# Patient Record
Sex: Male | Born: 1935
Health system: Southern US, Community
[De-identification: ages and names within clinical notes are randomized; demographics above are authoritative.]

## PROBLEM LIST (undated history)

## (undated) DIAGNOSIS — H9190 Unspecified hearing loss, unspecified ear: Secondary | ICD-10-CM

## (undated) DIAGNOSIS — K219 Gastro-esophageal reflux disease without esophagitis: Secondary | ICD-10-CM

## (undated) DIAGNOSIS — I10 Essential (primary) hypertension: Secondary | ICD-10-CM

## (undated) DIAGNOSIS — E7800 Pure hypercholesterolemia, unspecified: Secondary | ICD-10-CM

## (undated) DIAGNOSIS — R0602 Shortness of breath: Secondary | ICD-10-CM

## (undated) DIAGNOSIS — C61 Malignant neoplasm of prostate: Secondary | ICD-10-CM

## (undated) DIAGNOSIS — R42 Dizziness and giddiness: Secondary | ICD-10-CM

## (undated) DIAGNOSIS — I4891 Unspecified atrial fibrillation: Secondary | ICD-10-CM

## (undated) DIAGNOSIS — R0600 Dyspnea, unspecified: Secondary | ICD-10-CM

## (undated) DIAGNOSIS — K2270 Barrett's esophagus without dysplasia: Secondary | ICD-10-CM

## (undated) DIAGNOSIS — Z950 Presence of cardiac pacemaker: Secondary | ICD-10-CM

## (undated) HISTORY — DX: Dizziness and giddiness: R42

## (undated) HISTORY — DX: Unspecified atrial fibrillation: I48.91

## (undated) HISTORY — PX: INGUINAL HERNIA REPAIR: SUR1180

## (undated) HISTORY — DX: Unspecified hearing loss, unspecified ear: H91.90

## (undated) HISTORY — PX: CHOLECYSTECTOMY: SHX55

## (undated) HISTORY — DX: Dyspnea, unspecified: R06.00

## (undated) HISTORY — DX: Essential (primary) hypertension: I10

## (undated) HISTORY — DX: Gastro-esophageal reflux disease without esophagitis: K21.9

## (undated) HISTORY — DX: Barrett's esophagus without dysplasia: K22.70

## (undated) HISTORY — PX: PROSTATE BIOPSY: SHX241

## (undated) HISTORY — DX: Shortness of breath: R06.02

---

## 2000-09-11 DIAGNOSIS — C61 Malignant neoplasm of prostate: Secondary | ICD-10-CM

## 2000-09-11 HISTORY — DX: Malignant neoplasm of prostate: C61

## 2017-01-17 DIAGNOSIS — C61 Malignant neoplasm of prostate: Secondary | ICD-10-CM | POA: Diagnosis not present

## 2017-01-17 DIAGNOSIS — K4090 Unilateral inguinal hernia, without obstruction or gangrene, not specified as recurrent: Secondary | ICD-10-CM | POA: Diagnosis not present

## 2017-01-17 DIAGNOSIS — I10 Essential (primary) hypertension: Secondary | ICD-10-CM | POA: Diagnosis not present

## 2017-06-25 DIAGNOSIS — I10 Essential (primary) hypertension: Secondary | ICD-10-CM | POA: Diagnosis not present

## 2017-06-25 DIAGNOSIS — C61 Malignant neoplasm of prostate: Secondary | ICD-10-CM | POA: Diagnosis not present

## 2017-06-25 DIAGNOSIS — Z23 Encounter for immunization: Secondary | ICD-10-CM | POA: Diagnosis not present

## 2017-07-31 DIAGNOSIS — B029 Zoster without complications: Secondary | ICD-10-CM | POA: Diagnosis not present

## 2017-07-31 DIAGNOSIS — R21 Rash and other nonspecific skin eruption: Secondary | ICD-10-CM | POA: Diagnosis not present

## 2017-09-25 DIAGNOSIS — K219 Gastro-esophageal reflux disease without esophagitis: Secondary | ICD-10-CM | POA: Diagnosis not present

## 2017-09-25 DIAGNOSIS — Z7982 Long term (current) use of aspirin: Secondary | ICD-10-CM | POA: Diagnosis not present

## 2017-09-25 DIAGNOSIS — I517 Cardiomegaly: Secondary | ICD-10-CM | POA: Diagnosis not present

## 2017-09-25 DIAGNOSIS — I481 Persistent atrial fibrillation: Secondary | ICD-10-CM | POA: Diagnosis not present

## 2017-09-25 DIAGNOSIS — I10 Essential (primary) hypertension: Secondary | ICD-10-CM | POA: Diagnosis not present

## 2017-09-25 DIAGNOSIS — R0600 Dyspnea, unspecified: Secondary | ICD-10-CM | POA: Diagnosis not present

## 2017-09-25 DIAGNOSIS — Z79899 Other long term (current) drug therapy: Secondary | ICD-10-CM | POA: Diagnosis not present

## 2017-09-26 ENCOUNTER — Telehealth: Payer: Self-pay | Admitting: *Deleted

## 2017-09-26 NOTE — Telephone Encounter (Signed)
Pt's daughter called in today, [name], daughter sees Cecille Rubin, and explained that pt's father comes to live with daughter in the winter from [place] to [place]. Daughter travels in [place] with Husband and teaches ministry, read [employer] notes.  Father did not feel good and went to ER in [place] bp was 140/101 and HR 143.  Performed EKG and stated to have irregular HB and was started on Eliquis.Pt was to follow up with cardiology in one week.   Pt's daughter wanted pt to see Cecille Rubin next week, stated have no openings and did I suggest another provider and than daughter wanted pt to transition back to [place] after pt was seen next week.  S/w Julaine Hua and explained the situation stated could use two of Scott's slots.  Stated for pt to bring in d/c summary, copy of EKG, and all medications.   Appt made for 60 minutes and verified. Will route to Leisa Lenz, NP, Richardson Dopp, PA-C, Julaine Hua, CMA.

## 2017-09-26 NOTE — Telephone Encounter (Signed)
Ok

## 2017-09-26 NOTE — Telephone Encounter (Signed)
Ok.  Richardson Dopp, PA-C    09/26/2017 4:22 PM

## 2017-10-01 ENCOUNTER — Encounter: Payer: Self-pay | Admitting: *Deleted

## 2017-10-01 ENCOUNTER — Other Ambulatory Visit: Payer: Self-pay | Admitting: *Deleted

## 2017-10-01 NOTE — Progress Notes (Signed)
As I was reviewing my schedule for tomorrow 10/01/17, I saw there was no information entered into the chart for this New Pt.   I called and s/w pt and his daughter in regards to upcoming New Pt appt tomorrow 10/02/17 to help get as much information into the chart as possible before tomorrow's appt to help make the appt go smoother for both the pt and the provider. Pt asked for me to please s/w his daughter [name] who was there with the pt tonight.   I then s/w Baker Janus and gathered information as to the pt's health in the past as well as family hx, medications, etc. Baker Janus states the pt is currently taking TOPROL XL 50 MG BID, ELIQUIS 5 MG BID, PANTOPRAZOLE 40 MG DAILY, VITAMIN D3 2000 UNITS DAILY, RED YEAST RICE 600 MG DAILY. Pt's daughter does state she a copy of the recent EKG done and will bring that. She states pt was in the hospital , [facility] in [place]. I advised that we will get a ROI signed so that we may obtain records to help complete his chart the best we can.   I did confirm the daughters phone # from when she s/w Rainey Pines. CMA 09/26/17, which the chart had [phone], Baker Janus confirmed that her # is [phone]. I have corrected this today. Baker Janus was very thankful for the call today to help gather information to help make appt smoother tomorrow for both them and the provider.

## 2017-10-01 NOTE — Progress Notes (Signed)
Cardiology Office Note:    Date:  10/02/2017   ID:  Ernest Parker, DOB February 19, 1934, MRN [id number]  PCP:  Seward Carol, MD  Cardiologist:   [name], MD   Referring MD: Seward Carol, MD   Chief Complaint  Patient presents with  . Atrial Flutter    History of Present Illness:    Ernest Parker is a 84 y.o. male who is being seen today for the evaluation of Irregular cardiac rhythm and hypertension at the request of Seward Carol, MD.   The patient has a past medical history significant for hypertension, GERD and prostate cancer 2002.He denies any previous cardiac history including MI, heart failure, arrhythmias. He had a normal stress test over 20 years ago. Ernest Parker lives with his daughter in the winter and lives alone in [place] in the summer. His daughter, Ernest Parker, travels in an [place] with her husband and teaches ministry in [place]. She is moving to [place] next week. She is followed by Truitt Merle and requested to have her father seen in our clinic. Last week Ernest Parker was not feeling well and went to an ER in [place]. He was found to have an elevated BP of 140/101 and heart rate of 143. An EKG showed atrial flutter with RVR. He was given Lopressor IV and oral.  His rate was improved and he was discharged from the ED on Toprol 50 mg BID and Eliquis 5 mg BID with plans to follow up soon with cardiology. His symptoms resolved once his rate improved.   Ernest Parker is here today with his daughter. He reports that his symptoms with the episode of aflutter last week were dizziness, weakness, nausea and mild shortness of breath. He had no chest discomfort. He has never had this before. He felt better after treatment at the ED and has had no recurrence of those symptoms since then. His daughter has monitored his heart rate and BP several times a day. In review of her figures it looks like his rhythm likely converted on 10/01/17 as the rate was down from the 110's-130's to the 70's. His BP is  stable.   Ernest Parker is quite active. He plays golf 4-5 times per week in the warmer months. He denies any exertional chest discomfort or dyspnea. He drinks 1-2 cans of beer several times per week especially when he plays golf. He smoked in past, quit in 1980. He denies snoring, daytime sleepiness or previous symptoms of sleep apnea.   Records reviewed from [facility] in [place]. Initial EKG showed a narrow complex tachycardia at 137 bpm fairly regular. Once the rate slowed it revealed atrial flutter.    PAD Screen 10/02/2017  Previous PAD dx? No  Previous surgical procedure? No  Pain with walking? No  Feet/toe relief with dangling? No  Painful, non-healing ulcers? No  Extremities discolored? No      Past Medical History:  Diagnosis Date  . A-fib ([place])   . Cancer [facility]) 2002   prostate  . Dizzy   . Dyspnea   . GERD (gastroesophageal reflux disease)   . Hard of hearing    wears hearing aids bilat  . Hypertension   . SOB (shortness of breath)     Past Surgical History:  Procedure Laterality Date  . INGUINAL HERNIA REPAIR Bilateral     Current Medications: Current Meds  Medication Sig  . apixaban (ELIQUIS) 5 MG TABS tablet Take 1 tablet (5 mg total) by mouth 2 (two) times daily.  Marland Kitchen  aspirin EC 81 MG tablet Take 81 mg by mouth daily.  . Cholecalciferol (VITAMIN D3) 2000 units TABS Take 1 tablet by mouth daily.  . metoprolol succinate (TOPROL-XL) 50 MG 24 hr tablet Take 1 tablet (50 mg total) by mouth 2 (two) times daily. Take with or immediately following a meal.  . pantoprazole (PROTONIX) 40 MG tablet Take 40 mg by mouth daily.  . Red Yeast Rice 600 MG CAPS Take 1 capsule by mouth daily.     Allergies:   Penicillins   Social History   Socioeconomic History  . Marital status: Widowed    Spouse name: None  . Number of children: None  . Years of education: None  . Highest education level: None  Social Needs  . Financial resource strain: None  . Food  insecurity - worry: None  . Food insecurity - inability: None  . Transportation needs - medical: None  . Transportation needs - non-medical: None  Occupational History  . Occupation: RETIRED  Tobacco Use  . Smoking status: Former Smoker    Packs/day: 0.50    Years: 30.00    Pack years: 15.00    Types: Cigarettes    Last attempt to quit: 1980    Years since quitting: 39.0  . Smokeless tobacco: Never Used  Substance and Sexual Activity  . Alcohol use: Yes    Alcohol/week: 1.2 oz    Types: 2 Cans of beer per week    Comment: 2 BEERS on most days  . Drug use: None  . Sexual activity: None  Other Topics Concern  . None  Social History Narrative   Retired Freight forwarder for [employer], Engineer, agricultural. Wife passed away over 5 years ago. Lives alone during the summer in [place] and lives with daughter during the Winter in [place], moving to [place]. Has 1 daughter and 2 sons. One sone lives in [place] and one in [place].   Is an avid golfer, plays 4-5 days per week. Likes to do yardwork.      Family History: The patient's family history includes Hypertension in his mother; Kidney disease in his sister; Sleep apnea in his sister; Tuberculosis in his father. ROS:   Please see the history of present illness.     All other systems reviewed and are negative.  EKGs/Labs/Other Studies Reviewed:    The following studies were reviewed today:  EKG's from OSH  EKG:  EKG is ordered today.  The ekg ordered today demonstrates Sinus bradycardia with occasional PVC or possible PAC with aberancy.   Recent Labs: No results found for requested labs within last 8760 hours.   Recent Lipid Panel No results found for: CHOL, TRIG, HDL, CHOLHDL, VLDL, LDLCALC, LDLDIRECT  Physical Exam:    VS:  BP 112/62 (BP Location: Left Arm, Patient Position: Sitting, Cuff Size: Normal)   Pulse (!) 56   Ht 5\' 10"  (1.778 m)   Wt 229 lb 12.8 oz (104.2 kg)   SpO2 97%   BMI 32.97 kg/m     Wt Readings from Last 3 Encounters:    10/02/17 229 lb 12.8 oz (104.2 kg)     GEN:  Well nourished, well developed in no acute distress HEENT: Normal NECK: No JVD; No carotid bruits LYMPHATICS: No lymphadenopathy CARDIAC: Regularly irregular rhythm, no murmurs, rubs, gallops RESPIRATORY:  Clear to auscultation without rales, wheezing or rhonchi  ABDOMEN: Soft, non-tender, non-distended MUSCULOSKELETAL:  No edema; No deformity  SKIN: Warm and dry NEUROLOGIC:  Alert and oriented x 3 PSYCHIATRIC:  Normal affect   ASSESSMENT:    1. Atrial flutter, paroxysmal ([place])   2. Essential hypertension   3. Paroxysmal atrial fibrillation (HCC)    PLAN:    Pt was seen and examined by myself and discussed with Dr Lovena Le in clinic today. This patient's case was discussed in depth with Dr. Lovena Le. The plan below was formulated per our discussion.  In order of problems listed above:  Atrial flutter: No previous history of arrhythmia or cardiac issues. Developed symptomatic atrial flutter with RVR, seen at ED in [place] 09/25/2017. Started on toprol XL 50 mg bid and Eliquis. EKG now shows sinus bradycardia with PVCs vs PAC with aberrancy.  CHA2DS2/VAS Stroke Risk Score is at least 3 (HTN, Age (2)). On Eliquis 5 mg BID for stroke risk reduction. Will check echocardiogram for LV function, wall motion and valve function.  Plan to follow up with Dr. Lovena Le for EP consult in 3 weeks. Discussed symptoms to report. No evidence of sleep apnea.       HTN: Micardis was stopped to make room for BB to control atrial flutter. BP stable.   Medication Adjustments/Labs and Tests Ordered: Current medicines are reviewed at length with the patient today.  Concerns regarding medicines are outlined above. Labs and tests ordered and medication changes are outlined in the patient instructions below:  Patient Instructions  Medication Instructions:  Your physician recommends that you continue on your current medications as directed. Please refer to the Current  Medication list given to you today.  REFILLS HAVE BEEN SENT IN FOR METOPROLOL AND ELIQUIS IN TO YOUR PHARMACY.  Labwork: TODAY: BMET  Testing/Procedures: Your physician has requested that you have an echocardiogram. Echocardiography is a painless test that uses sound waves to create images of your heart. It provides your doctor with information about the size and Parker of your heart and how well your heart's chambers and valves are working. This procedure takes approximately one hour. There are no restrictions for this procedure.    Follow-Up: Your physician recommends that you schedule a CONSULTATION WITH DR. Lovena Le IN 3 WEEKS   Any Other Special Instructions Will Be Listed Below (If Applicable).     If you need a refill on your cardiac medications before your next appointment, please call your pharmacy.      Signed, Daune Perch, NP  10/02/2017 2:44 PM    [facility]

## 2017-10-01 NOTE — Telephone Encounter (Signed)
I prepped the chart tonight.

## 2017-10-02 ENCOUNTER — Encounter (INDEPENDENT_AMBULATORY_CARE_PROVIDER_SITE_OTHER): Payer: Self-pay

## 2017-10-02 ENCOUNTER — Ambulatory Visit (INDEPENDENT_AMBULATORY_CARE_PROVIDER_SITE_OTHER): Payer: Medicare Other | Admitting: Cardiology

## 2017-10-02 VITALS — BP 112/62 | HR 56 | Ht 70.0 in | Wt 229.8 lb

## 2017-10-02 DIAGNOSIS — I480 Paroxysmal atrial fibrillation: Secondary | ICD-10-CM | POA: Diagnosis not present

## 2017-10-02 DIAGNOSIS — I4892 Unspecified atrial flutter: Secondary | ICD-10-CM

## 2017-10-02 DIAGNOSIS — I10 Essential (primary) hypertension: Secondary | ICD-10-CM | POA: Diagnosis not present

## 2017-10-02 DIAGNOSIS — I4891 Unspecified atrial fibrillation: Secondary | ICD-10-CM | POA: Insufficient documentation

## 2017-10-02 MED ORDER — METOPROLOL SUCCINATE ER 50 MG PO TB24: 50.0000 mg | ORAL_TABLET | Freq: Two times a day (BID) | ORAL | 3 refills | Status: DC

## 2017-10-02 MED ORDER — APIXABAN 5 MG PO TABS: 5.0000 mg | ORAL_TABLET | Freq: Two times a day (BID) | ORAL | 3 refills | Status: DC

## 2017-10-02 NOTE — Patient Instructions (Signed)
Medication Instructions:  Your physician recommends that you continue on your current medications as directed. Please refer to the Current Medication list given to you today.  REFILLS HAVE BEEN SENT IN FOR METOPROLOL AND ELIQUIS IN TO YOUR PHARMACY.  Labwork: TODAY: BMET  Testing/Procedures: Your physician has requested that you have an echocardiogram. Echocardiography is a painless test that uses sound waves to create images of your heart. It provides your doctor with information about the size and shape of your heart and how well your heart's chambers and valves are working. This procedure takes approximately one hour. There are no restrictions for this procedure.    Follow-Up: Your physician recommends that you schedule a CONSULTATION WITH DR. Lovena Le IN 3 WEEKS   Any Other Special Instructions Will Be Listed Below (If Applicable).     If you need a refill on your cardiac medications before your next appointment, please call your pharmacy.

## 2017-10-03 ENCOUNTER — Telehealth: Payer: Self-pay | Admitting: *Deleted

## 2017-10-03 DIAGNOSIS — I10 Essential (primary) hypertension: Secondary | ICD-10-CM

## 2017-10-03 DIAGNOSIS — I480 Paroxysmal atrial fibrillation: Secondary | ICD-10-CM

## 2017-10-03 LAB — BASIC METABOLIC PANEL
BUN / CREAT RATIO: 14 (ref 10–24)
BUN: 29 mg/dL — ABNORMAL HIGH (ref 8–27)
CHLORIDE: 99 mmol/L (ref 96–106)
CO2: 22 mmol/L (ref 20–29)
Calcium: 9.6 mg/dL (ref 8.6–10.2)
Creatinine, Ser: 2.03 mg/dL — ABNORMAL HIGH (ref 0.76–1.27)
GFR, EST AFRICAN AMERICAN: 34 mL/min/{1.73_m2} — AB (ref >59)
GFR, EST NON AFRICAN AMERICAN: 30 mL/min/{1.73_m2} — AB (ref >59)
Glucose: 119 mg/dL — ABNORMAL HIGH (ref 65–99)
Potassium: 5 mmol/L (ref 3.5–5.2)
SODIUM: 138 mmol/L (ref 134–144)

## 2017-10-03 MED ORDER — APIXABAN 2.5 MG PO TABS: 2.5000 mg | ORAL_TABLET | Freq: Two times a day (BID) | ORAL | 0 refills | Status: DC

## 2017-10-03 NOTE — Telephone Encounter (Signed)
DPR ok to s/w pt's daughter [name]. Baker Janus has been notified of pt's lab results as well as dose change in Eliquis to 2.5 mg BID. Baker Janus states they are in [place] packing up the pt's belongings to move back to [facility]. She asked for Rx to be [facility] [place], [place] [phone]. I called the pharmacy and the s/w pharmacist who asked for me to put Rx info on his VM. I left Rx info and if any questions to please call [phone]. BMET to be done 10/10/17 when pt comes in for echo. Baker Janus thanked me for my call and our help. Records from the [facility] were received yesterday for pt appt and given to Pecolia Ades, NP for her review while seeing the pt.

## 2017-10-03 NOTE — Telephone Encounter (Signed)
-----   Message from Liliane Shi, PA-C sent at 10/03/2017  5:35 PM EST ----- Labs reviewed for Ernest Perch, NP  Creatinine elevated at 2.03.  Potassium normal. Previous renal function unknown. PLAN: 1.  Decrease Eliquis to 2.5 mg twice daily 2.  Request labs from hospital where he was seen in [place] earlier this month. 3.  BMET 1 week Richardson Dopp, PA-C 10/03/2017 5:35 PM

## 2017-10-10 ENCOUNTER — Ambulatory Visit (HOSPITAL_COMMUNITY): Payer: Medicare Other | Attending: Cardiology

## 2017-10-10 ENCOUNTER — Other Ambulatory Visit: Payer: Medicare Other | Admitting: *Deleted

## 2017-10-10 ENCOUNTER — Other Ambulatory Visit: Payer: Self-pay

## 2017-10-10 DIAGNOSIS — I4891 Unspecified atrial fibrillation: Secondary | ICD-10-CM | POA: Insufficient documentation

## 2017-10-10 DIAGNOSIS — I480 Paroxysmal atrial fibrillation: Secondary | ICD-10-CM

## 2017-10-10 DIAGNOSIS — I10 Essential (primary) hypertension: Secondary | ICD-10-CM

## 2017-10-10 DIAGNOSIS — I361 Nonrheumatic tricuspid (valve) insufficiency: Secondary | ICD-10-CM | POA: Diagnosis not present

## 2017-10-10 DIAGNOSIS — I4892 Unspecified atrial flutter: Secondary | ICD-10-CM | POA: Diagnosis not present

## 2017-10-10 LAB — BASIC METABOLIC PANEL
BUN/Creatinine Ratio: 12 (ref 10–24)
BUN: 20 mg/dL (ref 8–27)
CALCIUM: 9 mg/dL (ref 8.6–10.2)
CHLORIDE: 104 mmol/L (ref 96–106)
CO2: 22 mmol/L (ref 20–29)
Creatinine, Ser: 1.64 mg/dL — ABNORMAL HIGH (ref 0.76–1.27)
GFR calc non Af Amer: 38 mL/min/{1.73_m2} — ABNORMAL LOW (ref >59)
GFR, EST AFRICAN AMERICAN: 44 mL/min/{1.73_m2} — AB (ref >59)
GLUCOSE: 97 mg/dL (ref 65–99)
POTASSIUM: 4.8 mmol/L (ref 3.5–5.2)
Sodium: 141 mmol/L (ref 134–144)

## 2017-10-15 ENCOUNTER — Telehealth: Payer: Self-pay | Admitting: Physician Assistant

## 2017-10-15 ENCOUNTER — Other Ambulatory Visit: Payer: Self-pay

## 2017-10-15 ENCOUNTER — Emergency Department (HOSPITAL_COMMUNITY)
Admission: EM | Admit: 2017-10-15 | Discharge: 2017-10-15 | Disposition: A | Discharge status: Home / Self Care | Payer: Medicare Other | Attending: Emergency Medicine | Admitting: Emergency Medicine

## 2017-10-15 ENCOUNTER — Emergency Department (HOSPITAL_COMMUNITY): Payer: Medicare Other

## 2017-10-15 ENCOUNTER — Encounter (HOSPITAL_COMMUNITY): Payer: Self-pay

## 2017-10-15 DIAGNOSIS — Z5321 Procedure and treatment not carried out due to patient leaving prior to being seen by health care provider: Secondary | ICD-10-CM | POA: Diagnosis not present

## 2017-10-15 DIAGNOSIS — H532 Diplopia: Secondary | ICD-10-CM | POA: Diagnosis not present

## 2017-10-15 DIAGNOSIS — R51 Headache: Secondary | ICD-10-CM | POA: Diagnosis not present

## 2017-10-15 DIAGNOSIS — M6281 Muscle weakness (generalized): Secondary | ICD-10-CM | POA: Insufficient documentation

## 2017-10-15 LAB — I-STAT CHEM 8, ED
BUN: 21 mg/dL — AB (ref 6–20)
CHLORIDE: 104 mmol/L (ref 101–111)
CREATININE: 1.6 mg/dL — AB (ref 0.61–1.24)
Calcium, Ion: 1.2 mmol/L (ref 1.15–1.40)
Glucose, Bld: 100 mg/dL — ABNORMAL HIGH (ref 65–99)
HEMATOCRIT: 36 % — AB (ref 39.0–52.0)
HEMOGLOBIN: 12.2 g/dL — AB (ref 13.0–17.0)
POTASSIUM: 4.4 mmol/L (ref 3.5–5.1)
Sodium: 140 mmol/L (ref 135–145)
TCO2: 26 mmol/L (ref 22–32)

## 2017-10-15 LAB — CBC
HCT: 36.9 % — ABNORMAL LOW (ref 39.0–52.0)
HEMOGLOBIN: 11.8 g/dL — AB (ref 13.0–17.0)
MCH: 29.9 pg (ref 26.0–34.0)
MCHC: 32 g/dL (ref 30.0–36.0)
MCV: 93.4 fL (ref 78.0–100.0)
Platelets: 264 10*3/uL (ref 150–400)
RBC: 3.95 MIL/uL — ABNORMAL LOW (ref 4.22–5.81)
RDW: 14.4 % (ref 11.5–15.5)
WBC: 8.6 10*3/uL (ref 4.0–10.5)

## 2017-10-15 LAB — COMPREHENSIVE METABOLIC PANEL
ALBUMIN: 3.1 g/dL — AB (ref 3.5–5.0)
ALT: 25 U/L (ref 17–63)
AST: 20 U/L (ref 15–41)
Alkaline Phosphatase: 86 U/L (ref 38–126)
Anion gap: 10 (ref 5–15)
BUN: 20 mg/dL (ref 6–20)
CHLORIDE: 104 mmol/L (ref 101–111)
CO2: 24 mmol/L (ref 22–32)
Calcium: 9 mg/dL (ref 8.9–10.3)
Creatinine, Ser: 1.68 mg/dL — ABNORMAL HIGH (ref 0.61–1.24)
GFR calc Af Amer: 42 mL/min — ABNORMAL LOW (ref >60)
GFR calc non Af Amer: 36 mL/min — ABNORMAL LOW (ref >60)
GLUCOSE: 100 mg/dL — AB (ref 65–99)
POTASSIUM: 4.4 mmol/L (ref 3.5–5.1)
Sodium: 138 mmol/L (ref 135–145)
Total Bilirubin: 0.7 mg/dL (ref 0.3–1.2)
Total Protein: 6.8 g/dL (ref 6.5–8.1)

## 2017-10-15 LAB — DIFFERENTIAL
BASOS PCT: 1 %
Basophils Absolute: 0.1 10*3/uL (ref 0.0–0.1)
EOS ABS: 0.6 10*3/uL (ref 0.0–0.7)
Eosinophils Relative: 7 %
LYMPHS ABS: 2 10*3/uL (ref 0.7–4.0)
Lymphocytes Relative: 23 %
Monocytes Absolute: 0.6 10*3/uL (ref 0.1–1.0)
Monocytes Relative: 8 %
NEUTROS ABS: 5.3 10*3/uL (ref 1.7–7.7)
NEUTROS PCT: 61 %

## 2017-10-15 LAB — I-STAT TROPONIN, ED: Troponin i, poc: 0.02 ng/mL (ref 0.00–0.08)

## 2017-10-15 LAB — PROTIME-INR
INR: 1.17
Prothrombin Time: 14.8 seconds (ref 11.4–15.2)

## 2017-10-15 LAB — APTT: APTT: 35 s (ref 24–36)

## 2017-10-15 NOTE — Telephone Encounter (Signed)
Pt's daughter Ernest Parker called earlier today. See earlier phone note from today. Ernest Parker states her father replaced a door lock on his home on Saturday. Pt later that day laid down and then woke up to take his medications when he found he could not move his right leg or his right arm. Ernest Parker states pt said he took hi medications using his left hand. Pt sat back down with symptoms lasting about 3-4 minutes per pt. Pt denies any chest pain, sob, dizziness, blurriness, HA, N/V. I asked if pt was sleeping on his side which may have caused his symptoms. Pt answered to his daughter it did not feel like that. Pt wonders if he may have possibly had a stroke. I asked Ernest Parker if they had also contacted PCP, which she answered no not at this time. Pt has appt with Dr. Lovena Le 10/23/17. I d/w Sena Hitch will d/w Richardson Dopp, PAC as to his insight on will call back later today. Ernest Parker thanked me for my call. I did confirm pt is doing fine at this time and offers no complaints. Ernest Parker did give a BP reading on 121/89 HR 106 this was on Sunday about 10 am.   I did d/w Pecolia Ades, NP in regards pt c/o. Gae Bon then s/w daughter Ernest Parker and gave recommendation to go to ED for further evaluation and to r/o stroke or TIA. Ernest Parker is agreement to this for the pt.

## 2017-10-15 NOTE — Telephone Encounter (Signed)
New Message   Patients daughter [name] is calling in reference to some concerns with her father. She states that on Saturday morning he woke up complaining that he could not lift his right arm. Today he is okay but she would like to discuss. Please call.

## 2017-10-15 NOTE — ED Triage Notes (Signed)
Per Pt and family, Pt is coming from home with complaints of an episode of right arm weakness that took place on Saturday for aproximately four or five minutes. Pt reports that he got up to get his medicine when his right leg was weak. When he made it to his medicine he was unable to pick up his medicine with his right arm. He laid back down and when he woke up on Sunday, he did not have the symptoms. Denies any blurred vision, double vision, unilateral weakness, headache, aphasia. Since this episode on Saturday.

## 2017-10-15 NOTE — ED Notes (Signed)
Pt's family to the desk requesting an update and stating they want to leave and have pt's cardiologist review results tomorrow. Advised pt and family to stay.

## 2017-10-15 NOTE — Telephone Encounter (Signed)
I spoke with [name] daughter on speaker phone so that he could hear. She said that the patient had transient right arm and leg heaviness on Saturday evening after he had been resting on bed watching TV. He had no tingling and no other deficits. This resovled in a few minutes. No recurrence of any similar symptoms. His daughter took his Bp on Sunday morning, SBP around 100 and heart rate 105.   I discussed the increased risk of stroke with afib/flutter. He has been taking his anticoagulation. I advised the patient to go to the ED to be evaluated for possible stroke. Also his cardiac rhythm could be evaluated. The patient and his daughter agree and he will be taken to the [facility] ED.   Daune Perch, AGNP-C [facility] 10/15/2017  2:12 PM

## 2017-10-16 ENCOUNTER — Telehealth: Payer: Self-pay | Admitting: *Deleted

## 2017-10-16 ENCOUNTER — Other Ambulatory Visit: Payer: Self-pay | Admitting: *Deleted

## 2017-10-16 DIAGNOSIS — G459 Transient cerebral ischemic attack, unspecified: Secondary | ICD-10-CM

## 2017-10-16 NOTE — Telephone Encounter (Signed)
Unfortunately pt not seen in ED. Pt had symptoms of TIA on Saturday without recurrence. CT of the head in the ED showed lacunar infarct in the left basal ganglia of indeterminate age. No hemorrhage. Continue current meds.  Have placed a neurology consult for further evaluation. Hopefully can be done within the next week.   Daune Perch, AGNP-C [facility] 10/16/2017  1:54 PM

## 2017-10-16 NOTE — Telephone Encounter (Signed)
Pt's daughter called in to state was on phone system for 4 hours yesterday and was told to bring pt to ER.  Stated sent to ER.  Did blood work and EKG and sat pt out in lobby for 7 hours left AMA stated if something was wrong would have brought pt back to be admitted.  Stated was going to call cardiology office this am.  Stated called office and still could not get through, stated asked to s/w nurse and stated gets thrown back into a loop of waiting.  Pt's daughter did have direct line and called. Will send to appropriate place.  Daune Perch, [place], saw pt last time in office.

## 2017-10-16 NOTE — Telephone Encounter (Signed)
S/w pt daughter explained to keep appt with Dr. Lovena Le and a referral has been placed in system for Neurology.  Stated EKG was in normal sinus rhythm and pt showed tia symptoms and CT showed Infarct of unknown age.  Stated to stay on all medications including ASA and eliquis, stated Daune Perch, [place], took pt off of ASA.  Stated would call back if Janine wanted to put pt back on ASA.  Will send to [facility] advise.

## 2017-10-17 ENCOUNTER — Encounter: Payer: Self-pay | Admitting: Neurology

## 2017-10-17 ENCOUNTER — Encounter: Payer: Self-pay | Admitting: Internal Medicine

## 2017-10-23 ENCOUNTER — Ambulatory Visit (INDEPENDENT_AMBULATORY_CARE_PROVIDER_SITE_OTHER): Payer: Medicare Other | Admitting: Internal Medicine

## 2017-10-23 ENCOUNTER — Encounter: Payer: Self-pay | Admitting: Internal Medicine

## 2017-10-23 VITALS — BP 126/72 | HR 46 | Ht 70.0 in | Wt 226.0 lb

## 2017-10-23 DIAGNOSIS — I480 Paroxysmal atrial fibrillation: Secondary | ICD-10-CM

## 2017-10-23 DIAGNOSIS — I10 Essential (primary) hypertension: Secondary | ICD-10-CM

## 2017-10-23 DIAGNOSIS — I4892 Unspecified atrial flutter: Secondary | ICD-10-CM | POA: Diagnosis not present

## 2017-10-23 MED ORDER — METOPROLOL SUCCINATE ER 50 MG PO TB24: 50.0000 mg | ORAL_TABLET | Freq: Two times a day (BID) | ORAL | 3 refills | Status: DC

## 2017-10-23 MED ORDER — APIXABAN 2.5 MG PO TABS: 2.5000 mg | ORAL_TABLET | Freq: Two times a day (BID) | ORAL | 11 refills | Status: DC

## 2017-10-23 NOTE — Progress Notes (Signed)
HPI Ernest Parker is referred today by Pecolia Ades for evaluation of atrial flutter. The patient has had a h/o palpitations in the past and sought medical attention while he was down in [place]. He was found to have atrial flutter and was placed on a beta blocker and Eliquis. His dose of Eliquis was down titrated to 2.5 twice a day based on his renal function. He is referred for evaluation. He denies chest pain or sob. He notes an episode of right arm and leg weakness. Subsequent eval with a head CT was unremarkable. No symptoms since he is not sure if he has had recurrent palpitations.  Allergies  Allergen Reactions  . Penicillins Other (See Comments)    Patient can't remember reaction was told by mother was allergic as a child       Current Outpatient Medications  Medication Sig Dispense Refill  . apixaban (ELIQUIS) 2.5 MG TABS tablet Take 1 tablet (2.5 mg total) by mouth 2 (two) times daily. 60 tablet 11  . Cholecalciferol (VITAMIN D3) 2000 units TABS Take 2,000 Units by mouth daily.     . metoprolol succinate (TOPROL-XL) 50 MG 24 hr tablet Take 1 tablet (50 mg total) by mouth 2 (two) times daily. Take with or immediately following a meal. 90 tablet 3  . pantoprazole (PROTONIX) 40 MG tablet Take 40 mg by mouth daily.  0  . Red Yeast Rice 600 MG CAPS Take 1 capsule by mouth daily.     No current facility-administered medications for this visit.      Past Medical History:  Diagnosis Date  . A-fib ([place])   . Cancer [facility]) 2002   prostate  . Dizzy   . Dyspnea   . GERD (gastroesophageal reflux disease)   . Hard of hearing    wears hearing aids bilat  . Hypertension   . SOB (shortness of breath)     ROS:   All systems reviewed and negative except as noted in the HPI.   Past Surgical History:  Procedure Laterality Date  . INGUINAL HERNIA REPAIR Bilateral      Family History  Problem Relation Age of Onset  . Hypertension Mother   . Tuberculosis Father   . Kidney  disease Sister   . Sleep apnea Sister      Social History   Socioeconomic History  . Marital status: Widowed    Spouse name: Not on file  . Number of children: Not on file  . Years of education: Not on file  . Highest education level: Not on file  Social Needs  . Financial resource strain: Not on file  . Food insecurity - worry: Not on file  . Food insecurity - inability: Not on file  . Transportation needs - medical: Not on file  . Transportation needs - non-medical: Not on file  Occupational History  . Occupation: RETIRED  Tobacco Use  . Smoking status: Former Smoker    Packs/day: 0.50    Years: 30.00    Pack years: 15.00    Types: Cigarettes    Last attempt to quit: 1980    Years since quitting: 39.1  . Smokeless tobacco: Never Used  Substance and Sexual Activity  . Alcohol use: Yes    Alcohol/week: 1.2 oz    Types: 2 Cans of beer per week    Comment: 2 BEERS on most days  . Drug use: No  . Sexual activity: Not on file  Other Topics Concern  .  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 Lives alone during the summer in [place] and lives with daughter during the Winter in [place], moving to [place]. Has 1 daughter and 2 sons. One sone lives in [place] and one in [place].   Is an avid golfer, plays 4-5 days per week. Likes to do yardwork.      BP 126/72   Pulse (!) 46   Ht 5\' 10"  (1.778 m)   Wt 226 lb (102.5 kg)   SpO2 96%   BMI 32.43 kg/m   Physical Exam:  Well appearing 84 yo man, NAD HEENT: Unremarkable Neck:  6 cm JVD, no thyromegally Lymphatics:  No adenopathy Back:  No CVA tenderness Lungs:  Clear with no wheezes HEART:  Regular rate rhythm, no murmurs, no rubs, no clicks Abd:  soft, positive bowel sounds, no organomegally, no rebound, no guarding Ext:  2 plus pulses, no edema, no cyanosis, no clubbing Skin:  No rashes no nodules Neuro:  CN II through XII intact, motor grossly  intact  EKG - nsr   Assess/Plan: 1. Atrial flutter - I have reviewed the ECG from January which shows 2:1 and variable AV conduction of typical atrial flutter. He will continue his current meds. I suspect he will have more flutter and if he does we would consider catheter ablation. 2. Atrial fib - his chart references this but we have no strips of atrial fib. With his h/o flutter, he is also likely to have atrial fib.  3. Arm/leg weakness - unclear if this represented a TIA. Workup negative. Neuro evaluation is pending. I considered switching his Dwight but his dose of eliquis is appropriate based on his age and renal function.   Mikle Bosworth.D.

## 2017-10-23 NOTE — Patient Instructions (Addendum)
Medication Instructions:  Your physician recommends that you continue on your current medications as directed. Please refer to the Current Medication list given to you today.  Labwork: None ordered.  Testing/Procedures: Your physician has recommended that you wear an event monitor. Event monitors are medical devices that record the heart's electrical activity. Doctors most often [place] these monitors to diagnose arrhythmias. Arrhythmias are problems with the speed or rhythm of the heartbeat. The monitor is a small, portable device. You can wear one while you do your normal daily activities. This is usually used to diagnose what is causing palpitations/syncope (passing out).  Please schedule for an event monitor.  Follow-Up: Your physician wants you to follow-up in: early April with Dr. Lovena Le.   Any Other Special Instructions Will Be Listed Below (If Applicable).  If you need a refill on your cardiac medications before your next appointment, please call your pharmacy.   Cardiac Event Monitoring A cardiac event monitor is a small recording device that is used to detect abnormal heart rhythms (arrhythmias). The monitor is used to record your heart rhythm when you have symptoms, such as:  Fast heartbeats (palpitations), such as heart racing or fluttering.  Dizziness.  Fainting or light-headedness.  Unexplained weakness.  Some monitors are wired to electrodes placed on your chest. Electrodes are flat, sticky disks that attach to your skin. Other monitors may be hand-held or worn on the wrist. The monitor can be worn for up to 30 days. If the monitor is attached to your chest, a technician will prepare your chest for the electrode placement and show you how to work the monitor. Take time to practice using the monitor before you leave the office. Make sure you understand how to send the information from the monitor to your health care provider. In some cases, you may need to use a landline  telephone instead of a cell phone. What are the risks? Generally, this device is safe to use, but it possible that the skin under the electrodes will become irritated. How to use your cardiac event monitor  Wear your monitor at all times, except when you are in water: ? Do not let the monitor get wet. ? Take the monitor off when you bathe. Do not swim or use a hot tub with it on.  Keep your skin clean. Do not put body lotion or moisturizer on your chest.  Change the electrodes as told by your health care provider or any time they stop sticking to your skin. You may need to use medical tape to keep them on.  Try to put the electrodes in slightly different places on your chest to help prevent skin irritation. They must remain in the area under your left breast and in the upper right section of your chest.  Make sure the monitor is safely clipped to your clothing or in a location close to your body that your health care provider recommends.  Press the button to record as soon as you feel heart-related symptoms, such as: ? Dizziness. ? Weakness. ? Light-headedness. ? Palpitations. ? Thumping or pounding in your chest. ? Shortness of breath. ? Unexplained weakness.  Keep a diary of your activities, such as walking, doing chores, and taking medicine. It is very important to note what you were doing when you pushed the button to record your symptoms. This will help your health care provider determine what might be contributing to your symptoms.  Send the recorded information as recommended by your health care provider.  It may take some time for your health care provider to process the results.  Change the batteries as told by your health care provider.  Keep electronic devices away from your monitor. This includes: ? Tablets. ? MP3 players. ? Cell phones.  While wearing your monitor you should avoid: ? Electric blankets. ? Armed forces operational officer. ? Electric toothbrushes. ? Microwave  ovens. ? Magnets. ? Metal detectors. Get help right away if:  You have chest pain.  You have extreme difficulty breathing or shortness of breath.  You develop a very fast heartbeat that persists.  You develop dizziness that does not go away.  You faint or constantly feel like you are about to faint. Summary  A cardiac event monitor is a small recording device that is used to help detect abnormal heart rhythms (arrhythmias).  The monitor is used to record your heart rhythm when you have heart-related symptoms.  Make sure you understand how to send the information from the monitor to your health care provider.  It is important to press the button on the monitor when you have any heart-related symptoms.  Keep a diary of your activities, such as walking, doing chores, and taking medicine. It is very important to note what you were doing when you pushed the button to record your symptoms. This will help your health care provider learn what might be causing your symptoms. This information is not intended to replace advice given to you by your health care provider. Make sure you discuss any questions you have with your health care provider. Document Released: 06/06/2008 Document Revised: 08/12/2016 Document Reviewed: 08/12/2016 Elsevier Interactive Patient Education  2017 [employer].

## 2017-10-31 ENCOUNTER — Telehealth: Payer: Self-pay | Admitting: Internal Medicine

## 2017-10-31 ENCOUNTER — Ambulatory Visit (INDEPENDENT_AMBULATORY_CARE_PROVIDER_SITE_OTHER): Payer: Medicare Other

## 2017-10-31 DIAGNOSIS — I480 Paroxysmal atrial fibrillation: Secondary | ICD-10-CM

## 2017-10-31 DIAGNOSIS — I4892 Unspecified atrial flutter: Secondary | ICD-10-CM | POA: Diagnosis not present

## 2017-10-31 NOTE — Telephone Encounter (Signed)
Consulted DOD, Dr. Angelena Form, he recommends Dr. Lovena Le to review. Will put report in Dr. Tanna Furry box to review and sign. Will send message to Dr. Lovena Le, so he is aware.

## 2017-10-31 NOTE — Telephone Encounter (Signed)
New message    [facility] calling with EKG result.

## 2017-10-31 NOTE — Telephone Encounter (Signed)
[facility] called about patient's monitor. They reported at 11:23 ET today, patient was Atrial Fib with heart rate 110 to 120 bpm. Patient was called and reports no symptoms.

## 2017-11-02 NOTE — Telephone Encounter (Signed)
Per Dr. Lovena Le- Pt should continue to take Toprol and Eliquis.  Pt has follow up scheduled with Dr. Lovena Le in April.  No further action at this time.  Strips reviewed by Dr. Lovena Le.

## 2017-11-08 ENCOUNTER — Ambulatory Visit (INDEPENDENT_AMBULATORY_CARE_PROVIDER_SITE_OTHER): Payer: Medicare Other | Admitting: Neurology

## 2017-11-08 ENCOUNTER — Encounter: Payer: Self-pay | Admitting: Neurology

## 2017-11-08 ENCOUNTER — Other Ambulatory Visit: Payer: Self-pay

## 2017-11-08 ENCOUNTER — Other Ambulatory Visit: Payer: Medicare Other

## 2017-11-08 VITALS — BP 138/76 | HR 53 | Ht 70.0 in | Wt 226.0 lb

## 2017-11-08 DIAGNOSIS — G459 Transient cerebral ischemic attack, unspecified: Secondary | ICD-10-CM

## 2017-11-08 DIAGNOSIS — Z8546 Personal history of malignant neoplasm of prostate: Secondary | ICD-10-CM

## 2017-11-08 DIAGNOSIS — I480 Paroxysmal atrial fibrillation: Secondary | ICD-10-CM

## 2017-11-08 DIAGNOSIS — R739 Hyperglycemia, unspecified: Secondary | ICD-10-CM | POA: Diagnosis not present

## 2017-11-08 DIAGNOSIS — I639 Cerebral infarction, unspecified: Secondary | ICD-10-CM | POA: Diagnosis not present

## 2017-11-08 NOTE — Progress Notes (Signed)
NEUROLOGY CONSULTATION NOTE  Ernest Parker MRN: [id number] DOB: 1934-01-14  Referring provider: Daune Perch, NP Primary care provider: Dr. Seward Carol  Reason for consult:  TIA  Dear Dr Lovena Le:  Thank you for your kind referral of Ernest Parker for consultation of the above symptoms. Although his history is well known to you, please allow me to reiterate it for the purpose of our medical record. The patient was accompanied to the clinic by his daughter who also provides collateral information. Records and images were personally reviewed where available.   HISTORY OF PRESENT ILLNESS: This is a pleasant 84 year old right-handed man with a history of prostate cancer, hypertension, atrial fibrillation, presenting for evaluation of a transient episode of right-sided weakness last 10/13/2017. He was was at home and resting on the bed when all of a sudden his right hand "felt dead." He tried to stand and his right leg was shaky like he could not put weight on it. There was no numbness or tingling, vision changes, headache, or dizziness. He denies any falls. After a couple of minutes, he was back to baseline. His daughter took his BP the next morning, SBP was around 100 and heart rate was 105. He was advised to go to the ER, but left AMA after having a head CT done. I personally reviewed head CT without contrast done 10/15/17 which showed an ovoid 37mm hypodensity in the left lentiform nucleus, concerning for a lacunar infarct of indeterminate age. He denies any further similar symptoms since then. He had started Eliquis just 2-3 weeks prior to the event, he recalls [place] in [place] and feeling diffusely weak for 3-4 minutes and his heart rate was extremely high. His dose was reduced the week prior to the episode for renal dosing. He is taking Eliquis 2.5mg  BID and denies missing medication. He denies any similar focal weakness in the past, but has had a couple of episodes last summer  while playing golf where he felt "very weak like jello." He has occasional lightheadedness when he bends down lasting 2-3 seconds. He denies any diplopia, dysarthria, neck/back pain, bowel/bladder dysfunction. He denies any prior history of stroke. His maternal grandmother has a history of stroke.    PAST MEDICAL HISTORY: Past Medical History:  Diagnosis Date  . A-fib ([place])   . Cancer [facility]) 2002   prostate  . Dizzy   . Dyspnea   . GERD (gastroesophageal reflux disease)   . Hard of hearing    wears hearing aids bilat  . Hypertension   . SOB (shortness of breath)     PAST SURGICAL HISTORY: Past Surgical History:  Procedure Laterality Date  . INGUINAL HERNIA REPAIR Bilateral     MEDICATIONS: Current Outpatient Medications on File Prior to Visit  Medication Sig Dispense Refill  . apixaban (ELIQUIS) 2.5 MG TABS tablet Take 1 tablet (2.5 mg total) by mouth 2 (two) times daily. 60 tablet 11  . Cholecalciferol (VITAMIN D3) 2000 units TABS Take 2,000 Units by mouth daily.     . metoprolol succinate (TOPROL-XL) 50 MG 24 hr tablet Take 1 tablet (50 mg total) by mouth 2 (two) times daily. Take with or immediately following a meal. 90 tablet 3  . pantoprazole (PROTONIX) 40 MG tablet Take 40 mg by mouth daily.  0  . Red Yeast Rice 600 MG CAPS Take 1 capsule by mouth daily.     No current facility-administered medications on file prior to visit.  ALLERGIES: Allergies  Allergen Reactions  . Penicillins Other (See Comments)    Patient can't remember reaction was told by mother was allergic as a child      FAMILY HISTORY: Family History  Problem Relation Age of Onset  . Hypertension Mother   . Tuberculosis Father   . Kidney disease Sister   . Sleep apnea Sister     SOCIAL HISTORY: Social History   Socioeconomic History  . Marital status: Widowed    Spouse name: Not on file  . Number of children: Not on file  . Years of education: Not on file  . Highest education level:  Not on file  Social Needs  . Financial resource strain: Not on file  . Food insecurity - worry: Not on file  . Food insecurity - inability: Not on file  . Transportation needs - medical: Not on file  . Transportation needs - non-medical: Not on file  Occupational History  . Occupation: RETIRED  Tobacco Use  . Smoking status: Former Smoker    Packs/day: 0.50    Years: 30.00    Pack years: 15.00    Types: Cigarettes    Last attempt to quit: 1980    Years since quitting: 39.1  . Smokeless tobacco: Never Used  Substance and Sexual Activity  . Alcohol use: Yes    Alcohol/week: 1.2 oz    Types: 2 Cans of beer per week    Comment: 2 BEERS on most days  . Drug use: No  . Sexual activity: Not on file  Other Topics Concern  . 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 Lives alone during the summer in [place] and lives with daughter during the Winter in [place], moving to [place]. Has 1 daughter and 2 sons. One sone lives in [place] and one in [place].   Is an avid golfer, plays 4-5 days per week. Likes to do yardwork.     REVIEW OF SYSTEMS: Constitutional: No fevers, chills, or sweats, no generalized fatigue, change in appetite Eyes: No visual changes, double vision, eye pain Ear, nose and throat: No hearing loss, ear pain, nasal congestion, sore throat Cardiovascular: No chest pain, palpitations Respiratory:  No shortness of breath at rest or with exertion, wheezes GastrointestinaI: No nausea, vomiting, diarrhea, abdominal pain, fecal incontinence Genitourinary:  No dysuria, urinary retention or frequency Musculoskeletal:  No neck pain, back pain Integumentary: No rash, pruritus, skin lesions Neurological: as above Psychiatric: No depression, insomnia, anxiety Endocrine: No palpitations, fatigue, diaphoresis, mood swings, change in appetite, change in weight, increased thirst Hematologic/Lymphatic:  No anemia,  purpura, petechiae. Allergic/Immunologic: no itchy/runny eyes, nasal congestion, recent allergic reactions, rashes  PHYSICAL EXAM: Vitals:   11/08/17 0826  BP: 138/76  Pulse: (!) 53  SpO2: 93%   General: No acute distress Head:  Normocephalic/atraumatic Eyes: Fundoscopic exam shows bilateral sharp discs, no vessel changes, exudates, or hemorrhages Neck: supple, no paraspinal tenderness, full range of motion Back: No paraspinal tenderness Heart: regular rate and rhythm Lungs: Clear to auscultation bilaterally. Vascular: No carotid bruits. Skin/Extremities: No rash, no edema Neurological Exam: Mental status: alert and oriented to person, place, and time, no dysarthria or aphasia, Fund of knowledge is appropriate.  Recent and remote memory are intact.  Attention and concentration are normal.    Able to name objects and repeat phrases. Cranial nerves: CN I: not tested CN II: pupils equal, round and reactive to light,  visual fields intact, fundi unremarkable. CN III, IV, VI:  full range of motion, no nystagmus, no ptosis CN V: facial sensation intact CN VII: upper and lower face symmetric CN VIII: hearing intact to finger rub CN IX, X: gag intact, uvula midline CN XI: sternocleidomastoid and trapezius muscles intact CN XII: tongue midline Bulk & Tone: normal, no fasciculations. Motor: 5/5 throughout with no pronator drift. Sensation: intact to light touch, cold, pin, decreased vibration to ankles bilaterally. No extinction to double simultaneous stimulation.  Romberg test negative Deep Tendon Reflexes: +2 throughout, no ankle clonus Plantar responses: downgoing bilaterally Cerebellar: no incoordination on finger to nose, heel to shin. No dysdiadochokinesia Gait: narrow-based and steady, able to tandem walk adequately. Tremor: none   IMPRESSION: This is a pleasant 84 year old right-handed man with a history of hypertension, prostate cancer, atrial fibrillation on Eliquis,  presenting after a transient episode of right arm and leg weakness last 10/13/17, concerning for TIA. His neurological exam today is normal, with an ABCD2 score of 3 (low risk). We discussed that he is at higher risk for stroke with atrial fibrillation, he is on renal dosing of Eliquis and had the transient event while on anticoagulation. He had a recent echocardiogram a month prior to TIA with EF 45-50%, mildly dilated left atrium. We discussed head CT showing hypodensity in the left subcortical region which could account for the right sided weakness, however CT shows indeterminate age of infarct. MRI brain with and without contrast will be ordered to further evaluate this. Stroke workup with carotid dopplers, lipid panel, and HbA1c will be ordered, we discussed goal LDL of <100, management of vascular risk factors, and continuation of Eliquis. He knows to go to the ER immediately for any sudden change in symptoms. He travels back and forth from [place] to [place], and will follow-up on his next [place] trip. He knows to call for any changes.   Thank you for allowing me to participate in the care of this patient. Please do not hesitate to call for any questions or concerns.   Ellouise Newer, M.D.  CC: Dr. Lovena Le, Dr. Delfina Redwood, Daune Perch, NP

## 2017-11-08 NOTE — Patient Instructions (Addendum)
1. Schedule MRI brain with and without contrast  We have sent a referral to [facility] for your MRI and they will call you directly to schedule your appt. They are located at [address]. If you need to contact them directly please call [phone].   2. Schedule carotid dopplers  We have sent a referral to [facility] for your Doppler and they will call you directly to schedule your appt. They are located at [place]. If you need to contact them directly please call [phone].   3. Bloodwork for lipid panel, HbA1c  Your provider has requested that you have labwork completed today. Please go to [facility] ([id number]) on the second floor of this building before leaving the office today. You do not need to check in. If you are not called within 15 minutes please check with the front desk.  \ 4. Continue all your current medications 5. For any sudden change in symptoms, go to ER immediately 6. Follow-up when you are back in [place] in the Fall

## 2017-11-09 LAB — HEMOGLOBIN A1C
Hgb A1c MFr Bld: 5.8 % of total Hgb — ABNORMAL HIGH (ref <5.7)
Mean Plasma Glucose: 120 (calc)
eAG (mmol/L): 6.6 (calc)

## 2017-11-09 LAB — CHOLESTEROL, TOTAL: Cholesterol: 158 mg/dL (ref <200)

## 2017-11-12 ENCOUNTER — Encounter: Payer: Self-pay | Admitting: Neurology

## 2017-11-12 DIAGNOSIS — Z8546 Personal history of malignant neoplasm of prostate: Secondary | ICD-10-CM | POA: Insufficient documentation

## 2017-11-12 DIAGNOSIS — G459 Transient cerebral ischemic attack, unspecified: Secondary | ICD-10-CM | POA: Insufficient documentation

## 2017-11-13 ENCOUNTER — Other Ambulatory Visit: Payer: Self-pay

## 2017-11-13 ENCOUNTER — Other Ambulatory Visit: Payer: Medicare Other

## 2017-11-13 DIAGNOSIS — G459 Transient cerebral ischemic attack, unspecified: Secondary | ICD-10-CM

## 2017-11-14 LAB — LIPID PANEL WITH LDL/HDL RATIO
CHOLESTEROL TOTAL: 168 mg/dL (ref 100–199)
HDL: 40 mg/dL (ref >39)
LDL CALC: 108 mg/dL — AB (ref 0–99)
LDl/HDL Ratio: 2.7 ratio (ref 0.0–3.6)
TRIGLYCERIDES: 102 mg/dL (ref 0–149)
VLDL CHOLESTEROL CAL: 20 mg/dL (ref 5–40)

## 2017-11-23 ENCOUNTER — Telehealth: Payer: Self-pay | Admitting: Internal Medicine

## 2017-11-23 ENCOUNTER — Telehealth: Payer: Self-pay

## 2017-11-23 ENCOUNTER — Ambulatory Visit
Admission: RE | Admit: 2017-11-23 | Discharge: 2017-11-23 | Disposition: A | Discharge status: Home / Self Care | Payer: Medicare Other | Source: Ambulatory Visit | Attending: Neurology | Admitting: Neurology

## 2017-11-23 DIAGNOSIS — R42 Dizziness and giddiness: Secondary | ICD-10-CM | POA: Diagnosis not present

## 2017-11-23 DIAGNOSIS — I480 Paroxysmal atrial fibrillation: Secondary | ICD-10-CM

## 2017-11-23 DIAGNOSIS — G459 Transient cerebral ischemic attack, unspecified: Secondary | ICD-10-CM

## 2017-11-23 DIAGNOSIS — I6523 Occlusion and stenosis of bilateral carotid arteries: Secondary | ICD-10-CM | POA: Diagnosis not present

## 2017-11-23 DIAGNOSIS — Z8546 Personal history of malignant neoplasm of prostate: Secondary | ICD-10-CM

## 2017-11-23 MED ORDER — GADOBENATE DIMEGLUMINE 529 MG/ML IV SOLN
20.0000 mL | Freq: Once | INTRAVENOUS | Status: AC | PRN
  Administered 2017-11-23: 20 mL via INTRAVENOUS

## 2017-11-23 NOTE — Telephone Encounter (Signed)
New message  ° ° °Abnormal ekg  °

## 2017-11-23 NOTE — Telephone Encounter (Signed)
-----   Message from Cameron Sprang, MD sent at 11/23/2017 12:13 PM EDT ----- Pls let him know the carotid ultrasound did not show any significant blockage, thanks

## 2017-11-23 NOTE — Telephone Encounter (Signed)
Spoke with pt relaying message below.   

## 2017-11-23 NOTE — Telephone Encounter (Signed)
EKG has been left with Dr Johnsie Cancel to review; Upon calling the pt, I spoke with his daughter and confirmed the pt was concurrently having carotid ultrasound performed. I told his daughter I would follow up with them in a couple hours. His daughter also stated he will be off the monitor today for an MRI. I advised her to call the company to let them know.

## 2017-11-23 NOTE — Telephone Encounter (Signed)
Per Dr Johnsie Cancel, he has no intervention at this time.

## 2017-11-23 NOTE — Telephone Encounter (Signed)
Pt's daughter made aware there will be no intervention for her dad's heart arrhythmia today. Pts daughter stated they called the monitor company to let them know he would be off the monitor during his mri today as well.

## 2017-12-11 ENCOUNTER — Encounter: Payer: Self-pay | Admitting: Internal Medicine

## 2017-12-11 ENCOUNTER — Ambulatory Visit (INDEPENDENT_AMBULATORY_CARE_PROVIDER_SITE_OTHER): Payer: Medicare Other | Admitting: Internal Medicine

## 2017-12-11 VITALS — BP 126/70 | HR 112 | Ht 70.0 in | Wt 227.0 lb

## 2017-12-11 DIAGNOSIS — I10 Essential (primary) hypertension: Secondary | ICD-10-CM | POA: Diagnosis not present

## 2017-12-11 DIAGNOSIS — I480 Paroxysmal atrial fibrillation: Secondary | ICD-10-CM | POA: Diagnosis not present

## 2017-12-11 DIAGNOSIS — I4892 Unspecified atrial flutter: Secondary | ICD-10-CM | POA: Diagnosis not present

## 2017-12-11 NOTE — Progress Notes (Addendum)
HPI Ernest Parker returns today for evaluation of atrial fib/flutter. The patient has had a h/o palpitations in the past and sought medical attention while he was down in [place]. He was found to have atrial flutter and was placed on a beta blocker and Eliquis. His dose of Eliquis was down titrated to 2.5 twice a day based on his renal function. He is referred for evaluation. He denies chest pain or sob. He notes an episode of right arm and leg weakness. Subsequent eval with a head CT was unremarkable. MRI revealed an indeterminate age stroke. No symptoms since he is not sure if he has had recurrent palpitations. He wore a cardiac monitor which demonstrated atrial fib with a  RVR, CVR and a slow VR with post termination pauses. His pauses are over 4 seconds and he has sinus bradycardia as well with HR's in the 30's. Despite this he is minimally symptomatic. The patient is planning to drive back home up [place].   Allergies  Allergen Reactions  . Penicillins Other (See Comments)    Patient can't remember reaction was told by mother was allergic as a child       Current Outpatient Medications  Medication Sig Dispense Refill  . apixaban (ELIQUIS) 2.5 MG TABS tablet Take 1 tablet (2.5 mg total) by mouth 2 (two) times daily. 60 tablet 11  . Cholecalciferol (VITAMIN D3) 2000 units TABS Take 2,000 Units by mouth daily.     . metoprolol succinate (TOPROL-XL) 50 MG 24 hr tablet Take 1 tablet (50 mg total) by mouth 2 (two) times daily. Take with or immediately following a meal. 90 tablet 3  . pantoprazole (PROTONIX) 40 MG tablet Take 40 mg by mouth daily.  0  . Red Yeast Rice 600 MG CAPS Take 1 capsule by mouth daily.     No current facility-administered medications for this visit.      Past Medical History:  Diagnosis Date  . A-fib ([place])   . Cancer [facility]) 2002   prostate  . Dizzy   . Dyspnea   . GERD (gastroesophageal reflux disease)   . Hard of hearing    wears hearing aids bilat  .  Hypertension   . SOB (shortness of breath)     ROS:   All systems reviewed and negative except as noted in the HPI.   Past Surgical History:  Procedure Laterality Date  . INGUINAL HERNIA REPAIR Bilateral      Family History  Problem Relation Age of Onset  . Hypertension Mother   . Tuberculosis Father   . Kidney disease Sister   . Sleep apnea Sister      Social History   Socioeconomic History  . Marital status: Widowed    Spouse name: Not on file  . Number of children: Not on file  . Years of education: Not on file  . Highest education level: Not on file  Occupational History  . Occupation: RETIRED  Social Needs  . Financial resource strain: Not on file  . Food insecurity:    Worry: Not on file    Inability: Not on file  . Transportation needs:    Medical: Not on file    Non-medical: Not on file  Tobacco Use  . Smoking status: Former Smoker    Packs/day: 0.50    Years: 30.00    Pack years: 15.00    Types: Cigarettes    Last attempt to quit: 1980    Years since quitting:  39.2  . Smokeless tobacco: Never Used  Substance and Sexual Activity  . Alcohol use: Yes    Alcohol/week: 1.2 oz    Types: 2 Cans of beer per week    Comment: 2 BEERS on most days  . Drug use: No  . Sexual activity: Not on file  Lifestyle  . Physical activity:    Days per week: Not on file    Minutes per session: Not on file  . Stress: Not on file  Relationships  . Social connections:    Talks on phone: Not on file    Gets together: Not on file    Attends religious service: Not on file    Active member of club or organization: Not on file    Attends meetings of clubs or organizations: Not on file    Relationship status: Not on file  . Intimate partner violence:    Fear of current or ex partner: Not on file    Emotionally abused: Not on file    Physically abused: Not on file    Forced sexual activity: Not on file  Other Topics Concern  . 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 Lives alone during the summer in [place] and lives with daughter during the Winter in [place], moving to [place]. Has 1 daughter and 2 sons. One sone lives in [place] and one in [place].   Is an avid golfer, plays 4-5 days per week. Likes to do yardwork.      BP 126/70   Pulse (!) 112   Ht 5\' 10"  (1.778 m)   Wt 227 lb (103 kg)   BMI 32.57 kg/m   Physical Exam:  Well appearing NAD HEENT: Unremarkable Neck:  6 cm JVD, no thyromegally Lymphatics:  No adenopathy Back:  No CVA tenderness Lungs:  Clear with no wheezes HEART:  Regular rate rhythm, no murmurs, no rubs, no clicks Abd:  soft, positive bowel sounds, no organomegally, no rebound, no guarding Ext:  2 plus pulses, no edema, no cyanosis, no clubbing Skin:  No rashes no nodules Neuro:  CN II through XII intact, motor grossly intact  EKG - atrial fib with a RVR  Assess/Plan: 1. PAF - he is asymptomatic. He will continue Eliquis. He will continue his beta blocker for rate control 2. Sinus node dysfunction - we discussed the treatment options. Despite his minimal amount of symptoms, I cannot recommend he drive with these pauses unless he undergoes PPM insertion. I have discussed the indications/risk/benefits/goals/expectations of PPM insertion and he will call us if he wishes to proceed. 3. HTN - his blood pressure is controlled. No change in meds.  I spent over 30 minutes with the patient and his daughter going over the heart monitor and discussing treatment options. Basically he will need to stop driving or undergo PPM insertion.  [name], M.D.

## 2017-12-11 NOTE — Patient Instructions (Addendum)
Medication Instructions:  Your physician recommends that you continue on your current medications as directed. Please refer to the Current Medication list given to you today.  Labwork: None ordered.  Testing/Procedures: Your physician has recommended that you have a pacemaker inserted. A pacemaker is a small device that is placed under the skin of your chest or abdomen to help control abnormal heart rhythms. This device uses electrical pulses to prompt the heart to beat at a normal rate. Pacemakers are used to treat heart rhythms that are too slow. Wire (leads) are attached to the pacemaker that goes into the chambers of you heart. This is done in the hospital and usually requires and overnight stay. Please see the instruction sheet given to you today for more information.  Follow-Up:  The following days are available for procedures: April  8, 11, 15, 18, 22, 23, 24, and 30 If you decide on a day please give me a call:  Bing Neighbors [phone]  Your physician wants you to follow-up in: one year with Dr. Lovena Le.   You will receive a reminder letter in the mail two months in advance. If you don't receive a letter, please call our office to schedule the follow-up appointment.  Any Other Special Instructions Will Be Listed Below (If Applicable).  If you need a refill on your cardiac medications before your next appointment, please call your pharmacy.   Pacemaker Implantation, Adult Pacemaker implantation is a procedure to place a pacemaker inside your chest. A pacemaker is a small computer that sends electrical signals to the heart and helps your heart beat normally. A pacemaker also stores information about your heart rhythms. You may need pacemaker implantation if you:  Have a slow heartbeat (bradycardia).  Faint (syncope).  Have shortness of breath (dyspnea) due to heart problems.  The pacemaker attaches to your heart through a wire, called a lead. Sometimes just one lead is needed. Other  times, there will be two leads. There are two types of pacemakers:  Transvenous pacemaker. This type is placed under the skin or muscle of your chest. The lead goes through a vein in the chest area to reach the inside of the heart.  Epicardial pacemaker. This type is placed under the skin or muscle of your chest or belly. The lead goes through your chest to the outside of the heart.  Tell a health care provider about:  Any allergies you have.  All medicines you are taking, including vitamins, herbs, eye drops, creams, and over-the-counter medicines.  Any problems you or family members have had with anesthetic medicines.  Any blood or bone disorders you have.  Any surgeries you have had.  Any medical conditions you have.  Whether you are pregnant or may be pregnant. What are the risks? Generally, this is a safe procedure. However, problems may occur, including:  Infection.  Bleeding.  Failure of the pacemaker or the lead.  Collapse of a lung or bleeding into a lung.  Blood clot inside a blood vessel with a lead.  Damage to the heart.  Infection inside the heart (endocarditis).  Allergic reactions to medicines.  What happens before the procedure? Staying hydrated Follow instructions from your health care provider about hydration, which may include:  Up to 2 hours before the procedure - you may continue to drink clear liquids, such as water, clear fruit juice, black coffee, and plain tea.  Eating and drinking restrictions Follow instructions from your health care provider about eating and drinking, which may include:  8 hours before the procedure - stop eating heavy meals or foods such as meat, fried foods, or fatty foods.  6 hours before the procedure - stop eating light meals or foods, such as toast or cereal.  6 hours before the procedure - stop drinking milk or drinks that contain milk.  2 hours before the procedure - stop drinking clear  liquids.  Medicines  Ask your health care provider about: ? Changing or stopping your regular medicines. This is especially important if you are taking diabetes medicines or blood thinners. ? Taking medicines such as aspirin and ibuprofen. These medicines can thin your blood. Do not take these medicines before your procedure if your health care provider instructs you not to.  You may be given antibiotic medicine to help prevent infection. General instructions  You will have a heart evaluation. This may include an electrocardiogram (ECG), chest X-ray, and heart imaging (echocardiogram,  or echo) tests.  You will have blood tests.  Do not use any products that contain nicotine or tobacco, such as cigarettes and e-cigarettes. If you need help quitting, ask your health care provider.  Plan to have someone take you home from the hospital or clinic.  If you will be going home right after the procedure, plan to have someone with you for 24 hours.  Ask your health care provider how your surgical site will be marked or identified. What happens during the procedure?  To reduce your risk of infection: ? Your health care team will wash or sanitize their hands. ? Your skin will be washed with soap. ? Hair may be removed from the surgical area.  An IV tube will be inserted into one of your veins.  You will be given one or more of the following: ? A medicine to help you relax (sedative). ? A medicine to numb the area (local anesthetic). ? A medicine to make you fall asleep (general anesthetic).  If you are getting a transvenous pacemaker: ? An incision will be made in your upper chest. ? A pocket will be made for the pacemaker. It may be placed under the skin or between layers of muscle. ? The lead will be inserted into a blood vessel that returns to the heart. ? While X-rays are taken by an imaging machine (fluoroscopy), the lead will be advanced through the vein to the inside of your  heart. ? The other end of the lead will be tunneled under the skin and attached to the pacemaker.  If you are getting an epicardial pacemaker: ? An incision will be made near your ribs or breastbone (sternum) for the lead. ? The lead will be attached to the outside of your heart. ? Another incision will be made in your chest or upper belly to create a pocket for the pacemaker. ? The free end of the lead will be tunneled under the skin and attached to the pacemaker.  The transvenous or epicardial pacemaker will be tested. Imaging studies may be done to check the lead position.  The incisions will be closed with stitches (sutures), adhesive strips, or skin glue.  Bandages (dressing) will be placed over the incisions. The procedure may vary among health care providers and hospitals. What happens after the procedure?  Your blood pressure, heart rate, breathing rate, and blood oxygen level will be monitored until the medicines you were given have worn off.  You will be given antibiotics and pain medicine.  ECG and chest x-rays will be done.  You  will wear a continuous type of ECG (Holter monitor) to check your heart rhythm.  Your health care provider willprogram the pacemaker.  Do not drive for 24 hours if you received a sedative. This information is not intended to replace advice given to you by your health care provider. Make sure you discuss any questions you have with your health care provider. Document Released: 08/18/2002 Document Revised: 03/17/2016 Document Reviewed: 02/09/2016 Elsevier Interactive Patient Education  [employer].

## 2017-12-11 NOTE — H&P (View-Only) (Signed)
HPI Ernest Parker returns today for evaluation of atrial fib/flutter. The patient has had a h/o palpitations in the past and sought medical attention while he was down in [place]. He was found to have atrial flutter and was placed on a beta blocker and Eliquis. His dose of Eliquis was down titrated to 2.5 twice a day based on his renal function. He is referred for evaluation. He denies chest pain or sob. He notes an episode of right arm and leg weakness. Subsequent eval with a head CT was unremarkable. MRI revealed an indeterminate age stroke. No symptoms since he is not sure if he has had recurrent palpitations. He wore a cardiac monitor which demonstrated atrial fib with a  RVR, CVR and a slow VR with post termination pauses. His pauses are over 4 seconds and he has sinus bradycardia as well with HR's in the 30's. Despite this he is minimally symptomatic. The patient is planning to drive back home up [place].   Allergies  Allergen Reactions  . Penicillins Other (See Comments)    Patient can't remember reaction was told by mother was allergic as a child       Current Outpatient Medications  Medication Sig Dispense Refill  . apixaban (ELIQUIS) 2.5 MG TABS tablet Take 1 tablet (2.5 mg total) by mouth 2 (two) times daily. 60 tablet 11  . Cholecalciferol (VITAMIN D3) 2000 units TABS Take 2,000 Units by mouth daily.     . metoprolol succinate (TOPROL-XL) 50 MG 24 hr tablet Take 1 tablet (50 mg total) by mouth 2 (two) times daily. Take with or immediately following a meal. 90 tablet 3  . pantoprazole (PROTONIX) 40 MG tablet Take 40 mg by mouth daily.  0  . Red Yeast Rice 600 MG CAPS Take 1 capsule by mouth daily.     No current facility-administered medications for this visit.      Past Medical History:  Diagnosis Date  . A-fib ([place])   . Cancer [facility]) 2002   prostate  . Dizzy   . Dyspnea   . GERD (gastroesophageal reflux disease)   . Hard of hearing    wears hearing aids bilat  .  Hypertension   . SOB (shortness of breath)     ROS:   All systems reviewed and negative except as noted in the HPI.   Past Surgical History:  Procedure Laterality Date  . INGUINAL HERNIA REPAIR Bilateral      Family History  Problem Relation Age of Onset  . Hypertension Mother   . Tuberculosis Father   . Kidney disease Sister   . Sleep apnea Sister      Social History   Socioeconomic History  . Marital status: Widowed    Spouse name: Not on file  . Number of children: Not on file  . Years of education: Not on file  . Highest education level: Not on file  Occupational History  . Occupation: RETIRED  Social Needs  . Financial resource strain: Not on file  . Food insecurity:    Worry: Not on file    Inability: Not on file  . Transportation needs:    Medical: Not on file    Non-medical: Not on file  Tobacco Use  . Smoking status: Former Smoker    Packs/day: 0.50    Years: 30.00    Pack years: 15.00    Types: Cigarettes    Last attempt to quit: 1980    Years since quitting:  39.2  . Smokeless tobacco: Never Used  Substance and Sexual Activity  . Alcohol use: Yes    Alcohol/week: 1.2 oz    Types: 2 Cans of beer per week    Comment: 2 BEERS on most days  . Drug use: No  . Sexual activity: Not on file  Lifestyle  . Physical activity:    Days per week: Not on file    Minutes per session: Not on file  . Stress: Not on file  Relationships  . Social connections:    Talks on phone: Not on file    Gets together: Not on file    Attends religious service: Not on file    Active member of club or organization: Not on file    Attends meetings of clubs or organizations: Not on file    Relationship status: Not on file  . Intimate partner violence:    Fear of current or ex partner: Not on file    Emotionally abused: Not on file    Physically abused: Not on file    Forced sexual activity: Not on file  Other Topics Concern  . 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 Lives alone during the summer in [place] and lives with daughter during the Winter in [place], moving to [place]. Has 1 daughter and 2 sons. One sone lives in [place] and one in [place].   Is an avid golfer, plays 4-5 days per week. Likes to do yardwork.      BP 126/70   Pulse (!) 112   Ht 5\' 10"  (1.778 m)   Wt 227 lb (103 kg)   BMI 32.57 kg/m   Physical Exam:  Well appearing NAD HEENT: Unremarkable Neck:  6 cm JVD, no thyromegally Lymphatics:  No adenopathy Back:  No CVA tenderness Lungs:  Clear with no wheezes HEART:  Regular rate rhythm, no murmurs, no rubs, no clicks Abd:  soft, positive bowel sounds, no organomegally, no rebound, no guarding Ext:  2 plus pulses, no edema, no cyanosis, no clubbing Skin:  No rashes no nodules Neuro:  CN II through XII intact, motor grossly intact  EKG - atrial fib with a RVR  Assess/Plan: 1. PAF - he is asymptomatic. He will continue Eliquis. He will continue his beta blocker for rate control 2. Sinus node dysfunction - we discussed the treatment options. Despite his minimal amount of symptoms, I cannot recommend he drive with these pauses unless he undergoes PPM insertion. I have discussed the indications/risk/benefits/goals/expectations of PPM insertion and he will call us if he wishes to proceed. 3. HTN - his blood pressure is controlled. No change in meds.  I spent over 30 minutes with the patient and his daughter going over the heart monitor and discussing treatment options. Basically he will need to stop driving or undergo PPM insertion.  [name], M.D.

## 2017-12-12 ENCOUNTER — Telehealth: Payer: Self-pay

## 2017-12-12 DIAGNOSIS — I495 Sick sinus syndrome: Secondary | ICD-10-CM

## 2017-12-12 NOTE — Telephone Encounter (Signed)
Returned call to Pt daughter Baker Janus.  Pt willing to stay in [place] long enough for 10 day post PPM placement wound check.  Will schedule for December 27, 2017 as requested.

## 2017-12-12 NOTE — Telephone Encounter (Signed)
Follow Up:   Ernest Parker said she talked to you this morning and was supposed to call back,concerning follow up after his procedure.

## 2017-12-12 NOTE — Telephone Encounter (Signed)
Call received from Pt's daughter. Will schedule ppm for December 17, 2017 @ 2:30 pm. Pt will come for labs and instruction letter tomorrow.

## 2017-12-13 ENCOUNTER — Other Ambulatory Visit: Payer: Medicare Other | Admitting: *Deleted

## 2017-12-13 DIAGNOSIS — I495 Sick sinus syndrome: Secondary | ICD-10-CM

## 2017-12-13 LAB — CBC WITH DIFFERENTIAL/PLATELET
Basophils Absolute: 0.1 10*3/uL (ref 0.0–0.2)
Basos: 1 %
EOS (ABSOLUTE): 0.5 10*3/uL — AB (ref 0.0–0.4)
EOS: 7 %
Hematocrit: 38.4 % (ref 37.5–51.0)
Hemoglobin: 13.1 g/dL (ref 13.0–17.7)
IMMATURE GRANULOCYTES: 0 %
Immature Grans (Abs): 0 10*3/uL (ref 0.0–0.1)
LYMPHS: 22 %
Lymphocytes Absolute: 1.6 10*3/uL (ref 0.7–3.1)
MCH: 30.1 pg (ref 26.6–33.0)
MCHC: 34.1 g/dL (ref 31.5–35.7)
MCV: 88 fL (ref 79–97)
Monocytes Absolute: 0.4 10*3/uL (ref 0.1–0.9)
Monocytes: 6 %
NEUTROS PCT: 64 %
Neutrophils Absolute: 4.6 10*3/uL (ref 1.4–7.0)
PLATELETS: 217 10*3/uL (ref 150–379)
RBC: 4.35 x10E6/uL (ref 4.14–5.80)
RDW: 14.5 % (ref 12.3–15.4)
WBC: 7.3 10*3/uL (ref 3.4–10.8)

## 2017-12-13 LAB — BASIC METABOLIC PANEL
BUN/Creatinine Ratio: 11 (ref 10–24)
BUN: 19 mg/dL (ref 8–27)
CALCIUM: 9 mg/dL (ref 8.6–10.2)
CHLORIDE: 104 mmol/L (ref 96–106)
CO2: 23 mmol/L (ref 20–29)
Creatinine, Ser: 1.7 mg/dL — ABNORMAL HIGH (ref 0.76–1.27)
GFR calc Af Amer: 42 mL/min/{1.73_m2} — ABNORMAL LOW (ref >59)
GFR calc non Af Amer: 37 mL/min/{1.73_m2} — ABNORMAL LOW (ref >59)
Glucose: 109 mg/dL — ABNORMAL HIGH (ref 65–99)
Potassium: 4.1 mmol/L (ref 3.5–5.2)
Sodium: 141 mmol/L (ref 134–144)

## 2017-12-17 ENCOUNTER — Ambulatory Visit (HOSPITAL_COMMUNITY)
Admission: RE | Admit: 2017-12-17 | Discharge: 2017-12-18 | Disposition: A | Discharge status: Home / Self Care | Payer: Medicare Other | Source: Ambulatory Visit | Attending: Internal Medicine | Admitting: Internal Medicine

## 2017-12-17 ENCOUNTER — Encounter (HOSPITAL_COMMUNITY): Payer: Self-pay | Admitting: General Practice

## 2017-12-17 ENCOUNTER — Other Ambulatory Visit: Payer: Self-pay

## 2017-12-17 ENCOUNTER — Ambulatory Visit (HOSPITAL_COMMUNITY)
Admission: RE | Disposition: A | Discharge status: Home / Self Care | Payer: Medicare Other | Source: Ambulatory Visit | Attending: Internal Medicine

## 2017-12-17 DIAGNOSIS — Z836 Family history of other diseases of the respiratory system: Secondary | ICD-10-CM | POA: Insufficient documentation

## 2017-12-17 DIAGNOSIS — I4891 Unspecified atrial fibrillation: Secondary | ICD-10-CM | POA: Diagnosis not present

## 2017-12-17 DIAGNOSIS — Z950 Presence of cardiac pacemaker: Secondary | ICD-10-CM

## 2017-12-17 DIAGNOSIS — H9193 Unspecified hearing loss, bilateral: Secondary | ICD-10-CM | POA: Insufficient documentation

## 2017-12-17 DIAGNOSIS — Z880 Allergy status to penicillin: Secondary | ICD-10-CM | POA: Diagnosis not present

## 2017-12-17 DIAGNOSIS — I10 Essential (primary) hypertension: Secondary | ICD-10-CM | POA: Insufficient documentation

## 2017-12-17 DIAGNOSIS — Z87891 Personal history of nicotine dependence: Secondary | ICD-10-CM | POA: Diagnosis not present

## 2017-12-17 DIAGNOSIS — Z7901 Long term (current) use of anticoagulants: Secondary | ICD-10-CM | POA: Insufficient documentation

## 2017-12-17 DIAGNOSIS — I700 Atherosclerosis of aorta: Secondary | ICD-10-CM | POA: Insufficient documentation

## 2017-12-17 DIAGNOSIS — I4892 Unspecified atrial flutter: Secondary | ICD-10-CM | POA: Diagnosis not present

## 2017-12-17 DIAGNOSIS — I495 Sick sinus syndrome: Secondary | ICD-10-CM | POA: Diagnosis not present

## 2017-12-17 DIAGNOSIS — Z8249 Family history of ischemic heart disease and other diseases of the circulatory system: Secondary | ICD-10-CM | POA: Diagnosis not present

## 2017-12-17 DIAGNOSIS — K219 Gastro-esophageal reflux disease without esophagitis: Secondary | ICD-10-CM | POA: Insufficient documentation

## 2017-12-17 DIAGNOSIS — R0600 Dyspnea, unspecified: Secondary | ICD-10-CM | POA: Diagnosis not present

## 2017-12-17 DIAGNOSIS — Z8546 Personal history of malignant neoplasm of prostate: Secondary | ICD-10-CM | POA: Insufficient documentation

## 2017-12-17 DIAGNOSIS — Z79899 Other long term (current) drug therapy: Secondary | ICD-10-CM | POA: Insufficient documentation

## 2017-12-17 HISTORY — DX: Pure hypercholesterolemia, unspecified: E78.00

## 2017-12-17 HISTORY — DX: Malignant neoplasm of prostate: C61

## 2017-12-17 HISTORY — PX: PACEMAKER IMPLANT: EP1218

## 2017-12-17 HISTORY — PX: INSERT / REPLACE / REMOVE PACEMAKER: SUR710

## 2017-12-17 HISTORY — DX: Presence of cardiac pacemaker: Z95.0

## 2017-12-17 LAB — SURGICAL PCR SCREEN
MRSA, PCR: NEGATIVE
STAPHYLOCOCCUS AUREUS: NEGATIVE

## 2017-12-17 SURGERY — PACEMAKER IMPLANT

## 2017-12-17 MED ORDER — SODIUM CHLORIDE 0.9 % IR SOLN
80.0000 mg | Status: AC
  Administered 2017-12-17: 80 mg

## 2017-12-17 MED ORDER — FENTANYL CITRATE (PF) 100 MCG/2ML IJ SOLN
INTRAMUSCULAR | Status: DC | PRN
  Administered 2017-12-17 (×2): 12.5 ug via INTRAVENOUS

## 2017-12-17 MED ORDER — LIDOCAINE HCL (PF) 1 % IJ SOLN
INTRAMUSCULAR | Status: AC
  Filled 2017-12-17: qty 60

## 2017-12-17 MED ORDER — MIDAZOLAM HCL 5 MG/5ML IJ SOLN
INTRAMUSCULAR | Status: DC | PRN
  Administered 2017-12-17 (×3): 1 mg via INTRAVENOUS

## 2017-12-17 MED ORDER — CHLORHEXIDINE GLUCONATE 4 % EX LIQD
60.0000 mL | Freq: Once | CUTANEOUS | Status: DC
  Filled 2017-12-17: qty 60

## 2017-12-17 MED ORDER — METOPROLOL SUCCINATE ER 50 MG PO TB24
50.0000 mg | ORAL_TABLET | Freq: Two times a day (BID) | ORAL | Status: DC
  Administered 2017-12-17 – 2017-12-18 (×2): 50 mg via ORAL
  Filled 2017-12-17 (×2): qty 1

## 2017-12-17 MED ORDER — VANCOMYCIN HCL IN DEXTROSE 1-5 GM/200ML-% IV SOLN
1000.0000 mg | Freq: Two times a day (BID) | INTRAVENOUS | Status: AC
  Administered 2017-12-18: 02:00:00 1000 mg via INTRAVENOUS
  Filled 2017-12-17: qty 200

## 2017-12-17 MED ORDER — SODIUM CHLORIDE 0.9 % IV SOLN
INTRAVENOUS | Status: DC
  Administered 2017-12-17: 13:00:00 via INTRAVENOUS

## 2017-12-17 MED ORDER — MIDAZOLAM HCL 5 MG/5ML IJ SOLN
INTRAMUSCULAR | Status: AC
  Filled 2017-12-17: qty 5

## 2017-12-17 MED ORDER — SODIUM CHLORIDE 0.9 % IV SOLN
INTRAVENOUS | Status: AC
  Filled 2017-12-17: qty 2

## 2017-12-17 MED ORDER — MUPIROCIN 2 % EX OINT: 1.0000 "application " | TOPICAL_OINTMENT | Freq: Once | CUTANEOUS | Status: DC

## 2017-12-17 MED ORDER — LIDOCAINE HCL (PF) 1 % IJ SOLN
INTRAMUSCULAR | Status: DC | PRN
  Administered 2017-12-17: 60 mL

## 2017-12-17 MED ORDER — ONDANSETRON HCL 4 MG/2ML IJ SOLN: 4.0000 mg | Freq: Four times a day (QID) | INTRAMUSCULAR | Status: DC | PRN

## 2017-12-17 MED ORDER — ACETAMINOPHEN 325 MG PO TABS: 325.0000 mg | ORAL_TABLET | ORAL | Status: DC | PRN

## 2017-12-17 MED ORDER — VANCOMYCIN HCL 10 G IV SOLR
1500.0000 mg | INTRAVENOUS | Status: AC
  Administered 2017-12-17: 1500 mg via INTRAVENOUS
  Filled 2017-12-17: qty 1500

## 2017-12-17 MED ORDER — SODIUM CHLORIDE 0.9 % IV SOLN
INTRAVENOUS | Status: DC | PRN
  Administered 2017-12-17: 16:00:00

## 2017-12-17 MED ORDER — FENTANYL CITRATE (PF) 100 MCG/2ML IJ SOLN
INTRAMUSCULAR | Status: AC
  Filled 2017-12-17: qty 2

## 2017-12-17 MED ORDER — MUPIROCIN 2 % EX OINT
TOPICAL_OINTMENT | CUTANEOUS | Status: AC
  Administered 2017-12-17: 1
  Filled 2017-12-17: qty 22

## 2017-12-17 MED ORDER — HEPARIN (PORCINE) IN NACL 2-0.9 UNIT/ML-% IJ SOLN
INTRAMUSCULAR | Status: AC | PRN
  Administered 2017-12-17: 500 mL

## 2017-12-17 MED ORDER — HEPARIN (PORCINE) IN NACL 2-0.9 UNIT/ML-% IJ SOLN
INTRAMUSCULAR | Status: AC
  Filled 2017-12-17: qty 500

## 2017-12-17 MED ORDER — VITAMIN D 1000 UNITS PO TABS
2000.0000 [IU] | ORAL_TABLET | Freq: Every day | ORAL | Status: DC
  Administered 2017-12-18: 2000 [IU] via ORAL
  Filled 2017-12-17: qty 2

## 2017-12-17 SURGICAL SUPPLY — 8 items
CABLE SURGICAL S-101-97-12 (CABLE) ×2 IMPLANT
GUIDEWIRE ANGLED .035X150CM (WIRE) ×2 IMPLANT
LEAD TENDRIL MRI 52CM LPA1200M (Lead) ×2 IMPLANT
LEAD TENDRIL MRI 58CM LPA1200M (Lead) ×2 IMPLANT
PACEMAKER ASSURITY DR-RF (Pacemaker) ×2 IMPLANT
PAD DEFIB LIFELINK (PAD) ×2 IMPLANT
SHEATH CLASSIC 8F (SHEATH) ×4 IMPLANT
TRAY PACEMAKER INSERTION (PACKS) ×2 IMPLANT

## 2017-12-17 NOTE — Discharge Instructions (Signed)
° ° °  Supplemental Discharge Instructions for  Pacemaker/Defibrillator Patients  Activity No heavy lifting or vigorous activity with your left/right arm for 6 to 8 weeks.  Do not raise your left/right arm above your head for one week.  Gradually raise your affected arm as drawn below.           __          12/21/17               12/22/17                      12/23/17                     12/24/17  NO DRIVING for  1 week   ; you may begin driving on   12/27/38 .  WOUND CARE - Keep the wound area clean and dry.  Do not get this area wet for one week. No showers for one week; you may shower on   12/24/17  . - The tape/steri-strips on your wound will fall off; do not pull them off.  No bandage is needed on the site.  DO  NOT apply any creams, oils, or ointments to the wound area. - If you notice any drainage or discharge from the wound, any swelling or bruising at the site, or you develop a fever > 101? F after you are discharged home, call the office at once.  Special Instructions - You are still able to use cellular telephones; use the ear opposite the side where you have your pacemaker/defibrillator.  Avoid carrying your cellular phone near your device. - When traveling through airports, show security personnel your identification card to avoid being screened in the metal detectors.  Ask the security personnel to use the hand wand. - Avoid arc welding equipment, MRI testing (magnetic resonance imaging), TENS units (transcutaneous nerve stimulators).  Call the office for questions about other devices. - Avoid electrical appliances that are in poor condition or are not properly grounded. - Microwave ovens are safe to be near or to operate.

## 2017-12-17 NOTE — Discharge Summary (Addendum)
ELECTROPHYSIOLOGY PROCEDURE DISCHARGE SUMMARY    Patient ID: Ernest Parker,  MRN: [id number], DOB/AGE: Jul 30, 1934 84 y.o.  Admit date: 12/17/2017 Discharge date: 12/18/17  Primary Care Physician: Seward Carol, MD Electrophysiologist: Lovena Le  Primary Discharge Diagnosis:  1. Symptomatic Bradycardia 2. Tachy-brady syndrome  Secondary Discharge Diagnosis:  1.  Atrial flutter 2.  Atrial fibrillation      CHA2DS2Vasc is 3, on Eliquis, appropriately dosed for renal function and age 18.  Hypertension  Allergies  Allergen Reactions  . Penicillins Other (See Comments)    Patient can't remember reaction was told by mother was allergic as a child Has patient had a PCN reaction causing immediate rash, facial/tongue/throat swelling, SOB or lightheadedness with hypotension: Unknown Has patient had a PCN reaction causing severe rash involving mucus membranes or skin necrosis: Unknown Has patient had a PCN reaction that required hospitalization: Unknown Has patient had a PCN reaction occurring within the last 10 years: No If all of the above answers are "NO", then may pro     Procedures This Admission:  1.  Implantation of a STJ dual chamber PPM on 12/17/17 by Dr Lovena Le.  See op note for full details. There were no immediate post procedure complications. 2.  CXR on 12/18/17 demonstrated no pneumothorax status post device implantation.   Brief HPI: Ernest Parker is a 84 y.o. male was referred to electrophysiology in the outpatient setting for consideration of PPM implantation.  Past medical history includes atrial arrhythmias, sinus bradycardia.  The patient has had symptomatic bradycardia without reversible causes identified.  Risks, benefits, and alternatives to PPM implantation were reviewed with the patient who wished to proceed.   Hospital Course:  The patient was admitted and underwent implantation of a STJ dual chamber PPM with details as outlined above.  He  was monitored on telemetry  overnight which demonstrated AP/Vs >> AFib 90's-low 100's.  Left chest was without hematoma or ecchymosis.  The device was interrogated and found to be functioning normally.  CXR was obtained and demonstrated no pneumothorax status post device implantation, patient has no pulmonary symptoms, no abnormal lung exam findings were noted.    Wound care, arm mobility, and restrictions were reviewed with the patient.  The patient is feeling well his morning, minimal site discomfort, no CP or SOB, unaware of his Afib.  He was examined by Dr. Lovena Le and considered stable for discharge to home.    Physical Exam: BP (!) 138/97   Pulse 98   Temp 98.1 F (36.7 C) (Oral)   Resp (!) 21   Ht 5\' 10"  (1.778 m)   Wt 227 lb 1.2 oz (103 kg)   SpO2 93%   BMI 32.58 kg/m   GEN- The patient is well appearing, alert and oriented x 3 today.   HEENT: normocephalic, atraumatic; sclera clear, conjunctiva pink; hearing intact; oropharynx clear; neck supple  Lungs- CTA b/l, normal work of breathing.  No wheezes, rales, rhonchi Heart- iRRR, soft SM, no gallops or rubs GI- soft, non-tender, non-distended Extremities- no clubbing, cyanosis, or edema MS- no significant deformity or atrophy Skin- warm and dry, no rash or lesion, left chest without hematoma/ecchymosis Psych- euthymic mood, full affect Neuro- strength and sensation are intact   Labs:   Lab Results  Component Value Date   WBC 7.3 12/13/2017   HGB 13.1 12/13/2017   HCT 38.4 12/13/2017   MCV 88 12/13/2017   PLT 217 12/13/2017    Recent Labs  Lab 12/13/17 0751  NA 141  K 4.1  CL 104  CO2 23  BUN 19  CREATININE 1.70*  CALCIUM 9.0  GLUCOSE 109*    Discharge Medications:  Allergies as of 12/18/2017      Reactions   Penicillins Other (See Comments)   Patient can't remember reaction was told by mother was allergic as a child Has patient had a PCN reaction causing immediate rash, facial/tongue/throat swelling, SOB or lightheadedness with  hypotension: Unknown Has patient had a PCN reaction causing severe rash involving mucus membranes or skin necrosis: Unknown Has patient had a PCN reaction that required hospitalization: Unknown Has patient had a PCN reaction occurring within the last 10 years: No If all of the above answers are "NO", then may pro      Medication List    TAKE these medications   apixaban 2.5 MG Tabs tablet Commonly known as:  ELIQUIS Take 1 tablet (2.5 mg total) by mouth 2 (two) times daily. Notes to patient:  Do not take this morning, resume at tonight's dose   metoprolol succinate 50 MG 24 hr tablet Commonly known as:  TOPROL-XL Take 1 tablet (50 mg total) by mouth 2 (two) times daily. Take with or immediately following a meal.   Red Yeast Rice 600 MG Caps Take 600 mg by mouth daily.   Vitamin D3 2000 units Tabs Take 2,000 Units by mouth daily.       Disposition: Home   Discharge Instructions    Diet - low sodium heart healthy   Complete by:  As directed    Increase activity slowly   Complete by:  As directed      Follow-up Information    [facility] Follow up on 12/27/2017.   Specialty:  Cardiology Why:  at 12Noon Contact information: [address], [place] [address] [phone]          Duration of Discharge Encounter: Greater than 30 minutes including physician time.  Venetia Night, PA-C 12/18/2017 8:10 AM  EP Attending  Patient seen and examined. Agree with above. He is s/p DDD PM insertion due to prolonged pauses and is doinge well. He has reverted back to atrial fib. His HR is well controlled. He is asymptomatic. PPM interogation under my direction demonstrates normal device function. We will DC home with usual followup. We will restart his [place] tonight.  Mikle Bosworth.D.

## 2017-12-17 NOTE — Interval H&P Note (Signed)
History and Physical Interval Note:  12/17/2017 2:11 PM  Ernest Parker  has presented today for surgery, with the diagnosis of sinus node dysfunction  The various methods of treatment have been discussed with the patient and family. After consideration of risks, benefits and other options for treatment, the patient has consented to  Procedure(s): PACEMAKER IMPLANT (N/A) as a surgical intervention .  The patient's history has been reviewed, patient examined, no change in status, stable for surgery.  I have reviewed the patient's chart and labs.  Questions were answered to the patient's satisfaction.     Ernest Parker

## 2017-12-18 ENCOUNTER — Ambulatory Visit (HOSPITAL_COMMUNITY): Payer: Medicare Other

## 2017-12-18 ENCOUNTER — Telehealth: Payer: Self-pay

## 2017-12-18 ENCOUNTER — Encounter (HOSPITAL_COMMUNITY): Payer: Self-pay | Admitting: Internal Medicine

## 2017-12-18 DIAGNOSIS — Z950 Presence of cardiac pacemaker: Secondary | ICD-10-CM | POA: Diagnosis not present

## 2017-12-18 DIAGNOSIS — I495 Sick sinus syndrome: Secondary | ICD-10-CM

## 2017-12-18 DIAGNOSIS — Z880 Allergy status to penicillin: Secondary | ICD-10-CM | POA: Diagnosis not present

## 2017-12-18 DIAGNOSIS — I4892 Unspecified atrial flutter: Secondary | ICD-10-CM | POA: Diagnosis not present

## 2017-12-18 DIAGNOSIS — I4891 Unspecified atrial fibrillation: Secondary | ICD-10-CM | POA: Diagnosis not present

## 2017-12-18 DIAGNOSIS — I10 Essential (primary) hypertension: Secondary | ICD-10-CM | POA: Diagnosis not present

## 2017-12-18 DIAGNOSIS — Z7901 Long term (current) use of anticoagulants: Secondary | ICD-10-CM | POA: Diagnosis not present

## 2017-12-18 MED ORDER — OFF THE BEAT BOOK
Freq: Once | Status: AC
  Administered 2017-12-18: 01:00:00 1
  Filled 2017-12-18: qty 1

## 2017-12-18 MED ORDER — YOU HAVE A PACEMAKER BOOK
Freq: Once | Status: AC
  Administered 2017-12-18: 1
  Filled 2017-12-18: qty 1

## 2017-12-18 NOTE — Telephone Encounter (Signed)
Spoke with RU who states that patient lives in [place] during the winter and [place] during the summer. Therefor he will not be scheduling is 91 day follow up with implanting physician. - We will plan to schedule a three month remote check in substitution of the 91 day in office check.

## 2017-12-27 ENCOUNTER — Ambulatory Visit (INDEPENDENT_AMBULATORY_CARE_PROVIDER_SITE_OTHER): Payer: Medicare Other | Admitting: *Deleted

## 2017-12-27 DIAGNOSIS — I495 Sick sinus syndrome: Secondary | ICD-10-CM | POA: Diagnosis not present

## 2017-12-27 LAB — CUP PACEART INCLINIC DEVICE CHECK
Implantable Lead Location: 753860
Implantable Pulse Generator Implant Date: 20190408
Lead Channel Impedance Value: 512.5 Ohm
Lead Channel Impedance Value: 637.5 Ohm
Lead Channel Pacing Threshold Amplitude: 0.5 V
Lead Channel Pacing Threshold Amplitude: 0.75 V
Lead Channel Pacing Threshold Amplitude: 0.75 V
Lead Channel Pacing Threshold Pulse Width: 0.5 ms
Lead Channel Pacing Threshold Pulse Width: 0.5 ms
Lead Channel Pacing Threshold Pulse Width: 0.5 ms
Lead Channel Sensing Intrinsic Amplitude: 12 mV
Lead Channel Sensing Intrinsic Amplitude: 2.4 mV
Lead Channel Setting Pacing Amplitude: 3.5 V
Lead Channel Setting Pacing Pulse Width: 0.5 ms
MDC IDC LEAD IMPLANT DT: 20190408
MDC IDC LEAD IMPLANT DT: 20190408
MDC IDC LEAD LOCATION: 753859
MDC IDC MSMT BATTERY REMAINING LONGEVITY: 87 mo
MDC IDC MSMT BATTERY VOLTAGE: 3.01 V
MDC IDC MSMT LEADCHNL RA PACING THRESHOLD AMPLITUDE: 0.5 V
MDC IDC MSMT LEADCHNL RA PACING THRESHOLD PULSEWIDTH: 0.5 ms
MDC IDC PG SERIAL: 9004195
MDC IDC SESS DTM: 20190418141310
MDC IDC SET LEADCHNL RA PACING AMPLITUDE: 3.5 V
MDC IDC SET LEADCHNL RV SENSING SENSITIVITY: 2 mV
MDC IDC STAT BRADY RA PERCENT PACED: 84 %
MDC IDC STAT BRADY RV PERCENT PACED: 0.24 %

## 2017-12-27 NOTE — Progress Notes (Signed)
Wound check appointment. Steri-strips removed. Wound without redness or edema. Incision edges approximated, wound well healed. Normal device function. Thresholds, sensing, and impedances consistent with implant measurements. Device programmed at 3.5V programmed on for extra safety margin. Histogram distribution appropriate for patient and level of activity. 7.5 % AT/Af + Eliquis. No high ventricular rates noted. Patient educated about wound care, arm mobility, lifting restrictions. Pt returning to [place] for 6 months strongly advised pt that he needed to establish with an electrophysiologist in [place] to have his device checked at the 3 month point and if any issues arise with his pacemaker while he is [place].  Remote 03/28/18. ROV w/ GT 12 months.

## 2018-03-02 DIAGNOSIS — I7410 Embolism and thrombosis of unspecified parts of aorta: Secondary | ICD-10-CM | POA: Diagnosis not present

## 2018-03-02 DIAGNOSIS — N23 Unspecified renal colic: Secondary | ICD-10-CM | POA: Diagnosis not present

## 2018-03-02 DIAGNOSIS — I10 Essential (primary) hypertension: Secondary | ICD-10-CM | POA: Diagnosis not present

## 2018-03-02 DIAGNOSIS — I714 Abdominal aortic aneurysm, without rupture: Secondary | ICD-10-CM | POA: Diagnosis not present

## 2018-03-02 DIAGNOSIS — K829 Disease of gallbladder, unspecified: Secondary | ICD-10-CM | POA: Diagnosis present

## 2018-03-02 DIAGNOSIS — R339 Retention of urine, unspecified: Secondary | ICD-10-CM | POA: Diagnosis present

## 2018-03-02 DIAGNOSIS — R000 Tachycardia, unspecified: Secondary | ICD-10-CM | POA: Diagnosis not present

## 2018-03-02 DIAGNOSIS — K8020 Calculus of gallbladder without cholecystitis without obstruction: Secondary | ICD-10-CM | POA: Diagnosis not present

## 2018-03-02 DIAGNOSIS — I701 Atherosclerosis of renal artery: Secondary | ICD-10-CM | POA: Diagnosis not present

## 2018-03-02 DIAGNOSIS — I161 Hypertensive emergency: Secondary | ICD-10-CM | POA: Diagnosis not present

## 2018-03-02 DIAGNOSIS — I498 Other specified cardiac arrhythmias: Secondary | ICD-10-CM | POA: Diagnosis not present

## 2018-03-02 DIAGNOSIS — I517 Cardiomegaly: Secondary | ICD-10-CM | POA: Diagnosis not present

## 2018-03-02 DIAGNOSIS — R932 Abnormal findings on diagnostic imaging of liver and biliary tract: Secondary | ICD-10-CM | POA: Diagnosis not present

## 2018-03-02 DIAGNOSIS — R1084 Generalized abdominal pain: Secondary | ICD-10-CM | POA: Diagnosis not present

## 2018-03-02 DIAGNOSIS — N189 Chronic kidney disease, unspecified: Secondary | ICD-10-CM | POA: Diagnosis present

## 2018-03-02 DIAGNOSIS — I459 Conduction disorder, unspecified: Secondary | ICD-10-CM | POA: Diagnosis not present

## 2018-03-02 DIAGNOSIS — I4891 Unspecified atrial fibrillation: Secondary | ICD-10-CM | POA: Diagnosis not present

## 2018-03-02 DIAGNOSIS — Z8546 Personal history of malignant neoplasm of prostate: Secondary | ICD-10-CM | POA: Diagnosis not present

## 2018-03-02 DIAGNOSIS — I129 Hypertensive chronic kidney disease with stage 1 through stage 4 chronic kidney disease, or unspecified chronic kidney disease: Secondary | ICD-10-CM | POA: Diagnosis present

## 2018-03-02 DIAGNOSIS — K828 Other specified diseases of gallbladder: Secondary | ICD-10-CM | POA: Diagnosis not present

## 2018-03-02 DIAGNOSIS — Z87891 Personal history of nicotine dependence: Secondary | ICD-10-CM | POA: Diagnosis not present

## 2018-03-02 DIAGNOSIS — I4581 Long QT syndrome: Secondary | ICD-10-CM | POA: Diagnosis not present

## 2018-03-02 DIAGNOSIS — Z950 Presence of cardiac pacemaker: Secondary | ICD-10-CM | POA: Diagnosis not present

## 2018-03-07 DIAGNOSIS — I482 Chronic atrial fibrillation: Secondary | ICD-10-CM | POA: Diagnosis present

## 2018-03-07 DIAGNOSIS — J90 Pleural effusion, not elsewhere classified: Secondary | ICD-10-CM | POA: Diagnosis not present

## 2018-03-07 DIAGNOSIS — Z87891 Personal history of nicotine dependence: Secondary | ICD-10-CM | POA: Diagnosis not present

## 2018-03-07 DIAGNOSIS — N401 Enlarged prostate with lower urinary tract symptoms: Secondary | ICD-10-CM | POA: Diagnosis present

## 2018-03-07 DIAGNOSIS — I5033 Acute on chronic diastolic (congestive) heart failure: Secondary | ICD-10-CM | POA: Diagnosis not present

## 2018-03-07 DIAGNOSIS — R079 Chest pain, unspecified: Secondary | ICD-10-CM | POA: Diagnosis not present

## 2018-03-07 DIAGNOSIS — Z950 Presence of cardiac pacemaker: Secondary | ICD-10-CM | POA: Diagnosis not present

## 2018-03-07 DIAGNOSIS — R338 Other retention of urine: Secondary | ICD-10-CM | POA: Diagnosis present

## 2018-03-07 DIAGNOSIS — R9431 Abnormal electrocardiogram [ECG] [EKG]: Secondary | ICD-10-CM | POA: Diagnosis not present

## 2018-03-07 DIAGNOSIS — I517 Cardiomegaly: Secondary | ICD-10-CM | POA: Diagnosis not present

## 2018-03-07 DIAGNOSIS — I110 Hypertensive heart disease with heart failure: Secondary | ICD-10-CM | POA: Diagnosis present

## 2018-03-07 DIAGNOSIS — Z7901 Long term (current) use of anticoagulants: Secondary | ICD-10-CM | POA: Diagnosis not present

## 2018-03-07 DIAGNOSIS — R0902 Hypoxemia: Secondary | ICD-10-CM | POA: Diagnosis not present

## 2018-03-07 DIAGNOSIS — I4581 Long QT syndrome: Secondary | ICD-10-CM | POA: Diagnosis not present

## 2018-03-07 DIAGNOSIS — I480 Paroxysmal atrial fibrillation: Secondary | ICD-10-CM | POA: Diagnosis not present

## 2018-03-07 DIAGNOSIS — J9601 Acute respiratory failure with hypoxia: Secondary | ICD-10-CM | POA: Diagnosis present

## 2018-03-07 DIAGNOSIS — R339 Retention of urine, unspecified: Secondary | ICD-10-CM | POA: Diagnosis not present

## 2018-03-07 DIAGNOSIS — I4891 Unspecified atrial fibrillation: Secondary | ICD-10-CM | POA: Diagnosis not present

## 2018-03-07 DIAGNOSIS — R109 Unspecified abdominal pain: Secondary | ICD-10-CM | POA: Diagnosis not present

## 2018-03-07 DIAGNOSIS — R0602 Shortness of breath: Secondary | ICD-10-CM | POA: Diagnosis not present

## 2018-03-07 DIAGNOSIS — N179 Acute kidney failure, unspecified: Secondary | ICD-10-CM | POA: Diagnosis present

## 2018-03-07 DIAGNOSIS — R1031 Right lower quadrant pain: Secondary | ICD-10-CM | POA: Diagnosis not present

## 2018-03-07 DIAGNOSIS — K828 Other specified diseases of gallbladder: Secondary | ICD-10-CM | POA: Diagnosis not present

## 2018-03-07 DIAGNOSIS — I10 Essential (primary) hypertension: Secondary | ICD-10-CM | POA: Diagnosis not present

## 2018-03-11 DIAGNOSIS — Z466 Encounter for fitting and adjustment of urinary device: Secondary | ICD-10-CM | POA: Diagnosis not present

## 2018-03-11 DIAGNOSIS — Z950 Presence of cardiac pacemaker: Secondary | ICD-10-CM | POA: Diagnosis not present

## 2018-03-11 DIAGNOSIS — Z87891 Personal history of nicotine dependence: Secondary | ICD-10-CM | POA: Diagnosis not present

## 2018-03-11 DIAGNOSIS — Z602 Problems related to living alone: Secondary | ICD-10-CM | POA: Diagnosis not present

## 2018-03-11 DIAGNOSIS — Z7901 Long term (current) use of anticoagulants: Secondary | ICD-10-CM | POA: Diagnosis not present

## 2018-03-11 DIAGNOSIS — R339 Retention of urine, unspecified: Secondary | ICD-10-CM | POA: Diagnosis not present

## 2018-03-11 DIAGNOSIS — Z8546 Personal history of malignant neoplasm of prostate: Secondary | ICD-10-CM | POA: Diagnosis not present

## 2018-03-11 DIAGNOSIS — K819 Cholecystitis, unspecified: Secondary | ICD-10-CM | POA: Diagnosis not present

## 2018-03-11 DIAGNOSIS — Z7982 Long term (current) use of aspirin: Secondary | ICD-10-CM | POA: Diagnosis not present

## 2018-03-11 DIAGNOSIS — I5033 Acute on chronic diastolic (congestive) heart failure: Secondary | ICD-10-CM | POA: Diagnosis not present

## 2018-03-11 DIAGNOSIS — Z48812 Encounter for surgical aftercare following surgery on the circulatory system: Secondary | ICD-10-CM | POA: Diagnosis not present

## 2018-03-11 DIAGNOSIS — I4891 Unspecified atrial fibrillation: Secondary | ICD-10-CM | POA: Diagnosis not present

## 2018-03-11 HISTORY — PX: THORACIC AORTIC ANEURYSM REPAIR: SHX799

## 2018-03-12 ENCOUNTER — Telehealth: Payer: Self-pay | Admitting: Internal Medicine

## 2018-03-12 DIAGNOSIS — N401 Enlarged prostate with lower urinary tract symptoms: Secondary | ICD-10-CM | POA: Diagnosis not present

## 2018-03-12 DIAGNOSIS — R338 Other retention of urine: Secondary | ICD-10-CM | POA: Diagnosis not present

## 2018-03-12 NOTE — Telephone Encounter (Signed)
New Message   Pt's daughter is calling states pt had a pacemaker that was recently put in and now he is in [place] and had surgery for an aortic aneurysm  and she wants to speak with a nurse to find out what they should do next

## 2018-03-12 NOTE — Telephone Encounter (Signed)
Returned call to daughter.  Pt has had multiple admissions since pacemaker placement after going to [place] to see his daughter.  Pt hospitalized for aortic aneurysm requiring surgery. Pt was discharged post surgery and then was readmitted for urinary retention with catheter placed. Pt also having issues with gallbladder which is being monitored.  Pt will be back to [place] end of July. Made appt for July 31st with Dr. Lovena Le.  Request for records given to [facility] to acquire prior to visit.  No further needs at this time.

## 2018-03-15 ENCOUNTER — Telehealth: Payer: Self-pay | Admitting: Internal Medicine

## 2018-03-15 DIAGNOSIS — I5033 Acute on chronic diastolic (congestive) heart failure: Secondary | ICD-10-CM | POA: Diagnosis not present

## 2018-03-15 DIAGNOSIS — R338 Other retention of urine: Secondary | ICD-10-CM | POA: Diagnosis not present

## 2018-03-15 DIAGNOSIS — Z466 Encounter for fitting and adjustment of urinary device: Secondary | ICD-10-CM | POA: Diagnosis not present

## 2018-03-15 DIAGNOSIS — Z48812 Encounter for surgical aftercare following surgery on the circulatory system: Secondary | ICD-10-CM | POA: Diagnosis not present

## 2018-03-15 DIAGNOSIS — N401 Enlarged prostate with lower urinary tract symptoms: Secondary | ICD-10-CM | POA: Diagnosis not present

## 2018-03-15 DIAGNOSIS — R339 Retention of urine, unspecified: Secondary | ICD-10-CM | POA: Diagnosis not present

## 2018-03-15 DIAGNOSIS — I4891 Unspecified atrial fibrillation: Secondary | ICD-10-CM | POA: Diagnosis not present

## 2018-03-15 DIAGNOSIS — K819 Cholecystitis, unspecified: Secondary | ICD-10-CM | POA: Diagnosis not present

## 2018-03-15 MED ORDER — FUROSEMIDE 40 MG PO TABS: 40.0000 mg | ORAL_TABLET | Freq: Every day | ORAL | 0 refills | Status: DC

## 2018-03-15 MED ORDER — METOPROLOL SUCCINATE ER 100 MG PO TB24: 100.0000 mg | ORAL_TABLET | Freq: Two times a day (BID) | ORAL | 0 refills | Status: DC

## 2018-03-15 MED ORDER — TAMSULOSIN HCL 0.4 MG PO CAPS: 0.4000 mg | ORAL_CAPSULE | Freq: Every day | ORAL | 0 refills | Status: DC

## 2018-03-15 MED ORDER — DILTIAZEM HCL ER COATED BEADS 180 MG PO CP24: 180.0000 mg | ORAL_CAPSULE | Freq: Every day | ORAL | 0 refills | Status: DC

## 2018-03-15 NOTE — Telephone Encounter (Signed)
New Message:      Pt's daughter is calling to speak with RN about the pt's medication.

## 2018-03-15 NOTE — Telephone Encounter (Signed)
Call returned to daughter.  Daughter asking if Pt's medication can be filled for short period of time by Dr. Lovena Le d/t Pt still in [place].  Filled medications for 30 days.  Pt with appt with Dr. Lovena Le 04/10/2018.  Further refills to be addressed at that time.

## 2018-03-19 DIAGNOSIS — Z466 Encounter for fitting and adjustment of urinary device: Secondary | ICD-10-CM | POA: Diagnosis not present

## 2018-03-19 DIAGNOSIS — I4891 Unspecified atrial fibrillation: Secondary | ICD-10-CM | POA: Diagnosis not present

## 2018-03-19 DIAGNOSIS — Z48812 Encounter for surgical aftercare following surgery on the circulatory system: Secondary | ICD-10-CM | POA: Diagnosis not present

## 2018-03-19 DIAGNOSIS — K819 Cholecystitis, unspecified: Secondary | ICD-10-CM | POA: Diagnosis not present

## 2018-03-19 DIAGNOSIS — I5033 Acute on chronic diastolic (congestive) heart failure: Secondary | ICD-10-CM | POA: Diagnosis not present

## 2018-03-19 DIAGNOSIS — R339 Retention of urine, unspecified: Secondary | ICD-10-CM | POA: Diagnosis not present

## 2018-03-20 DIAGNOSIS — K8020 Calculus of gallbladder without cholecystitis without obstruction: Secondary | ICD-10-CM | POA: Diagnosis not present

## 2018-03-20 DIAGNOSIS — Z9889 Other specified postprocedural states: Secondary | ICD-10-CM | POA: Diagnosis not present

## 2018-03-21 DIAGNOSIS — Z466 Encounter for fitting and adjustment of urinary device: Secondary | ICD-10-CM | POA: Diagnosis not present

## 2018-03-21 DIAGNOSIS — Z48812 Encounter for surgical aftercare following surgery on the circulatory system: Secondary | ICD-10-CM | POA: Diagnosis not present

## 2018-03-21 DIAGNOSIS — K819 Cholecystitis, unspecified: Secondary | ICD-10-CM | POA: Diagnosis not present

## 2018-03-21 DIAGNOSIS — I4891 Unspecified atrial fibrillation: Secondary | ICD-10-CM | POA: Diagnosis not present

## 2018-03-21 DIAGNOSIS — R339 Retention of urine, unspecified: Secondary | ICD-10-CM | POA: Diagnosis not present

## 2018-03-21 DIAGNOSIS — I5033 Acute on chronic diastolic (congestive) heart failure: Secondary | ICD-10-CM | POA: Diagnosis not present

## 2018-03-22 DIAGNOSIS — Z9889 Other specified postprocedural states: Secondary | ICD-10-CM | POA: Diagnosis not present

## 2018-03-22 DIAGNOSIS — I714 Abdominal aortic aneurysm, without rupture: Secondary | ICD-10-CM | POA: Diagnosis not present

## 2018-03-22 DIAGNOSIS — R0989 Other specified symptoms and signs involving the circulatory and respiratory systems: Secondary | ICD-10-CM | POA: Diagnosis not present

## 2018-03-22 DIAGNOSIS — Z95828 Presence of other vascular implants and grafts: Secondary | ICD-10-CM | POA: Diagnosis not present

## 2018-03-28 ENCOUNTER — Ambulatory Visit (INDEPENDENT_AMBULATORY_CARE_PROVIDER_SITE_OTHER): Payer: Medicare Other | Admitting: *Deleted

## 2018-03-28 DIAGNOSIS — I495 Sick sinus syndrome: Secondary | ICD-10-CM | POA: Diagnosis not present

## 2018-03-28 NOTE — Progress Notes (Signed)
Remote pacemaker transmission.   

## 2018-03-29 ENCOUNTER — Encounter: Payer: Self-pay | Admitting: Cardiology

## 2018-03-29 DIAGNOSIS — I5033 Acute on chronic diastolic (congestive) heart failure: Secondary | ICD-10-CM | POA: Diagnosis not present

## 2018-03-29 DIAGNOSIS — Z466 Encounter for fitting and adjustment of urinary device: Secondary | ICD-10-CM | POA: Diagnosis not present

## 2018-03-29 DIAGNOSIS — K819 Cholecystitis, unspecified: Secondary | ICD-10-CM | POA: Diagnosis not present

## 2018-03-29 DIAGNOSIS — Z48812 Encounter for surgical aftercare following surgery on the circulatory system: Secondary | ICD-10-CM | POA: Diagnosis not present

## 2018-03-29 DIAGNOSIS — I4891 Unspecified atrial fibrillation: Secondary | ICD-10-CM | POA: Diagnosis not present

## 2018-03-29 DIAGNOSIS — R339 Retention of urine, unspecified: Secondary | ICD-10-CM | POA: Diagnosis not present

## 2018-04-02 DIAGNOSIS — R338 Other retention of urine: Secondary | ICD-10-CM | POA: Diagnosis not present

## 2018-04-02 DIAGNOSIS — N401 Enlarged prostate with lower urinary tract symptoms: Secondary | ICD-10-CM | POA: Diagnosis not present

## 2018-04-03 DIAGNOSIS — R9431 Abnormal electrocardiogram [ECG] [EKG]: Secondary | ICD-10-CM | POA: Diagnosis not present

## 2018-04-03 DIAGNOSIS — R002 Palpitations: Secondary | ICD-10-CM | POA: Diagnosis not present

## 2018-04-03 DIAGNOSIS — R338 Other retention of urine: Secondary | ICD-10-CM | POA: Diagnosis not present

## 2018-04-03 DIAGNOSIS — Z45018 Encounter for adjustment and management of other part of cardiac pacemaker: Secondary | ICD-10-CM | POA: Diagnosis not present

## 2018-04-03 DIAGNOSIS — I495 Sick sinus syndrome: Secondary | ICD-10-CM | POA: Diagnosis not present

## 2018-04-03 DIAGNOSIS — I480 Paroxysmal atrial fibrillation: Secondary | ICD-10-CM | POA: Diagnosis not present

## 2018-04-03 DIAGNOSIS — I10 Essential (primary) hypertension: Secondary | ICD-10-CM | POA: Diagnosis not present

## 2018-04-03 DIAGNOSIS — Z950 Presence of cardiac pacemaker: Secondary | ICD-10-CM | POA: Diagnosis not present

## 2018-04-05 ENCOUNTER — Telehealth: Payer: Self-pay | Admitting: Internal Medicine

## 2018-04-05 NOTE — Telephone Encounter (Signed)
Called pt daughter and left message informing her per Willeen Cass, RN Damian Leavell, RN       03/15/18 1:57 PM  Note    Call returned to daughter.  Daughter asking if Pt's medication can be filled for short period of time by Dr. Lovena Le d/t Pt still in [place].  Filled medications for 30 days.  Pt with appt with Dr. Lovena Le 04/10/2018.  Further refills to be addressed at that time.      I stated that if she has any other problems, questions or concerns to call the office.

## 2018-04-05 NOTE — Telephone Encounter (Signed)
°*  STAT* If patient is at the pharmacy, call can be transferred to refill team.   1. Which medications need to be refilled? (please list name of each medication and dose if known) LASIX 40mg   Pt doubled up   2. Which pharmacy/location (including street and city if local pharmacy) is medication to be sent to? [phone]  1. Do they need a 30 day or 90 day supply? 4 days will refill scprit 7/29

## 2018-04-09 LAB — CUP PACEART REMOTE DEVICE CHECK
Battery Remaining Longevity: 69 mo
Battery Remaining Percentage: 95.5 %
Battery Voltage: 3.01 V
Brady Statistic AP VP Percent: 1 %
Brady Statistic AP VS Percent: 79 %
Brady Statistic RA Percent Paced: 66 %
Date Time Interrogation Session: 20190718060033
Implantable Lead Location: 753860
Lead Channel Impedance Value: 480 Ohm
Lead Channel Impedance Value: 480 Ohm
Lead Channel Pacing Threshold Amplitude: 1 V
Lead Channel Pacing Threshold Pulse Width: 0.5 ms
Lead Channel Sensing Intrinsic Amplitude: 12 mV
Lead Channel Setting Pacing Amplitude: 3.5 V
Lead Channel Setting Pacing Amplitude: 3.5 V
MDC IDC LEAD IMPLANT DT: 20190408
MDC IDC LEAD IMPLANT DT: 20190408
MDC IDC LEAD LOCATION: 753859
MDC IDC MSMT LEADCHNL RA PACING THRESHOLD AMPLITUDE: 0.75 V
MDC IDC MSMT LEADCHNL RA SENSING INTR AMPL: 2.4 mV
MDC IDC MSMT LEADCHNL RV PACING THRESHOLD PULSEWIDTH: 0.5 ms
MDC IDC PG IMPLANT DT: 20190408
MDC IDC SET LEADCHNL RV PACING PULSEWIDTH: 0.5 ms
MDC IDC SET LEADCHNL RV SENSING SENSITIVITY: 2 mV
MDC IDC STAT BRADY AS VP PERCENT: 1 %
MDC IDC STAT BRADY AS VS PERCENT: 20 %
MDC IDC STAT BRADY RV PERCENT PACED: 3.2 %
Pulse Gen Serial Number: 9004195

## 2018-04-10 ENCOUNTER — Ambulatory Visit (INDEPENDENT_AMBULATORY_CARE_PROVIDER_SITE_OTHER): Payer: Medicare Other | Admitting: Internal Medicine

## 2018-04-10 ENCOUNTER — Encounter: Payer: Self-pay | Admitting: Internal Medicine

## 2018-04-10 VITALS — BP 130/72 | HR 60 | Ht 70.0 in | Wt 213.4 lb

## 2018-04-10 DIAGNOSIS — Z950 Presence of cardiac pacemaker: Secondary | ICD-10-CM

## 2018-04-10 DIAGNOSIS — I495 Sick sinus syndrome: Secondary | ICD-10-CM | POA: Diagnosis not present

## 2018-04-10 DIAGNOSIS — I10 Essential (primary) hypertension: Secondary | ICD-10-CM | POA: Diagnosis not present

## 2018-04-10 DIAGNOSIS — I480 Paroxysmal atrial fibrillation: Secondary | ICD-10-CM | POA: Diagnosis not present

## 2018-04-10 DIAGNOSIS — I4892 Unspecified atrial flutter: Secondary | ICD-10-CM | POA: Diagnosis not present

## 2018-04-10 LAB — CUP PACEART INCLINIC DEVICE CHECK
Battery Voltage: 3.01 V
Brady Statistic RV Percent Paced: 3.6 %
Implantable Lead Implant Date: 20190408
Implantable Lead Location: 753860
Implantable Pulse Generator Implant Date: 20190408
Lead Channel Impedance Value: 462.5 Ohm
Lead Channel Impedance Value: 475 Ohm
Lead Channel Pacing Threshold Amplitude: 0.75 V
Lead Channel Pacing Threshold Pulse Width: 0.5 ms
Lead Channel Setting Pacing Amplitude: 1.625
Lead Channel Setting Pacing Pulse Width: 0.5 ms
MDC IDC LEAD IMPLANT DT: 20190408
MDC IDC LEAD LOCATION: 753859
MDC IDC MSMT BATTERY REMAINING LONGEVITY: 117 mo
MDC IDC MSMT LEADCHNL RA PACING THRESHOLD AMPLITUDE: 0.625 V
MDC IDC MSMT LEADCHNL RA PACING THRESHOLD PULSEWIDTH: 0.5 ms
MDC IDC MSMT LEADCHNL RA SENSING INTR AMPL: 1.9 mV
MDC IDC MSMT LEADCHNL RV SENSING INTR AMPL: 11.7 mV
MDC IDC SESS DTM: 20190731110507
MDC IDC SET LEADCHNL RV PACING AMPLITUDE: 1 V
MDC IDC SET LEADCHNL RV SENSING SENSITIVITY: 2 mV
MDC IDC STAT BRADY RA PERCENT PACED: 67 %
Pulse Gen Serial Number: 9004195

## 2018-04-10 NOTE — Progress Notes (Signed)
HPI [name] returns today for followup of his PPM, s/p implant. He has HTN and sinus node dysfunction. After undergoing PPM insertion several months ago, he went back to [place] and was found to have an aneurysm. He underwent surgery which was complicated by urinary retention.  Allergies  Allergen Reactions  . Penicillins Other (See Comments)    Patient can't remember reaction was told by mother was allergic as a child Has patient had a PCN reaction causing immediate rash, facial/tongue/throat swelling, SOB or lightheadedness with hypotension: Unknown Has patient had a PCN reaction causing severe rash involving mucus membranes or skin necrosis: Unknown Has patient had a PCN reaction that required hospitalization: Unknown Has patient had a PCN reaction occurring within the last 10 years: No If all of the above answers are "NO", then may pro     Current Outpatient Medications  Medication Sig Dispense Refill  . apixaban (ELIQUIS) 2.5 MG TABS tablet Take 1 tablet (2.5 mg total) by mouth 2 (two) times daily. 60 tablet 11  . aspirin 81 MG tablet Take 1 tablet by mouth daily.    . Cholecalciferol (VITAMIN D3) 2000 units TABS Take 2,000 Units by mouth daily.     Marland Kitchen diltiazem (CARDIZEM CD) 180 MG 24 hr capsule Take 1 capsule (180 mg total) by mouth daily. 30 capsule 0  . furosemide (LASIX) 40 MG tablet Take 1 tablet (40 mg total) by mouth daily. 30 tablet 0  . metoprolol succinate (TOPROL-XL) 100 MG 24 hr tablet Take 1 tablet (100 mg total) by mouth 2 (two) times daily. Take with or immediately following a meal. 60 tablet 0  . Red Yeast Rice 600 MG CAPS Take 600 mg by mouth daily.     . tamsulosin (FLOMAX) 0.4 MG CAPS capsule Take 1 capsule (0.4 mg total) by mouth daily after supper. 30 capsule 0   No current facility-administered medications for this visit.      Past Medical History:  Diagnosis Date  . A-fib ([place])   . Dizzy   . Dyspnea   . GERD (gastroesophageal reflux  disease)   . Hard of hearing    wears hearing aids bilat  . High cholesterol   . Hypertension   . Presence of permanent cardiac pacemaker 12/17/2017  . Prostate cancer ([place]) 2002   S/P "radiation for 8-9 weeks"  . SOB (shortness of breath)     ROS:   All systems reviewed and negative except as noted in the HPI.   Past Surgical History:  Procedure Laterality Date  . INGUINAL HERNIA REPAIR Bilateral   . INSERT / REPLACE / REMOVE PACEMAKER  12/17/2017  . PACEMAKER IMPLANT N/A 12/17/2017   Procedure: PACEMAKER IMPLANT;  Surgeon: Evans Lance, MD;  Location: [facility];  Service: Cardiovascular;  Laterality: N/A;  . PROSTATE BIOPSY       Family History  Problem Relation Age of Onset  . Hypertension Mother   . Tuberculosis Father   . Kidney disease Sister   . Sleep apnea Sister      Social History   Socioeconomic History  . Marital status: Widowed    Spouse name: Not on file  . Number of children: Not on file  . Years of education: Not on file  . Highest education level: Not on file  Occupational History  . Occupation: RETIRED  Social Needs  . Financial resource strain: Not on file  . Food insecurity:    Worry: Not on  file    Inability: Not on file  . Transportation needs:    Medical: Not on file    Non-medical: Not on file  Tobacco Use  . Smoking status: Former Smoker    Packs/day: 0.50    Years: 30.00    Pack years: 15.00    Types: Cigarettes    Last attempt to quit: 1980    Years since quitting: 39.6  . Smokeless tobacco: Never Used  Substance and Sexual Activity  . Alcohol use: Yes    Alcohol/week: 0.6 oz    Types: 1 Cans of beer per week  . Drug use: No  . Sexual activity: Not on file  Lifestyle  . Physical activity:    Days per week: Not on file    Minutes per session: Not on file  . Stress: Not on file  Relationships  . Social connections:    Talks on phone: Not on file    Gets together: Not on file    Attends religious service:  Not on file    Active member of club or organization: Not on file    Attends meetings of clubs or organizations: Not on file    Relationship status: Not on file  . Intimate partner violence:    Fear of current or ex partner: Not on file    Emotionally abused: Not on file    Physically abused: Not on file    Forced sexual activity: Not on file  Other Topics Concern  . 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 Lives alone during the summer in [place] and lives with daughter during the Winter in [place], moving to [place]. Has 1 daughter and 2 sons. One sone lives in [place] and one in [place].   Is an avid golfer, plays 4-5 days per week. Likes to do yardwork.      BP 130/72   Pulse 60   Ht 5\' 10"  (1.778 m)   Wt 213 lb 6.4 oz (96.8 kg)   SpO2 96%   BMI 30.62 kg/m   Physical Exam:  Well appearing NAD HEENT: Unremarkable Neck:  No JVD, no thyromegally Lymphatics:  No adenopathy Back:  No CVA tenderness Lungs:  Clear HEART:  Regular rate rhythm, no murmurs, no rubs, no clicks Abd:  soft, positive bowel sounds, no organomegally, no rebound, no guarding Ext:  2 plus pulses, no edema, no cyanosis, no clubbing Skin:  No rashes no nodules Neuro:  CN II through XII intact, motor grossly intact  EKG  DEVICE  Normal device function.  See PaceArt for details.   Assess/Plan: 1. PAF - he is minimally symptomatic. He will continue his metoprolol and cardizem. He will continue his current meds. 2. Sinus node dysfunction - he is asymptomatic s/p PPM insertion.  3. PPM - his St. Jude DDD PM is working normally. Will recheck in several months.   Mikle Bosworth.D.

## 2018-04-10 NOTE — Patient Instructions (Signed)
Medication Instructions:  Your physician recommends that you continue on your current medications as directed. Please refer to the Current Medication list given to you today.  Labwork: None ordered.  Testing/Procedures: None ordered.  Follow-Up: Your physician wants you to follow-up in: 9 months with Dr. Lovena Le.   You will receive a reminder letter in the mail two months in advance. If you don't receive a letter, please call our office to schedule the follow-up appointment.  Remote monitoring is used to monitor your Pacemaker from home. This monitoring reduces the number of office visits required to check your device to one time per year. It allows [place] to keep an eye on the functioning of your device to ensure it is working properly. You are scheduled for a device check from home on 06/27/2018. You may send your transmission at any time that day. If you have a wireless device, the transmission will be sent automatically. After your physician reviews your transmission, you will receive a postcard with your next transmission date.  Any Other Special Instructions Will Be Listed Below (If Applicable).  If you need a refill on your cardiac medications before your next appointment, please call your pharmacy.

## 2018-04-12 DIAGNOSIS — R338 Other retention of urine: Secondary | ICD-10-CM | POA: Diagnosis not present

## 2018-04-12 DIAGNOSIS — C61 Malignant neoplasm of prostate: Secondary | ICD-10-CM | POA: Diagnosis not present

## 2018-04-15 ENCOUNTER — Other Ambulatory Visit: Payer: Self-pay | Admitting: Internal Medicine

## 2018-04-16 DIAGNOSIS — R338 Other retention of urine: Secondary | ICD-10-CM | POA: Diagnosis not present

## 2018-04-16 DIAGNOSIS — Z8546 Personal history of malignant neoplasm of prostate: Secondary | ICD-10-CM | POA: Diagnosis not present

## 2018-04-17 DIAGNOSIS — R338 Other retention of urine: Secondary | ICD-10-CM | POA: Diagnosis not present

## 2018-04-29 ENCOUNTER — Other Ambulatory Visit: Payer: Self-pay | Admitting: Internal Medicine

## 2018-04-30 ENCOUNTER — Other Ambulatory Visit: Payer: Self-pay | Admitting: Internal Medicine

## 2018-04-30 NOTE — Telephone Encounter (Signed)
Outpatient Medication Detail    Disp Refills Start End   diltiazem (CARDIZEM CD) 180 MG 24 hr capsule 90 capsule 3 04/30/2018    Sig - Route: Take 1 capsule (180 mg total) by mouth daily. - Oral   Sent to pharmacy as: diltiazem (CARDIZEM CD) 180 MG 24 hr capsule   E-Prescribing Status: Receipt confirmed by pharmacy (04/30/2018 11:16 AM EDT)   Pharmacy   [id number] [place]

## 2018-05-03 ENCOUNTER — Other Ambulatory Visit: Payer: Self-pay | Admitting: Internal Medicine

## 2018-05-07 ENCOUNTER — Other Ambulatory Visit: Payer: Self-pay | Admitting: Internal Medicine

## 2018-05-09 DIAGNOSIS — R338 Other retention of urine: Secondary | ICD-10-CM | POA: Diagnosis not present

## 2018-05-14 DIAGNOSIS — Z9889 Other specified postprocedural states: Secondary | ICD-10-CM | POA: Diagnosis not present

## 2018-05-14 DIAGNOSIS — I4891 Unspecified atrial fibrillation: Secondary | ICD-10-CM | POA: Diagnosis not present

## 2018-05-14 DIAGNOSIS — R7301 Impaired fasting glucose: Secondary | ICD-10-CM | POA: Diagnosis not present

## 2018-05-14 DIAGNOSIS — Z8546 Personal history of malignant neoplasm of prostate: Secondary | ICD-10-CM | POA: Diagnosis not present

## 2018-05-14 DIAGNOSIS — R339 Retention of urine, unspecified: Secondary | ICD-10-CM | POA: Diagnosis not present

## 2018-05-14 DIAGNOSIS — I10 Essential (primary) hypertension: Secondary | ICD-10-CM | POA: Diagnosis not present

## 2018-05-23 DIAGNOSIS — R338 Other retention of urine: Secondary | ICD-10-CM | POA: Diagnosis not present

## 2018-05-27 ENCOUNTER — Other Ambulatory Visit: Payer: Self-pay | Admitting: Internal Medicine

## 2018-05-28 DIAGNOSIS — Z23 Encounter for immunization: Secondary | ICD-10-CM | POA: Diagnosis not present

## 2018-06-07 DIAGNOSIS — Z8546 Personal history of malignant neoplasm of prostate: Secondary | ICD-10-CM | POA: Diagnosis not present

## 2018-06-07 DIAGNOSIS — R338 Other retention of urine: Secondary | ICD-10-CM | POA: Diagnosis not present

## 2018-06-13 DIAGNOSIS — R338 Other retention of urine: Secondary | ICD-10-CM | POA: Diagnosis not present

## 2018-06-21 DIAGNOSIS — R338 Other retention of urine: Secondary | ICD-10-CM | POA: Diagnosis not present

## 2018-06-21 DIAGNOSIS — C61 Malignant neoplasm of prostate: Secondary | ICD-10-CM | POA: Diagnosis not present

## 2018-06-25 ENCOUNTER — Telehealth: Payer: Self-pay | Admitting: Internal Medicine

## 2018-06-25 NOTE — Telephone Encounter (Signed)
New Message            [facility] Pre-operative Risk Assessment    Request for surgical clearance:  1. What type of surgery is being performed? Prostate  2. When is this surgery scheduled? Pending  3. What type of clearance is required (medical clearance vs. Pharmacy clearance to hold med vs. Both)? Both  4. Are there any medications that need to be held prior to surgery and how long?Eliquis  5. Practice name and name of physician performing surgery? Dr. Gloriann Loan   6. What is your office phone number [phone] Ext [phone]   7.   What is your office fax number [phone]  8.   Anesthesia type (None, local, MAC, general) ? General   Felecia F Skeen 06/25/2018, 12:16 PM  _________________________________________________________________   (provider comments below)

## 2018-06-27 ENCOUNTER — Ambulatory Visit (INDEPENDENT_AMBULATORY_CARE_PROVIDER_SITE_OTHER): Payer: Medicare Other | Admitting: *Deleted

## 2018-06-27 DIAGNOSIS — I495 Sick sinus syndrome: Secondary | ICD-10-CM

## 2018-06-27 DIAGNOSIS — R001 Bradycardia, unspecified: Secondary | ICD-10-CM | POA: Diagnosis not present

## 2018-06-27 NOTE — Progress Notes (Signed)
Remote pacemaker transmission.   

## 2018-06-28 NOTE — Telephone Encounter (Signed)
Pt takes Eliquis for afib with CHADS2VASc score of 4 (age x2, HTN, TIA). SCr 1.7, CrCl is 46 mL/min. Recommend minimizing time off of Eliquis as much as possible due to history of TIA. Would see if they are ok with pt holding warfarin for 24-36 hours prior to procedure and resume as soon as safely possible after.

## 2018-06-28 NOTE — Telephone Encounter (Signed)
   Primary Cardiologist: [name], MD  Chart reviewed as part of pre-operative protocol coverage. Patient was contacted 06/28/2018 in reference to pre-operative risk assessment for pending surgery as outlined below.  Ernest Parker was last seen on 04/10/18 by Dr. Lovena Le.  Since that day, Ernest Parker has done well w/o any cardiac symptoms. He denies CP and dyspnea.   Therefore, based on ACC/AHA guidelines, the patient would be at acceptable risk for the planned procedure without further cardiovascular testing.   Per pharmacy, it is ok to hold Eliquis but recommend minimizing time off of Eliquis as much as possible due to history of TIA. Would see if they are ok with pt holding warfarin for 24-36 hours prior to procedure and resume as soon as safely possible after.  I will route this recommendation to the requesting party via Epic fax function and remove from pre-op pool.  Please call with questions.  Lyda Jester, PA-C 06/28/2018, 2:51 PM

## 2018-07-01 ENCOUNTER — Emergency Department (HOSPITAL_COMMUNITY): Payer: Medicare Other

## 2018-07-01 ENCOUNTER — Encounter (HOSPITAL_COMMUNITY): Payer: Self-pay | Admitting: Emergency Medicine

## 2018-07-01 ENCOUNTER — Inpatient Hospital Stay (HOSPITAL_COMMUNITY)
Admission: EM | Admit: 2018-07-01 | Discharge: 2018-07-11 | DRG: 280 | Disposition: A | Discharge status: Home / Self Care | Payer: Medicare Other | Attending: Internal Medicine | Admitting: Internal Medicine

## 2018-07-01 ENCOUNTER — Observation Stay (HOSPITAL_COMMUNITY): Payer: Medicare Other

## 2018-07-01 ENCOUNTER — Other Ambulatory Visit: Payer: Self-pay

## 2018-07-01 DIAGNOSIS — R188 Other ascites: Secondary | ICD-10-CM | POA: Diagnosis present

## 2018-07-01 DIAGNOSIS — N390 Urinary tract infection, site not specified: Secondary | ICD-10-CM | POA: Diagnosis present

## 2018-07-01 DIAGNOSIS — K7689 Other specified diseases of liver: Secondary | ICD-10-CM | POA: Diagnosis not present

## 2018-07-01 DIAGNOSIS — R55 Syncope and collapse: Secondary | ICD-10-CM | POA: Diagnosis present

## 2018-07-01 DIAGNOSIS — D62 Acute posthemorrhagic anemia: Secondary | ICD-10-CM | POA: Diagnosis present

## 2018-07-01 DIAGNOSIS — Y846 Urinary catheterization as the cause of abnormal reaction of the patient, or of later complication, without mention of misadventure at the time of the procedure: Secondary | ICD-10-CM | POA: Diagnosis present

## 2018-07-01 DIAGNOSIS — Z841 Family history of disorders of kidney and ureter: Secondary | ICD-10-CM

## 2018-07-01 DIAGNOSIS — N183 Chronic kidney disease, stage 3 unspecified: Secondary | ICD-10-CM | POA: Diagnosis present

## 2018-07-01 DIAGNOSIS — R945 Abnormal results of liver function studies: Secondary | ICD-10-CM | POA: Diagnosis present

## 2018-07-01 DIAGNOSIS — Z8249 Family history of ischemic heart disease and other diseases of the circulatory system: Secondary | ICD-10-CM

## 2018-07-01 DIAGNOSIS — K449 Diaphragmatic hernia without obstruction or gangrene: Secondary | ICD-10-CM | POA: Diagnosis present

## 2018-07-01 DIAGNOSIS — B952 Enterococcus as the cause of diseases classified elsewhere: Secondary | ICD-10-CM | POA: Diagnosis present

## 2018-07-01 DIAGNOSIS — Z8546 Personal history of malignant neoplasm of prostate: Secondary | ICD-10-CM

## 2018-07-01 DIAGNOSIS — R7881 Bacteremia: Secondary | ICD-10-CM

## 2018-07-01 DIAGNOSIS — I21A1 Myocardial infarction type 2: Secondary | ICD-10-CM | POA: Diagnosis present

## 2018-07-01 DIAGNOSIS — S0990XA Unspecified injury of head, initial encounter: Secondary | ICD-10-CM | POA: Diagnosis not present

## 2018-07-01 DIAGNOSIS — I959 Hypotension, unspecified: Secondary | ICD-10-CM | POA: Diagnosis not present

## 2018-07-01 DIAGNOSIS — K226 Gastro-esophageal laceration-hemorrhage syndrome: Secondary | ICD-10-CM

## 2018-07-01 DIAGNOSIS — R51 Headache: Secondary | ICD-10-CM | POA: Diagnosis not present

## 2018-07-01 DIAGNOSIS — I10 Essential (primary) hypertension: Secondary | ICD-10-CM | POA: Diagnosis present

## 2018-07-01 DIAGNOSIS — I4891 Unspecified atrial fibrillation: Secondary | ICD-10-CM | POA: Diagnosis not present

## 2018-07-01 DIAGNOSIS — E860 Dehydration: Secondary | ICD-10-CM | POA: Diagnosis present

## 2018-07-01 DIAGNOSIS — Z87891 Personal history of nicotine dependence: Secondary | ICD-10-CM

## 2018-07-01 DIAGNOSIS — N179 Acute kidney failure, unspecified: Secondary | ICD-10-CM | POA: Diagnosis not present

## 2018-07-01 DIAGNOSIS — H9190 Unspecified hearing loss, unspecified ear: Secondary | ICD-10-CM | POA: Diagnosis present

## 2018-07-01 DIAGNOSIS — K7460 Unspecified cirrhosis of liver: Secondary | ICD-10-CM

## 2018-07-01 DIAGNOSIS — T83518A Infection and inflammatory reaction due to other urinary catheter, initial encounter: Secondary | ICD-10-CM | POA: Diagnosis not present

## 2018-07-01 DIAGNOSIS — N1832 Chronic kidney disease, stage 3b: Secondary | ICD-10-CM | POA: Diagnosis present

## 2018-07-01 DIAGNOSIS — Z66 Do not resuscitate: Secondary | ICD-10-CM | POA: Diagnosis present

## 2018-07-01 DIAGNOSIS — K810 Acute cholecystitis: Secondary | ICD-10-CM

## 2018-07-01 DIAGNOSIS — I129 Hypertensive chronic kidney disease with stage 1 through stage 4 chronic kidney disease, or unspecified chronic kidney disease: Secondary | ICD-10-CM | POA: Diagnosis present

## 2018-07-01 DIAGNOSIS — J9811 Atelectasis: Secondary | ICD-10-CM | POA: Diagnosis not present

## 2018-07-01 DIAGNOSIS — K319 Disease of stomach and duodenum, unspecified: Secondary | ICD-10-CM | POA: Diagnosis present

## 2018-07-01 DIAGNOSIS — E785 Hyperlipidemia, unspecified: Secondary | ICD-10-CM | POA: Diagnosis present

## 2018-07-01 DIAGNOSIS — I951 Orthostatic hypotension: Principal | ICD-10-CM | POA: Diagnosis present

## 2018-07-01 DIAGNOSIS — R778 Other specified abnormalities of plasma proteins: Secondary | ICD-10-CM

## 2018-07-01 DIAGNOSIS — Z6832 Body mass index (BMI) 32.0-32.9, adult: Secondary | ICD-10-CM

## 2018-07-01 DIAGNOSIS — Z7901 Long term (current) use of anticoagulants: Secondary | ICD-10-CM

## 2018-07-01 DIAGNOSIS — R0902 Hypoxemia: Secondary | ICD-10-CM | POA: Diagnosis not present

## 2018-07-01 DIAGNOSIS — B961 Klebsiella pneumoniae [K. pneumoniae] as the cause of diseases classified elsewhere: Secondary | ICD-10-CM | POA: Diagnosis present

## 2018-07-01 DIAGNOSIS — K2210 Ulcer of esophagus without bleeding: Secondary | ICD-10-CM | POA: Diagnosis present

## 2018-07-01 DIAGNOSIS — Z8673 Personal history of transient ischemic attack (TIA), and cerebral infarction without residual deficits: Secondary | ICD-10-CM

## 2018-07-01 DIAGNOSIS — R7989 Other specified abnormal findings of blood chemistry: Secondary | ICD-10-CM | POA: Diagnosis present

## 2018-07-01 DIAGNOSIS — I495 Sick sinus syndrome: Secondary | ICD-10-CM | POA: Diagnosis present

## 2018-07-01 DIAGNOSIS — Z880 Allergy status to penicillin: Secondary | ICD-10-CM

## 2018-07-01 DIAGNOSIS — E440 Moderate protein-calorie malnutrition: Secondary | ICD-10-CM | POA: Diagnosis present

## 2018-07-01 DIAGNOSIS — I4892 Unspecified atrial flutter: Secondary | ICD-10-CM | POA: Diagnosis not present

## 2018-07-01 DIAGNOSIS — I4821 Permanent atrial fibrillation: Secondary | ICD-10-CM | POA: Diagnosis present

## 2018-07-01 DIAGNOSIS — I480 Paroxysmal atrial fibrillation: Secondary | ICD-10-CM | POA: Diagnosis present

## 2018-07-01 DIAGNOSIS — K819 Cholecystitis, unspecified: Secondary | ICD-10-CM

## 2018-07-01 DIAGNOSIS — Z79899 Other long term (current) drug therapy: Secondary | ICD-10-CM

## 2018-07-01 DIAGNOSIS — K8020 Calculus of gallbladder without cholecystitis without obstruction: Secondary | ICD-10-CM | POA: Diagnosis not present

## 2018-07-01 DIAGNOSIS — D696 Thrombocytopenia, unspecified: Secondary | ICD-10-CM | POA: Diagnosis present

## 2018-07-01 DIAGNOSIS — R110 Nausea: Secondary | ICD-10-CM | POA: Diagnosis not present

## 2018-07-01 DIAGNOSIS — K8062 Calculus of gallbladder and bile duct with acute cholecystitis without obstruction: Secondary | ICD-10-CM | POA: Diagnosis present

## 2018-07-01 DIAGNOSIS — K2980 Duodenitis without bleeding: Secondary | ICD-10-CM | POA: Diagnosis present

## 2018-07-01 DIAGNOSIS — B9620 Unspecified Escherichia coli [E. coli] as the cause of diseases classified elsewhere: Secondary | ICD-10-CM | POA: Diagnosis present

## 2018-07-01 DIAGNOSIS — R112 Nausea with vomiting, unspecified: Secondary | ICD-10-CM

## 2018-07-01 DIAGNOSIS — Z923 Personal history of irradiation: Secondary | ICD-10-CM

## 2018-07-01 DIAGNOSIS — K8050 Calculus of bile duct without cholangitis or cholecystitis without obstruction: Secondary | ICD-10-CM

## 2018-07-01 DIAGNOSIS — R17 Unspecified jaundice: Secondary | ICD-10-CM

## 2018-07-01 DIAGNOSIS — K219 Gastro-esophageal reflux disease without esophagitis: Secondary | ICD-10-CM | POA: Diagnosis present

## 2018-07-01 DIAGNOSIS — E669 Obesity, unspecified: Secondary | ICD-10-CM | POA: Diagnosis present

## 2018-07-01 DIAGNOSIS — I2510 Atherosclerotic heart disease of native coronary artery without angina pectoris: Secondary | ICD-10-CM | POA: Diagnosis present

## 2018-07-01 DIAGNOSIS — Z974 Presence of external hearing-aid: Secondary | ICD-10-CM

## 2018-07-01 DIAGNOSIS — Z8679 Personal history of other diseases of the circulatory system: Secondary | ICD-10-CM

## 2018-07-01 DIAGNOSIS — Z950 Presence of cardiac pacemaker: Secondary | ICD-10-CM

## 2018-07-01 DIAGNOSIS — K5710 Diverticulosis of small intestine without perforation or abscess without bleeding: Secondary | ICD-10-CM | POA: Diagnosis present

## 2018-07-01 LAB — AMMONIA
AMMONIA: 59 umol/L — AB (ref 9–35)
AMMONIA: 41 umol/L — AB (ref 9–35)

## 2018-07-01 LAB — BASIC METABOLIC PANEL
ANION GAP: 11 (ref 5–15)
BUN: 28 mg/dL — ABNORMAL HIGH (ref 8–23)
CALCIUM: 8.3 mg/dL — AB (ref 8.9–10.3)
CO2: 20 mmol/L — AB (ref 22–32)
Chloride: 105 mmol/L (ref 98–111)
Creatinine, Ser: 2.12 mg/dL — ABNORMAL HIGH (ref 0.61–1.24)
GFR calc non Af Amer: 27 mL/min — ABNORMAL LOW (ref >60)
GFR, EST AFRICAN AMERICAN: 32 mL/min — AB (ref >60)
Glucose, Bld: 186 mg/dL — ABNORMAL HIGH (ref 70–99)
POTASSIUM: 3.9 mmol/L (ref 3.5–5.1)
Sodium: 136 mmol/L (ref 135–145)

## 2018-07-01 LAB — TROPONIN I
Troponin I: 0.1 ng/mL (ref <0.03)
Troponin I: 0.89 ng/mL (ref <0.03)

## 2018-07-01 LAB — CBC
HCT: 37 % — ABNORMAL LOW (ref 39.0–52.0)
HEMOGLOBIN: 11.8 g/dL — AB (ref 13.0–17.0)
MCH: 28.9 pg (ref 26.0–34.0)
MCHC: 31.9 g/dL (ref 30.0–36.0)
MCV: 90.5 fL (ref 80.0–100.0)
NRBC: 0 % (ref 0.0–0.2)
PLATELETS: 182 10*3/uL (ref 150–400)
RBC: 4.09 MIL/uL — AB (ref 4.22–5.81)
RDW: 14.8 % (ref 11.5–15.5)
WBC: 14.5 10*3/uL — ABNORMAL HIGH (ref 4.0–10.5)

## 2018-07-01 LAB — COMPREHENSIVE METABOLIC PANEL
ALK PHOS: 273 U/L — AB (ref 38–126)
ALT: 537 U/L — AB (ref 0–44)
AST: 398 U/L — ABNORMAL HIGH (ref 15–41)
Albumin: 2.9 g/dL — ABNORMAL LOW (ref 3.5–5.0)
Anion gap: 12 (ref 5–15)
BILIRUBIN TOTAL: 5.1 mg/dL — AB (ref 0.3–1.2)
BUN: 25 mg/dL — ABNORMAL HIGH (ref 8–23)
CALCIUM: 8.4 mg/dL — AB (ref 8.9–10.3)
CO2: 19 mmol/L — ABNORMAL LOW (ref 22–32)
CREATININE: 2.12 mg/dL — AB (ref 0.61–1.24)
Chloride: 104 mmol/L (ref 98–111)
GFR, EST AFRICAN AMERICAN: 32 mL/min — AB (ref >60)
GFR, EST NON AFRICAN AMERICAN: 27 mL/min — AB (ref >60)
Glucose, Bld: 180 mg/dL — ABNORMAL HIGH (ref 70–99)
Potassium: 3.9 mmol/L (ref 3.5–5.1)
Sodium: 135 mmol/L (ref 135–145)
Total Protein: 6.5 g/dL (ref 6.5–8.1)

## 2018-07-01 LAB — URINALYSIS, ROUTINE W REFLEX MICROSCOPIC
BILIRUBIN URINE: NEGATIVE
GLUCOSE, UA: NEGATIVE mg/dL
KETONES UR: NEGATIVE mg/dL
Nitrite: NEGATIVE
PH: 5 (ref 5.0–8.0)
Protein, ur: NEGATIVE mg/dL
Specific Gravity, Urine: 1.016 (ref 1.005–1.030)

## 2018-07-01 LAB — GLUCOSE, CAPILLARY
GLUCOSE-CAPILLARY: 156 mg/dL — AB (ref 70–99)
Glucose-Capillary: 122 mg/dL — ABNORMAL HIGH (ref 70–99)

## 2018-07-01 LAB — BILIRUBIN, DIRECT: BILIRUBIN DIRECT: 3.7 mg/dL — AB (ref 0.0–0.2)

## 2018-07-01 LAB — HEPATIC FUNCTION PANEL
ALBUMIN: 2.9 g/dL — AB (ref 3.5–5.0)
ALT: 453 U/L — AB (ref 0–44)
AST: 263 U/L — AB (ref 15–41)
Alkaline Phosphatase: 278 U/L — ABNORMAL HIGH (ref 38–126)
BILIRUBIN DIRECT: 3.9 mg/dL — AB (ref 0.0–0.2)
Indirect Bilirubin: 2.2 mg/dL — ABNORMAL HIGH (ref 0.3–0.9)
Total Bilirubin: 6.1 mg/dL — ABNORMAL HIGH (ref 0.3–1.2)
Total Protein: 6.6 g/dL (ref 6.5–8.1)

## 2018-07-01 LAB — PROTIME-INR
INR: 1.13
Prothrombin Time: 14.4 seconds (ref 11.4–15.2)

## 2018-07-01 LAB — TSH: TSH: 0.897 u[IU]/mL (ref 0.350–4.500)

## 2018-07-01 LAB — CBG MONITORING, ED: Glucose-Capillary: 185 mg/dL — ABNORMAL HIGH (ref 70–99)

## 2018-07-01 LAB — CK: Total CK: 196 U/L (ref 49–397)

## 2018-07-01 LAB — LACTATE DEHYDROGENASE: LDH: 263 U/L — AB (ref 98–192)

## 2018-07-01 MED ORDER — INSULIN ASPART 100 UNIT/ML ~~LOC~~ SOLN
0.0000 [IU] | Freq: Three times a day (TID) | SUBCUTANEOUS | Status: DC
  Administered 2018-07-01: 2 [IU] via SUBCUTANEOUS
  Administered 2018-07-03 – 2018-07-10 (×7): 1 [IU] via SUBCUTANEOUS

## 2018-07-01 MED ORDER — METOPROLOL SUCCINATE ER 100 MG PO TB24
100.0000 mg | ORAL_TABLET | Freq: Two times a day (BID) | ORAL | Status: DC
  Filled 2018-07-01 (×2): qty 1

## 2018-07-01 MED ORDER — ONDANSETRON HCL 4 MG PO TABS
4.0000 mg | ORAL_TABLET | Freq: Four times a day (QID) | ORAL | Status: DC | PRN
  Filled 2018-07-01: qty 1

## 2018-07-01 MED ORDER — LACTATED RINGERS IV SOLN
INTRAVENOUS | Status: DC
  Administered 2018-07-02 – 2018-07-03 (×2): via INTRAVENOUS

## 2018-07-01 MED ORDER — APIXABAN 2.5 MG PO TABS
2.5000 mg | ORAL_TABLET | Freq: Two times a day (BID) | ORAL | Status: DC
  Administered 2018-07-01 – 2018-07-02 (×3): 2.5 mg via ORAL
  Filled 2018-07-01 (×3): qty 1

## 2018-07-01 MED ORDER — SODIUM CHLORIDE 0.9 % IV BOLUS
500.0000 mL | Freq: Once | INTRAVENOUS | Status: AC
  Administered 2018-07-01: 500 mL via INTRAVENOUS

## 2018-07-01 MED ORDER — ONDANSETRON HCL 4 MG/2ML IJ SOLN
4.0000 mg | Freq: Four times a day (QID) | INTRAMUSCULAR | Status: DC | PRN
  Administered 2018-07-03: 4 mg via INTRAVENOUS
  Filled 2018-07-01: qty 2

## 2018-07-01 MED ORDER — TAMSULOSIN HCL 0.4 MG PO CAPS
0.4000 mg | ORAL_CAPSULE | Freq: Two times a day (BID) | ORAL | Status: DC
  Administered 2018-07-01 – 2018-07-11 (×20): 0.4 mg via ORAL
  Filled 2018-07-01 (×20): qty 1

## 2018-07-01 MED ORDER — DOCUSATE SODIUM 100 MG PO CAPS
100.0000 mg | ORAL_CAPSULE | Freq: Two times a day (BID) | ORAL | Status: DC
  Administered 2018-07-01 – 2018-07-10 (×10): 100 mg via ORAL
  Filled 2018-07-01 (×18): qty 1

## 2018-07-01 MED ORDER — MORPHINE SULFATE (PF) 2 MG/ML IV SOLN: 2.0000 mg | INTRAVENOUS | Status: DC | PRN

## 2018-07-01 MED ORDER — ACETAMINOPHEN 325 MG PO TABS
650.0000 mg | ORAL_TABLET | Freq: Four times a day (QID) | ORAL | Status: DC | PRN
  Administered 2018-07-10: 650 mg via ORAL
  Filled 2018-07-01: qty 2

## 2018-07-01 MED ORDER — DILTIAZEM HCL ER COATED BEADS 180 MG PO CP24
180.0000 mg | ORAL_CAPSULE | Freq: Every day | ORAL | Status: DC
  Administered 2018-07-01: 180 mg via ORAL
  Filled 2018-07-01: qty 1

## 2018-07-01 MED ORDER — SODIUM CHLORIDE 0.9% FLUSH
3.0000 mL | Freq: Two times a day (BID) | INTRAVENOUS | Status: DC
  Administered 2018-07-01 – 2018-07-11 (×11): 3 mL via INTRAVENOUS

## 2018-07-01 MED ORDER — ACETAMINOPHEN 650 MG RE SUPP: 650.0000 mg | Freq: Four times a day (QID) | RECTAL | Status: DC | PRN

## 2018-07-01 NOTE — ED Notes (Signed)
Paged [place] about pacemaker interrogation. Waiting for return call.

## 2018-07-01 NOTE — ED Notes (Signed)
Report called to Hi-Nella, RN

## 2018-07-01 NOTE — Consult Note (Addendum)
Referring Provider: [facility]  Primary Care Physician:  Seward Carol, MD Primary Gastroenterologist:   unassigned  Reason for Consultation:   Abnormal liver tests    ASSESSMENT AND PLAN:    62. 84 yo male with asymptomatic abnormal liver labs, mixed pattern found after what sounds like a syncopal episode at home early this am.  Liver tests previously normal.  -Wonder about ischemic hepatitis though biliary enzymes / bilirubin higher than expected.  -I think meds are ruled out.  -He has cholelithiasis but absence of abdominal pain / imaging study results biliary obstruction seem unlikely -INR , repeat liver tests including fractionated bili today.  -If number improve then continue to monitor. If they don't, especially bilirubin then consider MRCP   2. AFIB, on Eliquis. See below for additional medical history.  HPI: Ernest Parker is a 84 y.o. male with history of prostate cancer, chronic foley, A.fib on Eliquis, TIA, tachy-brady syndrome s/p pacemaker, AAA s/p stent.  Patient brought to the ED today after a possible syncopal episode at home this am.  Daughter heard a loud sound, found patient lying on his back on the floor around 430 this morning.  His eyes were open but response was minimal for a while.  Patient does not recall falling.  He was apparently trying to go to the bathroom at the time.  He played golf on Saturday but yesterday mentioned to family that he did not feel so well.  He was unable to give any details but denies any recent abdominal pain.  No nausea or vomiting.  His appetite is been fine.  Patient has not taken any new medications recently.  No vitamins or herbs.  ED his liver tests were markedly abnormal Alk phos 273 AST 398 ALT 537 Total bilirubin 5.1 Ammonia 59 Trop 0.1 Ultrasound suggest sludge and stones in the gallbladder but no evidence for cholecystitis.  Common bile duct nondilated Head CT scan done for fall negative for acute abnormalities BP  98/62    Past Medical History:  Diagnosis Date  . A-fib ([place])   . Dizzy   . Dyspnea   . GERD (gastroesophageal reflux disease)   . Hard of hearing    wears hearing aids bilat  . High cholesterol   . Hypertension   . Presence of permanent cardiac pacemaker 12/17/2017  . Prostate cancer ([place]) 2002   S/P "radiation for 8-9 weeks"  . SOB (shortness of breath)     Past Surgical History:  Procedure Laterality Date  . INGUINAL HERNIA REPAIR Bilateral   . INSERT / REPLACE / REMOVE PACEMAKER  12/17/2017  . PACEMAKER IMPLANT N/A 12/17/2017   Procedure: PACEMAKER IMPLANT;  Surgeon: Evans Lance, MD;  Location: [facility];  Service: Cardiovascular;  Laterality: N/A;  . PROSTATE BIOPSY    . THORACIC AORTIC ANEURYSM REPAIR      Prior to Admission medications   Medication Sig Start Date End Date Taking? Authorizing Provider  apixaban (ELIQUIS) 2.5 MG TABS tablet Take 1 tablet (2.5 mg total) by mouth 2 (two) times daily. 10/23/17  Yes Evans Lance, MD  Cholecalciferol (VITAMIN D3) 2000 units TABS Take 2,000 Units by mouth daily.    Yes [provider]  diltiazem (CARDIZEM CD) 180 MG 24 hr capsule Take 1 capsule (180 mg total) by mouth daily. 04/30/18  Yes Evans Lance, MD  furosemide (LASIX) 40 MG tablet TAKE 1 TABLET BY MOUTH EVERY DAY Patient taking differently: Take 40 mg by  mouth daily.  05/27/18  Yes Evans Lance, MD  metoprolol succinate (TOPROL-XL) 100 MG 24 hr tablet TAKE 1 TABLET TWICE DAILY IMMEDIATELY FOLLOWING MEAL Patient taking differently: Take 100 mg by mouth 2 (two) times daily.  05/07/18  Yes Evans Lance, MD  Red Yeast Rice 600 MG CAPS Take 600 mg by mouth daily.    Yes [provider]  tamsulosin (FLOMAX) 0.4 MG CAPS capsule Take 1 capsule (0.4 mg total) by mouth daily after supper. Patient taking differently: Take 0.4 mg by mouth 2 (two) times daily.  03/15/18  Yes Evans Lance, MD    Current Facility-Administered Medications    Medication Dose Route Frequency Provider Last Rate Last Dose  . acetaminophen (TYLENOL) tablet 650 mg  650 mg Oral Q6H PRN Karmen Bongo, MD       Or  . acetaminophen (TYLENOL) suppository 650 mg  650 mg Rectal Q6H PRN Karmen Bongo, MD      . apixaban Arne Cleveland) tablet 2.5 mg  2.5 mg Oral BID Karmen Bongo, MD      . diltiazem (CARDIZEM CD) 24 hr capsule 180 mg  180 mg Oral Daily Karmen Bongo, MD      . docusate sodium (COLACE) capsule 100 mg  100 mg Oral BID Karmen Bongo, MD      . insulin aspart (novoLOG) injection 0-9 Units  0-9 Units Subcutaneous TID WC Karmen Bongo, MD      . lactated ringers infusion   Intravenous Continuous Karmen Bongo, MD      . metoprolol succinate (TOPROL-XL) 24 hr tablet 100 mg  100 mg Oral BID Karmen Bongo, MD      . morphine 2 MG/ML injection 2 mg  2 mg Intravenous Q2H PRN Karmen Bongo, MD      . ondansetron [facility]) tablet 4 mg  4 mg Oral Q6H PRN Karmen Bongo, MD       Or  . ondansetron [facility]) injection 4 mg  4 mg Intravenous Q6H PRN Karmen Bongo, MD      . sodium chloride flush (NS) 0.9 % injection 3 mL  3 mL Intravenous Lillia Mountain, MD      . tamsulosin [facility]) capsule 0.4 mg  0.4 mg Oral BID Karmen Bongo, MD        Allergies as of 07/01/2018 - Review Complete 07/01/2018  Allergen Reaction Noted  . Penicillins Other (See Comments) 10/02/2017    Family History  Problem Relation Age of Onset  . Hypertension Mother   . Tuberculosis Father   . Kidney disease Sister   . Sleep apnea Sister     Social History   Socioeconomic History  . Marital status: Widowed    Spouse name: Not on file  . Number of children: Not on file  . Years of education: Not on file  . Highest education level: Not on file  Occupational History  . Occupation: RETIRED  Social Needs  . Financial resource strain: Not on file  . Food insecurity:    Worry: Not on file    Inability: Not on file  . Transportation needs:    Medical:  Not on file    Non-medical: Not on file  Tobacco Use  . Smoking status: Former Smoker    Packs/day: 0.50    Years: 30.00    Pack years: 15.00    Types: Cigarettes    Last attempt to quit: 1980    Years since quitting: 39.8  . Smokeless tobacco: Never Used  Substance and Sexual Activity  . Alcohol use: Yes    Alcohol/week: 1.0 standard drinks    Types: 1 Cans of beer per week    Comment: rare  . Drug use: No  . Sexual activity: Not on file  Lifestyle  . Physical activity:    Days per week: Not on file    Minutes per session: Not on file  . Stress: Not on file  Relationships  . Social connections:    Talks on phone: Not on file    Gets together: Not on file    Attends religious service: Not on file    Active member of club or organization: Not on file    Attends meetings of clubs or organizations: Not on file    Relationship status: Not on file  . Intimate partner violence:    Fear of current or ex partner: Not on file    Emotionally abused: Not on file    Physically abused: Not on file    Forced sexual activity: Not on file  Other Topics Concern  . 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 Lives alone during the summer in [place] and lives with daughter during the Winter in [place], moving to [place]. Has 1 daughter and 2 sons. One sone lives in [place] and one in [place].   Is an avid golfer, plays 4-5 days per week. Likes to do yardwork.     Review of Systems: All systems reviewed and negative except where noted in HPI.  Physical Exam: Vital signs in last 24 hours: Temp:  [98 F (36.7 C)-98.1 F (36.7 C)] 98 F (36.7 C) (10/21 1358) Pulse Rate:  [58-99] 70 (10/21 1358) Resp:  [9-27] 16 (10/21 1358) BP: (91-129)/(55-72) 129/72 (10/21 1358) SpO2:  [89 %-99 %] 97 % (10/21 1358) Weight:  [96 kg-97.2 kg] 97.2 kg (10/21 1358)   General:   Alert, well-developed Psych:  Pleasant, cooperative.  Normal mood and affect. Eyes:  Pupils equal, sclera clear, no icterus.   Conjunctiva pink. Ears:  Normal auditory acuity. Nose:  No deformity, discharge,  or lesions. Neck:  Supple; no masses Lungs:  Clear throughout to auscultation.   No wheezes, crackles, or rhonchi.  Heart:  Regular rate and rhythm, no edema Abdomen:  Soft, non-distended, nontender, BS active, no palp mass    Rectal:  Deferred  Msk:  Symmetrical without gross deformities. . Neurologic:  Alert and  oriented x4;  grossly normal neurologically. Skin:  Intact without significant lesions or rashes..   Intake/Output from previous day: 10/20 0701 - 10/21 0700 In: 100 [I.V.:100] Out: -  Intake/Output this shift: Total I/O In: -  Out: 100 [Urine:100]  Lab Results: Recent Labs    07/01/18 0552  WBC 14.5*  HGB 11.8*  HCT 37.0*  PLT 182   BMET Recent Labs    07/01/18 0552 07/01/18 0710  NA 136 135  K 3.9 3.9  CL 105 104  CO2 20* 19*  GLUCOSE 186* 180*  BUN 28* 25*  CREATININE 2.12* 2.12*  CALCIUM 8.3* 8.4*   LFT Recent Labs    07/01/18 0710  PROT 6.5  ALBUMIN 2.9*  AST 398*  ALT 537*  ALKPHOS 273*  BILITOT 5.1*    Studies/Results: Dg Chest 2 View  Result Date: 07/01/2018 CLINICAL DATA:  Shortness of breath EXAM: CHEST - 2 VIEW COMPARISON:  December 18, 2017 FINDINGS: There Is no appreciable edema  or consolidation. There is mild atelectatic change in the left base. There is no consolidation. Heart is mildly enlarged with pulmonary vascularity normal. Pacemaker leads are attached to the right atrium and right ventricle. No evident adenopathy. There is degenerative change in the thoracic spine. There is a stent in the abdominal aortic region, incompletely visualized. There is aortic atherosclerosis. IMPRESSION: Mild left base atelectasis. No edema or consolidation. Heart mildly enlarged with pacemaker leads as described. There is aortic atherosclerosis. Aortic Atherosclerosis (ICD10-I70.0).  Electronically Signed   By: Lowella Grip III M.D.   On: 07/01/2018 07:21   Ct Head Wo Contrast  Result Date: 07/01/2018 CLINICAL DATA:  Recent fall with left-sided headaches, initial encounter EXAM: CT HEAD WITHOUT CONTRAST TECHNIQUE: Contiguous axial images were obtained from the base of the skull through the vertex without intravenous contrast. COMPARISON:  10/15/2017 FINDINGS: Brain: Lacunar infarct is again noted in the left basal ganglia. No findings to suggest acute hemorrhage, acute infarction or space-occupying mass lesion are noted. Mild atrophic changes are noted and stable. Vascular: No hyperdense vessel or unexpected calcification. Skull: Normal. Negative for fracture or focal lesion. Sinuses/Orbits: No acute finding. Other: None. 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 No acute intracranial abnormality is noted. Electronically Signed   By: Inez Catalina M.D.   On: 07/01/2018 08:23   US Abdomen Complete  Result Date: 07/01/2018 CLINICAL DATA:  Elevated LFTs EXAM: COMPLETE ABDOMINAL ULTRASOUND COMPARISON:  None available FINDINGS: Gallbladder: Intraluminal sludge and multiple calculi measuring up to 1.1 cm in the gallbladder. Normal wall thickness 2.4 mm. No pericholecystic fluid. Sonographer describes no sonographic Murphy's sign. Common bile duct:  Normal in caliber for age, 7.61m diameter. Liver: Homogeneous in echotexture. Mildly complex 3.3 cm cyst in the left lobe. No intrahepatic biliary ductal dilatation. Antegrade color Doppler flow signal in the main portal vein. IVC:  Negative Pancreas: Visualized segments unremarkable, portions obscured by overlying bowel gas. Spleen:  No focal lesion, craniocaudal 7.1cm in length. Right Kidney:  No mass or hydronephrosis, 12.2cm in length. Left Kidney:  No lesion or hydronephrosis, 12.2cm in length. Abdominal aorta: Native aneurysm sac 9.7 cm transverse diameter, with apparent intraluminal stent graft partially seen. IMPRESSION: 1.  Cholelithiasis without other ultrasound evidence of cholecystitis or biliary obstruction. 2. 3.3 cm complex hepatic cyst, left lobe. 3. 9.7 cm native abdominal aortic aneurysm diameter, with apparent intraluminal stent graft, incompletely evaluated. Electronically Signed   By: DLucrezia EuropeM.D.   On: 07/01/2018 12:59     PTye Savoy NP-C @  07/01/2018, 2:19 PM

## 2018-07-01 NOTE — ED Triage Notes (Signed)
Pt arrives to ED from home with complaints of a unwitnessed fall/syncopal episode. EMS reports pt is from home and was in a different room than his family. His daughter heard a thump on the ground. When family went to pt he was staring off and unsure of what happened. Pt has hx of AAA in June 2019 and has had urinary catheter placed since then. Pt has had two UTI's since June., Pt last round of antibiotics for UTI finished 2 weeks ago. Pt has hx of Afib w/ RVR and is on blood thinner. Pt has no complaints of pain. Pt placed in position of comfort with bed locked and lowered, call bell in reach.

## 2018-07-01 NOTE — ED Notes (Signed)
Return call received from [place]. Rep walking me through second attempt at interrogation.

## 2018-07-01 NOTE — ED Notes (Signed)
Pt has St. Jude pacemaker. 

## 2018-07-01 NOTE — ED Notes (Signed)
Family at bedside. 

## 2018-07-01 NOTE — Plan of Care (Signed)
  Problem: Activity: Goal: Risk for activity intolerance will decrease Outcome: Progressing   Problem: Pain Managment: Goal: General experience of comfort will improve Outcome: Progressing   Problem: Safety: Goal: Ability to remain free from injury will improve Outcome: Progressing   

## 2018-07-01 NOTE — ED Notes (Signed)
ED Provider at bedside. 

## 2018-07-01 NOTE — H&P (Signed)
History and Physical    Ernest Parker OAC:[id number] DOB: [birth year] DOA: 07/01/2018  PCP: Ernest Carol, MD Consultants:  Ernest Parker - cardiology; Ernest Parker - urology Patient coming from:  Home - lives with daughter and son-in-law; Ernest Parker: Daughter, [phone]  Chief Complaint: LOC  HPI: Ernest Parker is a 84 y.o. male with medical history significant of remote prostate CA; pacemaker placement; HTN; HLD; and afib on Eliquis presenting with a period of unresponsiveness.  "I don't know, I ended up here."  About 424AM, his daughter and SIL heard a moan and thud.  They found him awake and breathing but he was unresponsive.  It was 25-30 minutes before he became coherent.  Despite foley, he awakes about twice a night to get up and empty his foley bag.  He remembers doing this about 1AM and then going back to bed. Presumably, the second time he got up overnight was when he lost consciousness.  The next thing he remembers is being on the stretcher going out to the ambulance.  No h/o similar.  Yesterday he didn't feel well - chilly, tired, not much of an appetite.  He did play golf Saturday and felt well.    Has a chronic indwelling foley; due for "prostate ablation" soon (laser TURP?).  The foley has been there intermittently since June.  In January, he went into afib and ended up with a pacemaker,  Since then, he has had SOB with significant exertion.  He has been escalating his golf game recently due to improved foley comfort.    ED Course:  Patient was in bed and family heard a thump.  He was on the floor - awake but unresponsive for about 20 minutes.  CT negative for bleed.  Troponin 0.10.  [facility] has interrogated pacer but not reading.  AKI but elevated LFTs.  Abdominal US pending.  Review of Systems: As per HPI; otherwise review of systems reviewed and negative.   Ambulatory Status:  Ambulates without assistance  Past Medical History:  Diagnosis Date  . A-fib ([place])   . Dizzy   . Dyspnea   . GERD  (gastroesophageal reflux disease)   . Hard of hearing    wears hearing aids bilat  . High cholesterol   . Hypertension   . Presence of permanent cardiac pacemaker 12/17/2017  . Prostate cancer ([place]) 2002   S/P "radiation for 8-9 weeks"  . SOB (shortness of breath)     Past Surgical History:  Procedure Laterality Date  . INGUINAL HERNIA REPAIR Bilateral   . INSERT / REPLACE / REMOVE PACEMAKER  12/17/2017  . PACEMAKER IMPLANT N/A 12/17/2017   Procedure: PACEMAKER IMPLANT;  Surgeon: Evans Lance, MD;  Location: [facility];  Service: Cardiovascular;  Laterality: N/A;  . PROSTATE BIOPSY    . THORACIC AORTIC ANEURYSM REPAIR      Social History   Socioeconomic History  . Marital status: Widowed    Spouse name: Not on file  . Number of children: Not on file  . Years of education: Not on file  . Highest education level: Not on file  Occupational History  . Occupation: RETIRED  Social Needs  . Financial resource strain: Not on file  . Food insecurity:    Worry: Not on file    Inability: Not on file  . Transportation needs:    Medical: Not on file    Non-medical: Not on file  Tobacco Use  . Smoking status: Former Smoker  Packs/day: 0.50    Years: 30.00    Pack years: 15.00    Types: Cigarettes    Last attempt to quit: 1980    Years since quitting: 39.8  . Smokeless tobacco: Never Used  Substance and Sexual Activity  . Alcohol use: Yes    Alcohol/week: 1.0 standard drinks    Types: 1 Cans of beer per week    Comment: rare  . Drug use: No  . Sexual activity: Not on file  Lifestyle  . Physical activity:    Days per week: Not on file    Minutes per session: Not on file  . Stress: Not on file  Relationships  . Social connections:    Talks on phone: Not on file    Gets together: Not on file    Attends religious service: Not on file    Active member of club or organization: Not on file    Attends meetings of clubs or organizations: Not on file     Relationship status: Not on file  . Intimate partner violence:    Fear of current or ex partner: Not on file    Emotionally abused: Not on file    Physically abused: Not on file    Forced sexual activity: Not on file  Other Topics Concern  . 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 Lives alone during the summer in [place] and lives with daughter during the Winter in [place], moving to [place]. Has 1 daughter and 2 sons. One sone lives in [place] and one in [place].   Is an avid golfer, plays 4-5 days per week. Likes to do yardwork.     Allergies  Allergen Reactions  . Penicillins Other (See Comments)    Patient can't remember reaction was told by mother was allergic as a child Has patient had a PCN reaction causing immediate rash, facial/tongue/throat swelling, SOB or lightheadedness with hypotension: Unknown Has patient had a PCN reaction causing severe rash involving mucus membranes or skin necrosis: Unknown Has patient had a PCN reaction that required hospitalization: Unknown Has patient had a PCN reaction occurring within the last 10 years: No If all of the above answers are "NO", then may pro    Family History  Problem Relation Age of Onset  . Hypertension Mother   . Tuberculosis Father   . Kidney disease Sister   . Sleep apnea Sister     Prior to Admission medications   Medication Sig Start Date End Date Taking? Authorizing Provider  apixaban (ELIQUIS) 2.5 MG TABS tablet Take 1 tablet (2.5 mg total) by mouth 2 (two) times daily. 10/23/17  Yes Evans Lance, MD  Cholecalciferol (VITAMIN D3) 2000 units TABS Take 2,000 Units by mouth daily.    Yes [provider]  diltiazem (CARDIZEM CD) 180 MG 24 hr capsule Take 1 capsule (180 mg total) by mouth daily. 04/30/18  Yes Evans Lance, MD  furosemide (LASIX) 40 MG tablet TAKE 1 TABLET BY MOUTH EVERY DAY Patient taking differently: Take 40 mg by  mouth daily.  05/27/18  Yes Evans Lance, MD  metoprolol succinate (TOPROL-XL) 100 MG 24 hr tablet TAKE 1 TABLET TWICE DAILY IMMEDIATELY FOLLOWING MEAL Patient taking differently: Take 100 mg by mouth 2 (two) times daily.  05/07/18  Yes Evans Lance, MD  Red Yeast Rice 600 MG CAPS Take 600 mg by mouth daily.  Yes [provider]  tamsulosin (FLOMAX) 0.4 MG CAPS capsule Take 1 capsule (0.4 mg total) by mouth daily after supper. Patient taking differently: Take 0.4 mg by mouth 2 (two) times daily.  03/15/18  Yes Evans Lance, MD    Physical Exam: Vitals:   07/01/18 0800 07/01/18 0900 07/01/18 0915 07/01/18 1030  BP: 91/64 (!) 109/57 101/60 106/63  Pulse: 66 63 61 (!) 58  Resp: 13 (!) 27 (!) 9 20  Temp:      TempSrc:      SpO2: 97% 97% 96% (!) 89%  Weight:      Height:         General:  Appears calm and comfortable and is NAD Eyes:  PERRL, EOMI, normal lids, iris ENT:  grossly normal hearing, lips & tongue, mmm; artificial dentition Neck:  no LAD, masses or thyromegaly; no carotid bruits Cardiovascular:  RRR, no m/r/g. No Parker edema.  Respiratory:   CTA bilaterally with no wheezes/rales/rhonchi.  Normal respiratory effort. Abdomen:  soft, NT, ND, NABS Back:   normal alignment, no CVAT Skin:  no rash or induration seen on limited exam Musculoskeletal:  grossly normal tone BUE/BLE, good ROM, no bony abnormality Psychiatric:  grossly normal mood and affect, speech fluent and appropriate, AOx3 Neurologic:  CN 2-12 grossly intact, moves all extremities in coordinated fashion, sensation intact    Radiological Exams on Admission: Dg Chest 2 View  Result Date: 07/01/2018 CLINICAL DATA:  Shortness of breath EXAM: CHEST - 2 VIEW COMPARISON:  December 18, 2017 FINDINGS: There Is no appreciable edema or consolidation. There is mild atelectatic change in the left base. There is no consolidation. Heart is mildly enlarged with pulmonary vascularity normal. Pacemaker leads are  attached to the right atrium and right ventricle. No evident adenopathy. There is degenerative change in the thoracic spine. There is a stent in the abdominal aortic region, incompletely visualized. There is aortic atherosclerosis. IMPRESSION: Mild left base atelectasis. No edema or consolidation. Heart mildly enlarged with pacemaker leads as described. There is aortic atherosclerosis. Aortic Atherosclerosis (ICD10-I70.0). Electronically Signed   By: Lowella Grip III M.D.   On: 07/01/2018 07:21   Ct Head Wo Contrast  Result Date: 07/01/2018 CLINICAL DATA:  Recent fall with left-sided headaches, initial encounter EXAM: CT HEAD WITHOUT CONTRAST TECHNIQUE: Contiguous axial images were obtained from the base of the skull through the vertex without intravenous contrast. COMPARISON:  10/15/2017 FINDINGS: Brain: Lacunar infarct is again noted in the left basal ganglia. No findings to suggest acute hemorrhage, acute infarction or space-occupying mass lesion are noted. Mild atrophic changes are noted and stable. Vascular: No hyperdense vessel or unexpected calcification. Skull: Normal. Negative for fracture or focal lesion. Sinuses/Orbits: No acute finding. Other: None. 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 No acute intracranial abnormality is noted. Electronically Signed   By: Inez Catalina M.D.   On: 07/01/2018 08:23   US Abdomen Complete  Result Date: 07/01/2018 CLINICAL DATA:  Elevated LFTs EXAM: COMPLETE ABDOMINAL ULTRASOUND COMPARISON:  None available FINDINGS: Gallbladder: Intraluminal sludge and multiple calculi measuring up to 1.1 cm in the gallbladder. Normal wall thickness 2.4 mm. No pericholecystic fluid. Sonographer describes no sonographic Murphy's sign. Common bile duct:  Normal in caliber for age, 7.17mm diameter. Liver: Homogeneous in echotexture. Mildly complex 3.3 cm cyst in the left lobe. No intrahepatic biliary ductal dilatation. Antegrade color Doppler flow signal in the main  portal vein. IVC:  Negative Pancreas: Visualized segments unremarkable, portions obscured by overlying bowel  gas. Spleen:  No focal lesion, craniocaudal 7.1cm in length. Right Kidney:  No mass or hydronephrosis, 12.2cm in length. Left Kidney:  No lesion or hydronephrosis, 12.2cm in length. Abdominal aorta: Native aneurysm sac 9.7 cm transverse diameter, with apparent intraluminal stent graft partially seen. IMPRESSION: 1. Cholelithiasis without other ultrasound evidence of cholecystitis or biliary obstruction. 2. 3.3 cm complex hepatic cyst, left lobe. 3. 9.7 cm native abdominal aortic aneurysm diameter, with apparent intraluminal stent graft, incompletely evaluated. Electronically Signed   By: Lucrezia Europe M.D.   On: 07/01/2018 12:59    EKG: Independently reviewed.  Afib with rate 99; nonspecific ST changes with no evidence of acute ischemia   Labs on Admission: I have personally reviewed the available labs and imaging studies at the time of the admission.  Pertinent labs:   CO2 19 Glucose 180 BUN 25/Creatinine 2.12/GFR 27; 19/1.70/37 on 12/13/17 AP 273 Albumin 2.9 AST 398/ALT 537/Bili 5.1 - normal LFTs in 2/19 Troponin 0.10 UA: moderate Hgb, large Parker, rate bacteria WBC 14.5 Hgb 11.8; 13.1 on 12/13/17  Assessment/Plan Principal Problem:   Syncope and collapse Active Problems:   A-fib (HCC)   Hypertension   History of prostate cancer   Elevated LFTs   CKD (chronic kidney disease) stage 3, GFR 30-59 ml/min (HCC)   Syncope -Etiology is not clear. The differential diagnosis is broad, including vasovagal syncope, seizure, TIA/stroke, arrhythmia, ACS, alcohol intoxication, drug abuse, orthostatic status, carotid artery stenosis -By the [facility] syncope rule, the patient is at low risk for serious outcome -However, he had a very prolonged period to recovery and this is concerning -Will monitor on telemetry -Orthostatic vital signs now and in AM -Trending troponins x3 - his initial was  mildly elevated, but without chest pain there is lower suspicion for ACS.  This may be related to renal insufficiency or to demand ischemia.  Hold cardiology consult at this time. -His pacer has been interrogated without apparent concern. -2d echo -Neuro checks  -His elevated LFTs are a concern and obstructive jaundice could result in syncope -If no further episodes and GI signs off, he may be appropriate for d/c tomorrow  Elevated LFTs -Uncertain etiology or duration of this issue -Prior LFTs were in 2/19 and were normal -He does not appear to be taking medications that could cause this issue -Medical records from [facility] requested, as he had AAA surgery there in June -RUQ US obtained and shows cholelithiasis without apparent biliary dilation or obstruction -GI consult requested  -Repeat LFTs tomorrow  Prostate CA -Remote h/o -Currently with urinary retention thought to be due to BPH according to family -Has indwelling foley -Has upcoming appointment with urology for ?TURP -Continue Flomax  Afib on Eliquis -Rate controlled with pacemaker, on Cardizem and Toprol -Continue Eliquis  HTN -Continue Toprol and Eliquis  CKD -Renal function is slightly worse today than in 4/19 - possible AKI -However, he had AAA stenting in the interim -Will hydrate and hold Lasix -Will recheck in AM   DVT prophylaxis: Eliquis Code Status:  DNR - confirmed with patient/family Family Communication: Daughter and son-in-law were present throughout evaluation Disposition Plan:  Home once clinically improved Consults called: GI  Admission status: It is my clinical opinion that referral for OBSERVATION is reasonable and necessary in this patient based on the above information provided. The aforementioned taken together are felt to place the patient at high risk for further clinical deterioration. However it is anticipated that the patient may be medically stable for discharge from  the hospital within 24 to  48 hours.     Karmen Bongo MD [facility]  If note is complete, please contact covering daytime or nighttime physician. [email or web address] Password [email or web address]  07/01/2018, 1:16 PM

## 2018-07-01 NOTE — ED Provider Notes (Signed)
[facility] EMERGENCY DEPARTMENT Provider Note   CSN: [id number] Arrival date & time: 07/01/18  0535     History   Chief Complaint Chief Complaint  Patient presents with  . Loss of Consciousness    HPI Ernest Parker is a 84 y.o. male.  The history is provided by the patient. No language interpreter was used.  Loss of Consciousness   This is a new problem. The current episode started 3 to 5 hours ago. The problem occurs constantly. The problem has been resolved. He lost consciousness for a period of less than one minute. The problem is associated with normal activity. Pertinent negatives include chest pain, focal weakness, light-headedness and visual change. He has tried nothing for the symptoms.  Pt's family reports they were in bed and heard a noise.  They found pt in the floor.  Pt was unresponsive for about 20 minutes. Family reports he seemed awake but would not respond.  Unsure if pt his his head.  He is on blood thinners.  Pt has a pacemaker.  He has a history of afib.   Past Medical History:  Diagnosis Date  . A-fib ([place])   . Dizzy   . Dyspnea   . GERD (gastroesophageal reflux disease)   . Hard of hearing    wears hearing aids bilat  . High cholesterol   . Hypertension   . Presence of permanent cardiac pacemaker 12/17/2017  . Prostate cancer ([place]) 2002   S/P "radiation for 8-9 weeks"  . SOB (shortness of breath)     Patient Active Problem List   Diagnosis Date Noted  . Sinus node dysfunction ([place]) 12/17/2017  . TIA (transient ischemic attack) 11/12/2017  . History of prostate cancer 11/12/2017  . A-fib ([place])   . Hypertension     Past Surgical History:  Procedure Laterality Date  . INGUINAL HERNIA REPAIR Bilateral   . INSERT / REPLACE / REMOVE PACEMAKER  12/17/2017  . PACEMAKER IMPLANT N/A 12/17/2017   Procedure: PACEMAKER IMPLANT;  Surgeon: Evans Lance, MD;  Location: [facility];  Service: Cardiovascular;  Laterality: N/A;  .  PROSTATE BIOPSY          Home Medications    Prior to Admission medications   Medication Sig Start Date End Date Taking? Authorizing Provider  apixaban (ELIQUIS) 2.5 MG TABS tablet Take 1 tablet (2.5 mg total) by mouth 2 (two) times daily. 10/23/17   Evans Lance, MD  aspirin 81 MG tablet Take 1 tablet by mouth daily.    [provider]  Cholecalciferol (VITAMIN D3) 2000 units TABS Take 2,000 Units by mouth daily.     [provider]  diltiazem (CARDIZEM CD) 180 MG 24 hr capsule Take 1 capsule (180 mg total) by mouth daily. 04/30/18   Evans Lance, MD  furosemide (LASIX) 40 MG tablet TAKE 1 TABLET BY MOUTH EVERY DAY 05/27/18   Evans Lance, MD  metoprolol succinate (TOPROL-XL) 100 MG 24 hr tablet TAKE 1 TABLET TWICE DAILY IMMEDIATELY FOLLOWING MEAL 05/07/18   Evans Lance, MD  Red Yeast Rice 600 MG CAPS Take 600 mg by mouth daily.     [provider]  tamsulosin (FLOMAX) 0.4 MG CAPS capsule Take 1 capsule (0.4 mg total) by mouth daily after supper. 03/15/18   Evans Lance, MD    Family History Family History  Problem Relation Age of Onset  . Hypertension Mother   . Tuberculosis Father   .  Kidney disease Sister   . Sleep apnea Sister     Social History Social History   Tobacco Use  . Smoking status: Former Smoker    Packs/day: 0.50    Years: 30.00    Pack years: 15.00    Types: Cigarettes    Last attempt to quit: 1980    Years since quitting: 39.8  . Smokeless tobacco: Never Used  Substance Use Topics  . Alcohol use: Yes    Alcohol/week: 1.0 standard drinks    Types: 1 Cans of beer per week  . Drug use: No     Allergies   Penicillins   Review of Systems Review of Systems  Cardiovascular: Positive for syncope. Negative for chest pain.  Neurological: Negative for focal weakness and light-headedness.  All other systems reviewed and are negative.    Physical Exam Updated Vital Signs BP 102/60   Pulse 64   Temp 98.1 F  (36.7 C) (Oral)   Resp (!) 23   Ht 5\' 10"  (1.778 m)   Wt 96 kg   SpO2 94%   BMI 30.37 kg/m   Physical Exam  Constitutional: He is oriented to person, place, and time. He appears well-developed and well-nourished.  HENT:  Head: Normocephalic.  Right Ear: External ear normal.  Left Ear: External ear normal.  Nose: Nose normal.  Mouth/Throat: Oropharynx is clear and moist.  Eyes: Pupils are equal, round, and reactive to light. Conjunctivae and EOM are normal.  Neck: Normal range of motion.  Cardiovascular: Normal rate and regular rhythm.  Pulmonary/Chest: Effort normal.  Abdominal: Soft. He exhibits no distension.  Musculoskeletal: Normal range of motion.  Neurological: He is alert and oriented to person, place, and time.  Skin: Skin is warm.  Psychiatric: He has a normal mood and affect.  Nursing note and vitals reviewed.    ED Treatments / Results  Labs (all labs ordered are listed, but only abnormal results are displayed) Labs Reviewed  BASIC METABOLIC PANEL - Abnormal; Notable for the following components:      Result Value   CO2 20 (*)    Glucose, Bld 186 (*)    BUN 28 (*)    Creatinine, Ser 2.12 (*)    Calcium 8.3 (*)    GFR calc non Af Amer 27 (*)    GFR calc Af Amer 32 (*)    All other components within normal limits  CBC - Abnormal; Notable for the following components:   WBC 14.5 (*)    RBC 4.09 (*)    Hemoglobin 11.8 (*)    HCT 37.0 (*)    All other components within normal limits  URINALYSIS, ROUTINE W REFLEX MICROSCOPIC - Abnormal; Notable for the following components:   Color, Urine AMBER (*)    APPearance HAZY (*)    Hgb urine dipstick MODERATE (*)    Leukocytes, UA LARGE (*)    Bacteria, UA RARE (*)    All other components within normal limits  CBG MONITORING, ED - Abnormal; Notable for the following components:   Glucose-Capillary 185 (*)    All other components within normal limits  TROPONIN I  COMPREHENSIVE METABOLIC PANEL    EKG EKG  Interpretation  Date/Time:  Monday July 01 2018 05:48:44 EDT Ventricular Rate:  99 PR Interval:    QRS Duration: 115 QT Interval:  375 QTC Calculation: 482 R Axis:   126 Text Interpretation:  Atrial fibrillation Left posterior fascicular block Confirmed by Veryl Speak [phone]) on 07/01/2018 6:06:37 AM  Also confirmed by Veryl Speak [phone]), editor Hattie Perch [phone])  on 07/01/2018 7:05:57 AM   Radiology Dg Chest 2 View  Result Date: 07/01/2018 CLINICAL DATA:  Shortness of breath EXAM: CHEST - 2 VIEW COMPARISON:  December 18, 2017 FINDINGS: There Is no appreciable edema or consolidation. There is mild atelectatic change in the left base. There is no consolidation. Heart is mildly enlarged with pulmonary vascularity normal. Pacemaker leads are attached to the right atrium and right ventricle. No evident adenopathy. There is degenerative change in the thoracic spine. There is a stent in the abdominal aortic region, incompletely visualized. There is aortic atherosclerosis. IMPRESSION: Mild left base atelectasis. No edema or consolidation. Heart mildly enlarged with pacemaker leads as described. There is aortic atherosclerosis. Aortic Atherosclerosis (ICD10-I70.0). Electronically Signed   By: Lowella Grip III M.D.   On: 07/01/2018 07:21   Ct Head Wo Contrast  Result Date: 07/01/2018 CLINICAL DATA:  Recent fall with left-sided headaches, initial encounter EXAM: CT HEAD WITHOUT CONTRAST TECHNIQUE: Contiguous axial images were obtained from the base of the skull through the vertex without intravenous contrast. COMPARISON:  10/15/2017 FINDINGS: Brain: Lacunar infarct is again noted in the left basal ganglia. No findings to suggest acute hemorrhage, acute infarction or space-occupying mass lesion are noted. Mild atrophic changes are noted and stable. Vascular: No hyperdense vessel or unexpected calcification. Skull: Normal. Negative for fracture or focal lesion. Sinuses/Orbits: No acute  finding. Other: None. 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 No acute intracranial abnormality is noted. Electronically Signed   By: Inez Catalina M.D.   On: 07/01/2018 08:23    Procedures Procedures (including critical care time)  Medications Ordered in ED Medications - No data to display   Initial Impression / Assessment and Plan / ED Course  I have reviewed the triage vital signs and the nursing notes.  Pertinent labs & imaging results that were available during my care of the patient were reviewed by me and considered in my medical decision making (see chart for details).     MDM  Pt has elevated lft's and elevated renal functions.  Troponin is elevated at 0.10  Pacemaker evaluated.  No report from [facility] yet.   Ct scan ifs normal.   Pt will need admission for further evaluation.  Dr. Regenia Skeeter in to see and examine. He advised ultrasound of abdomen.  Pt is followed by Dr. Delfina Redwood.  Hospitalist consulted.  Dr, Lorin Mercy will see for evaluation  Final Clinical Impressions(s) / ED Diagnoses   Final diagnoses:  Syncope, unspecified syncope type  AKI (acute kidney injury) ([place])  Liver function abnormality  Urinary tract infection without hematuria, site unspecified  Troponin level elevated    ED Discharge Orders    None       Fransico Meadow, [place] 07/01/18 1046    Sherwood Gambler, MD 07/01/18 1544

## 2018-07-01 NOTE — ED Notes (Signed)
Patient transported to X-ray 

## 2018-07-01 NOTE — ED Notes (Signed)
Pt states he plays golf daily and when he plays golf, he removes his urinary catheter and replaces it with a plug.

## 2018-07-01 NOTE — ED Notes (Signed)
Family states pt told them he didn't feel good yesterday. When family asked what did he mean pt responded with "I don't know, I just don't feel good. I feel kind of weak and not hungry."

## 2018-07-02 ENCOUNTER — Observation Stay (HOSPITAL_BASED_OUTPATIENT_CLINIC_OR_DEPARTMENT_OTHER): Payer: Medicare Other

## 2018-07-02 DIAGNOSIS — I4819 Other persistent atrial fibrillation: Secondary | ICD-10-CM | POA: Diagnosis not present

## 2018-07-02 DIAGNOSIS — R188 Other ascites: Secondary | ICD-10-CM | POA: Diagnosis present

## 2018-07-02 DIAGNOSIS — I480 Paroxysmal atrial fibrillation: Secondary | ICD-10-CM | POA: Diagnosis not present

## 2018-07-02 DIAGNOSIS — R112 Nausea with vomiting, unspecified: Secondary | ICD-10-CM | POA: Diagnosis not present

## 2018-07-02 DIAGNOSIS — K219 Gastro-esophageal reflux disease without esophagitis: Secondary | ICD-10-CM | POA: Diagnosis not present

## 2018-07-02 DIAGNOSIS — D62 Acute posthemorrhagic anemia: Secondary | ICD-10-CM | POA: Diagnosis present

## 2018-07-02 DIAGNOSIS — K2210 Ulcer of esophagus without bleeding: Secondary | ICD-10-CM | POA: Diagnosis present

## 2018-07-02 DIAGNOSIS — N179 Acute kidney failure, unspecified: Secondary | ICD-10-CM | POA: Diagnosis present

## 2018-07-02 DIAGNOSIS — T83518A Infection and inflammatory reaction due to other urinary catheter, initial encounter: Secondary | ICD-10-CM | POA: Diagnosis present

## 2018-07-02 DIAGNOSIS — I21A1 Myocardial infarction type 2: Secondary | ICD-10-CM | POA: Diagnosis present

## 2018-07-02 DIAGNOSIS — Z66 Do not resuscitate: Secondary | ICD-10-CM | POA: Diagnosis present

## 2018-07-02 DIAGNOSIS — I340 Nonrheumatic mitral (valve) insufficiency: Secondary | ICD-10-CM

## 2018-07-02 DIAGNOSIS — K8000 Calculus of gallbladder with acute cholecystitis without obstruction: Secondary | ICD-10-CM | POA: Diagnosis not present

## 2018-07-02 DIAGNOSIS — N390 Urinary tract infection, site not specified: Secondary | ICD-10-CM | POA: Diagnosis present

## 2018-07-02 DIAGNOSIS — R7881 Bacteremia: Secondary | ICD-10-CM | POA: Diagnosis present

## 2018-07-02 DIAGNOSIS — R55 Syncope and collapse: Secondary | ICD-10-CM

## 2018-07-02 DIAGNOSIS — R109 Unspecified abdominal pain: Secondary | ICD-10-CM | POA: Diagnosis not present

## 2018-07-02 DIAGNOSIS — R7989 Other specified abnormal findings of blood chemistry: Secondary | ICD-10-CM | POA: Diagnosis not present

## 2018-07-02 DIAGNOSIS — E440 Moderate protein-calorie malnutrition: Secondary | ICD-10-CM | POA: Diagnosis not present

## 2018-07-02 DIAGNOSIS — I4821 Permanent atrial fibrillation: Secondary | ICD-10-CM | POA: Diagnosis present

## 2018-07-02 DIAGNOSIS — K920 Hematemesis: Secondary | ICD-10-CM | POA: Diagnosis not present

## 2018-07-02 DIAGNOSIS — K7460 Unspecified cirrhosis of liver: Secondary | ICD-10-CM | POA: Diagnosis not present

## 2018-07-02 DIAGNOSIS — K7689 Other specified diseases of liver: Secondary | ICD-10-CM | POA: Diagnosis not present

## 2018-07-02 DIAGNOSIS — K5710 Diverticulosis of small intestine without perforation or abscess without bleeding: Secondary | ICD-10-CM | POA: Diagnosis present

## 2018-07-02 DIAGNOSIS — K8040 Calculus of bile duct with cholecystitis, unspecified, without obstruction: Secondary | ICD-10-CM | POA: Diagnosis not present

## 2018-07-02 DIAGNOSIS — K8020 Calculus of gallbladder without cholecystitis without obstruction: Secondary | ICD-10-CM | POA: Diagnosis not present

## 2018-07-02 DIAGNOSIS — R197 Diarrhea, unspecified: Secondary | ICD-10-CM | POA: Diagnosis not present

## 2018-07-02 DIAGNOSIS — K8062 Calculus of gallbladder and bile duct with acute cholecystitis without obstruction: Secondary | ICD-10-CM | POA: Diagnosis present

## 2018-07-02 DIAGNOSIS — K922 Gastrointestinal hemorrhage, unspecified: Secondary | ICD-10-CM | POA: Diagnosis not present

## 2018-07-02 DIAGNOSIS — I4891 Unspecified atrial fibrillation: Secondary | ICD-10-CM | POA: Diagnosis not present

## 2018-07-02 DIAGNOSIS — I129 Hypertensive chronic kidney disease with stage 1 through stage 4 chronic kidney disease, or unspecified chronic kidney disease: Secondary | ICD-10-CM | POA: Diagnosis present

## 2018-07-02 DIAGNOSIS — K226 Gastro-esophageal laceration-hemorrhage syndrome: Secondary | ICD-10-CM | POA: Diagnosis present

## 2018-07-02 DIAGNOSIS — B9620 Unspecified Escherichia coli [E. coli] as the cause of diseases classified elsewhere: Secondary | ICD-10-CM | POA: Diagnosis present

## 2018-07-02 DIAGNOSIS — D649 Anemia, unspecified: Secondary | ICD-10-CM | POA: Diagnosis not present

## 2018-07-02 DIAGNOSIS — I951 Orthostatic hypotension: Secondary | ICD-10-CM | POA: Diagnosis present

## 2018-07-02 DIAGNOSIS — K319 Disease of stomach and duodenum, unspecified: Secondary | ICD-10-CM | POA: Diagnosis present

## 2018-07-02 DIAGNOSIS — R945 Abnormal results of liver function studies: Secondary | ICD-10-CM | POA: Diagnosis not present

## 2018-07-02 DIAGNOSIS — I495 Sick sinus syndrome: Secondary | ICD-10-CM | POA: Diagnosis present

## 2018-07-02 DIAGNOSIS — D696 Thrombocytopenia, unspecified: Secondary | ICD-10-CM | POA: Diagnosis present

## 2018-07-02 DIAGNOSIS — K8050 Calculus of bile duct without cholangitis or cholecystitis without obstruction: Secondary | ICD-10-CM | POA: Diagnosis not present

## 2018-07-02 DIAGNOSIS — I4892 Unspecified atrial flutter: Secondary | ICD-10-CM | POA: Diagnosis not present

## 2018-07-02 DIAGNOSIS — Y846 Urinary catheterization as the cause of abnormal reaction of the patient, or of later complication, without mention of misadventure at the time of the procedure: Secondary | ICD-10-CM | POA: Diagnosis present

## 2018-07-02 DIAGNOSIS — K2270 Barrett's esophagus without dysplasia: Secondary | ICD-10-CM | POA: Diagnosis not present

## 2018-07-02 DIAGNOSIS — R932 Abnormal findings on diagnostic imaging of liver and biliary tract: Secondary | ICD-10-CM | POA: Diagnosis not present

## 2018-07-02 DIAGNOSIS — K449 Diaphragmatic hernia without obstruction or gangrene: Secondary | ICD-10-CM | POA: Diagnosis not present

## 2018-07-02 DIAGNOSIS — N183 Chronic kidney disease, stage 3 (moderate): Secondary | ICD-10-CM | POA: Diagnosis not present

## 2018-07-02 DIAGNOSIS — K3189 Other diseases of stomach and duodenum: Secondary | ICD-10-CM | POA: Diagnosis not present

## 2018-07-02 DIAGNOSIS — K810 Acute cholecystitis: Secondary | ICD-10-CM | POA: Diagnosis not present

## 2018-07-02 DIAGNOSIS — B961 Klebsiella pneumoniae [K. pneumoniae] as the cause of diseases classified elsewhere: Secondary | ICD-10-CM | POA: Diagnosis present

## 2018-07-02 LAB — CBC
HCT: 32.7 % — ABNORMAL LOW (ref 39.0–52.0)
Hemoglobin: 10.4 g/dL — ABNORMAL LOW (ref 13.0–17.0)
MCH: 28.6 pg (ref 26.0–34.0)
MCHC: 31.8 g/dL (ref 30.0–36.0)
MCV: 89.8 fL (ref 80.0–100.0)
PLATELETS: 127 10*3/uL — AB (ref 150–400)
RBC: 3.64 MIL/uL — AB (ref 4.22–5.81)
RDW: 15.4 % (ref 11.5–15.5)
WBC: 11.7 10*3/uL — ABNORMAL HIGH (ref 4.0–10.5)
nRBC: 0 % (ref 0.0–0.2)

## 2018-07-02 LAB — COMPREHENSIVE METABOLIC PANEL
ALK PHOS: 219 U/L — AB (ref 38–126)
ALT: 345 U/L — AB (ref 0–44)
AST: 184 U/L — AB (ref 15–41)
Albumin: 2.7 g/dL — ABNORMAL LOW (ref 3.5–5.0)
Anion gap: 7 (ref 5–15)
BILIRUBIN TOTAL: 4.8 mg/dL — AB (ref 0.3–1.2)
BUN: 39 mg/dL — AB (ref 8–23)
CALCIUM: 8.5 mg/dL — AB (ref 8.9–10.3)
CO2: 22 mmol/L (ref 22–32)
CREATININE: 2.45 mg/dL — AB (ref 0.61–1.24)
Chloride: 106 mmol/L (ref 98–111)
GFR, EST AFRICAN AMERICAN: 27 mL/min — AB (ref >60)
GFR, EST NON AFRICAN AMERICAN: 23 mL/min — AB (ref >60)
Glucose, Bld: 161 mg/dL — ABNORMAL HIGH (ref 70–99)
Potassium: 3.3 mmol/L — ABNORMAL LOW (ref 3.5–5.1)
SODIUM: 135 mmol/L (ref 135–145)
TOTAL PROTEIN: 6 g/dL — AB (ref 6.5–8.1)

## 2018-07-02 LAB — GLUCOSE, CAPILLARY
GLUCOSE-CAPILLARY: 104 mg/dL — AB (ref 70–99)
GLUCOSE-CAPILLARY: 105 mg/dL — AB (ref 70–99)
GLUCOSE-CAPILLARY: 119 mg/dL — AB (ref 70–99)
Glucose-Capillary: 158 mg/dL — ABNORMAL HIGH (ref 70–99)
Glucose-Capillary: 97 mg/dL (ref 70–99)

## 2018-07-02 LAB — TROPONIN I
TROPONIN I: 0.93 ng/mL — AB (ref <0.03)
Troponin I: 0.52 ng/mL (ref <0.03)
Troponin I: 0.37 ng/mL (ref <0.03)
Troponin I: 0.3 ng/mL (ref <0.03)

## 2018-07-02 LAB — HEMOGLOBIN A1C
Hgb A1c MFr Bld: 5.7 % — ABNORMAL HIGH (ref 4.8–5.6)
Mean Plasma Glucose: 117 mg/dL

## 2018-07-02 LAB — PROTIME-INR
INR: 1.43
PROTHROMBIN TIME: 17.3 s — AB (ref 11.4–15.2)

## 2018-07-02 LAB — HEPATITIS PANEL, ACUTE
HCV Ab: 0.1 s/co ratio (ref 0.0–0.9)
HEP A IGM: NEGATIVE
HEP B C IGM: NEGATIVE
Hepatitis B Surface Ag: NEGATIVE

## 2018-07-02 LAB — ECHOCARDIOGRAM COMPLETE
Height: 70 in
Weight: 3433.6 oz

## 2018-07-02 MED ORDER — HEPARIN (PORCINE) IN NACL 100-0.45 UNIT/ML-% IJ SOLN
1700.0000 [IU]/h | INTRAMUSCULAR | Status: DC
  Administered 2018-07-02: 1200 [IU]/h via INTRAVENOUS
  Filled 2018-07-02 (×2): qty 250

## 2018-07-02 MED ORDER — METOPROLOL SUCCINATE ER 25 MG PO TB24
12.5000 mg | ORAL_TABLET | Freq: Two times a day (BID) | ORAL | Status: DC
  Administered 2018-07-02 – 2018-07-03 (×3): 12.5 mg via ORAL
  Filled 2018-07-02 (×3): qty 1

## 2018-07-02 NOTE — Consult Note (Addendum)
Cardiology Consultation:   Patient ID: Ernest Parker; [id number]; 09/08/1934   Admit date: 07/01/2018 Date of Consult: 07/02/2018  Primary Care Provider: Seward Carol, MD Primary Cardiologist: [name], MD Primary Electrophysiologist:  Dr. Lovena Le   Patient Profile:   Ernest Parker is a 84 y.o. male with a PMH of atrial flutter, SSS s/p PPM, HTN, HLD, AAA s/p stent graft 02/2018 in [place], prostate CA s/p radiation with chronic indwelling foley, and GERD, who is being seen today for the evaluation of syncope and elevated troponins at the request of Dr. Erlinda Hong.  History of Present Illness:   [name] was in his usual state of health until the morning of 07/01/18 when he experienced an episode of unresponsiveness lasting for ~30 minutes. The event occurred around 4:30am when family heard a thud/moan and the patient was found on the floor beside his bed. He reported feeling unwell on Sunday with poor po intake, chills, and fatigue but states he felt well on Saturday and played golf. He does not remember the events surrounding his presumed syncopal episode until he was being loaded on a stretcher by EMS.   He was last seen by cardiology outpatient, Dr. [name], on 04/10/2018 in follow-up of PPM implantation. He reported being diagnosed with an AAA while in [place] and underwent surgical repair with mesh c/b urinary retention. He was doing well from a cardiac standpoint and no medication changes occurred at this visit. He has not had an ischemic evaluation in >20 years.     At the time of this evaluation he feels back to his usual self. No complaints of chest pain before or after the episode. He continues to be fairly active, playing golf regularly. No exertional chest pain, no change in chronic DOE. He reports occasional lightheadedness with sudden changes in position but no prior syncope. He denies orthopnea, PND, LE edema, melena, hematochezia, or hematuria.   Hospital course:  BP on the soft side, intermittently tachypneic; otherwise VSS. Labs notable for K 3.3, Cr 2.12>2.45 (baseline 1.6), AST 398>184, ALT 537>345, Tbili 6.1, WBC 14.5, Hgb 11.8, PLT 182, Trop 0.1>0.89>0.93>0.52. CXR without acute findings; aortic atherosclerosis noted. CT Head with old lacunar infarct, no acute findings. Abdominal US with cholelithiasis without evidence of cholecystitis or biliary obstruction; 9.7cm AAA with intraluminal stent graft. EKG with atrial pacing. PPM interrogated and revealed atrial undersensing which was adjusted. GI following and suspects transaminitis is 2/2 liver hypoperfusion in the setting of syncope. Cardiology asked to evaluate for syncope and elevated troponin.   Past Medical History:  Diagnosis Date  . A-fib ([place])   . Dizzy   . Dyspnea   . GERD (gastroesophageal reflux disease)   . Hard of hearing    wears hearing aids bilat  . High cholesterol   . Hypertension   . Presence of permanent cardiac pacemaker 12/17/2017  . Prostate cancer ([place]) 2002   S/P "radiation for 8-9 weeks"  . SOB (shortness of breath)     Past Surgical History:  Procedure Laterality Date  . INGUINAL HERNIA REPAIR Bilateral   . INSERT / REPLACE / REMOVE PACEMAKER  12/17/2017  . PACEMAKER IMPLANT N/A 12/17/2017   Procedure: PACEMAKER IMPLANT;  Surgeon: Evans Lance, MD;  Location: [facility];  Service: Cardiovascular;  Laterality: N/A;  . PROSTATE BIOPSY    . THORACIC AORTIC ANEURYSM REPAIR       Home Medications:  Prior to Admission medications   Medication Sig Start Date End Date  Taking? Authorizing Provider  apixaban (ELIQUIS) 2.5 MG TABS tablet Take 1 tablet (2.5 mg total) by mouth 2 (two) times daily. 10/23/17  Yes Evans Lance, MD  Cholecalciferol (VITAMIN D3) 2000 units TABS Take 2,000 Units by mouth daily.    Yes [provider]  diltiazem (CARDIZEM CD) 180 MG 24 hr capsule Take 1 capsule (180 mg total) by mouth daily. 04/30/18  Yes Evans Lance, MD    furosemide (LASIX) 40 MG tablet TAKE 1 TABLET BY MOUTH EVERY DAY Patient taking differently: Take 40 mg by mouth daily.  05/27/18  Yes Evans Lance, MD  metoprolol succinate (TOPROL-XL) 100 MG 24 hr tablet TAKE 1 TABLET TWICE DAILY IMMEDIATELY FOLLOWING MEAL Patient taking differently: Take 100 mg by mouth 2 (two) times daily.  05/07/18  Yes Evans Lance, MD  Red Yeast Rice 600 MG CAPS Take 600 mg by mouth daily.    Yes [provider]  tamsulosin (FLOMAX) 0.4 MG CAPS capsule Take 1 capsule (0.4 mg total) by mouth daily after supper. Patient taking differently: Take 0.4 mg by mouth 2 (two) times daily.  03/15/18  Yes Evans Lance, MD    Inpatient Medications: Scheduled Meds: . apixaban  2.5 mg Oral BID  . docusate sodium  100 mg Oral BID  . insulin aspart  0-9 Units Subcutaneous TID WC  . metoprolol succinate  12.5 mg Oral BID  . sodium chloride flush  3 mL Intravenous Q12H  . tamsulosin  0.4 mg Oral BID   Continuous Infusions: . lactated ringers 75 mL/hr at 07/01/18 1705   PRN Meds: acetaminophen **OR** acetaminophen, morphine injection, ondansetron **OR** ondansetron (ZOFRAN) IV  Allergies:    Allergies  Allergen Reactions  . Penicillins Other (See Comments)    Patient can't remember reaction was told by mother was allergic as a child Has patient had a PCN reaction causing immediate rash, facial/tongue/throat swelling, SOB or lightheadedness with hypotension: Unknown Has patient had a PCN reaction causing severe rash involving mucus membranes or skin necrosis: Unknown Has patient had a PCN reaction that required hospitalization: Unknown Has patient had a PCN reaction occurring within the last 10 years: No If all of the above answers are "NO", then may pro    Social History:   Social History   Socioeconomic History  . Marital status: Widowed    Spouse name: Not on file  . Number of children: Not on file  . Years of education: Not on file  . Highest  education level: Not on file  Occupational History  . Occupation: RETIRED  Social Needs  . Financial resource strain: Not on file  . Food insecurity:    Worry: Not on file    Inability: Not on file  . Transportation needs:    Medical: Not on file    Non-medical: Not on file  Tobacco Use  . Smoking status: Former Smoker    Packs/day: 0.50    Years: 30.00    Pack years: 15.00    Types: Cigarettes    Last attempt to quit: 1980    Years since quitting: 39.8  . Smokeless tobacco: Never Used  Substance and Sexual Activity  . Alcohol use: Yes    Alcohol/week: 1.0 standard drinks    Types: 1 Cans of beer per week    Comment: rare  . Drug use: No  . Sexual activity: Not on file  Lifestyle  . Physical activity:    Days per week: Not on file  Minutes per session: Not on file  . Stress: Not on file  Relationships  . Social connections:    Talks on phone: Not on file    Gets together: Not on file    Attends religious service: Not on file    Active member of club or organization: Not on file    Attends meetings of clubs or organizations: Not on file    Relationship status: Not on file  . Intimate partner violence:    Fear of current or ex partner: Not on file    Emotionally abused: Not on file    Physically abused: Not on file    Forced sexual activity: Not on file  Other Topics Concern  . 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 Lives alone during the summer in [place] and lives with daughter during the Winter in [place], moving to [place]. Has 1 daughter and 2 sons. One sone lives in [place] and one in [place].   Is an avid golfer, plays 4-5 days per week. Likes to do yardwork.     Family History:    Family History  Problem Relation Age of Onset  . Hypertension Mother   . Tuberculosis Father   . Kidney disease Sister   . Sleep apnea Sister      ROS:  Please see the history of present illness.     All other ROS reviewed and negative.     Physical Exam/Data:   Vitals:   07/01/18 2125 07/02/18 0145 07/02/18 0203 07/02/18 0559  BP: (!) 99/46 110/65  108/65  Pulse: 64 63  88  Resp:  18  18  Temp:  97.8 F (36.6 C)  98.6 F (37 C)  TempSrc:  Oral  Oral  SpO2:  95%  94%  Weight:   97.3 kg   Height:        Intake/Output Summary (Last 24 hours) at 07/02/2018 1020 Last data filed at 07/02/2018 0557 Gross per 24 hour  Intake 683.75 ml  Output 826 ml  Net -142.25 ml   Filed Weights   07/01/18 0550 07/01/18 1358 07/02/18 0203  Weight: 96 kg 97.2 kg 97.3 kg   Body mass index is 30.79 kg/m.  General:  Well nourished, well developed, sitting upright in bed in no acute distress.  HEENT: sclera anicteric  Neck: no JVD Vascular: No carotid bruits; distal pulses 2+ bilaterally Cardiac:  normal S1, S2; IRRR; no murmurs, rubs, or gallops Lungs:  clear to auscultation bilaterally, no wheezing, rhonchi or rales  Abd: NABS, soft, nontender, no hepatomegaly Ext: no edema Musculoskeletal:  No deformities, BUE and BLE strength normal and equal Skin: warm and dry  Neuro:  CNs 2-12 intact, no focal abnormalities noted Psych:  Normal affect   EKG:  The EKG was personally reviewed and demonstrates:  Atrial paced Telemetry:  Telemetry was personally reviewed and demonstrates:  Atrial paced rhythm  Relevant CV Studies: Echocardiogram 09/2017: Study Conclusions  - Left ventricle: The cavity size was normal. Wall thickness was   increased in a pattern of moderate LVH. Systolic function was   mildly reduced. The estimated ejection fraction was in the range   of 45% to 50%. Diffuse hypokinesis. The study is not technically   sufficient to allow evaluation of LV diastolic function. - Mitral valve: Mildly thickened leaflets . There was moderate   regurgitation. - Left atrium: The atrium was mildly dilated. -  Right ventricle: The cavity size was mildly dilated. Mildly   reduced  systolic function. - Tricuspid valve: There was mild regurgitation. - Pulmonary arteries: PA peak pressure: 27 mm Hg (S). - Inferior vena cava: The vessel was normal in size. The   respirophasic diameter changes were in the normal range (= 50%),   consistent with normal central venous pressure.  Impressions:  - LVEF 45-50%, moderate LVH, global hypokinesis (wall motion   complicated by atrial flutter with rapid ventricular response),   moderate MR, mild LAE, mild RVE with mild systolic dysfunction,   mild TR, RVSP 27 mmHg, normal IVC.  Laboratory Data:  Chemistry Recent Labs  Lab 07/01/18 0552 07/01/18 0710 07/02/18 0050  NA 136 135 135  K 3.9 3.9 3.3*  CL 105 104 106  CO2 20* 19* 22  GLUCOSE 186* 180* 161*  BUN 28* 25* 39*  CREATININE 2.12* 2.12* 2.45*  CALCIUM 8.3* 8.4* 8.5*  GFRNONAA 27* 27* 23*  GFRAA 32* 32* 27*  ANIONGAP 11 12 7     Recent Labs  Lab 07/01/18 0710 07/01/18 1647 07/02/18 0050  PROT 6.5 6.6 6.0*  ALBUMIN 2.9* 2.9* 2.7*  AST 398* 263* 184*  ALT 537* 453* 345*  ALKPHOS 273* 278* 219*  BILITOT 5.1* 6.1* 4.8*   Hematology Recent Labs  Lab 07/01/18 0552 07/02/18 0050  WBC 14.5* 11.7*  RBC 4.09* 3.64*  HGB 11.8* 10.4*  HCT 37.0* 32.7*  MCV 90.5 89.8  MCH 28.9 28.6  MCHC 31.9 31.8  RDW 14.8 15.4  PLT 182 127*   Cardiac Enzymes Recent Labs  Lab 07/01/18 0710 07/01/18 1445 07/02/18 0050 07/02/18 0748  TROPONINI 0.10* 0.89* 0.93* 0.52*   No results for input(s): TROPIPOC in the last 168 hours.  BNPNo results for input(s): BNP, PROBNP in the last 168 hours.  DDimer No results for input(s): DDIMER in the last 168 hours.  Radiology/Studies:  Dg Chest 2 View  Result Date: 07/01/2018 CLINICAL DATA:  Shortness of breath EXAM: CHEST - 2 VIEW COMPARISON:  December 18, 2017 FINDINGS: There Is no appreciable edema or consolidation. There is mild atelectatic change in the left base. There is no consolidation. Heart is mildly enlarged with  pulmonary vascularity normal. Pacemaker leads are attached to the right atrium and right ventricle. No evident adenopathy. There is degenerative change in the thoracic spine. There is a stent in the abdominal aortic region, incompletely visualized. There is aortic atherosclerosis. IMPRESSION: Mild left base atelectasis. No edema or consolidation. Heart mildly enlarged with pacemaker leads as described. There is aortic atherosclerosis. Aortic Atherosclerosis (ICD10-I70.0). Electronically Signed   By: Lowella Grip III M.D.   On: 07/01/2018 07:21   Ct Head Wo Contrast  Result Date: 07/01/2018 CLINICAL DATA:  Recent fall with left-sided headaches, initial encounter EXAM: CT HEAD WITHOUT CONTRAST TECHNIQUE: Contiguous axial images were obtained from the base of the skull through the vertex without intravenous contrast. COMPARISON:  10/15/2017 FINDINGS: Brain: Lacunar infarct is again noted in the left basal ganglia. No findings to suggest acute hemorrhage, acute infarction or space-occupying mass lesion are noted. Mild atrophic changes are noted and stable. Vascular: No hyperdense vessel or unexpected calcification. Skull: Normal. Negative for fracture or focal lesion. Sinuses/Orbits: No acute finding. Other: None. 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 No acute intracranial abnormality is noted. Electronically Signed   By: Inez Catalina M.D.   On: 07/01/2018 08:23   US Abdomen Complete  Result Date: 07/01/2018 CLINICAL DATA:  Elevated LFTs EXAM: COMPLETE  ABDOMINAL ULTRASOUND COMPARISON:  None available FINDINGS: Gallbladder: Intraluminal sludge and multiple calculi measuring up to 1.1 cm in the gallbladder. Normal wall thickness 2.4 mm. No pericholecystic fluid. Sonographer describes no sonographic Murphy's sign. Common bile duct:  Normal in caliber for age, 7.10mm diameter. Liver: Homogeneous in echotexture. Mildly complex 3.3 cm cyst in the left lobe. No intrahepatic biliary ductal  dilatation. Antegrade color Doppler flow signal in the main portal vein. IVC:  Negative Pancreas: Visualized segments unremarkable, portions obscured by overlying bowel gas. Spleen:  No focal lesion, craniocaudal 7.1cm in length. Right Kidney:  No mass or hydronephrosis, 12.2cm in length. Left Kidney:  No lesion or hydronephrosis, 12.2cm in length. Abdominal aorta: Native aneurysm sac 9.7 cm transverse diameter, with apparent intraluminal stent graft partially seen. IMPRESSION: 1. Cholelithiasis without other ultrasound evidence of cholecystitis or biliary obstruction. 2. 3.3 cm complex hepatic cyst, left lobe. 3. 9.7 cm native abdominal aortic aneurysm diameter, with apparent intraluminal stent graft, incompletely evaluated. Electronically Signed   By: Lucrezia Europe M.D.   On: 07/01/2018 12:59    Assessment and Plan:   1. Syncope and elevated troponins: Family heard noise from patients room around 4:30 am 07/01/18, where he was found on the floor laying on his back, awake but unresponsive. He regained alertness after 25-30 minutes but did not recall events. Poor po intake the day prior to episode. No complaints of chest pain. EKG with atrial paced rhythm. Trop 0.1>0.89>0.93>0.52. PPM interrogation without significant arrhythmias to explain syncope. He has not had any recent ischemic testing. Episode sounds c/w orthostasis given change in position and likely dehydration from poor po intake the day before, but cannot exclude ischemic cause given rise in troponin. Unfortunately Cr continues to rise, 2.45 today from baseline 1.6.- Will hold eliquis at this time and start a heparin gtt for possible ACS - If Cr improves over the next 24-48 hours, may consider LHC to further evaluate elevated troponins and syncope.  - Will follow-up echocardiogram to evaluate LV function   2. Atrial flutter: Rate controlled with diltiazem and metoprolol outpatient. Home diltiazem held and metoprolol dose reduced in the setting of  soft BP's. He is on eliquis for CHA2DS2-VASc Score of at least 6 (HTN, Aortic Plaque on CXR, Age >63, and old stroke noted on CT Head) - dose adjusted given age and CKD.  - Continue metoprolol for rate control and titrate dose based on BP - Will hold eliquis at this time and start heparin in the event that Cr improves and LHC is possible.   3. HTN: BP on the soft side. Home diltiazem held and metoprolol dose reduced.  - Continue current regimen for now  4. SSS s/p PPM: PPM interrogated this admission and noted to have atrial undersensing which was adjusted.  - Continue routine outpatient monitoring of PPM function  5. Transaminitis: GI following. LFTs improving. Possibly 2/2 transient hypoperfusion in the setting of syncope.  - Continue management per primary team and GI.   6. AAA s/p stent grafting 02/2018:  - Continue routine outpatient monitoring.      For questions or updates, please contact [place] Please consult [email or web address] for contact info under Cardiology/STEMI.   Signed, Abigail Butts, PA-C  07/02/2018 10:20 AM [phone]  I have personally seen and examined this patient with Roby Lofts, PA-C. I agree with the assessment and plan as outlined above. He is admitted with syncope. History of atrial fib/flutter and SSS with PPM in place. He was feeling poorly  on Sunday with poor po intake. His syncopal event was not preceded by dizziness or chest pain. Troponin mildly elevated. EKG atrial paced with inferior Q waves. Exam shows an elderly male in NAD. He is eating lunch. RJ:PVGKK irreg. Pulm: clear bilaterally. Ext: no edema. Abd: soft, NT Telemetry with atrial fibrillation, rate controlled Pacemaker interrogation complete  Labs reviewed by me.   Syncope: The cause of his syncope is unclear. This could represent orthostasis in the setting of poor po intake. His troponin is mildly elevated. He has had no chest pain. His presentation is most likely not ACS. Agree  with holding Eliquis and starting IV heparin in case invasive procedures are indicated. Agree with echo today to assess LV systolic function. Given his renal insufficiency, he would not be a cath candidate at this time. We will follow up on his echo tomorrow and make further recommendations regarding further cardiac workup.   Lauree Chandler 07/02/2018 1:09 PM

## 2018-07-02 NOTE — Progress Notes (Signed)
  Echocardiogram 2D Echocardiogram has been performed.  Darlina Sicilian M 07/02/2018, 11:31 AM

## 2018-07-02 NOTE — Progress Notes (Signed)
ANTICOAGULATION CONSULT NOTE - Initial Consult  Pharmacy Consult for heparin Indication: chest pain/ACS  Allergies  Allergen Reactions  . Penicillins Other (See Comments)    Patient can't remember reaction was told by mother was allergic as a child Has patient had a PCN reaction causing immediate rash, facial/tongue/throat swelling, SOB or lightheadedness with hypotension: Unknown Has patient had a PCN reaction causing severe rash involving mucus membranes or skin necrosis: Unknown Has patient had a PCN reaction that required hospitalization: Unknown Has patient had a PCN reaction occurring within the last 10 years: No If all of the above answers are "NO", then may pro    Patient Measurements: Height: 5\' 10"  (177.8 cm) Weight: 214 lb 9.6 oz (97.3 kg) IBW/kg (Calculated) : 73 Heparin Dosing Weight: 93 kg   Vital Signs: Temp: 98.2 F (36.8 C) (10/22 1139) Temp Source: Oral (10/22 1139) BP: 116/76 (10/22 1139) Pulse Rate: 72 (10/22 1139)  Labs: Recent Labs    07/01/18 0552  07/01/18 0710 07/01/18 1445 07/01/18 1647 07/01/18 2059 07/02/18 0050 07/02/18 0748  HGB 11.8*  --   --   --   --   --  10.4*  --   HCT 37.0*  --   --   --   --   --  32.7*  --   PLT 182  --   --   --   --   --  127*  --   LABPROT  --   --   --   --  14.4  --  17.3*  --   INR  --   --   --   --  1.13  --  1.43  --   CREATININE 2.12*  --  2.12*  --   --   --  2.45*  --   CKTOTAL  --   --   --   --   --  196  --   --   TROPONINI  --    < > 0.10* 0.89*  --   --  0.93* 0.52*   < > = values in this interval not displayed.    Estimated Creatinine Clearance: 27.2 mL/min (A) (by C-G formula based on SCr of 2.45 mg/dL (H)).   Medical History: Past Medical History:  Diagnosis Date  . A-fib ([place])   . Dizzy   . Dyspnea   . GERD (gastroesophageal reflux disease)   . Hard of hearing    wears hearing aids bilat  . High cholesterol   . Hypertension   . Presence of permanent cardiac pacemaker 12/17/2017   . Prostate cancer ([place]) 2002   S/P "radiation for 8-9 weeks"  . SOB (shortness of breath)     Medications:  Medications Prior to Admission  Medication Sig Dispense Refill Last Dose  . apixaban (ELIQUIS) 2.5 MG TABS tablet Take 1 tablet (2.5 mg total) by mouth 2 (two) times daily. 60 tablet 11 06/30/2018 at pm  . Cholecalciferol (VITAMIN D3) 2000 units TABS Take 2,000 Units by mouth daily.    06/30/2018 at Unknown time  . diltiazem (CARDIZEM CD) 180 MG 24 hr capsule Take 1 capsule (180 mg total) by mouth daily. 90 capsule 3 06/30/2018 at Unknown time  . furosemide (LASIX) 40 MG tablet TAKE 1 TABLET BY MOUTH EVERY DAY (Patient taking differently: Take 40 mg by mouth daily. ) 90 tablet 3 06/30/2018 at Unknown time  . metoprolol succinate (TOPROL-XL) 100 MG 24 hr tablet TAKE 1 TABLET TWICE DAILY IMMEDIATELY  FOLLOWING MEAL (Patient taking differently: Take 100 mg by mouth 2 (two) times daily. ) 180 tablet 3 06/30/2018 at pm  . Red Yeast Rice 600 MG CAPS Take 600 mg by mouth daily.    06/30/2018 at Unknown time  . tamsulosin (FLOMAX) 0.4 MG CAPS capsule Take 1 capsule (0.4 mg total) by mouth daily after supper. (Patient taking differently: Take 0.4 mg by mouth 2 (two) times daily. ) 30 capsule 0 06/30/2018 at Unknown time    Assessment: 88 YOM with h/o AFib on low dose apixaban at home. Pharmacy consulted to transition to IV heparin for ACS given troponin bump. Last dose of apixaban was this morning at 0951. H/H low, Plt down to 127k. SCr 2.45 (BL 1.6)   Goal of Therapy:  Heparin level 0.3-0.7 units/ml aPTT 66-102 seconds Monitor platelets by anticoagulation protocol: Yes   Plan:  -D/c apixaban -Start Heparin 1200 units/hr WITHOUT bolus at 2200 tonight -F/u 8 hr aPTT/HL -Monitor daily HL, aPTT and for s/s of bleeding  Albertina Parr, PharmD., BCPS Clinical Pharmacist Clinical phone for 07/02/18 until 3:30pm: [phone] If after 3:30pm, please refer to [facility] for unit-specific pharmacist

## 2018-07-02 NOTE — Progress Notes (Signed)
PROGRESS NOTE  Ernest Parker UGQ:[id number] DOB: 1933/11/30 DOA: 07/01/2018 PCP: Seward Carol, MD  Brief Summary:  H/o afib on Eliquis , s/p pacemaker in 09/2017, presenting with a period of unresponsiveness.   "I don't know, I ended up here."  About 424AM, his daughter and SIL heard a moan and thud.  They found him awake and breathing but he was unresponsive.  It was 25-30 minutes before he became coherent.    he awakes about twice a night to get up and empty his foley bag.  He remembers doing this about 1AM and then going back to bed. Presumably, the second time he got up overnight was when he lost consciousness.  The next thing he remembers is being on the stretcher going out to the ambulance.  No h/o similar.  Yesterday he didn't feel well - chilly, tired, not much of an appetite.   He did play golf Saturday and felt well.    Has a chronic indwelling foley since 02/2018, getting changed every 2weeks at [facility] urology Dr Gloriann Loan 's office, he recently just got an cardiac clearance for a planned prostate procedure.   ED Course:  Patient was in bed and family heard a thump.  He was on the floor - awake but unresponsive for about 20 minutes.  CT negative for bleed.  Troponin 0.10.  [employer] has interrogated pacer but not reading.  AKI but elevated LFTs.  Abdominal US pending.    HPI/Recap of past 24 hours:  Denies pain, no fever, reports feeling better Family at bedside  Assessment/Plan: Principal Problem:   Syncope and collapse Active Problems:   A-fib (HCC)   Hypertension   History of prostate cancer   Elevated LFTs   CKD (chronic kidney disease) stage 3, GFR 30-59 ml/min (HCC)   Syncope -Unclear etiology, does appear to have prolonged confusion (20-69mins) -He has orthostatic hypotension, Sbp dropped to 60 (from 103)upon standing up, heart rate increased to 104 (from 63), he is on ivf, add blood culture, am cortisol elvel, home meds lasix held -Troponin elevation, increased  to 0.9, he denies chest pain -Echo pending -cardiology consulted  PAF/flutter, bradycardia s/p pacemaker in 09/2017: -PPM interrogated this admission and noted to have atrial undersensing which was adjusted.  -On eliquis, on BB, home meds CCB held due to borderline bp -cardiology consulted  Elevated lft  No ab pain, no n/v, ck unremarkable GI consulted, suspect hypoperfusion mediated Will follow gi recommendation  AKI on CKDIII Cr 2.45, baseline 1.7, hold home meds lasix ua with rare bacteria, urine culture pending He does has leukocytosis on presentation but no fever  Indwelling foley Reports has it changed q2wks, last changed 2wks ago, I called urology Dr Gloriann Loan regarding foley management, he recommend switch foley in am , RN can switch, call urology if RN has difficulty with foley exchange ( please order foley exchange in am on 10/23), patient can follow up with Dr Gloriann Loan in the office unless acute issues with foley ua with rare bacteria, Urine culture pending   AAA s/p stent grafting in 02/2018    Code Status: DNR  Family Communication: patient and family at bedside  Disposition Plan: not ready to discharge   Consultants:  GI  cardiology  Procedures:  none  Antibiotics:  none   Objective: BP 116/76   Pulse 72   Temp 98.2 F (36.8 C) (Oral)   Resp 18   Ht 5\' 10"  (1.778 m)   Wt 97.3 kg  SpO2 96%   BMI 30.79 kg/m   Intake/Output Summary (Last 24 hours) at 07/02/2018 1741 Last data filed at 07/02/2018 1638 Gross per 24 hour  Intake 683.75 ml  Output 1351 ml  Net -667.25 ml   Filed Weights   07/01/18 0550 07/01/18 1358 07/02/18 0203  Weight: 96 kg 97.2 kg 97.3 kg    Exam: Patient is examined daily including today on 07/02/2018, exams remain the same as of yesterday except that has changed    General:  NAD  Cardiovascular: RRR  Respiratory: CTABL  Abdomen: Soft/ND/NT, positive BS  Musculoskeletal: No Edema, indwelling foley  Neuro: alert,  oriented   Data Reviewed: Basic Metabolic Panel: Recent Labs  Lab 07/01/18 0552 07/01/18 0710 07/02/18 0050  NA 136 135 135  K 3.9 3.9 3.3*  CL 105 104 106  CO2 20* 19* 22  GLUCOSE 186* 180* 161*  BUN 28* 25* 39*  CREATININE 2.12* 2.12* 2.45*  CALCIUM 8.3* 8.4* 8.5*   Liver Function Tests: Recent Labs  Lab 07/01/18 0710 07/01/18 1647 07/02/18 0050  AST 398* 263* 184*  ALT 537* 453* 345*  ALKPHOS 273* 278* 219*  BILITOT 5.1* 6.1* 4.8*  PROT 6.5 6.6 6.0*  ALBUMIN 2.9* 2.9* 2.7*   No results for input(s): LIPASE, AMYLASE in the last 168 hours. Recent Labs  Lab 07/01/18 1445 07/01/18 2059  AMMONIA 59* 41*   CBC: Recent Labs  Lab 07/01/18 0552 07/02/18 0050  WBC 14.5* 11.7*  HGB 11.8* 10.4*  HCT 37.0* 32.7*  MCV 90.5 89.8  PLT 182 127*   Cardiac Enzymes:   Recent Labs  Lab 07/01/18 0710 07/01/18 1445 07/01/18 2059 07/02/18 0050 07/02/18 0748 07/02/18 1435  CKTOTAL  --   --  196  --   --   --   TROPONINI 0.10* 0.89*  --  0.93* 0.52* 0.37*   BNP (last 3 results) No results for input(s): BNP in the last 8760 hours.  ProBNP (last 3 results) No results for input(s): PROBNP in the last 8760 hours.  CBG: Recent Labs  Lab 07/01/18 1816 07/01/18 2059 07/02/18 0739 07/02/18 1138 07/02/18 1635  GLUCAP 158* 156* 104* 105* 97    No results found for this or any previous visit (from the past 240 hour(s)).   Studies: No results found.  Scheduled Meds: . docusate sodium  100 mg Oral BID  . insulin aspart  0-9 Units Subcutaneous TID WC  . metoprolol succinate  12.5 mg Oral BID  . sodium chloride flush  3 mL Intravenous Q12H  . tamsulosin  0.4 mg Oral BID    Continuous Infusions: . heparin    . lactated ringers 75 mL/hr at 07/02/18 1248     Time spent: 68mins, case discussed with urology Dr Gloriann Loan,  I have personally reviewed and interpreted on  07/02/2018 daily labs, tele strips, imagings as discussed above under date review session and  assessment and plans.  I reviewed all nursing notes, pharmacy notes, consultant notes,  vitals, pertinent old records  I have discussed plan of care as described above with RN , patient and family on 07/02/2018   Florencia Reasons MD, PhD  [facility] Pager [phone]. If 7PM-7AM, please contact night-coverage at [email or web address], password [facility] 07/02/2018, 5:41 PM  LOS: 0 days

## 2018-07-02 NOTE — Progress Notes (Signed)
Daily Rounding Note  07/02/2018, 11:36 AM  LOS: 0 days   SUBJECTIVE:   Chief complaint:syncope and elvated LFTs   No weakness or dizzness  OBJECTIVE:         Vital signs in last 24 hours:    Temp:  [97.8 F (36.6 C)-98.8 F (37.1 C)] 98.6 F (37 C) (10/22 0559) Pulse Rate:  [59-88] 88 (10/22 0559) Resp:  [12-22] 18 (10/22 0559) BP: (99-129)/(46-72) 108/65 (10/22 0559) SpO2:  [93 %-99 %] 94 % (10/22 0559) Weight:  [97.2 kg-97.3 kg] 97.3 kg (10/22 0203) Last BM Date: 07/01/18 Filed Weights   07/01/18 0550 07/01/18 1358 07/02/18 0203  Weight: 96 kg 97.2 kg 97.3 kg   General: looks well, looks younger than 84   Heart: RRR Chest: clear bil.   Abdomen: soft, NT, ND.  No mass or HSM  Extremities: no CCE Neuro/Psych:  Oriented x 3.  Moves all 4s.  No tremors  Intake/Output from previous day: 10/21 0701 - 10/22 0700 In: 683.8 [P.O.:240; I.V.:443.8] Out: 826 [Urine:825; Stool:1]  Intake/Output this shift: No intake/output data recorded.  Lab Results: Recent Labs    07/01/18 0552 07/02/18 0050  WBC 14.5* 11.7*  HGB 11.8* 10.4*  HCT 37.0* 32.7*  PLT 182 127*   BMET Recent Labs    07/01/18 0552 07/01/18 0710 07/02/18 0050  NA 136 135 135  K 3.9 3.9 3.3*  CL 105 104 106  CO2 20* 19* 22  GLUCOSE 186* 180* 161*  BUN 28* 25* 39*  CREATININE 2.12* 2.12* 2.45*  CALCIUM 8.3* 8.4* 8.5*   LFT Recent Labs    07/01/18 0710 07/01/18 1445 07/01/18 1647 07/02/18 0050  PROT 6.5  --  6.6 6.0*  ALBUMIN 2.9*  --  2.9* 2.7*  AST 398*  --  263* 184*  ALT 537*  --  453* 345*  ALKPHOS 273*  --  278* 219*  BILITOT 5.1*  --  6.1* 4.8*  BILIDIR  --  3.7* 3.9*  --   IBILI  --   --  2.2*  --    PT/INR Recent Labs    07/01/18 1647 07/02/18 0050  LABPROT 14.4 17.3*  INR 1.13 1.43   Hepatitis Panel Recent Labs    07/01/18 1647  HEPBSAG Negative  HCVAB 0.1  HEPAIGM Negative  HEPBIGM Negative     Studies/Results: Dg Chest 2 View  Result Date: 07/01/2018 CLINICAL DATA:  Shortness of breath EXAM: CHEST - 2 VIEW COMPARISON:  December 18, 2017 FINDINGS: There Is no appreciable edema or consolidation. There is mild atelectatic change in the left base. There is no consolidation. Heart is mildly enlarged with pulmonary vascularity normal. Pacemaker leads are attached to the right atrium and right ventricle. No evident adenopathy. There is degenerative change in the thoracic spine. There is a stent in the abdominal aortic region, incompletely visualized. There is aortic atherosclerosis. IMPRESSION: Mild left base atelectasis. No edema or consolidation. Heart mildly enlarged with pacemaker leads as described. There is aortic atherosclerosis. Aortic Atherosclerosis (ICD10-I70.0). Electronically Signed   By: Lowella Grip III M.D.   On: 07/01/2018 07:21   Ct Head Wo Contrast  Result Date: 07/01/2018 CLINICAL DATA:  Recent fall with left-sided headaches, initial encounter EXAM: CT HEAD WITHOUT CONTRAST TECHNIQUE: Contiguous axial images were obtained from the base of the skull through the vertex without intravenous contrast. COMPARISON:  10/15/2017 FINDINGS: Brain: Lacunar infarct is again noted in the left basal ganglia. No  findings to suggest acute hemorrhage, acute infarction or space-occupying mass lesion are noted. Mild atrophic changes are noted and stable. Vascular: No hyperdense vessel or unexpected calcification. Skull: Normal. Negative for fracture or focal lesion. Sinuses/Orbits: No acute finding. Other: None. 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 No acute intracranial abnormality is noted. Electronically Signed   By: Inez Catalina M.D.   On: 07/01/2018 08:23   US Abdomen Complete  Result Date: 07/01/2018 CLINICAL DATA:  Elevated LFTs EXAM: COMPLETE ABDOMINAL ULTRASOUND COMPARISON:  None available FINDINGS: Gallbladder: Intraluminal sludge and multiple calculi measuring up to  1.1 cm in the gallbladder. Normal wall thickness 2.4 mm. No pericholecystic fluid. Sonographer describes no sonographic Murphy's sign. Common bile duct:  Normal in caliber for age, 7.37mm diameter. Liver: Homogeneous in echotexture. Mildly complex 3.3 cm cyst in the left lobe. No intrahepatic biliary ductal dilatation. Antegrade color Doppler flow signal in the main portal vein. IVC:  Negative Pancreas: Visualized segments unremarkable, portions obscured by overlying bowel gas. Spleen:  No focal lesion, craniocaudal 7.1cm in length. Right Kidney:  No mass or hydronephrosis, 12.2cm in length. Left Kidney:  No lesion or hydronephrosis, 12.2cm in length. Abdominal aorta: Native aneurysm sac 9.7 cm transverse diameter, with apparent intraluminal stent graft partially seen. IMPRESSION: 1. Cholelithiasis without other ultrasound evidence of cholecystitis or biliary obstruction. 2. 3.3 cm complex hepatic cyst, left lobe. 3. 9.7 cm native abdominal aortic aneurysm diameter, with apparent intraluminal stent graft, incompletely evaluated. Electronically Signed   By: Lucrezia Europe M.D.   On: 07/01/2018 12:59    ASSESMENT:   *    Elevated LFTs.  Steadily improving. Direct bili slightly >> than indirect.   Cholelithiasis without cholecystitis and no evidence of biliary obstruction, left lobe liver cyst on ultrasound. Acute viral hepatitis serologies all negative. Pt/inr rising but this may be the eliquis.   Suspect that ischemia/shock liver is culprit in setting of syncopal event.      *     Atrial fibrillation, chronic Eliquis not on hold. Troponins elevated. Cardiology consult in process.    *     Normocytic anemia.    *    Thrombocytopenia.  *    9.7 cm AAA, status post stent graft repair 02/2018.   PLAN   *  Pt/inr, cmet, CBC in AM.        Ernest Parker  07/02/2018, 11:36 AM Phone [phone]

## 2018-07-03 ENCOUNTER — Inpatient Hospital Stay (HOSPITAL_COMMUNITY): Payer: Medicare Other

## 2018-07-03 DIAGNOSIS — I4819 Other persistent atrial fibrillation: Secondary | ICD-10-CM

## 2018-07-03 DIAGNOSIS — N179 Acute kidney failure, unspecified: Secondary | ICD-10-CM

## 2018-07-03 DIAGNOSIS — N183 Chronic kidney disease, stage 3 (moderate): Secondary | ICD-10-CM

## 2018-07-03 DIAGNOSIS — R945 Abnormal results of liver function studies: Secondary | ICD-10-CM

## 2018-07-03 LAB — GLUCOSE, CAPILLARY
GLUCOSE-CAPILLARY: 102 mg/dL — AB (ref 70–99)
Glucose-Capillary: 113 mg/dL — ABNORMAL HIGH (ref 70–99)
Glucose-Capillary: 125 mg/dL — ABNORMAL HIGH (ref 70–99)
Glucose-Capillary: 125 mg/dL — ABNORMAL HIGH (ref 70–99)
Glucose-Capillary: 116 mg/dL — ABNORMAL HIGH (ref 70–99)

## 2018-07-03 LAB — CORTISOL: Cortisol, Plasma: 15.6 ug/dL

## 2018-07-03 LAB — CBC WITH DIFFERENTIAL/PLATELET
Abs Immature Granulocytes: 0.02 K/uL (ref 0.00–0.07)
Basophils Absolute: 0 K/uL (ref 0.0–0.1)
Basophils Relative: 0 %
Eosinophils Absolute: 0 K/uL (ref 0.0–0.5)
Eosinophils Relative: 1 %
HCT: 33.3 % — ABNORMAL LOW (ref 39.0–52.0)
Hemoglobin: 10.9 g/dL — ABNORMAL LOW (ref 13.0–17.0)
Immature Granulocytes: 0 %
Lymphocytes Relative: 9 %
Lymphs Abs: 0.6 K/uL — ABNORMAL LOW (ref 0.7–4.0)
MCH: 29.2 pg (ref 26.0–34.0)
MCHC: 32.7 g/dL (ref 30.0–36.0)
MCV: 89.3 fL (ref 80.0–100.0)
Monocytes Absolute: 0.5 K/uL (ref 0.1–1.0)
Monocytes Relative: 8 %
Neutro Abs: 5.4 K/uL (ref 1.7–7.7)
Neutrophils Relative %: 82 %
Platelets: 120 K/uL — ABNORMAL LOW (ref 150–400)
RBC: 3.73 MIL/uL — ABNORMAL LOW (ref 4.22–5.81)
RDW: 15.1 % (ref 11.5–15.5)
WBC: 6.5 K/uL (ref 4.0–10.5)
nRBC: 0 % (ref 0.0–0.2)

## 2018-07-03 LAB — URINALYSIS, ROUTINE W REFLEX MICROSCOPIC
Bilirubin Urine: NEGATIVE
Glucose, UA: NEGATIVE mg/dL
Ketones, ur: NEGATIVE mg/dL
Nitrite: NEGATIVE
Protein, ur: 100 mg/dL — AB
SPECIFIC GRAVITY, URINE: 1.013 (ref 1.005–1.030)
WBC, UA: >50 WBC/hpf — ABNORMAL HIGH (ref 0–5)
pH: 6 (ref 5.0–8.0)

## 2018-07-03 LAB — COMPREHENSIVE METABOLIC PANEL
ALT: 191 U/L — AB (ref 0–44)
AST: 65 U/L — ABNORMAL HIGH (ref 15–41)
Albumin: 2.3 g/dL — ABNORMAL LOW (ref 3.5–5.0)
Alkaline Phosphatase: 177 U/L — ABNORMAL HIGH (ref 38–126)
Anion gap: 9 (ref 5–15)
BUN: 30 mg/dL — ABNORMAL HIGH (ref 8–23)
CHLORIDE: 104 mmol/L (ref 98–111)
CO2: 23 mmol/L (ref 22–32)
CREATININE: 1.79 mg/dL — AB (ref 0.61–1.24)
Calcium: 8.4 mg/dL — ABNORMAL LOW (ref 8.9–10.3)
GFR calc Af Amer: 39 mL/min — ABNORMAL LOW (ref >60)
GFR, EST NON AFRICAN AMERICAN: 34 mL/min — AB (ref >60)
Glucose, Bld: 119 mg/dL — ABNORMAL HIGH (ref 70–99)
Potassium: 3.5 mmol/L (ref 3.5–5.1)
SODIUM: 136 mmol/L (ref 135–145)
Total Bilirubin: 3.2 mg/dL — ABNORMAL HIGH (ref 0.3–1.2)
Total Protein: 5.8 g/dL — ABNORMAL LOW (ref 6.5–8.1)

## 2018-07-03 LAB — APTT
APTT: 44 s — AB (ref 24–36)
aPTT: 54 seconds — ABNORMAL HIGH (ref 24–36)

## 2018-07-03 LAB — PROTIME-INR
INR: 1.11
Prothrombin Time: 14.2 s (ref 11.4–15.2)

## 2018-07-03 LAB — HEPARIN LEVEL (UNFRACTIONATED): Heparin Unfractionated: 1.14 [IU]/mL — ABNORMAL HIGH (ref 0.30–0.70)

## 2018-07-03 LAB — HEMOGLOBIN AND HEMATOCRIT, BLOOD
HCT: 32.7 % — ABNORMAL LOW (ref 39.0–52.0)
Hemoglobin: 10.7 g/dL — ABNORMAL LOW (ref 13.0–17.0)

## 2018-07-03 MED ORDER — DILTIAZEM HCL-DEXTROSE 100-5 MG/100ML-% IV SOLN (PREMIX)
5.0000 mg/h | INTRAVENOUS | Status: DC
  Administered 2018-07-03 – 2018-07-04 (×2): 5 mg/h via INTRAVENOUS
  Filled 2018-07-03 (×2): qty 100

## 2018-07-03 MED ORDER — SODIUM CHLORIDE 0.9% FLUSH
3.0000 mL | Freq: Two times a day (BID) | INTRAVENOUS | Status: DC
  Administered 2018-07-05: 3 mL via INTRAVENOUS

## 2018-07-03 MED ORDER — PANTOPRAZOLE SODIUM 40 MG IV SOLR
40.0000 mg | Freq: Two times a day (BID) | INTRAVENOUS | Status: DC
  Administered 2018-07-03 – 2018-07-04 (×2): 40 mg via INTRAVENOUS
  Filled 2018-07-03 (×2): qty 40

## 2018-07-03 MED ORDER — SODIUM CHLORIDE 0.9 % IV SOLN: 250.0000 mL | INTRAVENOUS | Status: DC | PRN

## 2018-07-03 MED ORDER — METOPROLOL TARTRATE 5 MG/5ML IV SOLN
2.5000 mg | Freq: Four times a day (QID) | INTRAVENOUS | Status: DC | PRN
  Administered 2018-07-03: 2.5 mg via INTRAVENOUS
  Filled 2018-07-03: qty 5

## 2018-07-03 MED ORDER — SODIUM CHLORIDE 0.9 % WEIGHT BASED INFUSION
1.0000 mL/kg/h | INTRAVENOUS | Status: DC
  Administered 2018-07-04: 1 mL/kg/h via INTRAVENOUS

## 2018-07-03 MED ORDER — LEVOFLOXACIN IN D5W 500 MG/100ML IV SOLN
500.0000 mg | Freq: Once | INTRAVENOUS | Status: AC
  Administered 2018-07-03: 500 mg via INTRAVENOUS
  Filled 2018-07-03: qty 100

## 2018-07-03 MED ORDER — ASPIRIN 81 MG PO CHEW
81.0000 mg | CHEWABLE_TABLET | ORAL | Status: AC
  Administered 2018-07-04: 81 mg via ORAL
  Filled 2018-07-03: qty 1

## 2018-07-03 MED ORDER — SODIUM CHLORIDE 0.9 % WEIGHT BASED INFUSION
3.0000 mL/kg/h | INTRAVENOUS | Status: DC
  Administered 2018-07-04: 3 mL/kg/h via INTRAVENOUS

## 2018-07-03 MED ORDER — DILTIAZEM HCL ER COATED BEADS 180 MG PO CP24
180.0000 mg | ORAL_CAPSULE | Freq: Every day | ORAL | Status: DC
  Administered 2018-07-03: 180 mg via ORAL
  Filled 2018-07-03: qty 1

## 2018-07-03 MED ORDER — SODIUM CHLORIDE 0.9% FLUSH: 3.0000 mL | INTRAVENOUS | Status: DC | PRN

## 2018-07-03 MED ORDER — LEVOFLOXACIN IN D5W 250 MG/50ML IV SOLN: 250.0000 mg | INTRAVENOUS | Status: DC

## 2018-07-03 MED ORDER — METOPROLOL SUCCINATE ER 25 MG PO TB24
25.0000 mg | ORAL_TABLET | Freq: Two times a day (BID) | ORAL | Status: DC
  Administered 2018-07-04 – 2018-07-11 (×15): 25 mg via ORAL
  Filled 2018-07-03 (×15): qty 1

## 2018-07-03 NOTE — Progress Notes (Signed)
GI notified of patient's emesis and appearance.  GI states that they will round on patient in the am. That the H & H, abdominal film will suffice at this time. Will follow.

## 2018-07-03 NOTE — Progress Notes (Signed)
Lamar Blinks NP paged for pt's HR in the 130's-140's sustained. Awaiting call back.

## 2018-07-03 NOTE — Progress Notes (Signed)
K.Schorr NP advised to notify cardiology on call for uncontrolled HR. Rapid Response Nurse at bedside to evaluate the patient and paged cardiologist on call for orders.  Mattix Imhof, Blanch Media, RN

## 2018-07-03 NOTE — Progress Notes (Signed)
MD , Starla Link paged regarding pt new onset of generalized shaking. Pt awake, alert.  Blood sugar 102.  V/S 148/104, pulse 131,  o2 sat 93.  CN made aware and in to eval patient. CAll out to MD, waiting return call.

## 2018-07-03 NOTE — Progress Notes (Signed)
Dr. Aundra Dubin notified of events.  Cardizem gtt ordered for rate control if EF ok.  EF on echo 55-60%. Cardizem gtt ordered.

## 2018-07-03 NOTE — Progress Notes (Signed)
Pharmacy Antibiotic Note  Ernest Parker is a 84 y.o. male admitted on 07/01/2018 with UTI.  Pharmacy has been consulted for Levaquin dosing. Noted pt with chronic indwelling foley.  **Pt with PCN allergy - unknown childhood reaction - but pt/family states it was very serious and pt has never received PCN or cephalosporins since**  Plan: Levaquin 500mg  x 1 as ordered then 250mg  IV q24h Will f/u renal function, micro data, and pt's clinical condition  Height: 5\' 10"  (177.8 cm) Weight: 214 lb 12.8 oz (97.4 kg) IBW/kg (Calculated) : 73  Temp (24hrs), Avg:98.8 F (37.1 C), Min:98 F (36.7 C), Max:99.6 F (37.6 C)  Recent Labs  Lab 07/01/18 0552 07/01/18 0710 07/02/18 0050 07/03/18 0548  WBC 14.5*  --  11.7* 6.5  CREATININE 2.12* 2.12* 2.45* 1.79*    Estimated Creatinine Clearance: 37.3 mL/min (A) (by C-G formula based on SCr of 1.79 mg/dL (H)).    Allergies  Allergen Reactions  . Penicillins Other (See Comments)    Patient can't remember reaction was told by mother was allergic as a child Has patient had a PCN reaction causing immediate rash, facial/tongue/throat swelling, SOB or lightheadedness with hypotension: Unknown Has patient had a PCN reaction causing severe rash involving mucus membranes or skin necrosis: Unknown Has patient had a PCN reaction that required hospitalization: Unknown Has patient had a PCN reaction occurring within the last 10 years: No If all of the above answers are "NO", then may pro    Antimicrobials this admission: 10/23 Levaquin >>   Microbiology results: 10/22 BCx: ngtd 10/22 UCx: Kleb pneumo   Thank you for allowing pharmacy to be a part of this patient's care.  Sherlon Handing, PharmD, BCPS Clinical pharmacist  **Pharmacist phone directory can now be found on [email or web address] ([email or web address]).  Listed under [place]. 07/03/2018 4:39 PM

## 2018-07-03 NOTE — Progress Notes (Signed)
   07/03/18 0325  MEWS Score  Resp 19  Pulse Rate (!) 135  BP 127/83  Temp 99.6 F (37.6 C)  SpO2 93 %  O2 Device Room Air  MEWS Score  MEWS RR 0  MEWS Pulse 3  MEWS Systolic 0  MEWS LOC 0  MEWS Temp 0  MEWS Score 3  MEWS Score Color Yellow  Pt's HR in the 130's, pt asymptomatic, K. Schorr NP paged, awaiting call back.

## 2018-07-03 NOTE — Progress Notes (Signed)
   07/03/18 2133  MEWS Score  Resp (!) 21  Pulse Rate (!) 146  BP (!) 96/59  Temp 98.7 F (37.1 C)  SpO2 92 %  O2 Device Room Air  MEWS Score  MEWS RR 1  MEWS Pulse 3  MEWS Systolic 1  MEWS LOC 0  MEWS Temp 0  MEWS Score 5  MEWS Score Color Red  cardizem drip initiated. Will continue to monitor.  Giavanni Zeitlin, Blanch Media, RN

## 2018-07-03 NOTE — Progress Notes (Signed)
MD made aware of liver scan results.

## 2018-07-03 NOTE — Progress Notes (Signed)
   07/03/18 1930  Vitals  Temp 100.2 F (37.9 C)  Temp Source Oral  BP 130/60  MAP (mmHg) 81  BP Location Left Arm  BP Method Automatic  Patient Position (if appropriate) Lying  Pulse Rate (!) 151  Pulse Rate Source Dinamap  Resp (!) 22  Oxygen Therapy  SpO2 92 %  O2 Device Room Air  Pt's HR in the 150's sustained, pt diaphoretic. Lamar Blinks NP paged, awaiting call back.

## 2018-07-03 NOTE — Progress Notes (Addendum)
Progress Note  Patient Name: Ernest Parker Date of Encounter: 07/03/2018  Primary Cardiologist: [name], MD   Subjective   Feeling mildly fatigued but no other complaints. Unaware of racing heart beat. No complaints of chest pain or SOB.   Inpatient Medications    Scheduled Meds: . docusate sodium  100 mg Oral BID  . insulin aspart  0-9 Units Subcutaneous TID WC  . metoprolol succinate  12.5 mg Oral BID  . sodium chloride flush  3 mL Intravenous Q12H  . tamsulosin  0.4 mg Oral BID   Continuous Infusions: . heparin 1,500 Units/hr (07/03/18 0755)  . lactated ringers 75 mL/hr at 07/03/18 0620   PRN Meds: acetaminophen **OR** acetaminophen, morphine injection, ondansetron **OR** ondansetron (ZOFRAN) IV   Vital Signs    Vitals:   07/03/18 0325 07/03/18 0601 07/03/18 0608 07/03/18 0614  BP: 127/83  123/68   Pulse: (!) 135 (!) 118 (!) 129   Resp: 19  19   Temp: 99.6 F (37.6 C)  99.5 F (37.5 C)   TempSrc: Oral  Oral   SpO2: 93%  94%   Weight:    97.4 kg  Height:        Intake/Output Summary (Last 24 hours) at 07/03/2018 4742 Last data filed at 07/03/2018 5956 Gross per 24 hour  Intake 1263.46 ml  Output 2075 ml  Net -811.54 ml   Filed Weights   07/01/18 1358 07/02/18 0203 07/03/18 0614  Weight: 97.2 kg 97.3 kg 97.4 kg    Telemetry    Atrial fibrillation/flutter with rates up to 150s this morning - Personally Reviewed  Physical Exam   GEN: Laying in bed in no acute distress.   Neck: No JVD, no carotid bruits Cardiac: IRIR, no murmurs, rubs, or gallops.  Respiratory: Clear to auscultation bilaterally, no wheezes/ rales/ rhonchi GI: NABS, Soft, nontender, non-distended  MS: No edema; No deformity. Neuro:  Nonfocal, moving all extremities spontaneously Psych: Normal affect   Labs    Chemistry Recent Labs  Lab 07/01/18 0710 07/01/18 1647 07/02/18 0050 07/03/18 0548  NA 135  --  135 136  K 3.9  --  3.3* 3.5  CL 104  --  106 104  CO2 19*   --  22 23  GLUCOSE 180*  --  161* 119*  BUN 25*  --  39* 30*  CREATININE 2.12*  --  2.45* 1.79*  CALCIUM 8.4*  --  8.5* 8.4*  PROT 6.5 6.6 6.0* 5.8*  ALBUMIN 2.9* 2.9* 2.7* 2.3*  AST 398* 263* 184* 65*  ALT 537* 453* 345* 191*  ALKPHOS 273* 278* 219* 177*  BILITOT 5.1* 6.1* 4.8* 3.2*  GFRNONAA 27*  --  23* 34*  GFRAA 32*  --  27* 39*  ANIONGAP 12  --  7 9     Hematology Recent Labs  Lab 07/01/18 0552 07/02/18 0050 07/03/18 0548  WBC 14.5* 11.7* 6.5  RBC 4.09* 3.64* 3.73*  HGB 11.8* 10.4* 10.9*  HCT 37.0* 32.7* 33.3*  MCV 90.5 89.8 89.3  MCH 28.9 28.6 29.2  MCHC 31.9 31.8 32.7  RDW 14.8 15.4 15.1  PLT 182 127* 120*    Cardiac Enzymes Recent Labs  Lab 07/02/18 0050 07/02/18 0748 07/02/18 1435 07/02/18 1903  TROPONINI 0.93* 0.52* 0.37* 0.30*   No results for input(s): TROPIPOC in the last 168 hours.   BNPNo results for input(s): BNP, PROBNP in the last 168 hours.   DDimer No results for input(s): DDIMER in the last  168 hours.   Radiology    US Abdomen Complete  Result Date: 07/01/2018 CLINICAL DATA:  Elevated LFTs EXAM: COMPLETE ABDOMINAL ULTRASOUND COMPARISON:  None available FINDINGS: Gallbladder: Intraluminal sludge and multiple calculi measuring up to 1.1 cm in the gallbladder. Normal wall thickness 2.4 mm. No pericholecystic fluid. Sonographer describes no sonographic Murphy's sign. Common bile duct:  Normal in caliber for age, 7.42mm diameter. Liver: Homogeneous in echotexture. Mildly complex 3.3 cm cyst in the left lobe. No intrahepatic biliary ductal dilatation. Antegrade color Doppler flow signal in the main portal vein. IVC:  Negative Pancreas: Visualized segments unremarkable, portions obscured by overlying bowel gas. Spleen:  No focal lesion, craniocaudal 7.1cm in length. Right Kidney:  No mass or hydronephrosis, 12.2cm in length. Left Kidney:  No lesion or hydronephrosis, 12.2cm in length. Abdominal aorta: Native aneurysm sac 9.7 cm transverse diameter,  with apparent intraluminal stent graft partially seen. IMPRESSION: 1. Cholelithiasis without other ultrasound evidence of cholecystitis or biliary obstruction. 2. 3.3 cm complex hepatic cyst, left lobe. 3. 9.7 cm native abdominal aortic aneurysm diameter, with apparent intraluminal stent graft, incompletely evaluated. Electronically Signed   By: Lucrezia Europe M.D.   On: 07/01/2018 12:59    Cardiac Studies   Echocardiogram 07/02/18: Study Conclusions  - Left ventricle: The cavity size was normal. There was moderate   concentric hypertrophy. Systolic function was normal. The   estimated ejection fraction was in the range of 55% to 60%. Wall   motion was normal; there were no regional wall motion   abnormalities. - Aortic valve: There was trivial regurgitation. - Mitral valve: Mildly calcified annulus. There was mild   regurgitation. - Left atrium: The atrium was moderately dilated.  Patient Profile     84 y.o. male with a PMH of atrial flutter, SSS s/p PPM, HTN, HLD, AAA s/p stent graft 02/2018 in [place], prostate CA s/p radiation with chronic indwelling foley, and GERD, who is being followed by cardiology for the evaluation of syncope and elevated troponins.   Assessment & Plan    1. Syncope and elevated troponins: presentation sounds c/w orthostasis given poor po intake the day before. No complaints of chest pain. EKG with atrial paced rhythm. Trop 0.1>0.89>0.93>0.52. Echo with EF 55-60%, no wall motion abnormalities, moderate LVH. Cr has improved to 1.79 today, near baseline of 1.6. He was started on a heparin gtt yesterday in anticipation of possible ischemic evaluation.  - Will plan for [facility] tomorrow to evaluate coronary anatomy if Cr remains stable - NPO after MN - Continue IVFs today  2. Atrial flutter/fibrillation: RVR noted on telemetry this morning. Home diltiazem held and metoprolol dose reduced on admission due to hypotension. - Will restart diltiazem at this time - If HR  persistently elevated this afternoon, will consider increasing metoprolol if BP will allow - Continue heparin gtt for now - anticipate resuming home eliquis post LHC  3. HTN: BP stable.  - Will restart home diltiazem today given tachycardia - Continue metoprolol at current dose for now  4. Transaminitis: continues to improve. Felt to be 2/2 transient hypoperfusion of the liver in the setting of syncope - Continue management per primary team  For questions or updates, please contact [place] Please consult [email or web address] for contact info under Cardiology/STEMI.      Signed, Abigail Butts, PA-C  07/03/2018, 9:37 AM   [phone]  Personally seen and examined. Agree with above.  Feeling a little bit better.  Mainly fatigue.  No chest pain, no shortness of  breath. Exam: Irregularly irregular rhythm, mildly tachycardic.  General alert and oriented x3, neurologic nonfocal, abdomen soft nontender, no extremity edema.  Indwelling Foley, chronic Lab work unremarkable.  See above. Echocardiogram-EF 60%.  Assessment and plan:  Syncope with elevated troponins/orthostatic hypotension - His syncopal episode most likely from is from decreased intravascular volume, possible viral syndrome.  Improved with hydration however his troponin did increase fairly significantly to 0.9 down up from 0.1.  While this may represent a type II myocardial infarction or demand ischemia, I am concerned.  We need further evaluation with cardiac catheterization tomorrow.  His baseline creatinine is approximately 1.6, currently today 1.79.  Heparin drip.  Chronic indwelling Foley infection has been ruled out by primary team. - Blood pressure drop to 60 from 103 when standing up on presentation.  Cortisol level 15.6.  Atrial flutter/fibrillation - Restarting the diltiazem, metoprolol also at home.  These were held initially because of his syncopal episode and potential orthostatic hypotension. - Anticipate  restarting Eliquis after heart cath.  Elevated LFTs - Possibly hypoperfusion.  GI is seen.  Improving.  We will continue to follow.  Candee Furbish, MD

## 2018-07-03 NOTE — Progress Notes (Signed)
While rounding, I was asked to visit Ernest. Ernest Parker by Ernest Schultze RN due to N/V and persistent high HR 150s-160 (afib).  Ernest Parker is having approximately 50 cc of brown coffee ground emesis frequently since this evening.  He received prn zofran for nausea but is still having N/V.  He is diaphoretic. Pt is on po Toprol and need IV meds until nausea is controlled.  I notified Ernest Parker regarding po toprol because Ernest Parker ordered the toprol.  She asked to page Cardiology regarding meds and HR control.  Will contact on call Cardiologist for orders.

## 2018-07-03 NOTE — Progress Notes (Signed)
Patient admits to having the same tremors at home.  MD in to check on patient and new orders noted.   Rapid Response RN on unit to eval patient.  Urine obtained, Blood cultures obtained. Prior to starting IV abx.  IV metoprolol gilven as ordered.  HR and B/P continue to be elevated and sporadic

## 2018-07-03 NOTE — Progress Notes (Signed)
ANTICOAGULATION CONSULT NOTE - Follow Up Consult  Pharmacy Consult for heparin Indication: ACS and Aflutter   Heparin dosing wt: 93kg  Labs: Recent Labs    07/01/18 0552 07/01/18 0710  07/01/18 1647 07/01/18 2059 07/02/18 0050 07/02/18 0748 07/02/18 1435 07/02/18 1903 07/03/18 0548 07/03/18 1528  HGB 11.8*  --   --   --   --  10.4*  --   --   --  10.9*  --   HCT 37.0*  --   --   --   --  32.7*  --   --   --  33.3*  --   PLT 182  --   --   --   --  127*  --   --   --  120*  --   APTT  --   --   --   --   --   --   --   --   --  44* 54*  LABPROT  --   --   --  14.4  --  17.3*  --   --   --  14.2  --   INR  --   --   --  1.13  --  1.43  --   --   --  1.11  --   HEPARINUNFRC  --   --   --   --   --   --   --   --   --  1.14*  --   CREATININE 2.12* 2.12*  --   --   --  2.45*  --   --   --  1.79*  --   CKTOTAL  --   --   --   --  196  --   --   --   --   --   --   TROPONINI  --  0.10*   < >  --   --  0.93* 0.52* 0.37* 0.30*  --   --    < > = values in this interval not displayed.    Assessment: 84yo male with syncope and elevated troponin on heparin for r/o ACS. He is also noted with PMH of aflutter on apixaban at home (last dose was during the current admission on 07/02/2018 at 10/22 at ~ 10am) -aPTT= 54 on 1500 units/hr  Goal of Therapy:  aPTT 66-102 seconds   Plan:  -Increase heparin to 1700 units/hr -daily heparin level, aPTT and CBC  Hildred Laser, PharmD Clinical Pharmacist Please check Amion for pharmacy contact number

## 2018-07-03 NOTE — Progress Notes (Signed)
Patient has had emesis x 1 approx 150cc of brown,stool smelling emesis.  The emesis on his blanket looks burgundy, wine colored in nature.  MD notified.

## 2018-07-03 NOTE — Progress Notes (Signed)
ANTICOAGULATION CONSULT NOTE - Follow Up Consult  Pharmacy Consult for heparin Indication: ACS and Aflutter  Labs: Recent Labs    07/01/18 0552 07/01/18 0710  07/01/18 1647 07/01/18 2059 07/02/18 0050 07/02/18 0748 07/02/18 1435 07/02/18 1903 07/03/18 0548  HGB 11.8*  --   --   --   --  10.4*  --   --   --  10.9*  HCT 37.0*  --   --   --   --  32.7*  --   --   --  33.3*  PLT 182  --   --   --   --  127*  --   --   --  120*  APTT  --   --   --   --   --   --   --   --   --  44*  LABPROT  --   --   --  14.4  --  17.3*  --   --   --  14.2  INR  --   --   --  1.13  --  1.43  --   --   --  1.11  HEPARINUNFRC  --   --   --   --   --   --   --   --   --  1.14*  CREATININE 2.12* 2.12*  --   --   --  2.45*  --   --   --  1.79*  CKTOTAL  --   --   --   --  196  --   --   --   --   --   TROPONINI  --  0.10*   < >  --   --  0.93* 0.52* 0.37* 0.30*  --    < > = values in this interval not displayed.    Assessment: 84yo male subtherapeutic on heparin with initial dosing while Eliquis on hold.  Goal of Therapy:  aPTT 66-102 seconds   Plan:  Will increase heparin gtt by 3 units/kg/hr to 1500 units/hr and check PTT in 8 hours.    Wynona Neat, PharmD, BCPS  07/03/2018,7:42 AM

## 2018-07-03 NOTE — Progress Notes (Signed)
Indwelling foley cath changed per MD request. Sterile technique maintained with 2nd person witness.  Return of yellow urine in tubing. Pt tolerated procedure well.

## 2018-07-03 NOTE — Progress Notes (Signed)
Patient is A-fib. MD made aware of rate.

## 2018-07-03 NOTE — Progress Notes (Signed)
Patient ID: Ernest Parker, male   DOB: 01/05/1934, 84 y.o.   MRN: [id number]  PROGRESS NOTE    JACSON RAPAPORT  UMP:[id number] DOB: 05-10-1934 DOA: 07/01/2018 PCP: Seward Carol, MD   Brief Narrative:  84 year old male with history of A. fib on Eliquis, status post pacemaker in 09/2017, remote prostate cancer, hypertension, hyperlipidemia presented with  unresponsiveness.  CT of the head was negative for bleed.  Troponin was mildly elevated.  Cardiology was consulted.  He was started on IV fluids for AKI.  LFTs were mildly elevated.  GI was consulted.   Assessment & Plan:   Principal Problem:   Syncope and collapse Active Problems:   A-fib (HCC)   Hypertension   History of prostate cancer   Elevated LFTs   CKD (chronic kidney disease) stage 3, GFR 30-59 ml/min (HCC)   Syncope with elevated troponins -Unclear etiology.  Had prolonged confusion for 20 to 30 minutes -Might be related to orthostatic hypotension: Lasix on hold.  Continue IV fluids -Echo showed EF of 55 to 60% -Cardiology planning for cardiac catheterization in a.m.  Continue heparin drip.  Paroxysmal atrial fibrillation/flutter; bradycardia status post pacemaker in 1/19 -Currently tachycardic. -Beta-blocker and calcium channel blocker was held on admission because of borderline BP. -Cardizem and beta-blocker are being restarted by cardiology.  Monitor.  On heparin drip instead of Eliquis for now.  Elevated LFTs -Probably from hypoperfusion.  Improving. -Right upper quadrant ultrasound showed cholelithiasis without cholecystitis  Acute kidney injury on chronic kidney disease stage III -Improving.  Creatinine is 1.79 today.  Baseline creatinine is around 1.7 -Lasix on hold.  Repeat a.m. creatinine.  Continue IV fluids  Chronic urinary retention with chronic indwelling Foley's catheter -Outpatient follow-up with urology.  Urology had recommended to replace Foley's catheter for today.  Will notify the nursing staff for  the same.  If there is difficulty with the same, urology can be called by the nursing staff.   DVT prophylaxis: Heparin Code Status: DNR Family Communication: Daughter at bedside Disposition Plan: Home after cleared by cardiology  Consultants: Cardiology/GI  Procedures:  Echo Study Conclusions  - Left ventricle: The cavity size was normal. There was moderate   concentric hypertrophy. Systolic function was normal. The   estimated ejection fraction was in the range of 55% to 60%. Wall   motion was normal; there were no regional wall motion   abnormalities. - Aortic valve: There was trivial regurgitation. - Mitral valve: Mildly calcified annulus. There was mild   regurgitation. - Left atrium: The atrium was moderately dilated.  Antimicrobials: None   Subjective: Patient seen and examined at bedside.  He feels weak but slightly better.  No overnight fever, nausea or vomiting.  Objective: Vitals:   07/03/18 0325 07/03/18 0601 07/03/18 0608 07/03/18 0614  BP: 127/83  123/68   Pulse: (!) 135 (!) 118 (!) 129   Resp: 19  19   Temp: 99.6 F (37.6 C)  99.5 F (37.5 C)   TempSrc: Oral  Oral   SpO2: 93%  94%   Weight:    97.4 kg  Height:        Intake/Output Summary (Last 24 hours) at 07/03/2018 1243 Last data filed at 07/03/2018 0620 Gross per 24 hour  Intake 1263.46 ml  Output 2075 ml  Net -811.54 ml   Filed Weights   07/01/18 1358 07/02/18 0203 07/03/18 0614  Weight: 97.2 kg 97.3 kg 97.4 kg    Examination:  General exam: Appears calm and comfortable  Respiratory system: Bilateral decreased breath sounds at bases Cardiovascular system: S1 & S2 heard, tachycardic Gastrointestinal system: Abdomen is nondistended, soft and nontender. Normal bowel sounds heard. Extremities: No cyanosis, clubbing, edema   Data Reviewed: I have personally reviewed following labs and imaging studies  CBC: Recent Labs  Lab 07/01/18 0552 07/02/18 0050 07/03/18 0548  WBC 14.5*  11.7* 6.5  NEUTROABS  --   --  5.4  HGB 11.8* 10.4* 10.9*  HCT 37.0* 32.7* 33.3*  MCV 90.5 89.8 89.3  PLT 182 127* 397*   Basic Metabolic Panel: Recent Labs  Lab 07/01/18 0552 07/01/18 0710 07/02/18 0050 07/03/18 0548  NA 136 135 135 136  K 3.9 3.9 3.3* 3.5  CL 105 104 106 104  CO2 20* 19* 22 23  GLUCOSE 186* 180* 161* 119*  BUN 28* 25* 39* 30*  CREATININE 2.12* 2.12* 2.45* 1.79*  CALCIUM 8.3* 8.4* 8.5* 8.4*   GFR: Estimated Creatinine Clearance: 37.3 mL/min (A) (by C-G formula based on SCr of 1.79 mg/dL (H)). Liver Function Tests: Recent Labs  Lab 07/01/18 0710 07/01/18 1647 07/02/18 0050 07/03/18 0548  AST 398* 263* 184* 65*  ALT 537* 453* 345* 191*  ALKPHOS 273* 278* 219* 177*  BILITOT 5.1* 6.1* 4.8* 3.2*  PROT 6.5 6.6 6.0* 5.8*  ALBUMIN 2.9* 2.9* 2.7* 2.3*   No results for input(s): LIPASE, AMYLASE in the last 168 hours. Recent Labs  Lab 07/01/18 1445 07/01/18 2059  AMMONIA 59* 41*   Coagulation Profile: Recent Labs  Lab 07/01/18 1647 07/02/18 0050 07/03/18 0548  INR 1.13 1.43 1.11   Cardiac Enzymes: Recent Labs  Lab 07/01/18 1445 07/01/18 2059 07/02/18 0050 07/02/18 0748 07/02/18 1435 07/02/18 1903  CKTOTAL  --  196  --   --   --   --   TROPONINI 0.89*  --  0.93* 0.52* 0.37* 0.30*   BNP (last 3 results) No results for input(s): PROBNP in the last 8760 hours. HbA1C: Recent Labs    07/01/18 1445  HGBA1C 5.7*   CBG: Recent Labs  Lab 07/02/18 1138 07/02/18 1635 07/02/18 2205 07/03/18 0747 07/03/18 1152  GLUCAP 105* 97 119* 113* 125*   Lipid Profile: No results for input(s): CHOL, HDL, LDLCALC, TRIG, CHOLHDL, LDLDIRECT in the last 72 hours. Thyroid Function Tests: Recent Labs    07/01/18 1445  TSH 0.897   Anemia Panel: No results for input(s): VITAMINB12, FOLATE, FERRITIN, TIBC, IRON, RETICCTPCT in the last 72 hours. Sepsis Labs: No results for input(s): PROCALCITON, LATICACIDVEN in the last 168 hours.  Recent Results  (from the past 240 hour(s))  Culture, Urine     Status: Abnormal (Preliminary result)   Collection Time: 07/02/18  7:37 AM  Result Value Ref Range Status   Specimen Description URINE, RANDOM  Final   Special Requests NONE  Final   Culture (A)  Final    >=100,000 COLONIES/mL KLEBSIELLA PNEUMONIAE SUSCEPTIBILITIES TO FOLLOW Performed at [facility], [address] [place], [place] [address]    Report Status PENDING  Incomplete  Culture, blood (routine x 2)     Status: None (Preliminary result)   Collection Time: 07/02/18 10:45 PM  Result Value Ref Range Status   Specimen Description BLOOD LEFT ANTECUBITAL  Final   Special Requests   Final    BOTTLES DRAWN AEROBIC ONLY Blood Culture results may not be optimal due to an inadequate volume of blood received in culture bottles   Culture   Final    NO GROWTH <  12 HOURS Performed at [facility], [place] [address], [place], [place] [address]    Report Status PENDING  Incomplete  Culture, blood (routine x 2)     Status: None (Preliminary result)   Collection Time: 07/02/18 10:51 PM  Result Value Ref Range Status   Specimen Description BLOOD LEFT FOREARM  Final   Special Requests   Final    BOTTLES DRAWN AEROBIC AND ANAEROBIC Blood Culture results may not be optimal due to an inadequate volume of blood received in culture bottles   Culture   Final    NO GROWTH < 12 HOURS Performed at [facility], [place] [address] [place],  [address]    Report Status PENDING  Incomplete         Radiology Studies: US Abdomen Complete  Addendum Date: 07/03/2018   ADDENDUM REPORT: 07/03/2018 12:11 ADDENDUM: On further review, the gallbladder contents are markedly heterogeneous with possible soft tissue component versus tumefactive sludge, and echogenic shadowing areas of gas versus calcification. If gallbladder carcinoma or emphysematous cholecystitis are clinical concerns, consider CT abdomen with contrast for further evaluation.  Electronically Signed   By: Lucrezia Europe M.D.   On: 07/03/2018 12:11   Result Date: 07/03/2018 CLINICAL DATA:  Elevated LFTs EXAM: COMPLETE ABDOMINAL ULTRASOUND COMPARISON:  None available FINDINGS: Gallbladder: Intraluminal sludge and multiple calculi measuring up to 1.1 cm in the gallbladder. Normal wall thickness 2.4 mm. No pericholecystic fluid. Sonographer describes no sonographic Murphy's sign. Common bile duct:  Normal in caliber for age, 7.38mm diameter. Liver: Homogeneous in echotexture. Mildly complex 3.3 cm cyst in the left lobe. No intrahepatic biliary ductal dilatation. Antegrade color Doppler flow signal in the main portal vein. IVC:  Negative Pancreas: Visualized segments unremarkable, portions obscured by overlying bowel gas. Spleen:  No focal lesion, craniocaudal 7.1cm in length. Right Kidney:  No mass or hydronephrosis, 12.2cm in length. Left Kidney:  No lesion or hydronephrosis, 12.2cm in length. Abdominal aorta: Native aneurysm sac 9.7 cm transverse diameter, with apparent intraluminal stent graft partially seen. IMPRESSION: 1. Cholelithiasis without other ultrasound evidence of cholecystitis or biliary obstruction. 2. 3.3 cm complex hepatic cyst, left lobe. 3. 9.7 cm native abdominal aortic aneurysm diameter, with apparent intraluminal stent graft, incompletely evaluated. Electronically Signed: By: Lucrezia Europe M.D. On: 07/01/2018 12:59   US Liver Doppler  Result Date: 07/03/2018 CLINICAL DATA:  Elevated LFTs. History of syncope and elevated troponins. EXAM: DUPLEX ULTRASOUND OF LIVER TECHNIQUE: Color and duplex Doppler ultrasound was performed to evaluate the hepatic in-flow and out-flow vessels. COMPARISON:  Abdominal ultrasound 07/01/2018 FINDINGS: Liver: Liver contour adjacent to the right kidney is slightly nodular. Again noted is a mildly complex hepatic cyst measuring roughly 3.1 cm. Liver parenchyma is mildly heterogeneous. Portal Vein Velocities Main:  41 cm/sec Right:  25 cm/sec  Left:  22 cm/sec Hepatic Vein Velocities Right:  47 cm/sec Middle:  47 cm/sec Left:  44 cm/sec IVC: Present and patent with normal respiratory phasicity. Hepatic Artery Velocity:  140 cm/sec Splenic Vein Velocity:  46 cm/sec Varices: Absent Ascites: Absent Again noted is an abnormal gallbladder. Gallbladder has echogenic foci with areas of shadowing. Although the findings could be related to irregular gallstones, the appearance raises concern for underlying inflammation or neoplasm. Portal vein demonstrates normal hepatopetal flow towards the liver. Main portal vein measures roughly 1.2 cm. Spleen size is upper limits of normal with a length of 10.4 cm. Normal hepatofugal flow in the hepatic veins. Splenic vein is patent. Probable  splenule near the splenic hilum measuring up to 2.4 cm. IMPRESSION: 1. Portal venous system is patent with normal direction of flow. 2. Abnormal gallbladder. Suspect there are stones or calcifications but recommend further characterization with a post contrast CT to further characterize the gallbladder for an inflammatory or neoplastic process. 3. Nodular contour of the liver and findings could be related to cirrhosis. This finding could also be further characterized with cross-sectional imaging. Spleen size is upper limits of normal. These results will be called to the ordering clinician or representative by the Radiologist Assistant, and communication documented in the PACS or zVision Dashboard. Electronically Signed   By: [name] Daft M.D.   On: 07/03/2018 12:26        Scheduled Meds: . diltiazem  180 mg Oral Daily  . docusate sodium  100 mg Oral BID  . insulin aspart  0-9 Units Subcutaneous TID WC  . metoprolol succinate  12.5 mg Oral BID  . sodium chloride flush  3 mL Intravenous Q12H  . tamsulosin  0.4 mg Oral BID   Continuous Infusions: . heparin 1,500 Units/hr (07/03/18 0755)  . lactated ringers 75 mL/hr at 07/03/18 0620     LOS: 1 day        Aline August,  MD [facility] Pager [phone]  If 7PM-7AM, please contact night-coverage [email or web address] Password [facility] 07/03/2018, 12:43 PM

## 2018-07-04 ENCOUNTER — Encounter (HOSPITAL_COMMUNITY)
Admission: EM | Disposition: A | Discharge status: Home / Self Care | Payer: Self-pay | Source: Home / Self Care | Attending: Internal Medicine

## 2018-07-04 ENCOUNTER — Encounter (HOSPITAL_COMMUNITY): Payer: Self-pay | Admitting: Physician Assistant

## 2018-07-04 DIAGNOSIS — N390 Urinary tract infection, site not specified: Secondary | ICD-10-CM

## 2018-07-04 DIAGNOSIS — R7989 Other specified abnormal findings of blood chemistry: Secondary | ICD-10-CM

## 2018-07-04 DIAGNOSIS — I480 Paroxysmal atrial fibrillation: Secondary | ICD-10-CM | POA: Diagnosis present

## 2018-07-04 DIAGNOSIS — R778 Other specified abnormalities of plasma proteins: Secondary | ICD-10-CM | POA: Diagnosis present

## 2018-07-04 LAB — GLUCOSE, CAPILLARY
Glucose-Capillary: 138 mg/dL — ABNORMAL HIGH (ref 70–99)
Glucose-Capillary: 133 mg/dL — ABNORMAL HIGH (ref 70–99)
Glucose-Capillary: 106 mg/dL — ABNORMAL HIGH (ref 70–99)
Glucose-Capillary: 132 mg/dL — ABNORMAL HIGH (ref 70–99)

## 2018-07-04 LAB — BLOOD CULTURE ID PANEL (REFLEXED)

## 2018-07-04 LAB — COMPREHENSIVE METABOLIC PANEL
ALK PHOS: 153 U/L — AB (ref 38–126)
ALT: 123 U/L — AB (ref 0–44)
AST: 43 U/L — AB (ref 15–41)
Albumin: 2.2 g/dL — ABNORMAL LOW (ref 3.5–5.0)
Anion gap: 10 (ref 5–15)
BUN: 33 mg/dL — AB (ref 8–23)
CO2: 22 mmol/L (ref 22–32)
CREATININE: 2.14 mg/dL — AB (ref 0.61–1.24)
Calcium: 8 mg/dL — ABNORMAL LOW (ref 8.9–10.3)
Chloride: 104 mmol/L (ref 98–111)
GFR, EST AFRICAN AMERICAN: 31 mL/min — AB (ref >60)
GFR, EST NON AFRICAN AMERICAN: 27 mL/min — AB (ref >60)
Glucose, Bld: 172 mg/dL — ABNORMAL HIGH (ref 70–99)
Potassium: 4.1 mmol/L (ref 3.5–5.1)
Sodium: 136 mmol/L (ref 135–145)
Total Bilirubin: 3.2 mg/dL — ABNORMAL HIGH (ref 0.3–1.2)
Total Protein: 5.5 g/dL — ABNORMAL LOW (ref 6.5–8.1)

## 2018-07-04 LAB — CBC WITH DIFFERENTIAL/PLATELET
ABS IMMATURE GRANULOCYTES: 0.03 10*3/uL (ref 0.00–0.07)
Basophils Absolute: 0 10*3/uL (ref 0.0–0.1)
Basophils Relative: 0 %
Eosinophils Absolute: 0 10*3/uL (ref 0.0–0.5)
Eosinophils Relative: 0 %
HCT: 30.8 % — ABNORMAL LOW (ref 39.0–52.0)
HEMOGLOBIN: 9.6 g/dL — AB (ref 13.0–17.0)
IMMATURE GRANULOCYTES: 0 %
LYMPHS ABS: 0.7 10*3/uL (ref 0.7–4.0)
LYMPHS PCT: 6 %
MCH: 28.7 pg (ref 26.0–34.0)
MCHC: 31.2 g/dL (ref 30.0–36.0)
MCV: 91.9 fL (ref 80.0–100.0)
MONO ABS: 0.6 10*3/uL (ref 0.1–1.0)
MONOS PCT: 5 %
NEUTROS ABS: 10 10*3/uL — AB (ref 1.7–7.7)
NEUTROS PCT: 89 %
Platelets: 97 10*3/uL — ABNORMAL LOW (ref 150–400)
RBC: 3.35 MIL/uL — ABNORMAL LOW (ref 4.22–5.81)
RDW: 15.3 % (ref 11.5–15.5)
WBC: 11.3 10*3/uL — ABNORMAL HIGH (ref 4.0–10.5)
nRBC: 0 % (ref 0.0–0.2)

## 2018-07-04 LAB — HEMOGLOBIN AND HEMATOCRIT, BLOOD
HCT: 28 % — ABNORMAL LOW (ref 39.0–52.0)
Hemoglobin: 8.6 g/dL — ABNORMAL LOW (ref 13.0–17.0)

## 2018-07-04 LAB — MAGNESIUM: MAGNESIUM: 1.9 mg/dL (ref 1.7–2.4)

## 2018-07-04 SURGERY — LEFT HEART CATH AND CORONARY ANGIOGRAPHY
Anesthesia: LOCAL

## 2018-07-04 MED ORDER — PANTOPRAZOLE SODIUM 40 MG PO TBEC
40.0000 mg | DELAYED_RELEASE_TABLET | Freq: Two times a day (BID) | ORAL | Status: DC
  Administered 2018-07-04 – 2018-07-11 (×14): 40 mg via ORAL
  Filled 2018-07-04 (×14): qty 1

## 2018-07-04 MED ORDER — SODIUM CHLORIDE 0.9 % IV SOLN
1.0000 g | Freq: Three times a day (TID) | INTRAVENOUS | Status: DC
  Administered 2018-07-04 – 2018-07-05 (×3): 1 g via INTRAVENOUS
  Filled 2018-07-04 (×5): qty 1

## 2018-07-04 MED ORDER — SODIUM CHLORIDE 0.9 % IV SOLN
INTRAVENOUS | Status: DC
  Administered 2018-07-04 – 2018-07-06 (×3): via INTRAVENOUS

## 2018-07-04 MED ORDER — DILTIAZEM HCL ER COATED BEADS 120 MG PO CP24
120.0000 mg | ORAL_CAPSULE | Freq: Every day | ORAL | Status: DC
  Administered 2018-07-04 – 2018-07-11 (×8): 120 mg via ORAL
  Filled 2018-07-04 (×8): qty 1

## 2018-07-04 NOTE — Progress Notes (Signed)
Gastroenterology Inpatient Follow-up Note   PATIENT IDENTIFICATION  Ernest Parker is a 84 y.o. male Hospital Day: 4  SUBJECTIVE  No acute changes. Awaiting his catheterization tomorrow if kidney function stable. Liver Doppler U/S completed.   OBJECTIVE  Scheduled Inpatient Medications:  . diltiazem  120 mg Oral Daily  . docusate sodium  100 mg Oral BID  . insulin aspart  0-9 Units Subcutaneous TID WC  . metoprolol succinate  25 mg Oral BID  . pantoprazole (PROTONIX) IV  40 mg Intravenous Q12H  . sodium chloride flush  3 mL Intravenous Q12H  . sodium chloride flush  3 mL Intravenous Q12H  . tamsulosin  0.4 mg Oral BID   Continuous Inpatient Infusions:  . sodium chloride    . sodium chloride 1 mL/kg/hr (07/04/18 0525)  . aztreonam    . diltiazem (CARDIZEM) infusion 5 mg/hr (07/04/18 0600)  . lactated ringers Stopped (07/04/18 0412)   PRN Inpatient Medications: sodium chloride, acetaminophen **OR** acetaminophen, metoprolol tartrate, morphine injection, ondansetron **OR** ondansetron (ZOFRAN) IV, sodium chloride flush   Physical Examination  Temp:  [97.5 F (36.4 C)-100.2 F (37.9 C)] 97.5 F (36.4 C) (10/24 0906) Pulse Rate:  [67-151] 71 (10/24 0906) Resp:  [17-22] 17 (10/24 0116) BP: (90-165)/(54-102) 107/66 (10/24 0906) SpO2:  [92 %-99 %] 99 % (10/24 0906) FiO2 (%):  [20 %] 20 % (10/24 0700) Weight:  [99.8 kg] 99.8 kg (10/24 0522) Temp (24hrs), Avg:98.4 F (36.9 C), Min:97.5 F (36.4 C), Max:100.2 F (37.9 C)  Weight: 99.8 kg(scale b) GEN: NAD, daughter at bedside PSYCH: Cooperative EYE: Icteric sclerae ENT: MMM GI: Soft, NT/ND, without rebound MSK/EXT: BLE edema present SKIN: Jaundiced NEURO:  Alert & Oriented x 3, no focal deficits, no evidence of asterixis   Review of Data   Laboratory Studies   Recent Labs  Lab 07/04/18 0505  NA 136  K 4.1  CL 104  CO2 22  BUN 33*  CREATININE 2.14*  GLUCOSE 172*  CALCIUM 8.0*  MG 1.9   Recent Labs    Lab 07/04/18 0505  AST 43*  ALT 123*  ALKPHOS 153*    Recent Labs  Lab 07/02/18 0050 07/03/18 0548  07/04/18 0505  WBC 11.7* 6.5  --  11.3*  HGB 10.4* 10.9*   < > 9.6*  HCT 32.7* 33.3*   < > 30.8*  PLT 127* 120*  --  97*   < > = values in this interval not displayed.   Recent Labs  Lab 07/01/18 1647 07/02/18 0050  07/03/18 0548 07/03/18 1528  APTT  --   --    < > 44* 54*  INR 1.13 1.43  --  1.11  --    < > = values in this interval not displayed.   Computed MELD-Na score unavailable. Necessary lab results were not found in the last year. Computed MELD score unavailable. Necessary lab results were not found in the last year.  Imaging Studies  Dg Abd 1 View  Result Date: 07/03/2018 CLINICAL DATA:  84 year old male with vomiting and diarrhea. EXAM: ABDOMEN - 1 VIEW COMPARISON:  Chest radiographs 07/01/2018. FINDINGS: Portable AP supine view at 1850 hours. Bifurcated abdominal aortic endograft. Visualized bowel gas pattern is non obstructed. No pneumoperitoneum identified on this supine view. Small surgical clips and mesh type fast nerves project in the pelvis. No acute osseous abnormality identified. IMPRESSION: Normal visible bowel gas pattern. Abdominal aortic endograft in place. Electronically Signed   By: Herminio Heads.D.  On: 07/03/2018 19:05   US Liver Doppler  Result Date: 07/03/2018 CLINICAL DATA:  Elevated LFTs. History of syncope and elevated troponins. EXAM: DUPLEX ULTRASOUND OF LIVER TECHNIQUE: Color and duplex Doppler ultrasound was performed to evaluate the hepatic in-flow and out-flow vessels. COMPARISON:  Abdominal ultrasound 07/01/2018 FINDINGS: Liver: Liver contour adjacent to the right kidney is slightly nodular. Again noted is a mildly complex hepatic cyst measuring roughly 3.1 cm. Liver parenchyma is mildly heterogeneous. Portal Vein Velocities Main:  41 cm/sec Right:  25 cm/sec Left:  22 cm/sec Hepatic Vein Velocities Right:  47 cm/sec Middle:  47 cm/sec Left:   44 cm/sec IVC: Present and patent with normal respiratory phasicity. Hepatic Artery Velocity:  140 cm/sec Splenic Vein Velocity:  46 cm/sec Varices: Absent Ascites: Absent Again noted is an abnormal gallbladder. Gallbladder has echogenic foci with areas of shadowing. Although the findings could be related to irregular gallstones, the appearance raises concern for underlying inflammation or neoplasm. Portal vein demonstrates normal hepatopetal flow towards the liver. Main portal vein measures roughly 1.2 cm. Spleen size is upper limits of normal with a length of 10.4 cm. Normal hepatofugal flow in the hepatic veins. Splenic vein is patent. Probable splenule near the splenic hilum measuring up to 2.4 cm. IMPRESSION: 1. Portal venous system is patent with normal direction of flow. 2. Abnormal gallbladder. Suspect there are stones or calcifications but recommend further characterization with a post contrast CT to further characterize the gallbladder for an inflammatory or neoplastic process. 3. Nodular contour of the liver and findings could be related to cirrhosis. This finding could also be further characterized with cross-sectional imaging. Spleen size is upper limits of normal. These results will be called to the ordering clinician or representative by the Radiologist Assistant, and communication documented in the PACS or zVision Dashboard. Electronically Signed   By: [name] Daft M.D.   On: 07/03/2018 12:26     ASSESSMENT  [name] is a 84 y.o. male   Concern for likely ischemic hepatopathy with downtrending LFTs.  Had history of abnormal gallbladder per patient family report and told may need gallbladder removed.  Now U/S with concern for possible underlying malignant process vs sludge. Not clear he has anything in the CBD that require treatment. Will need some contrasted imaging as per Radiology. Had a CT in the summer at [facility] but cannot see records, order placed for this to be  obtained.   PLAN/RECOMMENDATIONS  Daily LFTs Need to get records from [place] When he is stable from a kidney perspective after Catheterization probably on Friday or the weekend will need to get a CT-Abdomen with IV contrast vs a MRI/MRCP with contrast (will defer exact timing to the medical service who will be monitoring his kidney function after potential catheterization on Thursday). Surgical referral may be necessary.   Please page/call with questions or concerns.  Justice Britain, MD [facility] Office # [phone]    LOS: 2 days  Irving Copas  07/04/2018, 9:54 AM

## 2018-07-04 NOTE — Progress Notes (Signed)
Patient ID: Ernest Parker, male   DOB: [birth year], 84 y.o.   MRN: [id number]  PROGRESS NOTE    Ernest Parker  XLK:[id number] DOB: 1934/07/01 DOA: 07/01/2018 PCP: Seward Carol, MD   Brief Narrative:  84 year old male with history of A. fib on Eliquis, status post pacemaker in 09/2017, remote prostate cancer, hypertension, hyperlipidemia presented with  unresponsiveness.  CT of the head was negative for bleed.  Troponin was mildly elevated.  Cardiology was consulted.  He was started on IV fluids for AKI.  LFTs were mildly elevated.  GI was consulted. He was planned for cardiac catheterization on 07/04/2018. Patient had chills on 07/03/2018 and went into A. fib with RVR.  He was started on empiric antibiotics and had to be placed on Cardizem drip.   Assessment & Plan:   Principal Problem:   Syncope and collapse Active Problems:   Atrial fibrillation with rapid ventricular response (HCC)   Essential hypertension   History of prostate cancer   Elevated LFTs   CKD (chronic kidney disease) stage 3, GFR 30-59 ml/min (HCC)   PAF (paroxysmal atrial fibrillation) (HCC)   Elevated troponin  E. coli bacteremia -Possibly from urinary tract infection.  Cannot rule out biliary source. -Urine culture is growing Klebsiella pneumoniae though.  Foley catheter was changed on 07/03/2018.  Patient had chills on 07/03/2018 and blood cultures drawn at that time is positive for E. coli.  Blood cultures on admission was negative. -We will repeat blood cultures in 24 to 48 hours to see if the bacteremia is clearing.  If there is persistent bacteremia, there might be a concern for pacemaker infection as well. -Currently on Levaquin.  Will switch to IV aztreonam till we have sensitivities available  UTI -Probably associated with chronic indwelling Foley catheter. -Probably evolving on admission.  Urine culture done at the time of admission from the indwelling Foley catheter was positive for Klebsiella pneumoniae.   Foley catheter was changed on 07/03/2018.  Repeat urine cultures from 07/03/2018 is pending  Syncope with elevated troponins -Unclear etiology.  Had prolonged confusion for 20 to 30 minutes -Might be related to orthostatic hypotension: Lasix on hold.  Continue IV fluids -Echo showed EF of 55 to 60% -Cardiology had planned for cardiac catheterization for today which will be held because of bacteremia.  Follow further cardiology recommendations  Paroxysmal atrial fibrillation/flutter with rapid ventricular rate; bradycardia status post pacemaker in 1/19 -And had to be put on Cardizem drip overnight of 07/03/2018.  Currently rate controlled.  On oral Cardizem and beta-blocker.  Follow further cardiology recommendations.  Heparin drip discontinued because of possibility of upper GI bleeding.  Eliquis held since admission  Probable upper GI bleeding -Patient had vomiting with concern for coffee-ground yesterday evening on 07/03/2018.  Heparin drip stopped.  Hemoglobin stable.  Started Protonix IV twice a day.  GI notified.  Will follow further recommendations.  Monitor H&H  Elevated LFTs -Probably from hypoperfusion.  Improving. -Right upper quadrant ultrasound showed cholelithiasis without cholecystitis  Abnormal gallbladder -Ultrasound showed possible underlying malignant process versus sludge.  Patient will require CT abdomen with IV contrast versus MRI/MRCP with contrast.  This cannot be done at this time because of renal function.  Will wait for renal function to improve prior to proceeding with imaging studies.  Acute kidney injury on chronic kidney disease stage III -Creatinine is slightly worse at 2.14 today.  Baseline creatinine is around 1.7 -Lasix on hold.  Repeat a.m. creatinine.  Increase normal saline to 100  cc an hour  Chronic urinary retention with chronic indwelling Foley's catheter -Outpatient follow-up with urology.  Urology had recommended to replace Foley's catheter while  inpatient.  Foley catheter was changed on 06/30/2018.  Thrombocytopenia -Questionable cause.  Monitor  DVT prophylaxis: Heparin discontinued because of probable upper GI bleeding Code Status: DNR Family Communication: Daughter and son-in-law at bedside Disposition Plan: Depends on clinical outcome  Consultants: Cardiology/GI  Procedures:  Echo Study Conclusions  - Left ventricle: The cavity size was normal. There was moderate   concentric hypertrophy. Systolic function was normal. The   estimated ejection fraction was in the range of 55% to 60%. Wall   motion was normal; there were no regional wall motion   abnormalities. - Aortic valve: There was trivial regurgitation. - Mitral valve: Mildly calcified annulus. There was mild   regurgitation. - Left atrium: The atrium was moderately dilated.  Antimicrobials: None   Subjective: Patient seen and examined at bedside.  Patient denies any current nausea or vomiting or chest pain.  He had nausea and vomiting yesterday evening.  Denies any worsening shortness of breath.  Patient had chills yesterday. Objective: Vitals:   07/04/18 0249 07/04/18 0522 07/04/18 0700 07/04/18 0906  BP: 90/60 100/62 92/64 107/66  Pulse: 67 73 75 71  Resp:      Temp:  (!) 97.5 F (36.4 C) (!) 97.5 F (36.4 C) (!) 97.5 F (36.4 C)  TempSrc:  Oral Oral Oral  SpO2: 98% 97% 97% 99%  Weight:  99.8 kg    Height:        Intake/Output Summary (Last 24 hours) at 07/04/2018 1015 Last data filed at 07/04/2018 1000 Gross per 24 hour  Intake 2073.63 ml  Output 1101 ml  Net 972.63 ml   Filed Weights   07/02/18 0203 07/03/18 0614 07/04/18 0522  Weight: 97.3 kg 97.4 kg 99.8 kg    Examination:  General exam: Appears calm and comfortable, no distress Respiratory system: Bilateral decreased breath sounds at bases, with scattered crackles Cardiovascular system: S1 & S2 heard, rate controlled currently  gastrointestinal system: Abdomen is nondistended,  soft and nontender. Normal bowel sounds heard. CNS: Alert and awake, no focal neurologic deficit.  Moving extremities Skin: No rash or petechiae Extremities: No cyanosis, clubbing, edema  Psych: Normal mood and judgment Lymph: No cervical lymphadenopathy  Data Reviewed: I have personally reviewed following labs and imaging studies  CBC: Recent Labs  Lab 07/01/18 0552 07/02/18 0050 07/03/18 0548 07/03/18 1958 07/04/18 0505  WBC 14.5* 11.7* 6.5  --  11.3*  NEUTROABS  --   --  5.4  --  10.0*  HGB 11.8* 10.4* 10.9* 10.7* 9.6*  HCT 37.0* 32.7* 33.3* 32.7* 30.8*  MCV 90.5 89.8 89.3  --  91.9  PLT 182 127* 120*  --  97*   Basic Metabolic Panel: Recent Labs  Lab 07/01/18 0552 07/01/18 0710 07/02/18 0050 07/03/18 0548 07/04/18 0505  NA 136 135 135 136 136  K 3.9 3.9 3.3* 3.5 4.1  CL 105 104 106 104 104  CO2 20* 19* 22 23 22   GLUCOSE 186* 180* 161* 119* 172*  BUN 28* 25* 39* 30* 33*  CREATININE 2.12* 2.12* 2.45* 1.79* 2.14*  CALCIUM 8.3* 8.4* 8.5* 8.4* 8.0*  MG  --   --   --   --  1.9   GFR: Estimated Creatinine Clearance: 31.5 mL/min (A) (by C-G formula based on SCr of 2.14 mg/dL (H)). Liver Function Tests: Recent Labs  Lab 07/01/18 0710 07/01/18  1647 07/02/18 0050 07/03/18 0548 07/04/18 0505  AST 398* 263* 184* 65* 43*  ALT 537* 453* 345* 191* 123*  ALKPHOS 273* 278* 219* 177* 153*  BILITOT 5.1* 6.1* 4.8* 3.2* 3.2*  PROT 6.5 6.6 6.0* 5.8* 5.5*  ALBUMIN 2.9* 2.9* 2.7* 2.3* 2.2*   No results for input(s): LIPASE, AMYLASE in the last 168 hours. Recent Labs  Lab 07/01/18 1445 07/01/18 2059  AMMONIA 59* 41*   Coagulation Profile: Recent Labs  Lab 07/01/18 1647 07/02/18 0050 07/03/18 0548  INR 1.13 1.43 1.11   Cardiac Enzymes: Recent Labs  Lab 07/01/18 1445 07/01/18 2059 07/02/18 0050 07/02/18 0748 07/02/18 1435 07/02/18 1903  CKTOTAL  --  196  --   --   --   --   TROPONINI 0.89*  --  0.93* 0.52* 0.37* 0.30*   BNP (last 3 results) No results  for input(s): PROBNP in the last 8760 hours. HbA1C: Recent Labs    07/01/18 1445  HGBA1C 5.7*   CBG: Recent Labs  Lab 07/03/18 1152 07/03/18 1613 07/03/18 1940 07/03/18 2105 07/04/18 0750  GLUCAP 125* 102* 125* 116* 138*   Lipid Profile: No results for input(s): CHOL, HDL, LDLCALC, TRIG, CHOLHDL, LDLDIRECT in the last 72 hours. Thyroid Function Tests: Recent Labs    07/01/18 1445  TSH 0.897   Anemia Panel: No results for input(s): VITAMINB12, FOLATE, FERRITIN, TIBC, IRON, RETICCTPCT in the last 72 hours. Sepsis Labs: No results for input(s): PROCALCITON, LATICACIDVEN in the last 168 hours.  Recent Results (from the past 240 hour(s))  Culture, Urine     Status: Abnormal (Preliminary result)   Collection Time: 07/02/18  7:37 AM  Result Value Ref Range Status   Specimen Description URINE, RANDOM  Final   Special Requests NONE  Final   Culture (A)  Final    >=100,000 COLONIES/mL KLEBSIELLA PNEUMONIAE 40,000 COLONIES/mL UNIDENTIFIED ORGANISM IDENTIFICATION AND SUSCEPTIBILITIES TO FOLLOW Performed at [facility], [address], [place], [place] [address]    Report Status PENDING  Incomplete   Organism ID, Bacteria KLEBSIELLA PNEUMONIAE (A)  Final      Susceptibility   Klebsiella pneumoniae - MIC*    AMPICILLIN >=32 RESISTANT Resistant     CEFAZOLIN <=4 SENSITIVE Sensitive     CEFTRIAXONE <=1 SENSITIVE Sensitive     CIPROFLOXACIN <=0.25 SENSITIVE Sensitive     GENTAMICIN <=1 SENSITIVE Sensitive     IMIPENEM <=0.25 SENSITIVE Sensitive     NITROFURANTOIN 128 RESISTANT Resistant     TRIMETH/SULFA <=20 SENSITIVE Sensitive     AMPICILLIN/SULBACTAM 16 INTERMEDIATE Intermediate     PIP/TAZO 16 SENSITIVE Sensitive     Extended ESBL NEGATIVE Sensitive     * >=100,000 COLONIES/mL KLEBSIELLA PNEUMONIAE  Culture, blood (routine x 2)     Status: None (Preliminary result)   Collection Time: 07/02/18 10:45 PM  Result Value Ref Range Status   Specimen Description BLOOD  LEFT ANTECUBITAL  Final   Special Requests   Final    BOTTLES DRAWN AEROBIC ONLY Blood Culture results may not be optimal due to an inadequate volume of blood received in culture bottles   Culture   Final    NO GROWTH < 24 HOURS Performed at [facility] [address], [place], [place] [address]    Report Status PENDING  Incomplete  Culture, blood (routine x 2)     Status: None (Preliminary result)   Collection Time: 07/02/18 10:51 PM  Result Value Ref Range Status   Specimen  Description BLOOD LEFT FOREARM  Final   Special Requests   Final    BOTTLES DRAWN AEROBIC AND ANAEROBIC Blood Culture results may not be optimal due to an inadequate volume of blood received in culture bottles   Culture   Final    NO GROWTH < 24 HOURS Performed at [facility] [address] [place], [place] [address]    Report Status PENDING  Incomplete  Culture, blood (routine x 2)     Status: None (Preliminary result)   Collection Time: 07/03/18  4:15 PM  Result Value Ref Range Status   Specimen Description BLOOD RIGHT ANTECUBITAL  Final   Special Requests   Final    BOTTLES DRAWN AEROBIC AND ANAEROBIC Blood Culture adequate volume   Culture  Setup Time   Final    GRAM NEGATIVE RODS IN BOTH AEROBIC AND ANAEROBIC BOTTLES CRITICAL RESULT CALLED TO, READ BACK BY AND VERIFIED WITH: Zoila Shutter [phone] Performed at [facility], [place] [address] [place], [place] [address]    Culture GRAM NEGATIVE RODS  Final   Report Status PENDING  Incomplete  Blood Culture ID Panel (Reflexed)     Status: Abnormal   Collection Time: 07/03/18  4:15 PM  Result Value Ref Range Status   Enterococcus species NOT DETECTED NOT DETECTED Final   Listeria monocytogenes NOT DETECTED NOT DETECTED Final   Staphylococcus species NOT DETECTED NOT DETECTED Final   Staphylococcus aureus (BCID) NOT DETECTED NOT DETECTED Final   Streptococcus species NOT DETECTED NOT DETECTED Final   Streptococcus agalactiae  NOT DETECTED NOT DETECTED Final   Streptococcus pneumoniae NOT DETECTED NOT DETECTED Final   Streptococcus pyogenes NOT DETECTED NOT DETECTED Final   Acinetobacter baumannii NOT DETECTED NOT DETECTED Final   Enterobacteriaceae species DETECTED (A) NOT DETECTED Final    Comment: Enterobacteriaceae represent a large family of gram-negative bacteria, not a single organism. CRITICAL RESULT CALLED TO, READ BACK BY AND VERIFIED WITH: PHARMD V BRYK [phone] MLM    Enterobacter cloacae complex NOT DETECTED NOT DETECTED Final   Escherichia coli DETECTED (A) NOT DETECTED Final    Comment: CRITICAL RESULT CALLED TO, READ BACK BY AND VERIFIED WITH: PHARMD V BRYK [phone] MLM    Klebsiella oxytoca NOT DETECTED NOT DETECTED Final   Klebsiella pneumoniae NOT DETECTED NOT DETECTED Final   Proteus species NOT DETECTED NOT DETECTED Final   Serratia marcescens NOT DETECTED NOT DETECTED Final   Carbapenem resistance NOT DETECTED NOT DETECTED Final   Haemophilus influenzae NOT DETECTED NOT DETECTED Final   Neisseria meningitidis NOT DETECTED NOT DETECTED Final   Pseudomonas aeruginosa NOT DETECTED NOT DETECTED Final   Candida albicans NOT DETECTED NOT DETECTED Final   Candida glabrata NOT DETECTED NOT DETECTED Final   Candida krusei NOT DETECTED NOT DETECTED Final   Candida parapsilosis NOT DETECTED NOT DETECTED Final   Candida tropicalis NOT DETECTED NOT DETECTED Final    Comment: Performed at [facility], [place] [address] [place], [place] [address]  Culture, blood (routine x 2)     Status: None (Preliminary result)   Collection Time: 07/03/18  4:30 PM  Result Value Ref Range Status   Specimen Description BLOOD LEFT HAND  Final   Special Requests   Final    BOTTLES DRAWN AEROBIC AND ANAEROBIC Blood Culture adequate volume   Culture  Setup Time   Final    GRAM NEGATIVE RODS IN BOTH AEROBIC AND ANAEROBIC BOTTLES CRITICAL VALUE  NOTED.  VALUE IS CONSISTENT WITH PREVIOUSLY REPORTED AND CALLED  VALUE. Performed at [facility], [place] [address] [place], [place] [address]    Culture GRAM NEGATIVE RODS  Final   Report Status PENDING  Incomplete         Radiology Studies: Dg Abd 1 View  Result Date: 07/03/2018 CLINICAL DATA:  84 year old male with vomiting and diarrhea. EXAM: ABDOMEN - 1 VIEW COMPARISON:  Chest radiographs 07/01/2018. FINDINGS: Portable AP supine view at 1850 hours. Bifurcated abdominal aortic endograft. Visualized bowel gas pattern is non obstructed. No pneumoperitoneum identified on this supine view. Small surgical clips and mesh type fast nerves project in the pelvis. No acute osseous abnormality identified. IMPRESSION: Normal visible bowel gas pattern. Abdominal aortic endograft in place. Electronically Signed   By: Genevie Ann M.D.   On: 07/03/2018 19:05   US Liver Doppler  Result Date: 07/03/2018 CLINICAL DATA:  Elevated LFTs. History of syncope and elevated troponins. EXAM: DUPLEX ULTRASOUND OF LIVER TECHNIQUE: Color and duplex Doppler ultrasound was performed to evaluate the hepatic in-flow and out-flow vessels. COMPARISON:  Abdominal ultrasound 07/01/2018 FINDINGS: Liver: Liver contour adjacent to the right kidney is slightly nodular. Again noted is a mildly complex hepatic cyst measuring roughly 3.1 cm. Liver parenchyma is mildly heterogeneous. Portal Vein Velocities Main:  41 cm/sec Right:  25 cm/sec Left:  22 cm/sec Hepatic Vein Velocities Right:  47 cm/sec Middle:  47 cm/sec Left:  44 cm/sec IVC: Present and patent with normal respiratory phasicity. Hepatic Artery Velocity:  140 cm/sec Splenic Vein Velocity:  46 cm/sec Varices: Absent Ascites: Absent Again noted is an abnormal gallbladder. Gallbladder has echogenic foci with areas of shadowing. Although the findings could be related to irregular gallstones, the appearance raises concern for underlying inflammation or neoplasm. Portal vein demonstrates normal hepatopetal flow towards the liver. Main portal  vein measures roughly 1.2 cm. Spleen size is upper limits of normal with a length of 10.4 cm. Normal hepatofugal flow in the hepatic veins. Splenic vein is patent. Probable splenule near the splenic hilum measuring up to 2.4 cm. IMPRESSION: 1. Portal venous system is patent with normal direction of flow. 2. Abnormal gallbladder. Suspect there are stones or calcifications but recommend further characterization with a post contrast CT to further characterize the gallbladder for an inflammatory or neoplastic process. 3. Nodular contour of the liver and findings could be related to cirrhosis. This finding could also be further characterized with cross-sectional imaging. Spleen size is upper limits of normal. These results will be called to the ordering clinician or representative by the Radiologist Assistant, and communication documented in the PACS or zVision Dashboard. Electronically Signed   By: [name] Daft M.D.   On: 07/03/2018 12:26        Scheduled Meds: . diltiazem  120 mg Oral Daily  . docusate sodium  100 mg Oral BID  . insulin aspart  0-9 Units Subcutaneous TID WC  . metoprolol succinate  25 mg Oral BID  . pantoprazole (PROTONIX) IV  40 mg Intravenous Q12H  . sodium chloride flush  3 mL Intravenous Q12H  . sodium chloride flush  3 mL Intravenous Q12H  . tamsulosin  0.4 mg Oral BID   Continuous Infusions: . sodium chloride    . sodium chloride 1 mL/kg/hr (07/04/18 0525)  . aztreonam    . diltiazem (CARDIZEM) infusion 5 mg/hr (07/04/18 0600)  . lactated ringers Stopped (07/04/18 0412)     LOS: 2 days  Aline August, MD [facility] Pager [phone]  If 7PM-7AM, please contact night-coverage [email or web address] Password [email or web address] 07/04/2018, 10:15 AM

## 2018-07-04 NOTE — Progress Notes (Signed)
   07/04/18 0116  Vitals  Temp 98.9 F (37.2 C)  BP 95/70  MAP (mmHg) 76  BP Method Automatic  Patient Position (if appropriate) Lying  Pulse Rate (!) 104  Pulse Rate Source Monitor  Resp 17  Oxygen Therapy  SpO2 95 %  O2 Device Nasal Cannula  O2 Flow Rate (L/min) 2 L/min  Pain Assessment  Pain Scale 0-10  Pain Score 0  Pt resting in bed comfortably, denied pain at this time.

## 2018-07-04 NOTE — Progress Notes (Signed)
Gastroenterology Inpatient Follow-up Note   PATIENT IDENTIFICATION  Ernest Parker is a 84 y.o. male with a pmh significant for AAA s/p Repair, Afib, GERD, HTN, HLD, Prostate CA (s/p radiation), s/p PPM who presented with syncope.  GI consulted due to abnormal LFTs.   Hospital Day: 4  SUBJECTIVE  LFTs continue to downtrend. Plan for cardiac catheterization tomorrow if Cr remains stable. His Liver doppler U/S shows normal flow.  However there are some concerns for the gallbladder as noted below. Family adamant that all records are in [place]. The patient denies fevers or chills. Mild LLQ abdominal discomfort. The daughter reports that back at [place], they had recommended the gallbladder come out at some point in time in the future but the patient had more significant issues at the time with his AAA.   OBJECTIVE  Scheduled Inpatient Medications:  . aspirin  81 mg Oral Pre-Cath  . docusate sodium  100 mg Oral BID  . insulin aspart  0-9 Units Subcutaneous TID WC  . metoprolol succinate  25 mg Oral BID  . pantoprazole (PROTONIX) IV  40 mg Intravenous Q12H  . sodium chloride flush  3 mL Intravenous Q12H  . sodium chloride flush  3 mL Intravenous Q12H  . tamsulosin  0.4 mg Oral BID   Continuous Inpatient Infusions:  . sodium chloride    . sodium chloride     Followed by  . sodium chloride    . diltiazem (CARDIZEM) infusion 5 mg/hr (07/04/18 0252)  . lactated ringers 75 mL/hr at 07/04/18 0212  . levofloxacin (LEVAQUIN) IV     PRN Inpatient Medications: sodium chloride, acetaminophen **OR** acetaminophen, metoprolol tartrate, morphine injection, ondansetron **OR** ondansetron (ZOFRAN) IV, sodium chloride flush   Physical Examination  Temp:  [98 F (36.7 C)-100.2 F (37.9 C)] 98.9 F (37.2 C) (10/24 0116) Pulse Rate:  [67-151] 67 (10/24 0249) Resp:  [17-22] 17 (10/24 0116) BP: (90-165)/(54-102) 90/60 (10/24 0249) SpO2:  [92 %-98 %] 98 % (10/24 0249) Weight:  [97.4 kg]  97.4 kg (10/23 0614) Temp (24hrs), Avg:99.1 F (37.3 C), Min:98 F (36.7 C), Max:100.2 F (37.9 C)  Weight: 97.4 kg GEN: NAD, daughter at bedside PSYCH: Cooperative EYE: Scleral icterus is noted ENT: MMM RESP: CTAB posteriorly GI: Soft, NT, without rebound or guarding MSK/EXT: LE edema present SKIN: Skin juandice present NEURO:  Alert & Oriented x 3, no focal deficits, no evidence of asterixis   Review of Data   Laboratory Studies   Recent Labs  Lab 07/03/18 0548  NA 136  K 3.5  CL 104  CO2 23  BUN 30*  CREATININE 1.79*  GLUCOSE 119*  CALCIUM 8.4*   Recent Labs  Lab 07/03/18 0548  AST 65*  ALT 191*  ALKPHOS 177*    Recent Labs  Lab 07/01/18 0552 07/02/18 0050 07/03/18 0548 07/03/18 1958  WBC 14.5* 11.7* 6.5  --   HGB 11.8* 10.4* 10.9* 10.7*  HCT 37.0* 32.7* 33.3* 32.7*  PLT 182 127* 120*  --    Recent Labs  Lab 07/01/18 1647 07/02/18 0050  07/03/18 0548 07/03/18 1528  APTT  --   --    < > 44* 54*  INR 1.13 1.43  --  1.11  --    < > = values in this interval not displayed.   Computed MELD-Na score unavailable. Necessary lab results were not found in the last year. Computed MELD score unavailable. Necessary lab results were not found in the last year.   GI  Procedures and Studies  None to review  Imaging Studies  Dg Abd 1 View  Result Date: 07/03/2018 CLINICAL DATA:  84 year old male with vomiting and diarrhea. EXAM: ABDOMEN - 1 VIEW COMPARISON:  Chest radiographs 07/01/2018. FINDINGS: Portable AP supine view at 1850 hours. Bifurcated abdominal aortic endograft. Visualized bowel gas pattern is non obstructed. No pneumoperitoneum identified on this supine view. Small surgical clips and mesh type fast nerves project in the pelvis. No acute osseous abnormality identified. IMPRESSION: Normal visible bowel gas pattern. Abdominal aortic endograft in place. Electronically Signed   By: Genevie Ann M.D.   On: 07/03/2018 19:05   US Liver Doppler  Result Date:  07/03/2018 CLINICAL DATA:  Elevated LFTs. History of syncope and elevated troponins. EXAM: DUPLEX ULTRASOUND OF LIVER TECHNIQUE: Color and duplex Doppler ultrasound was performed to evaluate the hepatic in-flow and out-flow vessels. COMPARISON:  Abdominal ultrasound 07/01/2018 FINDINGS: Liver: Liver contour adjacent to the right kidney is slightly nodular. Again noted is a mildly complex hepatic cyst measuring roughly 3.1 cm. Liver parenchyma is mildly heterogeneous. Portal Vein Velocities Main:  41 cm/sec Right:  25 cm/sec Left:  22 cm/sec Hepatic Vein Velocities Right:  47 cm/sec Middle:  47 cm/sec Left:  44 cm/sec IVC: Present and patent with normal respiratory phasicity. Hepatic Artery Velocity:  140 cm/sec Splenic Vein Velocity:  46 cm/sec Varices: Absent Ascites: Absent Again noted is an abnormal gallbladder. Gallbladder has echogenic foci with areas of shadowing. Although the findings could be related to irregular gallstones, the appearance raises concern for underlying inflammation or neoplasm. Portal vein demonstrates normal hepatopetal flow towards the liver. Main portal vein measures roughly 1.2 cm. Spleen size is upper limits of normal with a length of 10.4 cm. Normal hepatofugal flow in the hepatic veins. Splenic vein is patent. Probable splenule near the splenic hilum measuring up to 2.4 cm. IMPRESSION: 1. Portal venous system is patent with normal direction of flow. 2. Abnormal gallbladder. Suspect there are stones or calcifications but recommend further characterization with a post contrast CT to further characterize the gallbladder for an inflammatory or neoplastic process. 3. Nodular contour of the liver and findings could be related to cirrhosis. This finding could also be further characterized with cross-sectional imaging. Spleen size is upper limits of normal. These results will be called to the ordering clinician or representative by the Radiologist Assistant, and communication documented in  the PACS or zVision Dashboard. Electronically Signed   By: [name] Daft M.D.   On: 07/03/2018 12:26    ASSESSMENT  Mr. Tortorella is a 84 y.o. male with a pmh significant for AAA s/p Repair, Afib, GERD, HTN, HLD, Prostate CA (s/p radiation), s/p PPM who presented with syncope.  GI consulted due to abnormal LFTs.    The patient's LFTs continue to downtrend and hopefully this turns out to just be ischemic hepatopathy as an etiology.  No significant biliary colic and CBD dilation significantly.  The patient will need a contrasted imaging study with a CT-Abdomen with contrast vs a MRI/MRICP with contrast in the coming days to clarify and understand the etiology of the gallbladder pathology.  If this is ischemic hepatopathy they LFTs can trend downwards for up to 2-weeks.  However, we do not want to interfere with his catheterization tomorrow for his kidney's sake.  Plan to hold on contrasted imaging until after catheterization and after he is out of the AKI period.  If LFTs still downtrending, then this will likely require imaging on Friday or over the  weekend and can be ordered by the primary medical service when they feel the patient is out of the contrast induced nephropathy window.  What will be most important is that the patient's outside CT scans from [facility] are received to review by our pathology colleagues.  If this is a lesion that has been present previously then clarification of things can be helpful.  The primary medical service will need to try and reach out to get these.  I called and let the patient's daughter know that I looked through [facility] and I cannot see any of the CT scan results in the chart.  She stated that other providers could, but I am unable to access.  If they can see this then the other providers can help [place] with getting the reports of the imaging from his recent CT scans.   PLAN/RECOMMENDATIONS  Trend LFTs daily When the primary team feels the patient is out of the window for  contrast induced nephropathy then a CT-Abdomen with contrast vs a MRI/CP with contrast should be performed to clarify the patient's gallbladder region Please obtain the outside records/reports of the patient's multiple CT scans done at [facility] (they are not accessible on CareEverywhere) Ideally, the patient's outside CT scans are obtained and loaded into the system A surgical referral for consideration of gallbladder resection can be done closer to discharge so that he can meet with them as an outpatient   Please page/call with questions or concerns.   Justice Britain, MD [facility] Office # [phone]    LOS: 2 days  Irving Copas  07/04/2018, 4:10 AM

## 2018-07-04 NOTE — Significant Event (Signed)
Rapid Response Event Note  Overview:  RN called for a second set of eyes regarding new onset of all over body tremors.     Initial Focused Assessment: On arrival pt a/o x4, lying supine in bed, intermittent tremors to upper body, looked as if he was cold. Skin warm and dry. Pt reports ongoing abd discomfort denies any pain or worsening pain to abd, nausea, or dizziness. RN was talking to MD on my arrival.   Interventions: Placed two warm blankets. Tremors seemed to resolve before leaving.  MD gave new orders prior to my arrival -IV Lopressor 2.5 mg -start IV ABX  -ua and culture sample -BC x2 -Magnesium, CMP, CBC waiting results  Plan of Care (if not transferred): Continue to monitor, call RRT as needed.  Event Summary:   Event time [phone]      at          [facility], [place]

## 2018-07-04 NOTE — Progress Notes (Addendum)
Progress Note  Patient Name: Ernest Parker Date of Encounter: 07/04/2018  Primary Cardiologist: [name], MD   Subjective   Shaking chills, Rigor's yesterday.  Urine culture became positive for Klebsiella.  Cultures from 10/23 are also showing gram-negative rods, possibly/likely Klebsiella.  He is being treated with IV antibiotics.  Atrial fibrillation increased in heart rate, currently on Cardizem drip.  Inpatient Medications    Scheduled Meds: . docusate sodium  100 mg Oral BID  . insulin aspart  0-9 Units Subcutaneous TID WC  . metoprolol succinate  25 mg Oral BID  . pantoprazole (PROTONIX) IV  40 mg Intravenous Q12H  . sodium chloride flush  3 mL Intravenous Q12H  . sodium chloride flush  3 mL Intravenous Q12H  . tamsulosin  0.4 mg Oral BID   Continuous Infusions: . sodium chloride    . sodium chloride 1 mL/kg/hr (07/04/18 0525)  . diltiazem (CARDIZEM) infusion 5 mg/hr (07/04/18 0600)  . lactated ringers Stopped (07/04/18 0412)  . levofloxacin (LEVAQUIN) IV     PRN Meds: sodium chloride, acetaminophen **OR** acetaminophen, metoprolol tartrate, morphine injection, ondansetron **OR** ondansetron (ZOFRAN) IV, sodium chloride flush   Vital Signs    Vitals:   07/04/18 0116 07/04/18 0249 07/04/18 0522 07/04/18 0700  BP: 95/70 90/60 100/62 92/64  Pulse: (!) 104 67 73 75  Resp: 17     Temp: 98.9 F (37.2 C)  (!) 97.5 F (36.4 C) (!) 97.5 F (36.4 C)  TempSrc:   Oral Oral  SpO2: 95% 98% 97% 97%  Weight:   99.8 kg   Height:        Intake/Output Summary (Last 24 hours) at 07/04/2018 0837 Last data filed at 07/04/2018 0700 Gross per 24 hour  Intake 2073.63 ml  Output 1450 ml  Net 623.63 ml   Filed Weights   07/02/18 0203 07/03/18 0614 07/04/18 0522  Weight: 97.3 kg 97.4 kg 99.8 kg    Telemetry    A. fib with rapid ventricular response- Personally Reviewed  ECG    Atrial fibrillation- Personally Reviewed  Physical Exam   GEN: No acute distress.   Elderly Neck: No JVD Cardiac: IRREG, no murmurs, rubs, or gallops.  Respiratory: Clear to auscultation bilaterally. GI: Soft, nontender, non-distended  MS: No edema; No deformity. Neuro:  Nonfocal  Psych: Normal affect   Labs    Chemistry Recent Labs  Lab 07/02/18 0050 07/03/18 0548 07/04/18 0505  NA 135 136 136  K 3.3* 3.5 4.1  CL 106 104 104  CO2 22 23 22   GLUCOSE 161* 119* 172*  BUN 39* 30* 33*  CREATININE 2.45* 1.79* 2.14*  CALCIUM 8.5* 8.4* 8.0*  PROT 6.0* 5.8* 5.5*  ALBUMIN 2.7* 2.3* 2.2*  AST 184* 65* 43*  ALT 345* 191* 123*  ALKPHOS 219* 177* 153*  BILITOT 4.8* 3.2* 3.2*  GFRNONAA 23* 34* 27*  GFRAA 27* 39* 31*  ANIONGAP 7 9 10      Hematology Recent Labs  Lab 07/02/18 0050 07/03/18 0548 07/03/18 1958 07/04/18 0505  WBC 11.7* 6.5  --  11.3*  RBC 3.64* 3.73*  --  3.35*  HGB 10.4* 10.9* 10.7* 9.6*  HCT 32.7* 33.3* 32.7* 30.8*  MCV 89.8 89.3  --  91.9  MCH 28.6 29.2  --  28.7  MCHC 31.8 32.7  --  31.2  RDW 15.4 15.1  --  15.3  PLT 127* 120*  --  97*    Cardiac Enzymes Recent Labs  Lab 07/02/18 0050 07/02/18 0748  07/02/18 1435 07/02/18 1903  TROPONINI 0.93* 0.52* 0.37* 0.30*   No results for input(s): TROPIPOC in the last 168 hours.   BNPNo results for input(s): BNP, PROBNP in the last 168 hours.   DDimer No results for input(s): DDIMER in the last 168 hours.   Radiology    Dg Abd 1 View  Result Date: 07/03/2018 CLINICAL DATA:  84 year old male with vomiting and diarrhea. EXAM: ABDOMEN - 1 VIEW COMPARISON:  Chest radiographs 07/01/2018. FINDINGS: Portable AP supine view at 1850 hours. Bifurcated abdominal aortic endograft. Visualized bowel gas pattern is non obstructed. No pneumoperitoneum identified on this supine view. Small surgical clips and mesh type fast nerves project in the pelvis. No acute osseous abnormality identified. IMPRESSION: Normal visible bowel gas pattern. Abdominal aortic endograft in place. Electronically Signed   By: Genevie Ann M.D.   On: 07/03/2018 19:05   US Liver Doppler  Result Date: 07/03/2018 CLINICAL DATA:  Elevated LFTs. History of syncope and elevated troponins. EXAM: DUPLEX ULTRASOUND OF LIVER TECHNIQUE: Color and duplex Doppler ultrasound was performed to evaluate the hepatic in-flow and out-flow vessels. COMPARISON:  Abdominal ultrasound 07/01/2018 FINDINGS: Liver: Liver contour adjacent to the right kidney is slightly nodular. Again noted is a mildly complex hepatic cyst measuring roughly 3.1 cm. Liver parenchyma is mildly heterogeneous. Portal Vein Velocities Main:  41 cm/sec Right:  25 cm/sec Left:  22 cm/sec Hepatic Vein Velocities Right:  47 cm/sec Middle:  47 cm/sec Left:  44 cm/sec IVC: Present and patent with normal respiratory phasicity. Hepatic Artery Velocity:  140 cm/sec Splenic Vein Velocity:  46 cm/sec Varices: Absent Ascites: Absent Again noted is an abnormal gallbladder. Gallbladder has echogenic foci with areas of shadowing. Although the findings could be related to irregular gallstones, the appearance raises concern for underlying inflammation or neoplasm. Portal vein demonstrates normal hepatopetal flow towards the liver. Main portal vein measures roughly 1.2 cm. Spleen size is upper limits of normal with a length of 10.4 cm. Normal hepatofugal flow in the hepatic veins. Splenic vein is patent. Probable splenule near the splenic hilum measuring up to 2.4 cm. IMPRESSION: 1. Portal venous system is patent with normal direction of flow. 2. Abnormal gallbladder. Suspect there are stones or calcifications but recommend further characterization with a post contrast CT to further characterize the gallbladder for an inflammatory or neoplastic process. 3. Nodular contour of the liver and findings could be related to cirrhosis. This finding could also be further characterized with cross-sectional imaging. Spleen size is upper limits of normal. These results will be called to the ordering clinician or  representative by the Radiologist Assistant, and communication documented in the PACS or zVision Dashboard. Electronically Signed   By: [name] Daft M.D.   On: 07/03/2018 12:26    Cardiac Studies   EF 55 to 60%, pacemaker  Patient Profile     84 y.o. male with pacemaker with gram-negative rod bacteremia chronic indwelling Foley with elevated troponin likely type II myocardial infarction/demand ischemia, syncope/orthostasis from prerenal azotemia and infection.  Assessment & Plan    Permanent atrial fibrillation - Currently on IV diltiazem 5 mg to assist with heart rate elevation that occurred in the setting of his recent infection. -We will write for p.o. Cardizem 120 mg once a day.  His IV diltiazem should be able to be discontinued after this administration.  Heart rate is much improved.  Infection control will also help.  Gram-negative bacteremia - Klebsiella likely.  Does have pacemaker in place, would be  low likely however organism for endocarditis.  Continue with IV antibiotics.  Elevated troponin - likely type II myocardial infarction in the setting of significant hypotension/infection. -We are going to hold off on cardiac catheterization.  Pacemaker -Functioning well, appropriately.  For questions or updates, please contact [place] Please consult [email or web address] for contact info under       Signed, Candee Furbish, MD  07/04/2018, 8:37 AM

## 2018-07-04 NOTE — Progress Notes (Signed)
PHARMACY - PHYSICIAN COMMUNICATION CRITICAL VALUE ALERT - BLOOD CULTURE IDENTIFICATION (BCID)  Ernest Parker is an 84 y.o. male who presented to [facility] on 07/01/2018 with a chief complaint of UTI  Assessment: Patient presented to ED from home with unwitnessed fall/syncopal episode. Patient has a chronic indwelling foley. BCID with 4/4 E. Coli. Urine culture resulted with >100k Kleb pneumo. WBC 6.5>11.3. T max = 100.2  **Pt with PCN allergy - unknown childhood reaction - but pt/family states it was very serious and pt has never received PCN or cephalosporins since**  Name of physician (or Provider) Contacted: Dr. Starla Link  Current antibiotics: levofloxacin  Changes to prescribed antibiotics recommended: Start aztreonam 1 g IV q8h and stop levofloxacin. Can de-escalate pending susceptibilities. Foley was replaced prior to initiation of antibiotics. Discussed with MD about possibly needing to replace foley again with urosepsis.  Results for orders placed or performed during the hospital encounter of 07/01/18  Blood Culture ID Panel (Reflexed) (Collected: 07/03/2018  4:15 PM)  Result Value Ref Range   Enterococcus species NOT DETECTED NOT DETECTED   Listeria monocytogenes NOT DETECTED NOT DETECTED   Staphylococcus species NOT DETECTED NOT DETECTED   Staphylococcus aureus (BCID) NOT DETECTED NOT DETECTED   Streptococcus species NOT DETECTED NOT DETECTED   Streptococcus agalactiae NOT DETECTED NOT DETECTED   Streptococcus pneumoniae NOT DETECTED NOT DETECTED   Streptococcus pyogenes NOT DETECTED NOT DETECTED   Acinetobacter baumannii NOT DETECTED NOT DETECTED   Enterobacteriaceae species DETECTED (A) NOT DETECTED   Enterobacter cloacae complex NOT DETECTED NOT DETECTED   Escherichia coli DETECTED (A) NOT DETECTED   Klebsiella oxytoca NOT DETECTED NOT DETECTED   Klebsiella pneumoniae NOT DETECTED NOT DETECTED   Proteus species NOT DETECTED NOT DETECTED   Serratia marcescens NOT DETECTED  NOT DETECTED   Carbapenem resistance NOT DETECTED NOT DETECTED   Haemophilus influenzae NOT DETECTED NOT DETECTED   Neisseria meningitidis NOT DETECTED NOT DETECTED   Pseudomonas aeruginosa NOT DETECTED NOT DETECTED   Candida albicans NOT DETECTED NOT DETECTED   Candida glabrata NOT DETECTED NOT DETECTED   Candida krusei NOT DETECTED NOT DETECTED   Candida parapsilosis NOT DETECTED NOT DETECTED   Candida tropicalis NOT DETECTED NOT DETECTED   Vertis Kelch, PharmD PGY1 Pharmacy Resident Phone [phone] 07/04/2018       9:39 AM

## 2018-07-04 NOTE — Progress Notes (Signed)
Daily Rounding Note  07/04/2018, 8:29 AM  LOS: 2 days   SUBJECTIVE:   Chief complaint: abnormal  LFTs.     CG/burgundy emesis, foul smelling, starting yesterday evening along with persistent rapid Afib to 160s.  Cardizem gtt started.  Non-obstructive/normal BGP on KUB.    Urine growing Klebsiella.   Cardiac cath cancelled for today.   Patient had a bowel movement this morning which he flushed.  He did not look at it. Patient denies nausea, abdominal pain.  OBJECTIVE:         Vital signs in last 24 hours:    Temp:  [97.5 F (36.4 C)-100.2 F (37.9 C)] 97.5 F (36.4 C) (10/24 0700) Pulse Rate:  [67-151] 75 (10/24 0700) Resp:  [17-22] 17 (10/24 0116) BP: (90-165)/(54-102) 92/64 (10/24 0700) SpO2:  [92 %-98 %] 97 % (10/24 0700) FiO2 (%):  [20 %] 20 % (10/24 0700) Weight:  [99.8 kg] 99.8 kg (10/24 0522) Last BM Date: 07/02/18 Filed Weights   07/02/18 0203 07/03/18 0614 07/04/18 0522  Weight: 97.3 kg 97.4 kg 99.8 kg   General: Looks well.  No distress.  Resting comfortably in bed. Heart: Rate in 80s, irregular Chest: Clear bilaterally.  No labored breathing. Abdomen: Soft.  Active bowel sounds.  Nontender. Extremities: No CCE. Neuro/Psych: Affect normal.  Not acutely confused.  Intake/Output from previous day: 10/23 0701 - 10/24 0700 In: 2073.6 [I.V.:1973.6] Out: 1450 [Urine:1350; Emesis/NG output:100]  Intake/Output this shift: No intake/output data recorded.  Lab Results: Recent Labs    07/02/18 0050 07/03/18 0548 07/03/18 1958 07/04/18 0505  WBC 11.7* 6.5  --  11.3*  HGB 10.4* 10.9* 10.7* 9.6*  HCT 32.7* 33.3* 32.7* 30.8*  PLT 127* 120*  --  97*   BMET Recent Labs    07/02/18 0050 07/03/18 0548 07/04/18 0505  NA 135 136 136  K 3.3* 3.5 4.1  CL 106 104 104  CO2 22 23 22   GLUCOSE 161* 119* 172*  BUN 39* 30* 33*  CREATININE 2.45* 1.79* 2.14*  CALCIUM 8.5* 8.4* 8.0*   LFT Recent Labs   07/01/18 1445  07/01/18 1647 07/02/18 0050 07/03/18 0548 07/04/18 0505  PROT  --    < > 6.6 6.0* 5.8* 5.5*  ALBUMIN  --    < > 2.9* 2.7* 2.3* 2.2*  AST  --    < > 263* 184* 65* 43*  ALT  --    < > 453* 345* 191* 123*  ALKPHOS  --    < > 278* 219* 177* 153*  BILITOT  --    < > 6.1* 4.8* 3.2* 3.2*  BILIDIR 3.7*  --  3.9*  --   --   --   IBILI  --   --  2.2*  --   --   --    < > = values in this interval not displayed.   PT/INR Recent Labs    07/02/18 0050 07/03/18 0548  LABPROT 17.3* 14.2  INR 1.43 1.11   Hepatitis Panel Recent Labs    07/01/18 1647  HEPBSAG Negative  HCVAB 0.1  HEPAIGM Negative  HEPBIGM Negative    Studies/Results: Dg Abd 1 View  Result Date: 07/03/2018 CLINICAL DATA:  84 year old male with vomiting and diarrhea. EXAM: ABDOMEN - 1 VIEW COMPARISON:  Chest radiographs 07/01/2018. FINDINGS: Portable AP supine view at 1850 hours. Bifurcated abdominal aortic endograft. Visualized bowel gas pattern is non obstructed. No pneumoperitoneum identified on this supine  view. Small surgical clips and mesh type fast nerves project in the pelvis. No acute osseous abnormality identified. IMPRESSION: Normal visible bowel gas pattern. Abdominal aortic endograft in place. Electronically Signed   By: Genevie Ann M.D.   On: 07/03/2018 19:05   US Liver Doppler  Result Date: 07/03/2018 CLINICAL DATA:  Elevated LFTs. History of syncope and elevated troponins. EXAM: DUPLEX ULTRASOUND OF LIVER TECHNIQUE: Color and duplex Doppler ultrasound was performed to evaluate the hepatic in-flow and out-flow vessels. COMPARISON:  Abdominal ultrasound 07/01/2018 FINDINGS: Liver: Liver contour adjacent to the right kidney is slightly nodular. Again noted is a mildly complex hepatic cyst measuring roughly 3.1 cm. Liver parenchyma is mildly heterogeneous. Portal Vein Velocities Main:  41 cm/sec Right:  25 cm/sec Left:  22 cm/sec Hepatic Vein Velocities Right:  47 cm/sec Middle:  47 cm/sec Left:  44  cm/sec IVC: Present and patent with normal respiratory phasicity. Hepatic Artery Velocity:  140 cm/sec Splenic Vein Velocity:  46 cm/sec Varices: Absent Ascites: Absent Again noted is an abnormal gallbladder. Gallbladder has echogenic foci with areas of shadowing. Although the findings could be related to irregular gallstones, the appearance raises concern for underlying inflammation or neoplasm. Portal vein demonstrates normal hepatopetal flow towards the liver. Main portal vein measures roughly 1.2 cm. Spleen size is upper limits of normal with a length of 10.4 cm. Normal hepatofugal flow in the hepatic veins. Splenic vein is patent. Probable splenule near the splenic hilum measuring up to 2.4 cm. IMPRESSION: 1. Portal venous system is patent with normal direction of flow. 2. Abnormal gallbladder. Suspect there are stones or calcifications but recommend further characterization with a post contrast CT to further characterize the gallbladder for an inflammatory or neoplastic process. 3. Nodular contour of the liver and findings could be related to cirrhosis. This finding could also be further characterized with cross-sectional imaging. Spleen size is upper limits of normal. These results will be called to the ordering clinician or representative by the Radiologist Assistant, and communication documented in the PACS or zVision Dashboard. Electronically Signed   By: [name] Daft M.D.   On: 07/03/2018 12:26   Scheduled Meds: . docusate sodium  100 mg Oral BID  . insulin aspart  0-9 Units Subcutaneous TID WC  . metoprolol succinate  25 mg Oral BID  . pantoprazole (PROTONIX) IV  40 mg Intravenous Q12H  . sodium chloride flush  3 mL Intravenous Q12H  . sodium chloride flush  3 mL Intravenous Q12H  . tamsulosin  0.4 mg Oral BID   Continuous Infusions: . sodium chloride    . sodium chloride 1 mL/kg/hr (07/04/18 0525)  . diltiazem (CARDIZEM) infusion 5 mg/hr (07/04/18 0600)  . lactated ringers Stopped (07/04/18  0412)  . levofloxacin (LEVAQUIN) IV     PRN Meds:.sodium chloride, acetaminophen **OR** acetaminophen, metoprolol tartrate, morphine injection, ondansetron **OR** ondansetron (ZOFRAN) IV, sodium chloride flush  ASSESMENT:   *   CGE.  Last night.  None today.  No nausea currently.  *    Elevated LFTs.  Transaminase pattern not c/w ETOH hepatitis.  Steadily improving. Direct bili slightly >> than indirect.   Ultrasound: Cholelithiasis without cholecystitis and no evidence of biliary obstruction, left lobe liver cyst. Doppler study of liver: nodular liver. Portal venous system patent with normal direction of flow.  Abnormal gallbladder. Suspect there are stones or calcifications but recommend post contrast CT to further characterize gallbladder for inflammatory or neoplastic process. Spleen size is upper limits of normal. Acute viral hepatitis  serologies all negative. Pt/inr rising but this may be the eliquis.   Suspect that ischemia/shock liver is culprit in setting of syncopal event.      *     Atrial fibrillation, rapid rate overnight.  chronic Eliquis on hold.  Heparin drip d/c'd 1840 yesterday.   Rate now is in the 80s. Troponins elevated.  Echo  EF 55 to 60%.   Cardiac cath canceled.  *   Given possible/likely cirrhosis, tachycardia, thrombocytopenia wonder if pt gong through ETOH withdrawal.  However patient and his daughter state rare ETOH and certainly none in recent weeks.  No history of heavy alcohol abuse..    *   AKI, progressive.    *     Normocytic anemia. Hgb 10.9 >> 9.6.     *    Thrombocytopenia.  *    9.7 cm AAA, status post stent graft repair 02/2018.   *   Klebsiella UTI. Leukocytosis.  Temp to 100.2.   Levaquin day 1-2.  Urinary retention, foley cath in place.     PLAN   *     Patient is going to need an EGD.  Will iscuss with GI MD and determine timing. Going to switch him from IV Protonix to oral Protonix twice daily. CBC in the morning.  Hgb  tonight  [facility] staff working on trying to get records of previous abdominal imaging studies from the hospital in [facility] where he was treated in the past.      Azucena Freed  07/04/2018, 8:29 AM Phone [phone]

## 2018-07-04 NOTE — Plan of Care (Signed)
  Problem: Education: Goal: Knowledge of General Education information will improve Description Including pain rating scale, medication(s)/side effects and non-pharmacologic comfort measures Outcome: Progressing   Problem: Health Behavior/Discharge Planning: Goal: Ability to manage health-related needs will improve Outcome: Progressing   Problem: Clinical Measurements: Goal: Ability to maintain clinical measurements within normal limits will improve Outcome: Progressing   Problem: Activity: Goal: Risk for activity intolerance will decrease Outcome: Progressing   Problem: Elimination: Goal: Will not experience complications related to bowel motility Outcome: Progressing Goal: Will not experience complications related to urinary retention Outcome: Progressing   Problem: Pain Managment: Goal: General experience of comfort will improve Outcome: Progressing   Problem: Skin Integrity: Goal: Risk for impaired skin integrity will decrease Outcome: Progressing

## 2018-07-04 NOTE — H&P (View-Only) (Signed)
Daily Rounding Note  07/04/2018, 8:29 AM  LOS: 2 days   SUBJECTIVE:   Chief complaint: abnormal  LFTs.     CG/burgundy emesis, foul smelling, starting yesterday evening along with persistent rapid Afib to 160s.  Cardizem gtt started.  Non-obstructive/normal BGP on KUB.    Urine growing Klebsiella.   Cardiac cath cancelled for today.   Patient had a bowel movement this morning which he flushed.  He did not look at it. Patient denies nausea, abdominal pain.  OBJECTIVE:         Vital signs in last 24 hours:    Temp:  [97.5 F (36.4 C)-100.2 F (37.9 C)] 97.5 F (36.4 C) (10/24 0700) Pulse Rate:  [67-151] 75 (10/24 0700) Resp:  [17-22] 17 (10/24 0116) BP: (90-165)/(54-102) 92/64 (10/24 0700) SpO2:  [92 %-98 %] 97 % (10/24 0700) FiO2 (%):  [20 %] 20 % (10/24 0700) Weight:  [99.8 kg] 99.8 kg (10/24 0522) Last BM Date: 07/02/18 Filed Weights   07/02/18 0203 07/03/18 0614 07/04/18 0522  Weight: 97.3 kg 97.4 kg 99.8 kg   General: Looks well.  No distress.  Resting comfortably in bed. Heart: Rate in 80s, irregular Chest: Clear bilaterally.  No labored breathing. Abdomen: Soft.  Active bowel sounds.  Nontender. Extremities: No CCE. Neuro/Psych: Affect normal.  Not acutely confused.  Intake/Output from previous day: 10/23 0701 - 10/24 0700 In: 2073.6 [I.V.:1973.6] Out: 1450 [Urine:1350; Emesis/NG output:100]  Intake/Output this shift: No intake/output data recorded.  Lab Results: Recent Labs    07/02/18 0050 07/03/18 0548 07/03/18 1958 07/04/18 0505  WBC 11.7* 6.5  --  11.3*  HGB 10.4* 10.9* 10.7* 9.6*  HCT 32.7* 33.3* 32.7* 30.8*  PLT 127* 120*  --  97*   BMET Recent Labs    07/02/18 0050 07/03/18 0548 07/04/18 0505  NA 135 136 136  K 3.3* 3.5 4.1  CL 106 104 104  CO2 22 23 22   GLUCOSE 161* 119* 172*  BUN 39* 30* 33*  CREATININE 2.45* 1.79* 2.14*  CALCIUM 8.5* 8.4* 8.0*   LFT Recent Labs   07/01/18 1445  07/01/18 1647 07/02/18 0050 07/03/18 0548 07/04/18 0505  PROT  --    < > 6.6 6.0* 5.8* 5.5*  ALBUMIN  --    < > 2.9* 2.7* 2.3* 2.2*  AST  --    < > 263* 184* 65* 43*  ALT  --    < > 453* 345* 191* 123*  ALKPHOS  --    < > 278* 219* 177* 153*  BILITOT  --    < > 6.1* 4.8* 3.2* 3.2*  BILIDIR 3.7*  --  3.9*  --   --   --   IBILI  --   --  2.2*  --   --   --    < > = values in this interval not displayed.   PT/INR Recent Labs    07/02/18 0050 07/03/18 0548  LABPROT 17.3* 14.2  INR 1.43 1.11   Hepatitis Panel Recent Labs    07/01/18 1647  HEPBSAG Negative  HCVAB 0.1  HEPAIGM Negative  HEPBIGM Negative    Studies/Results: Dg Abd 1 View  Result Date: 07/03/2018 CLINICAL DATA:  84 year old male with vomiting and diarrhea. EXAM: ABDOMEN - 1 VIEW COMPARISON:  Chest radiographs 07/01/2018. FINDINGS: Portable AP supine view at 1850 hours. Bifurcated abdominal aortic endograft. Visualized bowel gas pattern is non obstructed. No pneumoperitoneum identified on this supine  view. Small surgical clips and mesh type fast nerves project in the pelvis. No acute osseous abnormality identified. IMPRESSION: Normal visible bowel gas pattern. Abdominal aortic endograft in place. Electronically Signed   By: Genevie Ann M.D.   On: 07/03/2018 19:05   US Liver Doppler  Result Date: 07/03/2018 CLINICAL DATA:  Elevated LFTs. History of syncope and elevated troponins. EXAM: DUPLEX ULTRASOUND OF LIVER TECHNIQUE: Color and duplex Doppler ultrasound was performed to evaluate the hepatic in-flow and out-flow vessels. COMPARISON:  Abdominal ultrasound 07/01/2018 FINDINGS: Liver: Liver contour adjacent to the right kidney is slightly nodular. Again noted is a mildly complex hepatic cyst measuring roughly 3.1 cm. Liver parenchyma is mildly heterogeneous. Portal Vein Velocities Main:  41 cm/sec Right:  25 cm/sec Left:  22 cm/sec Hepatic Vein Velocities Right:  47 cm/sec Middle:  47 cm/sec Left:  44  cm/sec IVC: Present and patent with normal respiratory phasicity. Hepatic Artery Velocity:  140 cm/sec Splenic Vein Velocity:  46 cm/sec Varices: Absent Ascites: Absent Again noted is an abnormal gallbladder. Gallbladder has echogenic foci with areas of shadowing. Although the findings could be related to irregular gallstones, the appearance raises concern for underlying inflammation or neoplasm. Portal vein demonstrates normal hepatopetal flow towards the liver. Main portal vein measures roughly 1.2 cm. Spleen size is upper limits of normal with a length of 10.4 cm. Normal hepatofugal flow in the hepatic veins. Splenic vein is patent. Probable splenule near the splenic hilum measuring up to 2.4 cm. IMPRESSION: 1. Portal venous system is patent with normal direction of flow. 2. Abnormal gallbladder. Suspect there are stones or calcifications but recommend further characterization with a post contrast CT to further characterize the gallbladder for an inflammatory or neoplastic process. 3. Nodular contour of the liver and findings could be related to cirrhosis. This finding could also be further characterized with cross-sectional imaging. Spleen size is upper limits of normal. These results will be called to the ordering clinician or representative by the Radiologist Assistant, and communication documented in the PACS or zVision Dashboard. Electronically Signed   By: [name] Daft M.D.   On: 07/03/2018 12:26   Scheduled Meds: . docusate sodium  100 mg Oral BID  . insulin aspart  0-9 Units Subcutaneous TID WC  . metoprolol succinate  25 mg Oral BID  . pantoprazole (PROTONIX) IV  40 mg Intravenous Q12H  . sodium chloride flush  3 mL Intravenous Q12H  . sodium chloride flush  3 mL Intravenous Q12H  . tamsulosin  0.4 mg Oral BID   Continuous Infusions: . sodium chloride    . sodium chloride 1 mL/kg/hr (07/04/18 0525)  . diltiazem (CARDIZEM) infusion 5 mg/hr (07/04/18 0600)  . lactated ringers Stopped (07/04/18  0412)  . levofloxacin (LEVAQUIN) IV     PRN Meds:.sodium chloride, acetaminophen **OR** acetaminophen, metoprolol tartrate, morphine injection, ondansetron **OR** ondansetron (ZOFRAN) IV, sodium chloride flush  ASSESMENT:   *   CGE.  Last night.  None today.  No nausea currently.  *    Elevated LFTs.  Transaminase pattern not c/w ETOH hepatitis.  Steadily improving. Direct bili slightly >> than indirect.   Ultrasound: Cholelithiasis without cholecystitis and no evidence of biliary obstruction, left lobe liver cyst. Doppler study of liver: nodular liver. Portal venous system patent with normal direction of flow.  Abnormal gallbladder. Suspect there are stones or calcifications but recommend post contrast CT to further characterize gallbladder for inflammatory or neoplastic process. Spleen size is upper limits of normal. Acute viral hepatitis  serologies all negative. Pt/inr rising but this may be the eliquis.   Suspect that ischemia/shock liver is culprit in setting of syncopal event.      *     Atrial fibrillation, rapid rate overnight.  chronic Eliquis on hold.  Heparin drip d/c'd 1840 yesterday.   Rate now is in the 80s. Troponins elevated.  Echo  EF 55 to 60%.   Cardiac cath canceled.  *   Given possible/likely cirrhosis, tachycardia, thrombocytopenia wonder if pt gong through ETOH withdrawal.  However patient and his daughter state rare ETOH and certainly none in recent weeks.  No history of heavy alcohol abuse..    *   AKI, progressive.    *     Normocytic anemia. Hgb 10.9 >> 9.6.     *    Thrombocytopenia.  *    9.7 cm AAA, status post stent graft repair 02/2018.   *   Klebsiella UTI. Leukocytosis.  Temp to 100.2.   Levaquin day 1-2.  Urinary retention, foley cath in place.     PLAN   *     Patient is going to need an EGD.  Will iscuss with GI MD and determine timing. Going to switch him from IV Protonix to oral Protonix twice daily. CBC in the morning.  Hgb  tonight  [facility] staff working on trying to get records of previous abdominal imaging studies from the hospital in [facility] where he was treated in the past.      Azucena Freed  07/04/2018, 8:29 AM Phone [phone]

## 2018-07-05 ENCOUNTER — Encounter (HOSPITAL_COMMUNITY): Payer: Self-pay | Admitting: *Deleted

## 2018-07-05 ENCOUNTER — Encounter (HOSPITAL_COMMUNITY)
Admission: EM | Disposition: A | Discharge status: Home / Self Care | Payer: Self-pay | Source: Home / Self Care | Attending: Internal Medicine

## 2018-07-05 ENCOUNTER — Inpatient Hospital Stay (HOSPITAL_COMMUNITY): Payer: Medicare Other | Admitting: Anesthesiology

## 2018-07-05 DIAGNOSIS — D649 Anemia, unspecified: Secondary | ICD-10-CM

## 2018-07-05 DIAGNOSIS — I4891 Unspecified atrial fibrillation: Secondary | ICD-10-CM

## 2018-07-05 DIAGNOSIS — K920 Hematemesis: Secondary | ICD-10-CM

## 2018-07-05 HISTORY — PX: ESOPHAGOGASTRODUODENOSCOPY (EGD) WITH PROPOFOL: SHX5813

## 2018-07-05 HISTORY — PX: BIOPSY: SHX5522

## 2018-07-05 HISTORY — PX: EUS: SHX5427

## 2018-07-05 LAB — CBC WITH DIFFERENTIAL/PLATELET
ABS IMMATURE GRANULOCYTES: 0.03 10*3/uL (ref 0.00–0.07)
BASOS ABS: 0 10*3/uL (ref 0.0–0.1)
BASOS PCT: 0 %
EOS ABS: 0.1 10*3/uL (ref 0.0–0.5)
EOS PCT: 1 %
HEMATOCRIT: 30.7 % — AB (ref 39.0–52.0)
HEMOGLOBIN: 9.3 g/dL — AB (ref 13.0–17.0)
Immature Granulocytes: 0 %
LYMPHS ABS: 0.9 10*3/uL (ref 0.7–4.0)
Lymphocytes Relative: 12 %
MCH: 28.1 pg (ref 26.0–34.0)
MCHC: 30.3 g/dL (ref 30.0–36.0)
MCV: 92.7 fL (ref 80.0–100.0)
Monocytes Absolute: 0.5 10*3/uL (ref 0.1–1.0)
Monocytes Relative: 7 %
NEUTROS PCT: 80 %
NRBC: 0 % (ref 0.0–0.2)
Neutro Abs: 5.7 10*3/uL (ref 1.7–7.7)
Platelets: 98 10*3/uL — ABNORMAL LOW (ref 150–400)
RBC: 3.31 MIL/uL — AB (ref 4.22–5.81)
RDW: 15.5 % (ref 11.5–15.5)
WBC: 7.3 10*3/uL (ref 4.0–10.5)

## 2018-07-05 LAB — GLUCOSE, CAPILLARY
GLUCOSE-CAPILLARY: 95 mg/dL (ref 70–99)
GLUCOSE-CAPILLARY: 145 mg/dL — AB (ref 70–99)
Glucose-Capillary: 92 mg/dL (ref 70–99)
Glucose-Capillary: 99 mg/dL (ref 70–99)

## 2018-07-05 LAB — COMPREHENSIVE METABOLIC PANEL
ALK PHOS: 152 U/L — AB (ref 38–126)
ALT: 95 U/L — AB (ref 0–44)
AST: 38 U/L (ref 15–41)
Albumin: 2.2 g/dL — ABNORMAL LOW (ref 3.5–5.0)
Anion gap: 9 (ref 5–15)
BILIRUBIN TOTAL: 3.7 mg/dL — AB (ref 0.3–1.2)
BUN: 31 mg/dL — ABNORMAL HIGH (ref 8–23)
CALCIUM: 8.4 mg/dL — AB (ref 8.9–10.3)
CO2: 23 mmol/L (ref 22–32)
CREATININE: 1.87 mg/dL — AB (ref 0.61–1.24)
Chloride: 108 mmol/L (ref 98–111)
GFR, EST AFRICAN AMERICAN: 37 mL/min — AB (ref >60)
GFR, EST NON AFRICAN AMERICAN: 32 mL/min — AB (ref >60)
Glucose, Bld: 106 mg/dL — ABNORMAL HIGH (ref 70–99)
Potassium: 3.9 mmol/L (ref 3.5–5.1)
Sodium: 140 mmol/L (ref 135–145)
TOTAL PROTEIN: 5.5 g/dL — AB (ref 6.5–8.1)

## 2018-07-05 LAB — URINE CULTURE: Culture: >=100000 — AB

## 2018-07-05 LAB — MAGNESIUM: MAGNESIUM: 2.2 mg/dL (ref 1.7–2.4)

## 2018-07-05 SURGERY — ESOPHAGOGASTRODUODENOSCOPY (EGD) WITH PROPOFOL
Anesthesia: Monitor Anesthesia Care

## 2018-07-05 MED ORDER — PROPOFOL 10 MG/ML IV BOLUS
INTRAVENOUS | Status: DC | PRN
  Administered 2018-07-05 (×2): 20 mg via INTRAVENOUS

## 2018-07-05 MED ORDER — PROPOFOL 500 MG/50ML IV EMUL
INTRAVENOUS | Status: DC | PRN
  Administered 2018-07-05: 100 ug/kg/min via INTRAVENOUS

## 2018-07-05 MED ORDER — LACTATED RINGERS IV SOLN
INTRAVENOUS | Status: DC | PRN
  Administered 2018-07-05: 14:00:00 via INTRAVENOUS

## 2018-07-05 MED ORDER — PHENYLEPHRINE HCL 10 MG/ML IJ SOLN
INTRAMUSCULAR | Status: DC | PRN
  Administered 2018-07-05: 120 ug via INTRAVENOUS
  Administered 2018-07-05: 80 ug via INTRAVENOUS
  Administered 2018-07-05: 120 ug via INTRAVENOUS
  Administered 2018-07-05: 80 ug via INTRAVENOUS
  Administered 2018-07-05: 120 ug via INTRAVENOUS
  Administered 2018-07-05: 80 ug via INTRAVENOUS

## 2018-07-05 MED ORDER — LIDOCAINE 2% (20 MG/ML) 5 ML SYRINGE
INTRAMUSCULAR | Status: DC | PRN
  Administered 2018-07-05: 60 mg via INTRAVENOUS

## 2018-07-05 MED ORDER — LEVOFLOXACIN IN D5W 500 MG/100ML IV SOLN
500.0000 mg | INTRAVENOUS | Status: DC
  Administered 2018-07-05 – 2018-07-10 (×6): 500 mg via INTRAVENOUS
  Filled 2018-07-05 (×6): qty 100

## 2018-07-05 MED ORDER — ONDANSETRON HCL 4 MG/2ML IJ SOLN: 4.0000 mg | Freq: Once | INTRAMUSCULAR | Status: DC | PRN

## 2018-07-05 SURGICAL SUPPLY — 15 items

## 2018-07-05 NOTE — Progress Notes (Signed)
Both IV Infiltrated, patient without an IV. IV team at bedside, not able to place new one. Called endoscopy to let them know and that we are waiting on IV ultrasound. Per endoscopy, bring the patient down, they will try to place one.

## 2018-07-05 NOTE — Progress Notes (Signed)
Patient and family advised that patient is on clear liquid diet for now per MD Alekh K. However, daughter admitted  that she gave bag a chips and 3 crackers to his dad.  GI at bedside talking to the family about results of endoscopy.   Melbert Botelho, RN

## 2018-07-05 NOTE — Anesthesia Procedure Notes (Signed)
Procedure Name: MAC Date/Time: 07/05/2018 1:53 PM Performed by: Renato Shin, CRNA Pre-anesthesia Checklist: Patient identified, Emergency Drugs available, Suction available and Patient being monitored Patient Re-evaluated:Patient Re-evaluated prior to induction Oxygen Delivery Method: Nasal cannula Preoxygenation: Pre-oxygenation with 100% oxygen Induction Type: IV induction Placement Confirmation: positive ETCO2,  CO2 detector and breath sounds checked- equal and bilateral Dental Injury: Teeth and Oropharynx as per pre-operative assessment

## 2018-07-05 NOTE — Op Note (Signed)
[facility] Patient Name: Ernest Parker Procedure Date : 07/05/2018 MRN: [id number] Attending MD: Justice Britain , MD Date of Birth: [birth year] CSN: [id number] Age: 84 Admit Type: Inpatient Procedure:                Upper EUS Indications:              Abnormal ultrasound of the abdomen, Acute post                            hemorrhagic anemia, Coffee-ground emesis Providers:                Justice Britain, MD, Vista Lawman, RN, Elspeth Cho Tech., Technician Referring MD:              Medicines:                Monitored Anesthesia Care Complications:            No immediate complications. Estimated Blood Loss:     Estimated blood loss was minimal. Procedure:                Pre-Anesthesia Assessment:                           - Prior to the procedure, a History and Physical                            was performed, and patient medications and                            allergies were reviewed. The patient's tolerance of                            previous anesthesia was also reviewed. The risks                            and benefits of the procedure and the sedation                            options and risks were discussed with the patient.                            All questions were answered, and informed consent                            was obtained. Prior Anticoagulants: The patient has                            taken heparin, last dose was 2 days prior to                            procedure. ASA Grade Assessment: III - A patient  with severe systemic disease. After reviewing the                            risks and benefits, the patient was deemed in                            satisfactory condition to undergo the procedure.                           After obtaining informed consent, the endoscope was                            passed under direct vision. Throughout the   procedure, the patient's blood pressure, pulse, and                            oxygen saturations were monitored continuously. The                            [id number] ([id number]) Olympus Linear EUS was                            introduced through the mouth, and advanced to the                            duodenum for ultrasound examination from the                            stomach and duodenum. The upper EUS was                            accomplished without difficulty. The patient                            tolerated the procedure. Scope In: Scope Out: Findings:      ENDOSCOPIC FINDING: :      No gross lesions were noted in the proximal esophagus.      Three linear esophageal ulcers with no bleeding and stigmata of recent       bleeding were found in the mid esophagus. Biopsies were taken with a       cold forceps for histology.      Two tongues of salmon-colored mucosa were present from 30 to 36 cm. The       maximum longitudinal extent of these esophageal mucosal changes was 6 cm       in length. Biopsies were taken with a cold forceps for histology.      A medium non-bleeding Mallory-Weiss tear was found but looks to be       healing right at the Z-line junction into the hiatal hernia.      The Z-line was irregular and was found 36 cm from the incisors.      A medium-sized hiatal hernia was found. The proximal extent of the       gastric folds (end of tubular esophagus) was 37 cm from the incisors.       The hiatal narrowing was 42 cm from the  incisors.      No gross lesions were noted in the entire examined stomach otherwise.       Biopsies were taken with a cold forceps for histology and Helicobacter       pylori testing.      Multiple diffuse, small erosions were found in the duodenal bulb.      A medium diverticulum where the ampullary diverticulum was noted.      The major papilla was normal but completely intradiverticular.      No gross lesions were noted in the third  portion of the duodenum.      ENDOSONOGRAPHIC FINDING: :      Extensive hyperechoic material consistent with sludge was visualized       endosonographically in the gallbladder. However, this obscures the       common hepatic duct region due to the shadowing defects that are noted.      There was dilation in the middle third of the main bile duct. There is       obscuring as a result of the gallbladder itself of the upper third of       the main bile duct. The main CBD in the head of the pancreas before the       cystic duct insertion is 2.6 mm -> 3.4 mm.      Moderate hyperechoic material consistent with sludge was visualized       endosonographically in the presumed middle third of the main bile duct.       This was adjacent to the sludge/debris that was within the gallbladder       region as well. There is subsequent dilation at that region.      Pancreatic parenchymal abnormalities were noted in the genu of the       pancreas, pancreatic body and pancreatic tail. These consisted of       atrophy.      The pancreatic duct had a normal endosonographic appearance in the       pancreatic head (PD =1.1 mm -> 1.7 mm), genu of the pancreas (1.2 mm),       body of the pancreas (0.8 mm), and the tail of the pancreas (0.5 mm).      Endosonographic imaging of the ampulla showed no intramural       (subepithelial) lesion or mass.      A cyst was found in the left lobe of the liver and measured 28 mm by 26       mm in maximal cross-sectional diameter. The cyst was anechoic. It was       thinly septated.      Endosonographic imaging in the rest of the visualized portion of the       liver showed no lesion.      A small amount of fluid - ascites was visualized as an anechoic feature,       was found in the peritoneal cavity.      The celiac region was visualized. Impression:               EGD Impression:                           - No gross lesions in proximal esophagus.                            - Non-bleeding esophageal  ulcers in middle                            esophagus. Biopsied.                           - Salmon-colored mucosa suspicious for long-segment                            Barrett's esophagus and classified as Barrett's                            stage C0-M6 per [place] criteria. Biopsied to                            confirm diagnosis.                           - Mallory-Weiss tear that is healing at the distal                            GE [place]unction..                           - Z-line irregular, 36 cm from the incisors.                           - Medium-sized hiatal hernia.                           - No other gross lesions in the stomach. Biopsied.                           - Erosive duodenopathy.                           - Non-bleeding duodenal diverticulum.                           - Normal major papilla but is located entirely                            intradiverticular.                           - No gross lesions in the third portion of the                            duodenum.                           EUS Impression:                           - Hyperechoic material consistent with sludge and                            debris was visualized endosonographically  in the                            gallbladder. There is significant obscuring of the                            common hepatic duct region as a result of this.                           - There was dilation in the middle third of the                            main bile duct and there was evidence/concern for                            hyperechoic material consistent with sludge was                            visualized endosonographically in this region,                            however, the overt cystic duct insertion and then                            the development of the gallbladder obscuring                            shadowing defects makes things difficult to see,                             but sludge is noted.                           - Pancreatic parenchymal abnormalities consisting                            of atrophy were noted in the genu of the pancreas,                            pancreatic body and pancreatic tail. But the duct                            itself, had a normal endosonographic appearance in                            the pancreatic head, genu of the pancreas, body of                            the pancreas and tail of the pancreas.                           - A cyst was found in the left lobe of the liver  and measured 28 mm by 26 mm.                           - Ascites was found on endosonographic examination                            of the peritoneal cavity. Recommendation:           - The patient will be observed post-procedure,                            until all discharge criteria are met.                           - Return patient to hospital ward for ongoing care.                           - Observe patient's clinical course.                           - Regular Diet OK for today.                           - Will plan to proceed with HIDA to evaluate for                            evidence of cholecystitis and also to evaluate the                            biliary tree in regards to positioning of                            radiotracer into the duodenum for evaluation of                            partial obstruction or not.                           - Based on the patient's kidney function, and as he                            has not had any cross-sectional imaging in months,                            he needs this to be performed. Ideally would get a                            CT-Abdmen with IV contrast vs a MRI-Abdomen with                            contrast. If kidney function is stable and no plan                            for urgent catheterization then  would have medical                             team order scan on Saturday.                           - Reasonable on Saturday to discuss case with                            surgery as HIDA is being completed and his                            cross-sectional CT is performed.                           - Based on his liver function testing and the                            findings above will consider the role of                            cholecystectomy, cholecystostomy tube placement,                            and role of ERCP moving forward.                           - The findings and recommendations were discussed                            with the patient.                           - The findings and recommendations were discussed                            with the patient's family.                           - The findings and recommendations were discussed                            with the referring physician.                           - GI MD team covering over the weekend. Procedure Code(s):        --- Professional ---                           [phone], Esophagogastroduodenoscopy, flexible,                            transoral; with endoscopic ultrasound examination                            limited to the esophagus, stomach or  duodenum, and                            adjacent structures                           43239, 59, Esophagogastroduodenoscopy, flexible,                            transoral; with biopsy, single or multiple Diagnosis Code(s):        --- Professional ---                           K22.70, Barrett's esophagus without dysplasia                           K22.10, Ulcer of esophagus without bleeding                           K22.6, Gastro-esophageal laceration-hemorrhage                            syndrome                           K22.8, Other specified diseases of esophagus                           K44.9, Diaphragmatic hernia without obstruction or                            gangrene                            K31.89, Other diseases of stomach and duodenum                           K57.11, Diverticulosis of small intestine without                            perforation or abscess with bleeding                           K83.8, Other specified diseases of biliary tract                           K86.9, Disease of pancreas, unspecified                           D62, Acute posthemorrhagic anemia                           K92.0, Hematemesis                           R93.5, Abnormal findings on diagnostic imaging of                            other abdominal  regions, including retroperitoneum CPT copyright 2018 [employer]. All rights reserved. The codes documented in this report are preliminary and upon coder review may  be revised to meet current compliance requirements. Justice Britain, MD 07/05/2018 6:42:24 PM Number of Addenda: 0

## 2018-07-05 NOTE — Progress Notes (Signed)
Patient ID: Ernest Parker, male   DOB: [birth year], 84 y.o.   MRN: [id number]  PROGRESS NOTE    Ernest Parker  VZD:[id number] DOB: [birth year] DOA: 07/01/2018 PCP: Seward Carol, MD   Brief Narrative:  84 year old male with history of A. fib on Eliquis, status post pacemaker in 09/2017, remote prostate cancer, hypertension, hyperlipidemia presented with  unresponsiveness.  CT of the head was negative for bleed.  Troponin was mildly elevated.  Cardiology was consulted.  He was started on IV fluids for AKI.  LFTs were mildly elevated.  GI was consulted. He was planned for cardiac catheterization on 07/04/2018. Patient had chills on 07/03/2018 and went into A. fib with RVR.  He was started on empiric antibiotics and had to be placed on Cardizem drip.   Assessment & Plan:   Principal Problem:   Syncope and collapse Active Problems:   Atrial fibrillation with rapid ventricular response (HCC)   Essential hypertension   History of prostate cancer   Elevated LFTs   CKD (chronic kidney disease) stage 3, GFR 30-59 ml/min (HCC)   PAF (paroxysmal atrial fibrillation) (HCC)   Elevated troponin  E. coli bacteremia -Possibly from urinary tract infection.  Cannot rule out biliary source. -Urine culture is growing Klebsiella pneumoniae and Enterococcus faecalis though.  Foley catheter was changed on 07/03/2018.  Patient had chills on 07/03/2018 and blood cultures drawn at that time is positive for E. coli.  Blood cultures on admission was negative. -We will repeat blood cultures on 07/06/2018 to see if the bacteremia is clearing.  If there is persistent bacteremia, there might be a concern for pacemaker infection as well. -Levaquin was switched to IV aztreonam on 07/04/2018.  We will switch patient back to Levaquin as urine culture is growing Enterococcus faecalis as well sensitive to Levaquin.  UTI -Probably associated with chronic indwelling Foley catheter. -Probably evolving on admission.  Urine  culture done at the time of admission from the indwelling Foley catheter was positive for Klebsiella pneumoniae and Enterococcus faecalis.  Foley catheter was changed on 07/03/2018.  Repeat urine cultures from 07/03/2018 is growing the same and organisms  Syncope with elevated troponins -Unclear etiology.  Had prolonged confusion for 20 to 30 minutes -Might be related to orthostatic hypotension: Lasix on hold.  Continue IV fluids -Echo showed EF of 55 to 60% -Cardiology had planned for cardiac catheterization for today which will be held because of bacteremia.  Follow further cardiology recommendations  Paroxysmal atrial fibrillation/flutter with rapid ventricular rate; bradycardia status post pacemaker in 1/19 -And had to be put on Cardizem drip overnight of 07/03/2018.  Currently rate controlled.  On oral Cardizem and beta-blocker.  Outpatient follow-up with cardiology.  Heparin drip discontinued because of GI bleed.  Will wait for further GI recommendations before starting Eliquis.  Probable upper GI bleeding -GI following.  Continue Protonix.  For EGD today.  Hemoglobin stable.  Elevated LFTs -Probably from hypoperfusion.  Improving. -Right upper quadrant ultrasound showed cholelithiasis without cholecystitis  Abnormal gallbladder -Ultrasound showed possible underlying malignant process versus sludge.  Patient will require CT abdomen with IV contrast versus MRI/MRCP with contrast.  This cannot be done at this time because of renal function.  Will wait for renal function to improve prior to proceeding with imaging studies.  Acute kidney injury on chronic kidney disease stage III -Creatinine is improving at 1.87.  Baseline creatinine is around 1.7 -Lasix on hold.  Repeat a.m. creatinine.    Chronic urinary retention with chronic indwelling  Foley's catheter -Outpatient follow-up with urology.  Urology had recommended to replace Foley's catheter while inpatient.  Foley catheter was changed  on 06/30/2018.  Thrombocytopenia -Questionable cause.  Monitor  DVT prophylaxis: Heparin discontinued because of probable upper GI bleeding Code Status: DNR Family Communication: Daughter  at bedside Disposition Plan: Depends on clinical outcome  Consultants: Cardiology/GI  Procedures:  Echo Study Conclusions  - Left ventricle: The cavity size was normal. There was moderate   concentric hypertrophy. Systolic function was normal. The   estimated ejection fraction was in the range of 55% to 60%. Wall   motion was normal; there were no regional wall motion   abnormalities. - Aortic valve: There was trivial regurgitation. - Mitral valve: Mildly calcified annulus. There was mild   regurgitation. - Left atrium: The atrium was moderately dilated.  Antimicrobials: Levaquin   Subjective: Patient seen and examined at bedside.  Patient denies any worsening cough, chest pain, nausea or vomiting.  No worsening abdominal pain   objective: Vitals:   07/05/18 0548 07/05/18 0954 07/05/18 1256 07/05/18 1303  BP: 118/77 129/85  (!) 169/92  Pulse: (!) 42 95  (!) 105  Resp: 20 18  (!) 25  Temp: 98.1 F (36.7 C)   98.9 F (37.2 C)  TempSrc: Oral  Oral Oral  SpO2: 94% 95%  94%  Weight: 101.2 kg     Height:        Intake/Output Summary (Last 24 hours) at 07/05/2018 1317 Last data filed at 07/05/2018 1300 Gross per 24 hour  Intake 3112.2 ml  Output 1575 ml  Net 1537.2 ml   Filed Weights   07/03/18 0614 07/04/18 0522 07/05/18 0548  Weight: 97.4 kg 99.8 kg 101.2 kg    Examination:  General exam: Appears calm and comfortable, no acute distress Respiratory system: Bilateral decreased breath sounds at bases, with scattered crackles.  No wheezing Cardiovascular system: S1 & S2 heard, rate controlled currently  gastrointestinal system: Abdomen is nondistended, soft and nontender. Normal bowel sounds heard. Extremities: No cyanosis, edema   Data Reviewed: I have personally reviewed  following labs and imaging studies  CBC: Recent Labs  Lab 07/01/18 0552 07/02/18 0050 07/03/18 0548 07/03/18 1958 07/04/18 0505 07/04/18 1632 07/05/18 0627  WBC 14.5* 11.7* 6.5  --  11.3*  --  7.3  NEUTROABS  --   --  5.4  --  10.0*  --  5.7  HGB 11.8* 10.4* 10.9* 10.7* 9.6* 8.6* 9.3*  HCT 37.0* 32.7* 33.3* 32.7* 30.8* 28.0* 30.7*  MCV 90.5 89.8 89.3  --  91.9  --  92.7  PLT 182 127* 120*  --  97*  --  98*   Basic Metabolic Panel: Recent Labs  Lab 07/01/18 0710 07/02/18 0050 07/03/18 0548 07/04/18 0505 07/05/18 0627  NA 135 135 136 136 140  K 3.9 3.3* 3.5 4.1 3.9  CL 104 106 104 104 108  CO2 19* 22 23 22 23   GLUCOSE 180* 161* 119* 172* 106*  BUN 25* 39* 30* 33* 31*  CREATININE 2.12* 2.45* 1.79* 2.14* 1.87*  CALCIUM 8.4* 8.5* 8.4* 8.0* 8.4*  MG  --   --   --  1.9 2.2   GFR: Estimated Creatinine Clearance: 36.3 mL/min (A) (by C-G formula based on SCr of 1.87 mg/dL (H)). Liver Function Tests: Recent Labs  Lab 07/01/18 1647 07/02/18 0050 07/03/18 0548 07/04/18 0505 07/05/18 0627  AST 263* 184* 65* 43* 38  ALT 453* 345* 191* 123* 95*  ALKPHOS 278* 219* 177*  153* 152*  BILITOT 6.1* 4.8* 3.2* 3.2* 3.7*  PROT 6.6 6.0* 5.8* 5.5* 5.5*  ALBUMIN 2.9* 2.7* 2.3* 2.2* 2.2*   No results for input(s): LIPASE, AMYLASE in the last 168 hours. Recent Labs  Lab 07/01/18 1445 07/01/18 2059  AMMONIA 59* 41*   Coagulation Profile: Recent Labs  Lab 07/01/18 1647 07/02/18 0050 07/03/18 0548  INR 1.13 1.43 1.11   Cardiac Enzymes: Recent Labs  Lab 07/01/18 1445 07/01/18 2059 07/02/18 0050 07/02/18 0748 07/02/18 1435 07/02/18 1903  CKTOTAL  --  196  --   --   --   --   TROPONINI 0.89*  --  0.93* 0.52* 0.37* 0.30*   BNP (last 3 results) No results for input(s): PROBNP in the last 8760 hours. HbA1C: No results for input(s): HGBA1C in the last 72 hours. CBG: Recent Labs  Lab 07/04/18 1203 07/04/18 1700 07/04/18 2126 07/05/18 0818 07/05/18 1122  GLUCAP 133*  106* 132* 95 92   Lipid Profile: No results for input(s): CHOL, HDL, LDLCALC, TRIG, CHOLHDL, LDLDIRECT in the last 72 hours. Thyroid Function Tests: No results for input(s): TSH, T4TOTAL, FREET4, T3FREE, THYROIDAB in the last 72 hours. Anemia Panel: No results for input(s): VITAMINB12, FOLATE, FERRITIN, TIBC, IRON, RETICCTPCT in the last 72 hours. Sepsis Labs: No results for input(s): PROCALCITON, LATICACIDVEN in the last 168 hours.  Recent Results (from the past 240 hour(s))  Culture, Urine     Status: Abnormal   Collection Time: 07/02/18  7:37 AM  Result Value Ref Range Status   Specimen Description URINE, RANDOM  Final   Special Requests   Final    NONE Performed at [facility], [address] [place], [place] [address]    Culture (A)  Final    >=100,000 COLONIES/mL KLEBSIELLA PNEUMONIAE 40,000 COLONIES/mL ENTEROCOCCUS FAECALIS    Report Status 07/05/2018 FINAL  Final   Organism ID, Bacteria KLEBSIELLA PNEUMONIAE (A)  Final   Organism ID, Bacteria ENTEROCOCCUS FAECALIS (A)  Final      Susceptibility   Enterococcus faecalis - MIC*    AMPICILLIN <=2 SENSITIVE Sensitive     LEVOFLOXACIN 0.5 SENSITIVE Sensitive     NITROFURANTOIN <=16 SENSITIVE Sensitive     VANCOMYCIN <=0.5 SENSITIVE Sensitive     * 40,000 COLONIES/mL ENTEROCOCCUS FAECALIS   Klebsiella pneumoniae - MIC*    AMPICILLIN >=32 RESISTANT Resistant     CEFAZOLIN <=4 SENSITIVE Sensitive     CEFTRIAXONE <=1 SENSITIVE Sensitive     CIPROFLOXACIN <=0.25 SENSITIVE Sensitive     GENTAMICIN <=1 SENSITIVE Sensitive     IMIPENEM <=0.25 SENSITIVE Sensitive     NITROFURANTOIN 128 RESISTANT Resistant     TRIMETH/SULFA <=20 SENSITIVE Sensitive     AMPICILLIN/SULBACTAM 16 INTERMEDIATE Intermediate     PIP/TAZO 16 SENSITIVE Sensitive     Extended ESBL NEGATIVE Sensitive     * >=100,000 COLONIES/mL KLEBSIELLA PNEUMONIAE  Culture, blood (routine x 2)     Status: None (Preliminary result)   Collection Time: 07/02/18  10:45 PM  Result Value Ref Range Status   Specimen Description BLOOD LEFT ANTECUBITAL  Final   Special Requests   Final    BOTTLES DRAWN AEROBIC ONLY Blood Culture results may not be optimal due to an inadequate volume of blood received in culture bottles   Culture   Final    NO GROWTH 3 DAYS Performed at [facility], [address], [place], [place] [address]    Report Status PENDING  Incomplete  Culture,  blood (routine x 2)     Status: None (Preliminary result)   Collection Time: 07/02/18 10:51 PM  Result Value Ref Range Status   Specimen Description BLOOD LEFT FOREARM  Final   Special Requests   Final    BOTTLES DRAWN AEROBIC AND ANAEROBIC Blood Culture results may not be optimal due to an inadequate volume of blood received in culture bottles   Culture   Final    NO GROWTH 3 DAYS Performed at [facility], [place] [address] [place], [place] [address]    Report Status PENDING  Incomplete  Culture, blood (routine x 2)     Status: Abnormal (Preliminary result)   Collection Time: 07/03/18  4:15 PM  Result Value Ref Range Status   Specimen Description BLOOD RIGHT ANTECUBITAL  Final   Special Requests   Final    BOTTLES DRAWN AEROBIC AND ANAEROBIC Blood Culture adequate volume   Culture  Setup Time   Final    GRAM NEGATIVE RODS IN BOTH AEROBIC AND ANAEROBIC BOTTLES CRITICAL RESULT CALLED TO, READ BACK BY AND VERIFIED WITH: Zoila Shutter [phone] Performed at [facility], [place] [address], [place], [place] [address]    Culture ESCHERICHIA COLI (A)  Final   Report Status PENDING  Incomplete  Blood Culture ID Panel (Reflexed)     Status: Abnormal   Collection Time: 07/03/18  4:15 PM  Result Value Ref Range Status   Enterococcus species NOT DETECTED NOT DETECTED Final   Listeria monocytogenes NOT DETECTED NOT DETECTED Final   Staphylococcus species NOT DETECTED NOT DETECTED Final   Staphylococcus aureus (BCID) NOT DETECTED NOT DETECTED Final   Streptococcus  species NOT DETECTED NOT DETECTED Final   Streptococcus agalactiae NOT DETECTED NOT DETECTED Final   Streptococcus pneumoniae NOT DETECTED NOT DETECTED Final   Streptococcus pyogenes NOT DETECTED NOT DETECTED Final   Acinetobacter baumannii NOT DETECTED NOT DETECTED Final   Enterobacteriaceae species DETECTED (A) NOT DETECTED Final    Comment: Enterobacteriaceae represent a large family of gram-negative bacteria, not a single organism. CRITICAL RESULT CALLED TO, READ BACK BY AND VERIFIED WITH: PHARMD V BRYK [phone] MLM    Enterobacter cloacae complex NOT DETECTED NOT DETECTED Final   Escherichia coli DETECTED (A) NOT DETECTED Final    Comment: CRITICAL RESULT CALLED TO, READ BACK BY AND VERIFIED WITH: PHARMD V BRYK [phone] MLM    Klebsiella oxytoca NOT DETECTED NOT DETECTED Final   Klebsiella pneumoniae NOT DETECTED NOT DETECTED Final   Proteus species NOT DETECTED NOT DETECTED Final   Serratia marcescens NOT DETECTED NOT DETECTED Final   Carbapenem resistance NOT DETECTED NOT DETECTED Final   Haemophilus influenzae NOT DETECTED NOT DETECTED Final   Neisseria meningitidis NOT DETECTED NOT DETECTED Final   Pseudomonas aeruginosa NOT DETECTED NOT DETECTED Final   Candida albicans NOT DETECTED NOT DETECTED Final   Candida glabrata NOT DETECTED NOT DETECTED Final   Candida krusei NOT DETECTED NOT DETECTED Final   Candida parapsilosis NOT DETECTED NOT DETECTED Final   Candida tropicalis NOT DETECTED NOT DETECTED Final    Comment: Performed at [facility], [place] [address], [place], [place] [address]  Culture, blood (routine x 2)     Status: None (Preliminary result)   Collection Time: 07/03/18  4:30 PM  Result Value Ref Range Status   Specimen Description BLOOD LEFT HAND  Final   Special Requests   Final    BOTTLES DRAWN AEROBIC AND ANAEROBIC  Blood Culture adequate volume   Culture  Setup Time   Final    GRAM NEGATIVE RODS IN BOTH AEROBIC AND ANAEROBIC BOTTLES CRITICAL  VALUE NOTED.  VALUE IS CONSISTENT WITH PREVIOUSLY REPORTED AND CALLED VALUE. Performed at [facility], [place] [address], [place], [place] [address]    Culture GRAM NEGATIVE RODS  Final   Report Status PENDING  Incomplete  Culture, Urine     Status: Abnormal (Preliminary result)   Collection Time: 07/03/18  5:08 PM  Result Value Ref Range Status   Specimen Description URINE, CATHETERIZED  Final   Special Requests NONE  Final   Culture (A)  Final    >=100,000 COLONIES/mL KLEBSIELLA PNEUMONIAE >=100,000 COLONIES/mL ENTEROCOCCUS FAECALIS SUSCEPTIBILITIES TO FOLLOW REPEATING FOR ORGANISM 2. CULTURE REINCUBATED FOR BETTER GROWTH Performed at [facility], [place] [address] [place], [place] [address]    Report Status PENDING  Incomplete   Organism ID, Bacteria KLEBSIELLA PNEUMONIAE (A)  Final      Susceptibility   Klebsiella pneumoniae - MIC*    AMPICILLIN >=32 RESISTANT Resistant     CEFAZOLIN <=4 SENSITIVE Sensitive     CEFTRIAXONE <=1 SENSITIVE Sensitive     CIPROFLOXACIN <=0.25 SENSITIVE Sensitive     GENTAMICIN <=1 SENSITIVE Sensitive     IMIPENEM <=0.25 SENSITIVE Sensitive     NITROFURANTOIN 64 INTERMEDIATE Intermediate     TRIMETH/SULFA <=20 SENSITIVE Sensitive     AMPICILLIN/SULBACTAM 16 INTERMEDIATE Intermediate     PIP/TAZO 16 SENSITIVE Sensitive     Extended ESBL NEGATIVE Sensitive     * >=100,000 COLONIES/mL KLEBSIELLA PNEUMONIAE         Radiology Studies: Dg Abd 1 View  Result Date: 07/03/2018 CLINICAL DATA:  84 year old male with vomiting and diarrhea. EXAM: ABDOMEN - 1 VIEW COMPARISON:  Chest radiographs 07/01/2018. FINDINGS: Portable AP supine view at 1850 hours. Bifurcated abdominal aortic endograft. Visualized bowel gas pattern is non obstructed. No pneumoperitoneum identified on this supine view. Small surgical clips and mesh type fast nerves project in the pelvis. No acute osseous abnormality identified. IMPRESSION: Normal visible bowel gas pattern.  Abdominal aortic endograft in place. Electronically Signed   By: Genevie Ann M.D.   On: 07/03/2018 19:05        Scheduled Meds: . [MAR Hold] diltiazem  120 mg Oral Daily  . [MAR Hold] docusate sodium  100 mg Oral BID  . [MAR Hold] insulin aspart  0-9 Units Subcutaneous TID WC  . [MAR Hold] metoprolol succinate  25 mg Oral BID  . [MAR Hold] pantoprazole  40 mg Oral BID  . [MAR Hold] sodium chloride flush  3 mL Intravenous Q12H  . sodium chloride flush  3 mL Intravenous Q12H  . [MAR Hold] tamsulosin  0.4 mg Oral BID   Continuous Infusions: . sodium chloride    . sodium chloride Stopped (07/05/18 1155)  . diltiazem (CARDIZEM) infusion Stopped (07/04/18 0945)  . [MAR Hold] levofloxacin (LEVAQUIN) IV 500 mg (07/05/18 1155)     LOS: 3 days        Aline August, MD [facility] Pager [phone]  If 7PM-7AM, please contact night-coverage [email or web address] Password [email or web address] 07/05/2018, 1:17 PM

## 2018-07-05 NOTE — Anesthesia Preprocedure Evaluation (Addendum)
Anesthesia Evaluation  Patient identified by MRN, date of birth, ID band Patient awake    Reviewed: Allergy & Precautions, NPO status , Patient's Chart, lab work & pertinent test results, reviewed documented beta blocker date and time   Airway Mallampati: II  TM Distance: >3 FB Neck ROM: Full    Dental  (+) Poor Dentition   Pulmonary shortness of breath, with exertion and at rest, former smoker,    Pulmonary exam normal breath sounds clear to auscultation       Cardiovascular hypertension, Pt. on medications and Pt. on home beta blockers Normal cardiovascular exam+ dysrhythmias Atrial Fibrillation + pacemaker  Rhythm:Regular Rate:Normal  Slight bump in Troponins on admission ? NSTEMI AAA S/P endovascular stent 02/2018 [place]  Echo 07/02/2018-Left ventricle: The cavity size was normal. There was moderate concentric hypertrophy. Systolic function was normal. The estimated ejection fraction was in the range of 55% to 60%. Wall motion was normal; there were no regional wall motion abnormalities. - Aortic valve: There was trivial regurgitation. - Mitral valve: Mildly calcified annulus. There was mild   regurgitation. - Left atrium: The atrium was moderately dilated.  EKG 07/02/2018- A. Fib, LPFB   Neuro/Psych TIAnegative psych ROS   GI/Hepatic GERD  Medicated and Controlled,Elevated LFT's coming down now Gi Bleeding   Endo/Other  Obesity  Renal/GU Renal InsufficiencyRenal disease   Prostate Ca S/P RT after TURP Chronic indwelling foley catheter- Urosepsis this admission    Musculoskeletal negative musculoskeletal ROS (+)   Abdominal (+) + obese,   Peds  Hematology  (+) anemia , Thrombocytopenia Chronic anticoagulation- Eliquis last dose 3 days ago- on heparin bridge currently   Anesthesia Other Findings   Reproductive/Obstetrics                          Anesthesia Physical Anesthesia  Plan  ASA: IV  Anesthesia Plan: MAC   Post-op Pain Management:    Induction:   PONV Risk Score and Plan: Treatment may vary due to age or medical condition, Propofol infusion and Ondansetron  Airway Management Planned: Natural Airway and Nasal Cannula  Additional Equipment:   Intra-op Plan:   Post-operative Plan:   Informed Consent: I have reviewed the patients History and Physical, chart, labs and discussed the procedure including the risks, benefits and alternatives for the proposed anesthesia with the patient or authorized representative who has indicated his/her understanding and acceptance.     Plan Discussed with: CRNA and Surgeon  Anesthesia Plan Comments:         Anesthesia Quick Evaluation

## 2018-07-05 NOTE — Interval H&P Note (Signed)
History and Physical Interval Note:  07/05/2018 1:36 PM  Ernest Parker  has presented today for surgery, with the diagnosis of CGE.  anemia.  The various methods of treatment have been discussed with the patient and family. After consideration of risks, benefits and other options for treatment, the patient has consented to  Procedure(s): ESOPHAGOGASTRODUODENOSCOPY (EGD) WITH PROPOFOL (N/A) UPPER ENDOSCOPIC ULTRASOUND (EUS) LINEAR (N/A) as a surgical intervention .  The patient's history has been reviewed, patient examined, no change in status, stable for surgery.  I have reviewed the patient's chart and labs.  Questions were answered to the patient's satisfaction.    The risks of EUS including bleeding, infection, aspiration pneumonia and intestinal perforation were discussed as was the possibility it may not give a definitive diagnosis.  If a biopsy of the pancreas is done as part of the EUS, there is an additional risk of pancreatitis at the rate of about 1%.  It was explained that procedure related pancreatitis is typically mild, although can be severe and even life threatening, which is why we do not perform random pancreatic biopsies and only biopsy a lesion we feel is concerning enough to warrant the risk.   The risks and benefits of endoscopic evaluation were discussed with the patient; these include but are not limited to the risk of perforation, infection, bleeding, missed lesions, lack of diagnosis, severe illness requiring hospitalization, as well as anesthesia and sedation related illnesses.  The patient is agreeable to proceed.    [employer]

## 2018-07-05 NOTE — Progress Notes (Addendum)
Progress Note  Patient Name: Ernest Parker Date of Encounter: 07/05/2018  Primary Cardiologist: [name], MD   Adrian from a cardiac standpoint. No palpitations, CP and dyspnea.   Inpatient Medications    Scheduled Meds: . diltiazem  120 mg Oral Daily  . docusate sodium  100 mg Oral BID  . insulin aspart  0-9 Units Subcutaneous TID WC  . metoprolol succinate  25 mg Oral BID  . pantoprazole  40 mg Oral BID  . sodium chloride flush  3 mL Intravenous Q12H  . sodium chloride flush  3 mL Intravenous Q12H  . tamsulosin  0.4 mg Oral BID   Continuous Infusions: . sodium chloride    . sodium chloride Stopped (07/05/18 0506)  . aztreonam 200 mL/hr at 07/05/18 0512  . diltiazem (CARDIZEM) infusion Stopped (07/04/18 0945)   PRN Meds: sodium chloride, acetaminophen **OR** acetaminophen, metoprolol tartrate, morphine injection, ondansetron **OR** ondansetron (ZOFRAN) IV, sodium chloride flush   Vital Signs    Vitals:   07/04/18 1800 07/04/18 2034 07/04/18 2127 07/05/18 0548  BP:  110/79 99/87 118/77  Pulse: 85 88 94 (!) 42  Resp: 16 18  20   Temp:  98.3 F (36.8 C)  98.1 F (36.7 C)  TempSrc:  Oral  Oral  SpO2:  94%  94%  Weight:    101.2 kg  Height:        Intake/Output Summary (Last 24 hours) at 07/05/2018 0914 Last data filed at 07/05/2018 0512 Gross per 24 hour  Intake 2661.57 ml  Output 676 ml  Net 1985.57 ml   Filed Weights   07/03/18 0614 07/04/18 0522 07/05/18 0548  Weight: 97.4 kg 99.8 kg 101.2 kg    Telemetry    afib 80s-90s, paced - Personally Reviewed  ECG    Not performed today.  - Personally Reviewed  Physical Exam   GEN: elderly WM in No acute distress.   Neck: No JVD Cardiac: irregular rhythm, regular rate, no murmurs, rubs, or gallops.  Respiratory: Clear to auscultation bilaterally. GI: Soft, nontender, non-distended  MS: No edema; No deformity. Neuro:  Nonfocal  Psych: Normal affect  Uro: chronic indwelling foley  present  Labs    Chemistry Recent Labs  Lab 07/03/18 0548 07/04/18 0505 07/05/18 0627  NA 136 136 140  K 3.5 4.1 3.9  CL 104 104 108  CO2 23 22 23   GLUCOSE 119* 172* 106*  BUN 30* 33* 31*  CREATININE 1.79* 2.14* 1.87*  CALCIUM 8.4* 8.0* 8.4*  PROT 5.8* 5.5* 5.5*  ALBUMIN 2.3* 2.2* 2.2*  AST 65* 43* 38  ALT 191* 123* 95*  ALKPHOS 177* 153* 152*  BILITOT 3.2* 3.2* 3.7*  GFRNONAA 34* 27* 32*  GFRAA 39* 31* 37*  ANIONGAP 9 10 9      Hematology Recent Labs  Lab 07/03/18 0548  07/04/18 0505 07/04/18 1632 07/05/18 0627  WBC 6.5  --  11.3*  --  7.3  RBC 3.73*  --  3.35*  --  3.31*  HGB 10.9*   < > 9.6* 8.6* 9.3*  HCT 33.3*   < > 30.8* 28.0* 30.7*  MCV 89.3  --  91.9  --  92.7  MCH 29.2  --  28.7  --  28.1  MCHC 32.7  --  31.2  --  30.3  RDW 15.1  --  15.3  --  15.5  PLT 120*  --  97*  --  98*   < > = values in this  interval not displayed.    Cardiac Enzymes Recent Labs  Lab 07/02/18 0050 07/02/18 0748 07/02/18 1435 07/02/18 1903  TROPONINI 0.93* 0.52* 0.37* 0.30*   No results for input(s): TROPIPOC in the last 168 hours.   BNPNo results for input(s): BNP, PROBNP in the last 168 hours.   DDimer No results for input(s): DDIMER in the last 168 hours.   Radiology    Dg Abd 1 View  Result Date: 07/03/2018 CLINICAL DATA:  84 year old male with vomiting and diarrhea. EXAM: ABDOMEN - 1 VIEW COMPARISON:  Chest radiographs 07/01/2018. FINDINGS: Portable AP supine view at 1850 hours. Bifurcated abdominal aortic endograft. Visualized bowel gas pattern is non obstructed. No pneumoperitoneum identified on this supine view. Small surgical clips and mesh type fast nerves project in the pelvis. No acute osseous abnormality identified. IMPRESSION: Normal visible bowel gas pattern. Abdominal aortic endograft in place. Electronically Signed   By: Genevie Ann M.D.   On: 07/03/2018 19:05    Cardiac Studies   2D Echo 07/02/18 Study Conclusions  - Left ventricle: The cavity  size was normal. There was moderate   concentric hypertrophy. Systolic function was normal. The   estimated ejection fraction was in the range of 55% to 60%. Wall   motion was normal; there were no regional wall motion   abnormalities. - Aortic valve: There was trivial regurgitation. - Mitral valve: Mildly calcified annulus. There was mild   regurgitation. - Left atrium: The atrium was moderately dilated.   Patient Profile     84 y.o. male with, permanent atrial fibrillation, pacemaker with gram-negative rod bacteremia, chronic indwelling Foley with elevated troponin likely type II myocardial infarction/demand ischemia, syncope/orthostasis from prerenal azotemia and infection.  Assessment & Plan   1. Permanent Atrial Fibrillation: doing ok on PO Cardizem and metoprolol. Rates in the 80s-90s and asymptomatic. Was on Eliquis for a/c at home but discontinued given concerns for possible GIB. Continue to monitor on tele. Continue to treat bacteremia.   2. Gram-negative bacteremia: Klebsiella likely given chronic indwelling foley. He is on IV antibiotics. Continue management per IM.   3. Elevated Troponin: 0.37>>0.30. Echo 10/22 showed normal LVEF and no WMA. Suspect likely type II myocardial infarction in the setting of significant hypotension/infection. No plans for cardiac cath at this time due to infection.   4. 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 Followed by Dr. Lovena Le. Device functioning well and appropriately. He has however bacteremia and being treated with IV antibiotics. Hopefully infection will clear. If there is however persistent bacteremia despite IV antibiotics, will need to r/o pacemaker infection.  5. Renal insufficiency: SCr improved since yesterday, down from 2.14>>1.87.   For questions or updates, please contact [place] Please consult [email or web address] for contact info under         Signed, Lyda Jester, PA-C  07/05/2018, 9:14 AM    Personally seen and examined. Agree with above. Given his gram-negative bacteremia, at this point, I would not pursue cardiac catheterization.  We can always reconsider ischemic evaluation as an outpatient.  Gram-negative rod would be an unusual organism for endocarditis.  Continue with IV antibiotics.  He is at risk for recurrent Klebsiella infection based upon his indwelling chronic Foley.  At this time, his permanent atrial fibrillation is well controlled on both Cardizem and metoprolol with heart irregularly irregular normal rate lungs are clear no edema no significant abdominal discomfort, lungs are clear.  Creatinine almost at baseline 1.87.  At this point, we will go ahead and sign off.  [facility] will sign off.   Medication Recommendations:  No new Other recommendations (labs, testing, etc): Consider further ischemic testing as outpatient Follow up as an outpatient: In approximately 1 month.  Candee Furbish, MD

## 2018-07-05 NOTE — Transfer of Care (Signed)
Immediate Anesthesia Transfer of Care Note  Patient: Ernest Parker  Procedure(s) Performed: ESOPHAGOGASTRODUODENOSCOPY (EGD) WITH PROPOFOL (N/A ) UPPER ENDOSCOPIC ULTRASOUND (EUS) LINEAR (N/A ) BIOPSY  Patient Location: PACU and Endoscopy Unit  Anesthesia Type:MAC  Level of Consciousness: awake, drowsy and patient cooperative  Airway & Oxygen Therapy: Patient Spontanous Breathing and Patient connected to nasal cannula oxygen  Post-op Assessment: Report given to RN and Post -op Vital signs reviewed and stable  Post vital signs: Reviewed and stable  Last Vitals:  Vitals Value Taken Time  BP 84/46 07/05/2018  2:49 PM  Temp    Pulse 105 07/05/2018  2:49 PM  Resp 18 07/05/2018  2:49 PM  SpO2 94 % 07/05/2018  2:49 PM  Vitals shown include unvalidated device data.  Last Pain:  Vitals:   07/05/18 1303  TempSrc: Oral  PainSc: 0-No pain         Complications: No apparent anesthesia complications

## 2018-07-05 NOTE — Progress Notes (Signed)
Paged MD Harlen Labs K about diet order. Family and patient stating patient is ready to eat and hungry  Venetia Night, RN

## 2018-07-05 NOTE — Anesthesia Postprocedure Evaluation (Signed)
Anesthesia Post Note  Patient: MIROSLAV GIN  Procedure(s) Performed: ESOPHAGOGASTRODUODENOSCOPY (EGD) WITH PROPOFOL (N/A ) UPPER ENDOSCOPIC ULTRASOUND (EUS) LINEAR (N/A ) BIOPSY     Patient location during evaluation: PACU Anesthesia Type: MAC Level of consciousness: awake and alert and oriented Pain management: pain level controlled Vital Signs Assessment: post-procedure vital signs reviewed and stable Respiratory status: spontaneous breathing, nonlabored ventilation and respiratory function stable Cardiovascular status: stable and blood pressure returned to baseline Postop Assessment: no apparent nausea or vomiting Anesthetic complications: no    Last Vitals:  Vitals:   07/05/18 1453 07/05/18 1500  BP: (!) 98/53 (!) 83/58  Pulse: 78 (!) 49  Resp: (!) 21 (!) 21  Temp:    SpO2: 94% 94%    Last Pain:  Vitals:   07/05/18 1500  TempSrc:   PainSc: 0-No pain                 Shakea Isip A.

## 2018-07-05 NOTE — Progress Notes (Signed)
Endoscopy contacted me about patient and today's procedure. Per endoscopy ok to give morning med's with small sip of water since he is going for procedure at 1 pm.  Venetia Night, RN

## 2018-07-06 ENCOUNTER — Inpatient Hospital Stay (HOSPITAL_COMMUNITY): Payer: Medicare Other

## 2018-07-06 DIAGNOSIS — R7881 Bacteremia: Secondary | ICD-10-CM

## 2018-07-06 DIAGNOSIS — K226 Gastro-esophageal laceration-hemorrhage syndrome: Secondary | ICD-10-CM

## 2018-07-06 DIAGNOSIS — K7460 Unspecified cirrhosis of liver: Secondary | ICD-10-CM

## 2018-07-06 DIAGNOSIS — B9620 Unspecified Escherichia coli [E. coli] as the cause of diseases classified elsewhere: Secondary | ICD-10-CM

## 2018-07-06 DIAGNOSIS — R932 Abnormal findings on diagnostic imaging of liver and biliary tract: Secondary | ICD-10-CM

## 2018-07-06 DIAGNOSIS — K922 Gastrointestinal hemorrhage, unspecified: Secondary | ICD-10-CM

## 2018-07-06 LAB — COMPREHENSIVE METABOLIC PANEL
ALT: 79 U/L — ABNORMAL HIGH (ref 0–44)
ANION GAP: 8 (ref 5–15)
AST: 45 U/L — ABNORMAL HIGH (ref 15–41)
Albumin: 2 g/dL — ABNORMAL LOW (ref 3.5–5.0)
Alkaline Phosphatase: 193 U/L — ABNORMAL HIGH (ref 38–126)
BILIRUBIN TOTAL: 4.2 mg/dL — AB (ref 0.3–1.2)
BUN: 24 mg/dL — ABNORMAL HIGH (ref 8–23)
CHLORIDE: 109 mmol/L (ref 98–111)
CO2: 21 mmol/L — ABNORMAL LOW (ref 22–32)
Calcium: 8.1 mg/dL — ABNORMAL LOW (ref 8.9–10.3)
Creatinine, Ser: 1.67 mg/dL — ABNORMAL HIGH (ref 0.61–1.24)
GFR, EST AFRICAN AMERICAN: 42 mL/min — AB (ref >60)
GFR, EST NON AFRICAN AMERICAN: 37 mL/min — AB (ref >60)
Glucose, Bld: 102 mg/dL — ABNORMAL HIGH (ref 70–99)
POTASSIUM: 3.7 mmol/L (ref 3.5–5.1)
Sodium: 138 mmol/L (ref 135–145)
TOTAL PROTEIN: 5.2 g/dL — AB (ref 6.5–8.1)

## 2018-07-06 LAB — CBC WITH DIFFERENTIAL/PLATELET
Abs Immature Granulocytes: 0.09 10*3/uL — ABNORMAL HIGH (ref 0.00–0.07)
BASOS PCT: 0 %
Basophils Absolute: 0 10*3/uL (ref 0.0–0.1)
EOS ABS: 0 10*3/uL (ref 0.0–0.5)
EOS PCT: 1 %
HEMATOCRIT: 30.1 % — AB (ref 39.0–52.0)
Hemoglobin: 9.3 g/dL — ABNORMAL LOW (ref 13.0–17.0)
Immature Granulocytes: 2 %
Lymphocytes Relative: 11 %
Lymphs Abs: 0.6 10*3/uL — ABNORMAL LOW (ref 0.7–4.0)
MCH: 28.2 pg (ref 26.0–34.0)
MCHC: 30.9 g/dL (ref 30.0–36.0)
MCV: 91.2 fL (ref 80.0–100.0)
MONO ABS: 0.4 10*3/uL (ref 0.1–1.0)
MONOS PCT: 8 %
Neutro Abs: 4.5 10*3/uL (ref 1.7–7.7)
Neutrophils Relative %: 78 %
PLATELETS: 109 10*3/uL — AB (ref 150–400)
RBC: 3.3 MIL/uL — ABNORMAL LOW (ref 4.22–5.81)
RDW: 15.7 % — AB (ref 11.5–15.5)
WBC: 5.7 10*3/uL (ref 4.0–10.5)
nRBC: 0 % (ref 0.0–0.2)

## 2018-07-06 LAB — GLUCOSE, CAPILLARY
GLUCOSE-CAPILLARY: 99 mg/dL (ref 70–99)
GLUCOSE-CAPILLARY: 88 mg/dL (ref 70–99)
Glucose-Capillary: 120 mg/dL — ABNORMAL HIGH (ref 70–99)

## 2018-07-06 LAB — CULTURE, BLOOD (ROUTINE X 2)
Special Requests: ADEQUATE
Special Requests: ADEQUATE

## 2018-07-06 LAB — MAGNESIUM: Magnesium: 2.1 mg/dL (ref 1.7–2.4)

## 2018-07-06 MED ORDER — SODIUM CHLORIDE 0.9 % IV SOLN
INTRAVENOUS | Status: DC | PRN
  Administered 2018-07-06 – 2018-07-07 (×2): 1000 mL via INTRAVENOUS
  Administered 2018-07-09: 250 mL via INTRAVENOUS

## 2018-07-06 MED ORDER — TECHNETIUM TC 99M MEBROFENIN IV KIT: 6.5000 | PACK | Freq: Once | INTRAVENOUS | Status: DC | PRN

## 2018-07-06 MED ORDER — TECHNETIUM TC 99M MEBROFENIN IV KIT
6.5000 | PACK | Freq: Once | INTRAVENOUS | Status: AC | PRN
  Administered 2018-07-06: 6.5 via INTRAVENOUS

## 2018-07-06 NOTE — Progress Notes (Signed)
Patient resting comfortably during shift report. Denies complaints.  

## 2018-07-06 NOTE — Progress Notes (Signed)
   Patient Name: HUEY SCALIA Date of Encounter: 07/06/2018, 2:33 PM    Subjective  Had HIDA No pain Feeling a bit stronger   Objective  BP (!) 149/84   Pulse 68   Temp 98 F (36.7 C) (Oral)   Resp 20   Ht 5\' 10"  (1.778 m)   Wt 103 kg   SpO2 96%   BMI 32.57 kg/m   Resting in bed Mildly icteric abd protuberant, obese, soft and nontender BS + Awake and alert  Lab Results  Component Value Date   ALT 79 (H) 07/06/2018   AST 45 (H) 07/06/2018   ALKPHOS 193 (H) 07/06/2018   BILITOT 4.2 (H) 07/06/2018   CBC Latest Ref Rng & Units 07/06/2018 07/05/2018 07/04/2018  WBC 4.0 - 10.5 K/uL 5.7 7.3 -  Hemoglobin 13.0 - 17.0 g/dL 9.3(L) 9.3(L) 8.6(L)  Hematocrit 39.0 - 52.0 % 30.1(L) 30.7(L) 28.0(L)  Platelets 150 - 400 K/uL 109(L) 98(L) -    Lab Results  Component Value Date   INR 1.11 07/03/2018   INR 1.43 07/02/2018   INR 1.13 07/01/2018   HIDA - did not fill GB, CBD or small bowel - suspected due to hepocellular dysfx EGD/EUS yesterday - esophagitis/ulcers, Mallorry Weiss tear, hiatal hernia, gallbladder and CBD sludge, periampullary tid with intradiverticular papilla, pancraetic atrophy    Assessment and Plan  1) Esophagitis and Mallory- Weiss tear as cause of GI bleeding 2) Nodular liver and suspectedCirrhosis but no signs major portal hypertension 3)  E coli bacteremia and Klebsiella UTI 4) Syncope (? From # 3 + other things) 5) Afib RVR  1) Tx w/ PPI 2) Supportive care 3) Think GB probably not a source of problems - do suspect his LFT changes are in part due to some ischemic injury from hypotension  4) Allow some diet  5) At some point unenhanced MR liver - note the EUS did not show any signs gallbladder cancer/tumor but cross sectional imaging of liver would be helpful  6) will f/u tomorrow  Gatha Mayer, MD, [facility] 07/06/2018 2:33 PM Pager [phone]

## 2018-07-06 NOTE — Progress Notes (Signed)
   07/06/18 0350  Vitals  Temp (!) 100.4 F (38 C) (noted)  Temp Source Oral  BP 125/69  MAP (mmHg) 85  BP Method Automatic  Patient Position (if appropriate) Lying  Pulse Rate 82  Pulse Rate Source Monitor  Resp 18  Oxygen Therapy  SpO2 91 %  O2 Device Room Air   Will continue to monitor patient's temperature.

## 2018-07-06 NOTE — Progress Notes (Signed)
Patient ID: Ernest Parker, male   DOB: Feb 18, 1934, 84 y.o.   MRN: [id number]  PROGRESS NOTE    Ernest Parker  DEY:[id number] DOB: Feb 13, 1934 DOA: 07/01/2018 PCP: Seward Carol, MD   Brief Narrative:  84 year old male with history of A. fib on Eliquis, status post pacemaker in 09/2017, remote prostate cancer, hypertension, hyperlipidemia presented with  unresponsiveness.  CT of the head was negative for bleed.  Troponin was mildly elevated.  Cardiology was consulted.  He was started on IV fluids for AKI.  LFTs were mildly elevated.  GI was consulted. He was planned for cardiac catheterization on 07/04/2018. Patient had chills on 07/03/2018 and went into A. fib with RVR.  He was started on empiric antibiotics and had to be placed on Cardizem drip. He also had upper GI bleeding.  He had EGD with EUS on 07/05/2018.   Assessment & Plan:   Principal Problem:   Syncope and collapse Active Problems:   Atrial fibrillation with rapid ventricular response (HCC)   Essential hypertension   History of prostate cancer   Elevated LFTs   CKD (chronic kidney disease) stage 3, GFR 30-59 ml/min (HCC)   PAF (paroxysmal atrial fibrillation) (HCC)   Elevated troponin  E. coli bacteremia -Possibly from urinary tract infection.  Cannot rule out biliary source. -Urine culture is growing Klebsiella pneumoniae and Enterococcus faecalis though.  Foley catheter was changed on 07/03/2018.  Patient had chills on 07/03/2018 and blood cultures drawn at that time is positive for E. coli.  Blood cultures on admission was negative.  Follow repeat blood cultures from today. - If there is persistent bacteremia, there might be a concern for pacemaker infection as well. -Continue Levaquin as urine culture is growing Enterococcus faecalis as well sensitive to Levaquin.  UTI -Probably associated with chronic indwelling Foley catheter. -Probably evolving on admission.  Urine culture done at the time of admission from the  indwelling Foley catheter was positive for Klebsiella pneumoniae and Enterococcus faecalis.  Foley catheter was changed on 07/03/2018.  Repeat urine cultures from 07/03/2018 is growing the same and organisms  Syncope with elevated troponins -Unclear etiology.  Had prolonged confusion for 20 to 30 minutes -Might be related to orthostatic hypotension: Lasix on hold.  Discontinue IV fluids -Echo showed EF of 55 to 60% -Cardiology had planned for cardiac catheterization but this has been postponed because of bacteremia.  Outpatient follow-up with cardiology  Paroxysmal atrial fibrillation/flutter with rapid ventricular rate; bradycardia status post pacemaker in 1/19 -had to be put on Cardizem drip overnight of 07/03/2018.  Currently rate controlled.  On oral Cardizem and beta-blocker.  Outpatient follow-up with cardiology.  Heparin drip discontinued because of GI bleed.  Will wait for further GI recommendations before starting Eliquis or heparin.  Probable upper GI bleeding -GI following.  Continue Protonix.  Status post EGD with EUS on 07/05/2018 which showed esophageal ulcers, Barrett's esophagus and healing Mallory-Weiss tear.  Elevated LFTs -Right upper quadrant ultrasound showed cholelithiasis without cholecystitis -AST/ALT are improving but alkaline phosphatase and bili total has worsened today.  Monitor.  Follow further GI recommendations.  Abnormal gallbladder -Ultrasound showed possible underlying malignant process versus sludge.  -Status post EUS done on 07/05/2018: Follow further GI recommendations.  For HIDA scan today  -Patient will require CT abdomen with IV contrast versus MRI/MRCP with contrast.  Renal function is at baseline today.  We will follow-up with GI to see if either of this test can be done today.  Acute kidney injury on  chronic kidney disease stage III -Creatinine is improving at 1.67.  Baseline creatinine is around 1.7 -Lasix on hold.  Repeat a.m. creatinine.  DC IV  fluids  Chronic urinary retention with chronic indwelling Foley's catheter -Outpatient follow-up with urology.  Urology had recommended to replace Foley's catheter while inpatient.  Foley catheter was changed on 06/30/2018.  Thrombocytopenia -Questionable cause.  Monitor  DVT prophylaxis: Heparin discontinued because of probable upper GI bleeding Code Status: DNR Family Communication: Daughter and son at bedside Disposition Plan: Depends on clinical outcome  Consultants: Cardiology/GI  Procedures:  Echo Study Conclusions  - Left ventricle: The cavity size was normal. There was moderate   concentric hypertrophy. Systolic function was normal. The   estimated ejection fraction was in the range of 55% to 60%. Wall   motion was normal; there were no regional wall motion   abnormalities. - Aortic valve: There was trivial regurgitation. - Mitral valve: Mildly calcified annulus. There was mild   regurgitation. - Left atrium: The atrium was moderately dilated.  EGD and EUS on 07/05/2018 Findings:      ENDOSCOPIC FINDING: :      No gross lesions were noted in the proximal esophagus.      Three linear esophageal ulcers with no bleeding and stigmata of recent       bleeding were found in the mid esophagus. Biopsies were taken with a       cold forceps for histology.      Two tongues of salmon-colored mucosa were present from 30 to 36 cm. The       maximum longitudinal extent of these esophageal mucosal changes was 6 cm       in length. Biopsies were taken with a cold forceps for histology.      A medium non-bleeding Mallory-Weiss tear was found but looks to be       healing right at the Z-line junction into the hiatal hernia.      The Z-line was irregular and was found 36 cm from the incisors.      A medium-sized hiatal hernia was found. The proximal extent of the       gastric folds (end of tubular esophagus) was 37 cm from the incisors.       The hiatal narrowing was 42 cm from the  incisors.      No gross lesions were noted in the entire examined stomach otherwise.       Biopsies were taken with a cold forceps for histology and Helicobacter       pylori testing.      Multiple diffuse, small erosions were found in the duodenal bulb.      A medium diverticulum where the ampullary diverticulum was noted.      The major papilla was normal but completely intradiverticular.      No gross lesions were noted in the third portion of the duodenum.      ENDOSONOGRAPHIC FINDING: :      Extensive hyperechoic material consistent with sludge was visualized       endosonographically in the gallbladder. However, this obscures the       common hepatic duct region due to the shadowing defects that are noted.      There was dilation in the middle third of the main bile duct. There is       obscuring as a result of the gallbladder itself of the upper third of       the main bile  duct. The main CBD in the head of the pancreas before the       cystic duct insertion is 2.6 mm -> 3.4 mm.      Moderate hyperechoic material consistent with sludge was visualized       endosonographically in the presumed middle third of the main bile duct.       This was adjacent to the sludge/debris that was within the gallbladder       region as well. There is subsequent dilation at that region.      Pancreatic parenchymal abnormalities were noted in the genu of the       pancreas, pancreatic body and pancreatic tail. These consisted of       atrophy.      The pancreatic duct had a normal endosonographic appearance in the       pancreatic head (PD =1.1 mm -> 1.7 mm), genu of the pancreas (1.2 mm),       body of the pancreas (0.8 mm), and the tail of the pancreas (0.5 mm).      Endosonographic imaging of the ampulla showed no intramural       (subepithelial) lesion or mass.      A cyst was found in the left lobe of the liver and measured 28 mm by 26       mm in maximal cross-sectional diameter. The cyst was  anechoic. It was       thinly septated.      Endosonographic imaging in the rest of the visualized portion of the       liver showed no lesion.      A small amount of fluid - ascites was visualized as an anechoic feature,       was found in the peritoneal cavity.      The celiac region was visualized. Impression:               EGD Impression:                           - No gross lesions in proximal esophagus.                           - Non-bleeding esophageal ulcers in middle                            esophagus. Biopsied.                           - Salmon-colored mucosa suspicious for long-segment                            Barrett's esophagus and classified as Barrett's                            stage C0-M6 per [place] criteria. Biopsied to                            confirm diagnosis.                           - Mallory-Weiss tear that is healing at the distal  GE Junction..                           - Z-line irregular, 36 cm from the incisors.                           - Medium-sized hiatal hernia.                           - No other gross lesions in the stomach. Biopsied.                           - Erosive duodenopathy.                           - Non-bleeding duodenal diverticulum.                           - Normal major papilla but is located entirely                            intradiverticular.                           - No gross lesions in the third portion of the                            duodenum.                           EUS Impression:                           - Hyperechoic material consistent with sludge and                            debris was visualized endosonographically in the                            gallbladder. There is significant obscuring of the                            common hepatic duct region as a result of this.                           - There was dilation in the middle third of the                             main bile duct and there was evidence/concern for                            hyperechoic material consistent with sludge was                            visualized endosonographically in this region,  however, the overt cystic duct insertion and then                            the development of the gallbladder obscuring                            shadowing defects makes things difficult to see,                            but sludge is noted.                           - Pancreatic parenchymal abnormalities consisting                            of atrophy were noted in the genu of the pancreas,                            pancreatic body and pancreatic tail. But the duct                            itself, had a normal endosonographic appearance in                            the pancreatic head, genu of the pancreas, body of                            the pancreas and tail of the pancreas.                           - A cyst was found in the left lobe of the liver                            and measured 28 mm by 26 mm.                           - Ascites was found on endosonographic examination                            of the peritoneal cavity. Recommendation:           - The patient will be observed post-procedure,                            until all discharge criteria are met.                           - Return patient to hospital ward for ongoing care.                           - Observe patient's clinical course.                           - Regular Diet OK for today.                           -  Will plan to proceed with HIDA to evaluate for                            evidence of cholecystitis and also to evaluate the                            biliary tree in regards to positioning of                            radiotracer into the duodenum for evaluation of                            partial obstruction or not.                           - Based on the patient's  kidney function, and as he                            has not had any cross-sectional imaging in months,                            he needs this to be performed. Ideally would get a                            CT-Abdmen with IV contrast vs a MRI-Abdomen with                            contrast. If kidney function is stable and no plan                            for urgent catheterization then would have medical                            team order scan on Saturday.                           - Reasonable on Saturday to discuss case with                            surgery as HIDA is being completed and his                            cross-sectional CT is performed.                           - Based on his liver function testing and the                            findings above will consider the role of                            cholecystectomy, cholecystostomy tube placement,  and role of ERCP moving forward.                           - The findings and recommendations were discussed                            with the patient.                           - The findings and recommendations were discussed                            with the patient's family.                           - The findings and recommendations were discussed                            with the referring physician.                           - GI MD team covering over the weekend.  Antimicrobials: Levaquin   Subjective: Patient seen and examined at bedside.  Patient denies any overnight fever, nausea, vomiting, worsening abdominal pain or diarrhea.  objective: Vitals:   07/05/18 2010 07/05/18 2153 07/06/18 0350 07/06/18 0353  BP: 139/90 (!) 143/83 125/69   Pulse: 95 94 82   Resp: 18  18   Temp: 97.6 F (36.4 C)  (S) (!) 100.4 F (38 C)   TempSrc: Oral  Oral   SpO2: 97%  91%   Weight:    103 kg  Height:        Intake/Output Summary (Last 24 hours) at 07/06/2018 1028 Last data  filed at 07/06/2018 0800 Gross per 24 hour  Intake 3575.29 ml  Output 2075 ml  Net 1500.29 ml   Filed Weights   07/04/18 0522 07/05/18 0548 07/06/18 0353  Weight: 99.8 kg 101.2 kg 103 kg    Examination:  General exam: No acute distress. Respiratory system: Bilateral decreased breath sounds at bases, with scattered crackles cardiovascular system: S1 & S2 heard, rate controlled gastrointestinal system: Abdomen is nondistended, soft and nontender. Normal bowel sounds heard. Extremities: No cyanosis.  No edema Data Reviewed: I have personally reviewed following labs and imaging studies  CBC: Recent Labs  Lab 07/02/18 0050 07/03/18 0548 07/03/18 1958 07/04/18 0505 07/04/18 1632 07/05/18 0627 07/06/18 0601  WBC 11.7* 6.5  --  11.3*  --  7.3 5.7  NEUTROABS  --  5.4  --  10.0*  --  5.7 4.5  HGB 10.4* 10.9* 10.7* 9.6* 8.6* 9.3* 9.3*  HCT 32.7* 33.3* 32.7* 30.8* 28.0* 30.7* 30.1*  MCV 89.8 89.3  --  91.9  --  92.7 91.2  PLT 127* 120*  --  97*  --  98* 037*   Basic Metabolic Panel: Recent Labs  Lab 07/02/18 0050 07/03/18 0548 07/04/18 0505 07/05/18 0627 07/06/18 0601  NA 135 136 136 140 138  K 3.3* 3.5 4.1 3.9 3.7  CL 106 104 104 108 109  CO2 '22 23 22 23 ' 21*  GLUCOSE 161* 119* 172* 106* 102*  BUN 39* 30* 33* 31* 24*  CREATININE 2.45* 1.79* 2.14* 1.87* 1.67*  CALCIUM 8.5* 8.4* 8.0* 8.4* 8.1*  MG  --   --  1.9 2.2 2.1   GFR: Estimated Creatinine Clearance: 41 mL/min (A) (by C-G formula based on SCr of 1.67 mg/dL (H)). Liver Function Tests: Recent Labs  Lab 07/02/18 0050 07/03/18 0548 07/04/18 0505 07/05/18 0627 07/06/18 0601  AST 184* 65* 43* 38 45*  ALT 345* 191* 123* 95* 79*  ALKPHOS 219* 177* 153* 152* 193*  BILITOT 4.8* 3.2* 3.2* 3.7* 4.2*  PROT 6.0* 5.8* 5.5* 5.5* 5.2*  ALBUMIN 2.7* 2.3* 2.2* 2.2* 2.0*   No results for input(s): LIPASE, AMYLASE in the last 168 hours. Recent Labs  Lab 07/01/18 1445 07/01/18 2059  AMMONIA 59* 41*   Coagulation  Profile: Recent Labs  Lab 07/01/18 1647 07/02/18 0050 07/03/18 0548  INR 1.13 1.43 1.11   Cardiac Enzymes: Recent Labs  Lab 07/01/18 1445 07/01/18 2059 07/02/18 0050 07/02/18 0748 07/02/18 1435 07/02/18 1903  CKTOTAL  --  196  --   --   --   --   TROPONINI 0.89*  --  0.93* 0.52* 0.37* 0.30*   BNP (last 3 results) No results for input(s): PROBNP in the last 8760 hours. HbA1C: No results for input(s): HGBA1C in the last 72 hours. CBG: Recent Labs  Lab 07/05/18 0818 07/05/18 1122 07/05/18 1640 07/05/18 2127 07/06/18 0728  GLUCAP 95 92 145* 99 99   Lipid Profile: No results for input(s): CHOL, HDL, LDLCALC, TRIG, CHOLHDL, LDLDIRECT in the last 72 hours. Thyroid Function Tests: No results for input(s): TSH, T4TOTAL, FREET4, T3FREE, THYROIDAB in the last 72 hours. Anemia Panel: No results for input(s): VITAMINB12, FOLATE, FERRITIN, TIBC, IRON, RETICCTPCT in the last 72 hours. Sepsis Labs: No results for input(s): PROCALCITON, LATICACIDVEN in the last 168 hours.  Recent Results (from the past 240 hour(s))  Culture, Urine     Status: Abnormal   Collection Time: 07/02/18  7:37 AM  Result Value Ref Range Status   Specimen Description URINE, RANDOM  Final   Special Requests   Final    NONE Performed at [facility], [address], [place], [place] [address]    Culture (A)  Final    >=100,000 COLONIES/mL KLEBSIELLA PNEUMONIAE 40,000 COLONIES/mL ENTEROCOCCUS FAECALIS    Report Status 07/05/2018 FINAL  Final   Organism ID, Bacteria KLEBSIELLA PNEUMONIAE (A)  Final   Organism ID, Bacteria ENTEROCOCCUS FAECALIS (A)  Final      Susceptibility   Enterococcus faecalis - MIC*    AMPICILLIN <=2 SENSITIVE Sensitive     LEVOFLOXACIN 0.5 SENSITIVE Sensitive     NITROFURANTOIN <=16 SENSITIVE Sensitive     VANCOMYCIN <=0.5 SENSITIVE Sensitive     * 40,000 COLONIES/mL ENTEROCOCCUS FAECALIS   Klebsiella pneumoniae - MIC*    AMPICILLIN >=32 RESISTANT Resistant      CEFAZOLIN <=4 SENSITIVE Sensitive     CEFTRIAXONE <=1 SENSITIVE Sensitive     CIPROFLOXACIN <=0.25 SENSITIVE Sensitive     GENTAMICIN <=1 SENSITIVE Sensitive     IMIPENEM <=0.25 SENSITIVE Sensitive     NITROFURANTOIN 128 RESISTANT Resistant     TRIMETH/SULFA <=20 SENSITIVE Sensitive     AMPICILLIN/SULBACTAM 16 INTERMEDIATE Intermediate     PIP/TAZO 16 SENSITIVE Sensitive     Extended ESBL NEGATIVE Sensitive     * >=100,000 COLONIES/mL KLEBSIELLA PNEUMONIAE  Culture, blood (routine x 2)     Status: None (Preliminary result)   Collection Time: 07/02/18 10:45 PM  Result Value Ref Range Status   Specimen Description BLOOD  LEFT ANTECUBITAL  Final   Special Requests   Final    BOTTLES DRAWN AEROBIC ONLY Blood Culture results may not be optimal due to an inadequate volume of blood received in culture bottles   Culture   Final    NO GROWTH 4 DAYS Performed at [facility], [place] [address], [place], [place] [address]    Report Status PENDING  Incomplete  Culture, blood (routine x 2)     Status: None (Preliminary result)   Collection Time: 07/02/18 10:51 PM  Result Value Ref Range Status   Specimen Description BLOOD LEFT FOREARM  Final   Special Requests   Final    BOTTLES DRAWN AEROBIC AND ANAEROBIC Blood Culture results may not be optimal due to an inadequate volume of blood received in culture bottles   Culture   Final    NO GROWTH 4 DAYS Performed at [facility], [place] [address], [place], [place] [address]    Report Status PENDING  Incomplete  Culture, blood (routine x 2)     Status: Abnormal   Collection Time: 07/03/18  4:15 PM  Result Value Ref Range Status   Specimen Description BLOOD RIGHT ANTECUBITAL  Final   Special Requests   Final    BOTTLES DRAWN AEROBIC AND ANAEROBIC Blood Culture adequate volume   Culture  Setup Time   Final    GRAM NEGATIVE RODS IN BOTH AEROBIC AND ANAEROBIC BOTTLES CRITICAL RESULT CALLED TO, READ BACK BY AND VERIFIED WITH: Zoila Shutter  [id number] MLM Performed at [facility], [place] [address] [place], [place] [address]    Culture ESCHERICHIA COLI (A)  Final   Report Status 07/06/2018 FINAL  Final   Organism ID, Bacteria ESCHERICHIA COLI  Final      Susceptibility   Escherichia coli - MIC*    AMPICILLIN 4 SENSITIVE Sensitive     CEFAZOLIN <=4 SENSITIVE Sensitive     CEFEPIME <=1 SENSITIVE Sensitive     CEFTAZIDIME <=1 SENSITIVE Sensitive     CEFTRIAXONE <=1 SENSITIVE Sensitive     CIPROFLOXACIN <=0.25 SENSITIVE Sensitive     GENTAMICIN <=1 SENSITIVE Sensitive     IMIPENEM <=0.25 SENSITIVE Sensitive     TRIMETH/SULFA <=20 SENSITIVE Sensitive     AMPICILLIN/SULBACTAM <=2 SENSITIVE Sensitive     PIP/TAZO <=4 SENSITIVE Sensitive     Extended ESBL NEGATIVE Sensitive     * ESCHERICHIA COLI  Blood Culture ID Panel (Reflexed)     Status: Abnormal   Collection Time: 07/03/18  4:15 PM  Result Value Ref Range Status   Enterococcus species NOT DETECTED NOT DETECTED Final   Listeria monocytogenes NOT DETECTED NOT DETECTED Final   Staphylococcus species NOT DETECTED NOT DETECTED Final   Staphylococcus aureus (BCID) NOT DETECTED NOT DETECTED Final   Streptococcus species NOT DETECTED NOT DETECTED Final   Streptococcus agalactiae NOT DETECTED NOT DETECTED Final   Streptococcus pneumoniae NOT DETECTED NOT DETECTED Final   Streptococcus pyogenes NOT DETECTED NOT DETECTED Final   Acinetobacter baumannii NOT DETECTED NOT DETECTED Final   Enterobacteriaceae species DETECTED (A) NOT DETECTED Final    Comment: Enterobacteriaceae represent a large family of gram-negative bacteria, not a single organism. CRITICAL RESULT CALLED TO, READ BACK BY AND VERIFIED WITH: PHARMD V BRYK [id number] MLM    Enterobacter cloacae complex NOT DETECTED NOT DETECTED Final   Escherichia coli DETECTED (A) NOT DETECTED Final    Comment: CRITICAL RESULT CALLED TO, READ BACK BY AND VERIFIED  WITH: PHARMD V BRYK [id number] MLM    Klebsiella  oxytoca NOT DETECTED NOT DETECTED Final   Klebsiella pneumoniae NOT DETECTED NOT DETECTED Final   Proteus species NOT DETECTED NOT DETECTED Final   Serratia marcescens NOT DETECTED NOT DETECTED Final   Carbapenem resistance NOT DETECTED NOT DETECTED Final   Haemophilus influenzae NOT DETECTED NOT DETECTED Final   Neisseria meningitidis NOT DETECTED NOT DETECTED Final   Pseudomonas aeruginosa NOT DETECTED NOT DETECTED Final   Candida albicans NOT DETECTED NOT DETECTED Final   Candida glabrata NOT DETECTED NOT DETECTED Final   Candida krusei NOT DETECTED NOT DETECTED Final   Candida parapsilosis NOT DETECTED NOT DETECTED Final   Candida tropicalis NOT DETECTED NOT DETECTED Final    Comment: Performed at [facility], [place] [address] [place], [place] [address]  Culture, blood (routine x 2)     Status: Abnormal   Collection Time: 07/03/18  4:30 PM  Result Value Ref Range Status   Specimen Description BLOOD LEFT HAND  Final   Special Requests   Final    BOTTLES DRAWN AEROBIC AND ANAEROBIC Blood Culture adequate volume   Culture  Setup Time   Final    GRAM NEGATIVE RODS IN BOTH AEROBIC AND ANAEROBIC BOTTLES CRITICAL VALUE NOTED.  VALUE IS CONSISTENT WITH PREVIOUSLY REPORTED AND CALLED VALUE.    Culture (A)  Final    ESCHERICHIA COLI SUSCEPTIBILITIES PERFORMED ON PREVIOUS CULTURE WITHIN THE LAST 5 DAYS. Performed at [facility], [place] [address], [place], [place] [address]    Report Status 07/06/2018 FINAL  Final  Culture, Urine     Status: Abnormal (Preliminary result)   Collection Time: 07/03/18  5:08 PM  Result Value Ref Range Status   Specimen Description URINE, CATHETERIZED  Final   Special Requests NONE  Final   Culture (A)  Final    >=100,000 COLONIES/mL KLEBSIELLA PNEUMONIAE >=100,000 COLONIES/mL ENTEROCOCCUS FAECALIS SUSCEPTIBILITIES TO FOLLOW REPEATING FOR ORGANISM 2. CULTURE REINCUBATED FOR BETTER GROWTH Performed at [facility], [place] [address]  [place], [place] [address]    Report Status PENDING  Incomplete   Organism ID, Bacteria KLEBSIELLA PNEUMONIAE (A)  Final      Susceptibility   Klebsiella pneumoniae - MIC*    AMPICILLIN >=32 RESISTANT Resistant     CEFAZOLIN <=4 SENSITIVE Sensitive     CEFTRIAXONE <=1 SENSITIVE Sensitive     CIPROFLOXACIN <=0.25 SENSITIVE Sensitive     GENTAMICIN <=1 SENSITIVE Sensitive     IMIPENEM <=0.25 SENSITIVE Sensitive     NITROFURANTOIN 64 INTERMEDIATE Intermediate     TRIMETH/SULFA <=20 SENSITIVE Sensitive     AMPICILLIN/SULBACTAM 16 INTERMEDIATE Intermediate     PIP/TAZO 16 SENSITIVE Sensitive     Extended ESBL NEGATIVE Sensitive     * >=100,000 COLONIES/mL KLEBSIELLA PNEUMONIAE         Radiology Studies: No results found.      Scheduled Meds: . diltiazem  120 mg Oral Daily  . docusate sodium  100 mg Oral BID  . insulin aspart  0-9 Units Subcutaneous TID WC  . metoprolol succinate  25 mg Oral BID  . pantoprazole  40 mg Oral BID  . sodium chloride flush  3 mL Intravenous Q12H  . tamsulosin  0.4 mg Oral BID   Continuous Infusions: . sodium chloride 50 mL/hr at 07/06/18 0359  . diltiazem (CARDIZEM) infusion Stopped (07/04/18 0945)  . levofloxacin (LEVAQUIN) IV 500 mg (07/05/18 1155)     LOS: 4  days        Aline August, MD [facility] Pager [phone]  If 7PM-7AM, please contact night-coverage [email or web address] Password [email or web address] 07/06/2018, 10:28 AM

## 2018-07-07 ENCOUNTER — Inpatient Hospital Stay (HOSPITAL_COMMUNITY): Payer: Medicare Other

## 2018-07-07 LAB — COMPREHENSIVE METABOLIC PANEL
ALBUMIN: 2 g/dL — AB (ref 3.5–5.0)
ALT: 66 U/L — AB (ref 0–44)
ANION GAP: 8 (ref 5–15)
AST: 44 U/L — ABNORMAL HIGH (ref 15–41)
Alkaline Phosphatase: 196 U/L — ABNORMAL HIGH (ref 38–126)
BUN: 21 mg/dL (ref 8–23)
CHLORIDE: 109 mmol/L (ref 98–111)
CO2: 20 mmol/L — AB (ref 22–32)
Calcium: 8 mg/dL — ABNORMAL LOW (ref 8.9–10.3)
Creatinine, Ser: 1.62 mg/dL — ABNORMAL HIGH (ref 0.61–1.24)
GFR calc Af Amer: 44 mL/min — ABNORMAL LOW (ref >60)
GFR calc non Af Amer: 38 mL/min — ABNORMAL LOW (ref >60)
GLUCOSE: 93 mg/dL (ref 70–99)
Potassium: 3.6 mmol/L (ref 3.5–5.1)
SODIUM: 137 mmol/L (ref 135–145)
Total Bilirubin: 5.8 mg/dL — ABNORMAL HIGH (ref 0.3–1.2)
Total Protein: 5.2 g/dL — ABNORMAL LOW (ref 6.5–8.1)

## 2018-07-07 LAB — BLOOD CULTURE ID PANEL (REFLEXED)
ACINETOBACTER BAUMANNII: NOT DETECTED
CANDIDA ALBICANS: NOT DETECTED
CANDIDA TROPICALIS: NOT DETECTED
Candida glabrata: NOT DETECTED
Candida krusei: NOT DETECTED
Candida parapsilosis: NOT DETECTED
ENTEROBACTER CLOACAE COMPLEX: NOT DETECTED
ESCHERICHIA COLI: NOT DETECTED
Enterobacteriaceae species: NOT DETECTED
Enterococcus species: NOT DETECTED
HAEMOPHILUS INFLUENZAE: NOT DETECTED
KLEBSIELLA OXYTOCA: NOT DETECTED
KLEBSIELLA PNEUMONIAE: NOT DETECTED
Listeria monocytogenes: NOT DETECTED
METHICILLIN RESISTANCE: DETECTED — AB
Neisseria meningitidis: NOT DETECTED
Proteus species: NOT DETECTED
Pseudomonas aeruginosa: NOT DETECTED
SERRATIA MARCESCENS: NOT DETECTED
STAPHYLOCOCCUS AUREUS BCID: NOT DETECTED
STREPTOCOCCUS SPECIES: NOT DETECTED
Staphylococcus species: DETECTED — AB
Streptococcus agalactiae: NOT DETECTED
Streptococcus pneumoniae: NOT DETECTED
Streptococcus pyogenes: NOT DETECTED

## 2018-07-07 LAB — URINE CULTURE: Culture: >=100000 — AB

## 2018-07-07 LAB — CULTURE, BLOOD (ROUTINE X 2)
CULTURE: NO GROWTH
Culture: NO GROWTH

## 2018-07-07 LAB — CBC WITH DIFFERENTIAL/PLATELET
ABS IMMATURE GRANULOCYTES: 0.17 10*3/uL — AB (ref 0.00–0.07)
BASOS ABS: 0 10*3/uL (ref 0.0–0.1)
Basophils Relative: 1 %
Eosinophils Absolute: 0.1 10*3/uL (ref 0.0–0.5)
Eosinophils Relative: 2 %
HCT: 30.9 % — ABNORMAL LOW (ref 39.0–52.0)
HEMOGLOBIN: 9.9 g/dL — AB (ref 13.0–17.0)
Immature Granulocytes: 3 %
Lymphocytes Relative: 11 %
Lymphs Abs: 0.8 10*3/uL (ref 0.7–4.0)
MCH: 28.9 pg (ref 26.0–34.0)
MCHC: 32 g/dL (ref 30.0–36.0)
MCV: 90.1 fL (ref 80.0–100.0)
Monocytes Absolute: 0.5 10*3/uL (ref 0.1–1.0)
Monocytes Relative: 8 %
NEUTROS ABS: 5.1 10*3/uL (ref 1.7–7.7)
NRBC: 0 % (ref 0.0–0.2)
Neutrophils Relative %: 75 %
Platelets: 154 10*3/uL (ref 150–400)
RBC: 3.43 MIL/uL — AB (ref 4.22–5.81)
RDW: 15.3 % (ref 11.5–15.5)
WBC: 6.6 10*3/uL (ref 4.0–10.5)

## 2018-07-07 LAB — GLUCOSE, CAPILLARY
GLUCOSE-CAPILLARY: 96 mg/dL (ref 70–99)
Glucose-Capillary: 100 mg/dL — ABNORMAL HIGH (ref 70–99)
Glucose-Capillary: 150 mg/dL — ABNORMAL HIGH (ref 70–99)
Glucose-Capillary: 126 mg/dL — ABNORMAL HIGH (ref 70–99)

## 2018-07-07 LAB — MAGNESIUM: Magnesium: 1.9 mg/dL (ref 1.7–2.4)

## 2018-07-07 NOTE — Progress Notes (Signed)
Page to Primary 3e26 [employer] rep requested MD be made aware of no anticoag with pacer/afib. no cards in 5 days, so letting primary know.

## 2018-07-07 NOTE — Progress Notes (Signed)
Patient resting comfortably during shift report. Denies complaints.  

## 2018-07-07 NOTE — Plan of Care (Signed)
  Problem: Health Behavior/Discharge Planning: Goal: Ability to manage health-related needs will improve Outcome: Progressing   Problem: Pain Managment: Goal: General experience of comfort will improve Outcome: Progressing   

## 2018-07-07 NOTE — Consult Note (Signed)
Reason for Consult:gallstones, CBD stones; possible GB infection Referring Physician: [name] is an 84 y.o. male.  HPI: 84 year old male was admitted 6 days ago with syncope, acute kidney injury, elevated LFTs and a urinary tract infection.  His past medical history includes AAA status post stent, A. fib on Eliquis, pacemaker, hypertension, prostate cancer and recent placement of Foley due to urinary retention.  He had a period of unresponsiveness for around 30 minutes in the middle the night and was brought to the emergency room.  He reportedly did not feel well the night before the event.  On admission he denied any abdominal pain.  He had a mildly elevated troponin on admission which quickly normalized.  He was found to have E. coli bacteremia on blood cultures.  He had a nodular appearing liver on ultrasound along with gallstones.  His kidney function gradually improved but his LFTs remained elevated.  Cardiology was planning a heart catheterization this admission but decided to wait to do as an outpatient because of the bacteremia.  He also had an upper GI bleed during this admission and underwent EGD and EUS.  He was found to have a Mallory-Weiss tear, findings suspicious for Barrett's esophagus, and erosive duodenoscopy.  EUS revealed gallstones and sludge.  Bile duct evaluation was hard to interpret given shadowing stones.  He underwent a HIDA scan which was inconclusive probably due to the elevated LFTs.  He underwent an MRI MRCP today which revealed no evidence of cirrhosis, mild biliary ductal dilatation, multiple common bile duct stones, gallstones and mild gallbladder wall thickening suspicious for cholecystitis  Cardiology is recommending an outpatient ischemic work-up.  Urine culture showed Klebsiella and enterococcus  He currently denies any abdominal pain, nausea or vomiting.  He reports a few months ago he had episodes of right upper quadrant pain.  1 of which which  prompted him to go to the hospital in [place].  He states this is when they found the AAA and did stent placement  He has outpatient follow-up with urology on Monday to discuss surgery for a large prostate which they think is causing the retention per the family  Past Medical History:  Diagnosis Date  . A-fib ([place])   . Dizzy   . Dyspnea   . GERD (gastroesophageal reflux disease)   . Hard of hearing    wears hearing aids bilat  . High cholesterol   . Hypertension   . Presence of permanent cardiac pacemaker 12/17/2017  . Prostate cancer ([place]) 2002   S/P "radiation for 8-9 weeks"  . SOB (shortness of breath)     Past Surgical History:  Procedure Laterality Date  . INGUINAL HERNIA REPAIR Bilateral   . INSERT / REPLACE / REMOVE PACEMAKER  12/17/2017  . PACEMAKER IMPLANT N/A 12/17/2017   Procedure: PACEMAKER IMPLANT;  Surgeon: Evans Lance, MD;  Location: [facility];  Service: Cardiovascular;  Laterality: N/A;  . PROSTATE BIOPSY    . THORACIC AORTIC ANEURYSM REPAIR      Family History  Problem Relation Age of Onset  . Hypertension Mother   . Tuberculosis Father   . Kidney disease Sister   . Sleep apnea Sister     Social History:  reports that he quit smoking about 39 years ago. His smoking use included cigarettes. He has a 15.00 pack-year smoking history. He has never used smokeless tobacco. He reports that he drinks about 1.0 standard drinks of alcohol per week. He reports that  he does not use drugs.  Allergies:  Allergies  Allergen Reactions  . Penicillins Other (See Comments)    Patient can't remember reaction was told by mother he had a SERIOUS allergic rxn as a child  Has pt had a PCN rxn causing immediate rash, facial/tongue/throat swelling, SOB or lightheadedness with hypotension: Unknown Has patient had a PCN rxn causing severe rash involving mucus membranes or skin necrosis: Unknown Has pt had a PCN rxn that required hospitalization: Unknown Has pt had a  PCN reaction occurring within the last 10 years: No If all of the above answers are "NO", then may pro    Medications: I have reviewed the patient's current medications.  Results for orders placed or performed during the hospital encounter of 07/01/18 (from the past 48 hour(s))  Glucose, capillary     Status: None   Collection Time: 07/05/18  9:27 PM  Result Value Ref Range   Glucose-Capillary 99 70 - 99 mg/dL   Comment 1 Notify RN    Comment 2 Document in Chart   CBC with Differential/Platelet     Status: Abnormal   Collection Time: 07/06/18  6:01 AM  Result Value Ref Range   WBC 5.7 4.0 - 10.5 K/uL   RBC 3.30 (L) 4.22 - 5.81 MIL/uL   Hemoglobin 9.3 (L) 13.0 - 17.0 g/dL   HCT 30.1 (L) 39.0 - 52.0 %   MCV 91.2 80.0 - 100.0 fL   MCH 28.2 26.0 - 34.0 pg   MCHC 30.9 30.0 - 36.0 g/dL   RDW 15.7 (H) 11.5 - 15.5 %   Platelets 109 (L) 150 - 400 K/uL    Comment: Immature Platelet Fraction may be clinically indicated, consider ordering this additional test [id number]    nRBC 0.0 0.0 - 0.2 %   Neutrophils Relative % 78 %   Neutro Abs 4.5 1.7 - 7.7 K/uL   Lymphocytes Relative 11 %   Lymphs Abs 0.6 (L) 0.7 - 4.0 K/uL   Monocytes Relative 8 %   Monocytes Absolute 0.4 0.1 - 1.0 K/uL   Eosinophils Relative 1 %   Eosinophils Absolute 0.0 0.0 - 0.5 K/uL   Basophils Relative 0 %   Basophils Absolute 0.0 0.0 - 0.1 K/uL   Immature Granulocytes 2 %   Abs Immature Granulocytes 0.09 (H) 0.00 - 0.07 K/uL    Comment: Performed at [facility], [address], [place], [place] [address]  Comprehensive metabolic panel     Status: Abnormal   Collection Time: 07/06/18  6:01 AM  Result Value Ref Range   Sodium 138 135 - 145 mmol/L   Potassium 3.7 3.5 - 5.1 mmol/L   Chloride 109 98 - 111 mmol/L   CO2 21 (L) 22 - 32 mmol/L   Glucose, Bld 102 (H) 70 - 99 mg/dL   BUN 24 (H) 8 - 23 mg/dL   Creatinine, Ser 1.67 (H) 0.61 - 1.24 mg/dL   Calcium 8.1 (L) 8.9 - 10.3 mg/dL   Total Protein 5.2 (L)  6.5 - 8.1 g/dL   Albumin 2.0 (L) 3.5 - 5.0 g/dL   AST 45 (H) 15 - 41 U/L   ALT 79 (H) 0 - 44 U/L   Alkaline Phosphatase 193 (H) 38 - 126 U/L   Total Bilirubin 4.2 (H) 0.3 - 1.2 mg/dL   GFR calc non Af Amer 37 (L) >60 mL/min   GFR calc Af Amer 42 (L) >60 mL/min    Comment: (NOTE) The eGFR has been  calculated using the CKD EPI equation. This calculation has not been validated in all clinical situations. eGFR's persistently <60 mL/min signify possible Chronic Kidney Disease.    Anion gap 8 5 - 15    Comment: Performed at [facility] [address], [place], [place] [address]  Magnesium     Status: None   Collection Time: 07/06/18  6:01 AM  Result Value Ref Range   Magnesium 2.1 1.7 - 2.4 mg/dL    Comment: Performed at [facility] [address] [place], [place] [address]  Glucose, capillary     Status: None   Collection Time: 07/06/18  7:28 AM  Result Value Ref Range   Glucose-Capillary 99 70 - 99 mg/dL   Comment 1 Notify RN    Comment 2 Document in Chart   Culture, blood (routine x 2)     Status: None (Preliminary result)   Collection Time: 07/06/18  8:41 AM  Result Value Ref Range   Specimen Description BLOOD RIGHT ARM    Special Requests      BOTTLES DRAWN AEROBIC AND ANAEROBIC Blood Culture adequate volume   Culture  Setup Time      GRAM POSITIVE COCCI IN BOTH AEROBIC AND ANAEROBIC BOTTLES CRITICAL RESULT CALLED TO, READ BACK BY AND VERIFIED WITH: Dory Larsen [id number] MLM Performed at [facility], [place] [address] [place], [place] [address]    Culture GRAM POSITIVE COCCI    Report Status PENDING   Blood Culture ID Panel (Reflexed)     Status: Abnormal   Collection Time: 07/06/18  8:41 AM  Result Value Ref Range   Enterococcus species NOT DETECTED NOT DETECTED   Listeria monocytogenes NOT DETECTED NOT DETECTED   Staphylococcus species DETECTED (A) NOT DETECTED    Comment: Methicillin (oxacillin) resistant coagulase negative  staphylococcus. Possible blood culture contaminant (unless isolated from more than one blood culture draw or clinical case suggests pathogenicity). No antibiotic treatment is indicated for blood  culture contaminants. CRITICAL RESULT CALLED TO, READ BACK BY AND VERIFIED WITH: PHARMD B MANCHERIL [id number] MLM    Staphylococcus aureus (BCID) NOT DETECTED NOT DETECTED   Methicillin resistance DETECTED (A) NOT DETECTED    Comment: CRITICAL RESULT CALLED TO, READ BACK BY AND VERIFIED WITH: PHARMD B MANCHERIL [id number] MLM    Streptococcus species NOT DETECTED NOT DETECTED   Streptococcus agalactiae NOT DETECTED NOT DETECTED   Streptococcus pneumoniae NOT DETECTED NOT DETECTED   Streptococcus pyogenes NOT DETECTED NOT DETECTED   Acinetobacter baumannii NOT DETECTED NOT DETECTED   Enterobacteriaceae species NOT DETECTED NOT DETECTED   Enterobacter cloacae complex NOT DETECTED NOT DETECTED   Escherichia coli NOT DETECTED NOT DETECTED   Klebsiella oxytoca NOT DETECTED NOT DETECTED   Klebsiella pneumoniae NOT DETECTED NOT DETECTED   Proteus species NOT DETECTED NOT DETECTED   Serratia marcescens NOT DETECTED NOT DETECTED   Haemophilus influenzae NOT DETECTED NOT DETECTED   Neisseria meningitidis NOT DETECTED NOT DETECTED   Pseudomonas aeruginosa NOT DETECTED NOT DETECTED   Candida albicans NOT DETECTED NOT DETECTED   Candida glabrata NOT DETECTED NOT DETECTED   Candida krusei NOT DETECTED NOT DETECTED   Candida parapsilosis NOT DETECTED NOT DETECTED   Candida tropicalis NOT DETECTED NOT DETECTED    Comment: Performed at [facility], [address], [place], [place] [address]  Culture, blood (routine x 2)     Status: None (Preliminary result)   Collection Time: 07/06/18  8:46  AM  Result Value Ref Range   Specimen Description BLOOD RIGHT HAND    Special Requests      BOTTLES DRAWN AEROBIC ONLY Blood Culture adequate volume   Culture      NO GROWTH 1 DAY Performed at [facility] [address] [place], [place] [address]    Report Status PENDING   Glucose, capillary     Status: None   Collection Time: 07/06/18 12:30 PM  Result Value Ref Range   Glucose-Capillary 88 70 - 99 mg/dL  Glucose, capillary     Status: Abnormal   Collection Time: 07/06/18  4:18 PM  Result Value Ref Range   Glucose-Capillary 120 (H) 70 - 99 mg/dL  CBC with Differential/Platelet     Status: Abnormal   Collection Time: 07/07/18  6:30 AM  Result Value Ref Range   WBC 6.6 4.0 - 10.5 K/uL   RBC 3.43 (L) 4.22 - 5.81 MIL/uL   Hemoglobin 9.9 (L) 13.0 - 17.0 g/dL   HCT 30.9 (L) 39.0 - 52.0 %   MCV 90.1 80.0 - 100.0 fL   MCH 28.9 26.0 - 34.0 pg   MCHC 32.0 30.0 - 36.0 g/dL   RDW 15.3 11.5 - 15.5 %   Platelets 154 150 - 400 K/uL   nRBC 0.0 0.0 - 0.2 %   Neutrophils Relative % 75 %   Neutro Abs 5.1 1.7 - 7.7 K/uL   Lymphocytes Relative 11 %   Lymphs Abs 0.8 0.7 - 4.0 K/uL   Monocytes Relative 8 %   Monocytes Absolute 0.5 0.1 - 1.0 K/uL   Eosinophils Relative 2 %   Eosinophils Absolute 0.1 0.0 - 0.5 K/uL   Basophils Relative 1 %   Basophils Absolute 0.0 0.0 - 0.1 K/uL   Immature Granulocytes 3 %   Abs Immature Granulocytes 0.17 (H) 0.00 - 0.07 K/uL    Comment: Performed at [facility], [place] [address], [place], [place] [address]  Comprehensive metabolic panel     Status: Abnormal   Collection Time: 07/07/18  6:30 AM  Result Value Ref Range   Sodium 137 135 - 145 mmol/L   Potassium 3.6 3.5 - 5.1 mmol/L   Chloride 109 98 - 111 mmol/L   CO2 20 (L) 22 - 32 mmol/L   Glucose, Bld 93 70 - 99 mg/dL   BUN 21 8 - 23 mg/dL   Creatinine, Ser 1.62 (H) 0.61 - 1.24 mg/dL   Calcium 8.0 (L) 8.9 - 10.3 mg/dL   Total Protein 5.2 (L) 6.5 - 8.1 g/dL   Albumin 2.0 (L) 3.5 - 5.0 g/dL   AST 44 (H) 15 - 41 U/L   ALT 66 (H) 0 - 44 U/L   Alkaline Phosphatase 196 (H) 38 - 126 U/L   Total Bilirubin 5.8 (H) 0.3 - 1.2 mg/dL   GFR calc non Af Amer 38 (L) >60 mL/min   GFR calc Af Amer 44 (L)  >60 mL/min    Comment: (NOTE) The eGFR has been calculated using the CKD EPI equation. This calculation has not been validated in all clinical situations. eGFR's persistently <60 mL/min signify possible Chronic Kidney Disease.    Anion gap 8 5 - 15    Comment: Performed at [facility] [address], [place], [place] [address]  Magnesium     Status: None   Collection Time: 07/07/18  6:30 AM  Result Value Ref Range   Magnesium 1.9 1.7 - 2.4 mg/dL  Comment: Performed at [facility], [place] [address] [place], [place] [address]  Glucose, capillary     Status: Abnormal   Collection Time: 07/07/18  8:09 AM  Result Value Ref Range   Glucose-Capillary 100 (H) 70 - 99 mg/dL  Glucose, capillary     Status: Abnormal   Collection Time: 07/07/18 12:42 PM  Result Value Ref Range   Glucose-Capillary 150 (H) 70 - 99 mg/dL  Glucose, capillary     Status: Abnormal   Collection Time: 07/07/18  5:12 PM  Result Value Ref Range   Glucose-Capillary 126 (H) 70 - 99 mg/dL    Nm Hepatobiliary Liver Func  Result Date: 07/06/2018 CLINICAL DATA:  Abdominal pain with nausea and vomiting on off since 06/28/2018. Rising bilirubin. Abnormal liver function test. EXAM: NUCLEAR MEDICINE HEPATOBILIARY IMAGING TECHNIQUE: Sequential images of the abdomen were obtained out to 60 minutes following intravenous administration of radiopharmaceutical. RADIOPHARMACEUTICALS:  6.5 mCi Tc-61m Choletec IV COMPARISON:  Ultrasound, 07/01/2018. FINDINGS: There is prompt and homogeneous accumulation of radiotracer by the liver. Despite imaging over the course of 120 minutes, no radiotracer activity was noted within the common bile duct, gallbladder or small bowel. IMPRESSION: 1. Nonvisualization of the common bile duct, gallbladder and small bowel. This is likely due to hepatocellular dysfunction with depressed biliary excretion. Central biliary obstruction is also possible. Electronically Signed   By: DLajean ManesM.D.    On: 07/06/2018 14:13   Mr Abdomen Mrcp Wo Contrast  Result Date: 07/07/2018 CLINICAL DATA:  Jaundice, abdominal pain, fever EXAM: MRI ABDOMEN WITHOUT CONTRAST  (INCLUDING MRCP) TECHNIQUE: Multiplanar multisequence MR imaging of the abdomen was performed. Heavily T2-weighted images of the biliary and pancreatic ducts were obtained, and three-dimensional MRCP images were rendered by post processing. COMPARISON:  Right upper quadrant ultrasound dated 07/01/2018 FINDINGS: Motion degraded images. Lower chest: Small bilateral pleural effusions. Hepatobiliary: No morphologic findings of cirrhosis. 2.9 cm cyst in segment 4B (series 4/image 13). Mild intrahepatic and extrahepatic ductal dilatation. Multiple common duct stones, measuring up to 12 mm in the central common duct (series 13/image 6). Distended, irregular gallbladder with numerous layering gallstones and mild gallbladder wall thickening. Given the associated findings and irregular appearance of the gallbladder wall, this appearance is worrisome for acute cholecystitis. Pancreas:  Within normal limits. Spleen:  Within normal limits. Adrenals/Urinary Tract:  Adrenal glands are within normal limits. Kidneys are within normal limits.  No hydronephrosis. Stomach/Bowel: Stomach is notable for a tiny hiatal hernia. Duodenal diverticulum (series 4/image 25) along the posterior aspect of the 2nd/3rd portion of the duodenum. Visualized bowel is otherwise grossly unremarkable. Vascular/Lymphatic: 7.6 x 9.6 cm infrarenal abdominal aortic aneurysm (series 4/image 33), incompletely visualized. Suspected indwelling aorto bi-iliac stent. No suspicious abdominal lymphadenopathy. Other:  No abdominal ascites. Musculoskeletal: No focal osseous lesions. IMPRESSION: Distended, irregular gallbladder with cholelithiasis and gallbladder wall thickening, favoring acute cholecystitis. Associated intrahepatic and extrahepatic dilatation with multiple common duct stones measuring up to  12 mm. 7.6 x 9.6 cm infrarenal abdominal aortic aneurysm, incompletely visualized. Suspected aorto bi-iliac stents. Electronically Signed   By: SJulian HyM.D.   On: 07/07/2018 15:22   Mr 3d Recon At Scanner  Result Date: 07/07/2018 CLINICAL DATA:  Jaundice, abdominal pain, fever EXAM: MRI ABDOMEN WITHOUT CONTRAST  (INCLUDING MRCP) TECHNIQUE: Multiplanar multisequence MR imaging of the abdomen was performed. Heavily T2-weighted images of the biliary and pancreatic ducts were obtained, and three-dimensional MRCP images were rendered by post processing. COMPARISON:  Right upper quadrant ultrasound  dated 07/01/2018 FINDINGS: Motion degraded images. Lower chest: Small bilateral pleural effusions. Hepatobiliary: No morphologic findings of cirrhosis. 2.9 cm cyst in segment 4B (series 4/image 13). Mild intrahepatic and extrahepatic ductal dilatation. Multiple common duct stones, measuring up to 12 mm in the central common duct (series 13/image 6). Distended, irregular gallbladder with numerous layering gallstones and mild gallbladder wall thickening. Given the associated findings and irregular appearance of the gallbladder wall, this appearance is worrisome for acute cholecystitis. Pancreas:  Within normal limits. Spleen:  Within normal limits. Adrenals/Urinary Tract:  Adrenal glands are within normal limits. Kidneys are within normal limits.  No hydronephrosis. Stomach/Bowel: Stomach is notable for a tiny hiatal hernia. Duodenal diverticulum (series 4/image 25) along the posterior aspect of the 2nd/3rd portion of the duodenum. Visualized bowel is otherwise grossly unremarkable. Vascular/Lymphatic: 7.6 x 9.6 cm infrarenal abdominal aortic aneurysm (series 4/image 33), incompletely visualized. Suspected indwelling aorto bi-iliac stent. No suspicious abdominal lymphadenopathy. Other:  No abdominal ascites. Musculoskeletal: No focal osseous lesions. IMPRESSION: Distended, irregular gallbladder with cholelithiasis  and gallbladder wall thickening, favoring acute cholecystitis. Associated intrahepatic and extrahepatic dilatation with multiple common duct stones measuring up to 12 mm. 7.6 x 9.6 cm infrarenal abdominal aortic aneurysm, incompletely visualized. Suspected aorto bi-iliac stents. Electronically Signed   By: Julian Hy M.D.   On: 07/07/2018 15:22    Review of Systems  Constitutional: Positive for malaise/fatigue. Negative for chills, fever and weight loss.  HENT: Negative for nosebleeds.   Eyes: Negative for blurred vision.  Respiratory: Negative for shortness of breath.   Cardiovascular: Negative for chest pain, palpitations, orthopnea and PND.       Denies DOE  Gastrointestinal:       See hpi  Genitourinary: Negative for dysuria and hematuria.       See hpi  Musculoskeletal: Negative.   Skin: Negative for itching and rash.  Neurological: Negative for dizziness, focal weakness, seizures, loss of consciousness and headaches.       Denies TIAs, amaurosis fugax  Endo/Heme/Allergies: Does not bruise/bleed easily.  Psychiatric/Behavioral: The patient is not nervous/anxious.    Blood pressure 126/77, pulse (!) 107, temperature 97.6 F (36.4 C), temperature source Oral, resp. rate 18, height '5\' 10"'  (1.778 m), weight 103.1 kg, SpO2 94 %. Physical Exam  Vitals reviewed. Constitutional: He is oriented to person, place, and time. He appears well-developed and well-nourished. No distress.  Looks good  HENT:  Head: Normocephalic and atraumatic.  Right Ear: External ear normal.  Left Ear: External ear normal.  Eyes: Conjunctivae are normal. Scleral icterus (mild) is present.  Neck: Normal range of motion. Neck supple. No tracheal deviation present. No thyromegaly present.  Cardiovascular: Normal rate, normal heart sounds and intact distal pulses. An irregular rhythm present.  Respiratory: Effort normal and breath sounds normal. No stridor. No respiratory distress. He has no wheezes.  GI:  Soft. He exhibits no distension. There is no tenderness. There is no rebound and no guarding.  Musculoskeletal: He exhibits no edema or tenderness.  Lymphadenopathy:    He has no cervical adenopathy.  Neurological: He is alert and oriented to person, place, and time. He exhibits normal muscle tone.  Skin: Skin is warm and dry. No rash noted. He is not diaphoretic. No erythema. No pallor.  Psychiatric: He has a normal mood and affect. His behavior is normal. Judgment and thought content normal.    Assessment/Plan: Syncopal episode Elevated LFTs Choledocholithiasis Cholelithiasis Probable cholecystitis E. coli bacteremia Klebsiella-enterococcus UTI Acute kidney injury-resolved Atrial fibrillation Hypertension  Mallory-Weiss tear Erosive duodenal apathy Anemia Moderate protein calorie malnutrition  I suspect the syncopal episode is probably due to bacteremia due to underlying choledocholithiasis but agrees he needs cardiology evaluation.   Patient needs ERCP to clear common bile duct  Even though he looks much better in person his labs and cultures paint a different picture  Given the bacteremia, protein calorie malnutrition, need for ischemic cardiology work-up, I would not recommend same admission cholecystectomy.  Given the fact that he is also at least 6 days into this process and probably longer there may be significant edema and inflammation around the gallbladder which would make cholecystectomy higher risk at this time  -Therefore recommend percutaneous cholecystostomy tube placement -Outpatient cardiology evaluation prior to interval cholecystectomy -Continue antibiotics  Patient and family given diagram of hepatobiliary anatomy.  Discussed common bile duct stones and gallbladder disease.  Discussed his hospitalization and results to date.  Discussed the typical management of a cholecystostomy tube and ultimate cholecystectomy.  Explained to the family and patient that he  would need to have cardiology clearance prior to cholecystectomy in the future  Tammye Kahler M. Redmond Pulling, MD, FACS General, Bariatric, & Minimally Invasive Surgery [facility]   Greer Pickerel 07/07/2018, 7:43 PM

## 2018-07-07 NOTE — Progress Notes (Signed)
Patient ID: Ernest Parker, male   DOB: [birth year], 84 y.o.   MRN: [id number]  PROGRESS NOTE    Ernest Parker  EGB:[id number] DOB: 01/26/1934 DOA: 07/01/2018 PCP: Ernest Carol, MD   Brief Narrative:  84 year old male with history of A. fib on Eliquis, status post pacemaker in 09/2017, remote prostate cancer, hypertension, hyperlipidemia presented with  unresponsiveness.  CT of the head was negative for bleed.  Troponin was mildly elevated.  Cardiology was consulted.  He was started on IV fluids for AKI.  LFTs were mildly elevated.  GI was consulted. He was planned for cardiac catheterization on 07/04/2018. Patient had chills on 07/03/2018 and went into A. fib with RVR.  He was started on empiric antibiotics and had to be placed on Cardizem drip. He also had upper GI bleeding.  He had EGD with EUS on 07/05/2018.   Assessment & Plan:   Principal Problem:   Syncope and collapse Active Problems:   Atrial fibrillation with rapid ventricular response (HCC)   Essential hypertension   History of prostate cancer   Elevated LFTs   CKD (chronic kidney disease) stage 3, GFR 30-59 ml/min (HCC)   PAF (paroxysmal atrial fibrillation) (HCC)   Elevated troponin   Mallory-Weiss syndrome   Cirrhosis of liver without ascites (HCC)   E coli bacteremia  E. coli bacteremia -Possibly from urinary tract infection.  Cannot rule out biliary source. -Urine culture is growing Klebsiella pneumoniae and Enterococcus faecalis though.  Foley catheter was changed on 07/03/2018.  Patient had chills on 07/03/2018 and blood cultures drawn at that time is positive for E. coli.  Blood cultures on admission was negative.  Follow repeat blood cultures from 07/06/2018. - If there is persistent bacteremia, there might be a concern for pacemaker infection as well. -Continue Levaquin as urine culture is growing Enterococcus faecalis as well sensitive to Levaquin.  UTI -Probably associated with chronic indwelling Foley  catheter. -Probably evolving on admission.  Urine culture done at the time of admission from the indwelling Foley catheter was positive for Klebsiella pneumoniae and Enterococcus faecalis.  Foley catheter was changed on 07/03/2018.  Repeat urine cultures from 07/03/2018 is growing the same and organisms.  Syncope with elevated troponins -Unclear etiology.  Had prolonged confusion for 20 to 30 minutes -Might be related to orthostatic hypotension: Lasix on hold.  Discontinue IV fluids -Echo showed EF of 55 to 60% -Cardiology had planned for cardiac catheterization but this has been postponed because of bacteremia.  Outpatient follow-up with cardiology  Paroxysmal atrial fibrillation/flutter with rapid ventricular rate; bradycardia status post pacemaker in 1/19 -had to be put on Cardizem drip overnight of 07/03/2018.  Currently rate controlled.  On oral Cardizem and beta-blocker.  Outpatient follow-up with cardiology.  Heparin drip discontinued because of GI bleed.  Will wait for further GI recommendations before starting Eliquis or heparin.  Probable upper GI bleeding -GI following.  Continue Protonix.  Status post EGD with EUS on 07/05/2018 which showed esophageal ulcers, Barrett's esophagus and healing Mallory-Weiss tear.  Elevated LFTs -Right upper quadrant ultrasound showed cholelithiasis without cholecystitis -AST/ALT are improving but alkaline phosphatase and bili total has worsened today.  Monitor.  Follow further GI recommendations.  Abnormal gallbladder -Ultrasound showed possible underlying malignant process versus sludge.  -Status post EUS done on 07/05/2018: Follow further GI recommendations.  HIDA scan was inconclusive.  -Spoke to Dr. Delrae Rend on phone today who recommended MRI of the liver without contrast because of his chronic kidney disease  Acute kidney  injury on chronic kidney disease stage III -Improved.  Creatinine today is 1.62 baseline creatinine is around 1.7 -Lasix  on hold.  Repeat a.m. creatinine.  Off IV fluids.  Monitor strict input and output  Chronic urinary retention with chronic indwelling Foley's catheter -Outpatient follow-up with urology.  Urology had recommended to replace Foley's catheter while inpatient.  Foley catheter was changed on 06/30/2018.  Thrombocytopenia -Questionable cause.  Monitor  DVT prophylaxis: Heparin discontinued because of probable upper GI bleeding Code Status: DNR Family Communication: Daughter and son at bedside Disposition Plan: Depends on clinical outcome  Consultants: Cardiology/GI  Procedures:  Echo Study Conclusions  - Left ventricle: The cavity size was normal. There was moderate   concentric hypertrophy. Systolic function was normal. The   estimated ejection fraction was in the range of 55% to 60%. Wall   motion was normal; there were no regional wall motion   abnormalities. - Aortic valve: There was trivial regurgitation. - Mitral valve: Mildly calcified annulus. There was mild   regurgitation. - Left atrium: The atrium was moderately dilated.  EGD and EUS on 07/05/2018 Findings:      ENDOSCOPIC FINDING: :      No gross lesions were noted in the proximal esophagus.      Three linear esophageal ulcers with no bleeding and stigmata of recent       bleeding were found in the mid esophagus. Biopsies were taken with a       cold forceps for histology.      Two tongues of salmon-colored mucosa were present from 30 to 36 cm. The       maximum longitudinal extent of these esophageal mucosal changes was 6 cm       in length. Biopsies were taken with a cold forceps for histology.      A medium non-bleeding Mallory-Weiss tear was found but looks to be       healing right at the Z-line junction into the hiatal hernia.      The Z-line was irregular and was found 36 cm from the incisors.      A medium-sized hiatal hernia was found. The proximal extent of the       gastric folds (end of tubular esophagus)  was 37 cm from the incisors.       The hiatal narrowing was 42 cm from the incisors.      No gross lesions were noted in the entire examined stomach otherwise.       Biopsies were taken with a cold forceps for histology and Helicobacter       pylori testing.      Multiple diffuse, small erosions were found in the duodenal bulb.      A medium diverticulum where the ampullary diverticulum was noted.      The major papilla was normal but completely intradiverticular.      No gross lesions were noted in the third portion of the duodenum.      ENDOSONOGRAPHIC FINDING: :      Extensive hyperechoic material consistent with sludge was visualized       endosonographically in the gallbladder. However, this obscures the       common hepatic duct region due to the shadowing defects that are noted.      There was dilation in the middle third of the main bile duct. There is       obscuring as a result of the gallbladder itself of the upper third of  the main bile duct. The main CBD in the head of the pancreas before the       cystic duct insertion is 2.6 mm -> 3.4 mm.      Moderate hyperechoic material consistent with sludge was visualized       endosonographically in the presumed middle third of the main bile duct.       This was adjacent to the sludge/debris that was within the gallbladder       region as well. There is subsequent dilation at that region.      Pancreatic parenchymal abnormalities were noted in the genu of the       pancreas, pancreatic body and pancreatic tail. These consisted of       atrophy.      The pancreatic duct had a normal endosonographic appearance in the       pancreatic head (PD =1.1 mm -> 1.7 mm), genu of the pancreas (1.2 mm),       body of the pancreas (0.8 mm), and the tail of the pancreas (0.5 mm).      Endosonographic imaging of the ampulla showed no intramural       (subepithelial) lesion or mass.      A cyst was found in the left lobe of the liver and  measured 28 mm by 26       mm in maximal cross-sectional diameter. The cyst was anechoic. It was       thinly septated.      Endosonographic imaging in the rest of the visualized portion of the       liver showed no lesion.      A small amount of fluid - ascites was visualized as an anechoic feature,       was found in the peritoneal cavity.      The celiac region was visualized. Impression:               EGD Impression:                           - No gross lesions in proximal esophagus.                           - Non-bleeding esophageal ulcers in middle                            esophagus. Biopsied.                           - Salmon-colored mucosa suspicious for long-segment                            Barrett's esophagus and classified as Barrett's                            stage C0-M6 per [place] criteria. Biopsied to                            confirm diagnosis.                           - Mallory-Weiss tear that is healing at the distal  GE Junction..                           - Z-line irregular, 36 cm from the incisors.                           - Medium-sized hiatal hernia.                           - No other gross lesions in the stomach. Biopsied.                           - Erosive duodenopathy.                           - Non-bleeding duodenal diverticulum.                           - Normal major papilla but is located entirely                            intradiverticular.                           - No gross lesions in the third portion of the                            duodenum.                           EUS Impression:                           - Hyperechoic material consistent with sludge and                            debris was visualized endosonographically in the                            gallbladder. There is significant obscuring of the                            common hepatic duct region as a result of this.                            - There was dilation in the middle third of the                            main bile duct and there was evidence/concern for                            hyperechoic material consistent with sludge was                            visualized endosonographically in this region,  however, the overt cystic duct insertion and then                            the development of the gallbladder obscuring                            shadowing defects makes things difficult to see,                            but sludge is noted.                           - Pancreatic parenchymal abnormalities consisting                            of atrophy were noted in the genu of the pancreas,                            pancreatic body and pancreatic tail. But the duct                            itself, had a normal endosonographic appearance in                            the pancreatic head, genu of the pancreas, body of                            the pancreas and tail of the pancreas.                           - A cyst was found in the left lobe of the liver                            and measured 28 mm by 26 mm.                           - Ascites was found on endosonographic examination                            of the peritoneal cavity. Recommendation:           - The patient will be observed post-procedure,                            until all discharge criteria are met.                           - Return patient to hospital ward for ongoing care.                           - Observe patient's clinical course.                           - Regular Diet OK for today.                           -  Will plan to proceed with HIDA to evaluate for                            evidence of cholecystitis and also to evaluate the                            biliary tree in regards to positioning of                            radiotracer into the duodenum for evaluation of                             partial obstruction or not.                           - Based on the patient's kidney function, and as he                            has not had any cross-sectional imaging in months,                            he needs this to be performed. Ideally would get a                            CT-Abdmen with IV contrast vs a MRI-Abdomen with                            contrast. If kidney function is stable and no plan                            for urgent catheterization then would have medical                            team order scan on Saturday.                           - Reasonable on Saturday to discuss case with                            surgery as HIDA is being completed and his                            cross-sectional CT is performed.                           - Based on his liver function testing and the                            findings above will consider the role of                            cholecystectomy, cholecystostomy tube placement,  and role of ERCP moving forward.                           - The findings and recommendations were discussed                            with the patient.                           - The findings and recommendations were discussed                            with the patient's family.                           - The findings and recommendations were discussed                            with the referring physician.                           - GI MD team covering over the weekend.  Antimicrobials: Levaquin   Subjective: Patient seen and examined at bedside.  No overnight fever, nausea, vomiting, worsening abdominal pain or diarrhea.  objective: Vitals:   07/06/18 1234 07/06/18 2014 07/06/18 2207 07/07/18 0538  BP: (!) 149/84 (!) 144/79 135/71 132/68  Pulse: 68 71 67 62  Resp:  18  18  Temp:  97.7 F (36.5 C)  98.6 F (37 C)  TempSrc:  Oral  Oral  SpO2: 96% 93%  95%  Weight:    103.1 kg  Height:         Intake/Output Summary (Last 24 hours) at 07/07/2018 0904 Last data filed at 07/07/2018 0725 Gross per 24 hour  Intake 1486.48 ml  Output 2100 ml  Net -613.52 ml   Filed Weights   07/05/18 0548 07/06/18 0353 07/07/18 0538  Weight: 101.2 kg 103 kg 103.1 kg    Examination:  General exam: No distress.  Calm and comfortable Respiratory system: Bilateral decreased breath sounds at bases, with scattered crackles no wheezing  cardiovascular system: S1 & S2 heard, rate controlled gastrointestinal system: Abdomen is nondistended, soft and nontender. Normal bowel sounds heard. Extremities: No cyanosis.  No edema Data Reviewed: I have personally reviewed following labs and imaging studies  CBC: Recent Labs  Lab 07/03/18 0548  07/04/18 0505 07/04/18 1632 07/05/18 0627 07/06/18 0601 07/07/18 0630  WBC 6.5  --  11.3*  --  7.3 5.7 6.6  NEUTROABS 5.4  --  10.0*  --  5.7 4.5 5.1  HGB 10.9*   < > 9.6* 8.6* 9.3* 9.3* 9.9*  HCT 33.3*   < > 30.8* 28.0* 30.7* 30.1* 30.9*  MCV 89.3  --  91.9  --  92.7 91.2 90.1  PLT 120*  --  97*  --  98* 109* 154   < > = values in this interval not displayed.   Basic Metabolic Panel: Recent Labs  Lab 07/03/18 0548 07/04/18 0505 07/05/18 0627 07/06/18 0601 07/07/18 0630  NA 136 136 140 138 137  K 3.5 4.1 3.9 3.7 3.6  CL 104 104 108 109 109  CO2 '23 22 23 ' 21* 20*  GLUCOSE 119* 172* 106*  102* 93  BUN 30* 33* 31* 24* 21  CREATININE 1.79* 2.14* 1.87* 1.67* 1.62*  CALCIUM 8.4* 8.0* 8.4* 8.1* 8.0*  MG  --  1.9 2.2 2.1 1.9   GFR: Estimated Creatinine Clearance: 42.3 mL/min (A) (by C-G formula based on SCr of 1.62 mg/dL (H)). Liver Function Tests: Recent Labs  Lab 07/03/18 0548 07/04/18 0505 07/05/18 0627 07/06/18 0601 07/07/18 0630  AST 65* 43* 38 45* 44*  ALT 191* 123* 95* 79* 66*  ALKPHOS 177* 153* 152* 193* 196*  BILITOT 3.2* 3.2* 3.7* 4.2* 5.8*  PROT 5.8* 5.5* 5.5* 5.2* 5.2*  ALBUMIN 2.3* 2.2* 2.2* 2.0* 2.0*   No results for  input(s): LIPASE, AMYLASE in the last 168 hours. Recent Labs  Lab 07/01/18 1445 07/01/18 2059  AMMONIA 59* 41*   Coagulation Profile: Recent Labs  Lab 07/01/18 1647 07/02/18 0050 07/03/18 0548  INR 1.13 1.43 1.11   Cardiac Enzymes: Recent Labs  Lab 07/01/18 1445 07/01/18 2059 07/02/18 0050 07/02/18 0748 07/02/18 1435 07/02/18 1903  CKTOTAL  --  196  --   --   --   --   TROPONINI 0.89*  --  0.93* 0.52* 0.37* 0.30*   BNP (last 3 results) No results for input(s): PROBNP in the last 8760 hours. HbA1C: No results for input(s): HGBA1C in the last 72 hours. CBG: Recent Labs  Lab 07/05/18 2127 07/06/18 0728 07/06/18 1230 07/06/18 1618 07/07/18 0809  GLUCAP 99 99 88 120* 100*   Lipid Profile: No results for input(s): CHOL, HDL, LDLCALC, TRIG, CHOLHDL, LDLDIRECT in the last 72 hours. Thyroid Function Tests: No results for input(s): TSH, T4TOTAL, FREET4, T3FREE, THYROIDAB in the last 72 hours. Anemia Panel: No results for input(s): VITAMINB12, FOLATE, FERRITIN, TIBC, IRON, RETICCTPCT in the last 72 hours. Sepsis Labs: No results for input(s): PROCALCITON, LATICACIDVEN in the last 168 hours.  Recent Results (from the past 240 hour(s))  Culture, Urine     Status: Abnormal   Collection Time: 07/02/18  7:37 AM  Result Value Ref Range Status   Specimen Description URINE, RANDOM  Final   Special Requests   Final    NONE Performed at [facility], [address], [place], [place] [address]    Culture (A)  Final    >=100,000 COLONIES/mL KLEBSIELLA PNEUMONIAE 40,000 COLONIES/mL ENTEROCOCCUS FAECALIS    Report Status 07/05/2018 FINAL  Final   Organism ID, Bacteria KLEBSIELLA PNEUMONIAE (A)  Final   Organism ID, Bacteria ENTEROCOCCUS FAECALIS (A)  Final      Susceptibility   Enterococcus faecalis - MIC*    AMPICILLIN <=2 SENSITIVE Sensitive     LEVOFLOXACIN 0.5 SENSITIVE Sensitive     NITROFURANTOIN <=16 SENSITIVE Sensitive     VANCOMYCIN <=0.5 SENSITIVE  Sensitive     * 40,000 COLONIES/mL ENTEROCOCCUS FAECALIS   Klebsiella pneumoniae - MIC*    AMPICILLIN >=32 RESISTANT Resistant     CEFAZOLIN <=4 SENSITIVE Sensitive     CEFTRIAXONE <=1 SENSITIVE Sensitive     CIPROFLOXACIN <=0.25 SENSITIVE Sensitive     GENTAMICIN <=1 SENSITIVE Sensitive     IMIPENEM <=0.25 SENSITIVE Sensitive     NITROFURANTOIN 128 RESISTANT Resistant     TRIMETH/SULFA <=20 SENSITIVE Sensitive     AMPICILLIN/SULBACTAM 16 INTERMEDIATE Intermediate     PIP/TAZO 16 SENSITIVE Sensitive     Extended ESBL NEGATIVE Sensitive     * >=100,000 COLONIES/mL KLEBSIELLA PNEUMONIAE  Culture, blood (routine x 2)     Status: None (Preliminary result)   Collection  Time: 07/02/18 10:45 PM  Result Value Ref Range Status   Specimen Description BLOOD LEFT ANTECUBITAL  Final   Special Requests   Final    BOTTLES DRAWN AEROBIC ONLY Blood Culture results may not be optimal due to an inadequate volume of blood received in culture bottles   Culture   Final    NO GROWTH 4 DAYS Performed at [facility] [address] [place], [place] [address]    Report Status PENDING  Incomplete  Culture, blood (routine x 2)     Status: None (Preliminary result)   Collection Time: 07/02/18 10:51 PM  Result Value Ref Range Status   Specimen Description BLOOD LEFT FOREARM  Final   Special Requests   Final    BOTTLES DRAWN AEROBIC AND ANAEROBIC Blood Culture results may not be optimal due to an inadequate volume of blood received in culture bottles   Culture   Final    NO GROWTH 4 DAYS Performed at [facility], [place] [address], [place], [place] [address]    Report Status PENDING  Incomplete  Culture, blood (routine x 2)     Status: Abnormal   Collection Time: 07/03/18  4:15 PM  Result Value Ref Range Status   Specimen Description BLOOD RIGHT ANTECUBITAL  Final   Special Requests   Final    BOTTLES DRAWN AEROBIC AND ANAEROBIC Blood Culture adequate volume   Culture  Setup Time   Final     GRAM NEGATIVE RODS IN BOTH AEROBIC AND ANAEROBIC BOTTLES CRITICAL RESULT CALLED TO, READ BACK BY AND VERIFIED WITH: Zoila Shutter [id number] MLM Performed at [facility], [place] [address], [place], [place] [address]    Culture ESCHERICHIA COLI (A)  Final   Report Status 07/06/2018 FINAL  Final   Organism ID, Bacteria ESCHERICHIA COLI  Final      Susceptibility   Escherichia coli - MIC*    AMPICILLIN 4 SENSITIVE Sensitive     CEFAZOLIN <=4 SENSITIVE Sensitive     CEFEPIME <=1 SENSITIVE Sensitive     CEFTAZIDIME <=1 SENSITIVE Sensitive     CEFTRIAXONE <=1 SENSITIVE Sensitive     CIPROFLOXACIN <=0.25 SENSITIVE Sensitive     GENTAMICIN <=1 SENSITIVE Sensitive     IMIPENEM <=0.25 SENSITIVE Sensitive     TRIMETH/SULFA <=20 SENSITIVE Sensitive     AMPICILLIN/SULBACTAM <=2 SENSITIVE Sensitive     PIP/TAZO <=4 SENSITIVE Sensitive     Extended ESBL NEGATIVE Sensitive     * ESCHERICHIA COLI  Blood Culture ID Panel (Reflexed)     Status: Abnormal   Collection Time: 07/03/18  4:15 PM  Result Value Ref Range Status   Enterococcus species NOT DETECTED NOT DETECTED Final   Listeria monocytogenes NOT DETECTED NOT DETECTED Final   Staphylococcus species NOT DETECTED NOT DETECTED Final   Staphylococcus aureus (BCID) NOT DETECTED NOT DETECTED Final   Streptococcus species NOT DETECTED NOT DETECTED Final   Streptococcus agalactiae NOT DETECTED NOT DETECTED Final   Streptococcus pneumoniae NOT DETECTED NOT DETECTED Final   Streptococcus pyogenes NOT DETECTED NOT DETECTED Final   Acinetobacter baumannii NOT DETECTED NOT DETECTED Final   Enterobacteriaceae species DETECTED (A) NOT DETECTED Final    Comment: Enterobacteriaceae represent a large family of gram-negative bacteria, not a single organism. CRITICAL RESULT CALLED TO, READ BACK BY AND VERIFIED WITH: PHARMD V BRYK [id number] MLM    Enterobacter cloacae complex NOT DETECTED NOT DETECTED Final   Escherichia coli DETECTED (A) NOT  DETECTED  Final    Comment: CRITICAL RESULT CALLED TO, READ BACK BY AND VERIFIED WITH: PHARMD V BRYK [id number] MLM    Klebsiella oxytoca NOT DETECTED NOT DETECTED Final   Klebsiella pneumoniae NOT DETECTED NOT DETECTED Final   Proteus species NOT DETECTED NOT DETECTED Final   Serratia marcescens NOT DETECTED NOT DETECTED Final   Carbapenem resistance NOT DETECTED NOT DETECTED Final   Haemophilus influenzae NOT DETECTED NOT DETECTED Final   Neisseria meningitidis NOT DETECTED NOT DETECTED Final   Pseudomonas aeruginosa NOT DETECTED NOT DETECTED Final   Candida albicans NOT DETECTED NOT DETECTED Final   Candida glabrata NOT DETECTED NOT DETECTED Final   Candida krusei NOT DETECTED NOT DETECTED Final   Candida parapsilosis NOT DETECTED NOT DETECTED Final   Candida tropicalis NOT DETECTED NOT DETECTED Final    Comment: Performed at [facility], [place] [address], [place], [place] [address]  Culture, blood (routine x 2)     Status: Abnormal   Collection Time: 07/03/18  4:30 PM  Result Value Ref Range Status   Specimen Description BLOOD LEFT HAND  Final   Special Requests   Final    BOTTLES DRAWN AEROBIC AND ANAEROBIC Blood Culture adequate volume   Culture  Setup Time   Final    GRAM NEGATIVE RODS IN BOTH AEROBIC AND ANAEROBIC BOTTLES CRITICAL VALUE NOTED.  VALUE IS CONSISTENT WITH PREVIOUSLY REPORTED AND CALLED VALUE.    Culture (A)  Final    ESCHERICHIA COLI SUSCEPTIBILITIES PERFORMED ON PREVIOUS CULTURE WITHIN THE LAST 5 DAYS. Performed at [facility], [place] [address], [place], [place] [address]    Report Status 07/06/2018 FINAL  Final  Culture, Urine     Status: Abnormal   Collection Time: 07/03/18  5:08 PM  Result Value Ref Range Status   Specimen Description URINE, CATHETERIZED  Final   Special Requests   Final    NONE Performed at [facility], [place] [address], [place], [place] [address]    Culture (A)  Final    >=100,000 COLONIES/mL KLEBSIELLA  PNEUMONIAE >=100,000 COLONIES/mL ENTEROCOCCUS FAECALIS    Report Status 07/07/2018 FINAL  Final   Organism ID, Bacteria KLEBSIELLA PNEUMONIAE (A)  Final   Organism ID, Bacteria ENTEROCOCCUS FAECALIS (A)  Final      Susceptibility   Enterococcus faecalis - MIC*    AMPICILLIN <=2 SENSITIVE Sensitive     LEVOFLOXACIN 1 SENSITIVE Sensitive     NITROFURANTOIN <=16 SENSITIVE Sensitive     VANCOMYCIN 1 SENSITIVE Sensitive     * >=100,000 COLONIES/mL ENTEROCOCCUS FAECALIS   Klebsiella pneumoniae - MIC*    AMPICILLIN >=32 RESISTANT Resistant     CEFAZOLIN <=4 SENSITIVE Sensitive     CEFTRIAXONE <=1 SENSITIVE Sensitive     CIPROFLOXACIN <=0.25 SENSITIVE Sensitive     GENTAMICIN <=1 SENSITIVE Sensitive     IMIPENEM <=0.25 SENSITIVE Sensitive     NITROFURANTOIN 64 INTERMEDIATE Intermediate     TRIMETH/SULFA <=20 SENSITIVE Sensitive     AMPICILLIN/SULBACTAM 16 INTERMEDIATE Intermediate     PIP/TAZO 16 SENSITIVE Sensitive     Extended ESBL NEGATIVE Sensitive     * >=100,000 COLONIES/mL KLEBSIELLA PNEUMONIAE  Culture, blood (routine x 2)     Status: None (Preliminary result)   Collection Time: 07/06/18  8:41 AM  Result Value Ref Range Status   Specimen Description BLOOD RIGHT ARM  Final   Special Requests   Final    BOTTLES DRAWN AEROBIC AND ANAEROBIC Blood  Culture adequate volume   Culture  Setup Time   Final    GRAM POSITIVE COCCI ANAEROBIC BOTTLE ONLY Organism ID to follow Performed at [facility], [place] [address] [place], [place] [address]    Culture GRAM POSITIVE COCCI  Final   Report Status PENDING  Incomplete         Radiology Studies: Nm Hepatobiliary Liver Func  Result Date: 07/06/2018 CLINICAL DATA:  Abdominal pain with nausea and vomiting on off since 06/28/2018. Rising bilirubin. Abnormal liver function test. EXAM: NUCLEAR MEDICINE HEPATOBILIARY IMAGING TECHNIQUE: Sequential images of the abdomen were obtained out to 60 minutes following intravenous  administration of radiopharmaceutical. RADIOPHARMACEUTICALS:  6.5 mCi Tc-57m Choletec IV COMPARISON:  Ultrasound, 07/01/2018. FINDINGS: There is prompt and homogeneous accumulation of radiotracer by the liver. Despite imaging over the course of 120 minutes, no radiotracer activity was noted within the common bile duct, gallbladder or small bowel. IMPRESSION: 1. Nonvisualization of the common bile duct, gallbladder and small bowel. This is likely due to hepatocellular dysfunction with depressed biliary excretion. Central biliary obstruction is also possible. Electronically Signed   By: DLajean ManesM.D.   On: 07/06/2018 14:13        Scheduled Meds: . diltiazem  120 mg Oral Daily  . docusate sodium  100 mg Oral BID  . insulin aspart  0-9 Units Subcutaneous TID WC  . metoprolol succinate  25 mg Oral BID  . pantoprazole  40 mg Oral BID  . sodium chloride flush  3 mL Intravenous Q12H  . tamsulosin  0.4 mg Oral BID   Continuous Infusions: . sodium chloride 1,000 mL (07/06/18 1419)  . diltiazem (CARDIZEM) infusion Stopped (07/04/18 0945)  . levofloxacin (LEVAQUIN) IV 500 mg (07/06/18 1420)     LOS: 5 days        KAline August MD [facility] Pager [phone] If 7PM-7AM, please contact night-coverage [email or web address] Password [email or web address] 07/07/2018, 9:04 AM

## 2018-07-07 NOTE — Progress Notes (Signed)
PHARMACY - PHYSICIAN COMMUNICATION CRITICAL VALUE ALERT - BLOOD CULTURE IDENTIFICATION (BCID)  Ernest Parker is an 84 y.o. male who presented to [facility] on 07/01/2018 with unresponsiveness   Assessment:  Pt was found to have E.coli UTI and enterococcus faecalis bacteremia. Currently on Levaquin. 1/4 BCx now growing GPC which BCID identified as coag negative staph. This could likely represent contamination.   Name of physician (or Provider) Contacted: Dr. Starla Link  Current antibiotics: Levaquin   Changes to prescribed antibiotics recommended:  Patient is on recommended antibiotics - No changes needed  Results for orders placed or performed during the hospital encounter of 07/01/18  Blood Culture ID Panel (Reflexed) (Collected: 07/06/2018  8:41 AM)  Result Value Ref Range   Enterococcus species NOT DETECTED NOT DETECTED   Listeria monocytogenes NOT DETECTED NOT DETECTED   Staphylococcus species DETECTED (A) NOT DETECTED   Staphylococcus aureus (BCID) NOT DETECTED NOT DETECTED   Methicillin resistance DETECTED (A) NOT DETECTED   Streptococcus species NOT DETECTED NOT DETECTED   Streptococcus agalactiae NOT DETECTED NOT DETECTED   Streptococcus pneumoniae NOT DETECTED NOT DETECTED   Streptococcus pyogenes NOT DETECTED NOT DETECTED   Acinetobacter baumannii NOT DETECTED NOT DETECTED   Enterobacteriaceae species NOT DETECTED NOT DETECTED   Enterobacter cloacae complex NOT DETECTED NOT DETECTED   Escherichia coli NOT DETECTED NOT DETECTED   Klebsiella oxytoca NOT DETECTED NOT DETECTED   Klebsiella pneumoniae NOT DETECTED NOT DETECTED   Proteus species NOT DETECTED NOT DETECTED   Serratia marcescens NOT DETECTED NOT DETECTED   Haemophilus influenzae NOT DETECTED NOT DETECTED   Neisseria meningitidis NOT DETECTED NOT DETECTED   Pseudomonas aeruginosa NOT DETECTED NOT DETECTED   Candida albicans NOT DETECTED NOT DETECTED   Candida glabrata NOT DETECTED NOT DETECTED   Candida krusei NOT  DETECTED NOT DETECTED   Candida parapsilosis NOT DETECTED NOT DETECTED   Candida tropicalis NOT DETECTED NOT DETECTED    Albertina Parr, PharmD., BCPS Clinical Pharmacist Clinical phone for 07/07/18 until 3:30pm: [phone] If after 3:30pm, please refer to [facility] for unit-specific pharmacist

## 2018-07-07 NOTE — Progress Notes (Signed)
  Vital Signs MEWS/VS Documentation      07/06/2018 2044 07/06/2018 2207 07/07/2018 0538 07/07/2018 1243   MEWS Score:  0  0  0  1   MEWS Score Color:  Green  Green  Green  Green   Resp:  -  -  18  18   Pulse:  -  67  62  (!) 107   BP:  -  135/71  132/68  126/77   Temp:  -  -  98.6 F (37 C)  97.6 F (36.4 C)   O2 Device:  -  -  Room Air  Room Air   Level of Consciousness:  Alert  -  -  -       Pt to MRI. Will re-assess upon his return to [facility].  Gladis Riffle 07/07/2018,2:20 PM

## 2018-07-08 ENCOUNTER — Encounter (HOSPITAL_COMMUNITY): Payer: Self-pay | Admitting: Gastroenterology

## 2018-07-08 DIAGNOSIS — K8050 Calculus of bile duct without cholangitis or cholecystitis without obstruction: Secondary | ICD-10-CM

## 2018-07-08 DIAGNOSIS — K226 Gastro-esophageal laceration-hemorrhage syndrome: Secondary | ICD-10-CM

## 2018-07-08 DIAGNOSIS — R7881 Bacteremia: Secondary | ICD-10-CM

## 2018-07-08 LAB — GLUCOSE, CAPILLARY
GLUCOSE-CAPILLARY: 107 mg/dL — AB (ref 70–99)
GLUCOSE-CAPILLARY: 110 mg/dL — AB (ref 70–99)
GLUCOSE-CAPILLARY: 128 mg/dL — AB (ref 70–99)
Glucose-Capillary: 119 mg/dL — ABNORMAL HIGH (ref 70–99)

## 2018-07-08 LAB — CBC WITH DIFFERENTIAL/PLATELET
ABS IMMATURE GRANULOCYTES: 0.22 10*3/uL — AB (ref 0.00–0.07)
Basophils Absolute: 0 10*3/uL (ref 0.0–0.1)
Basophils Relative: 0 %
Eosinophils Absolute: 0.2 10*3/uL (ref 0.0–0.5)
Eosinophils Relative: 3 %
HEMATOCRIT: 32.8 % — AB (ref 39.0–52.0)
Hemoglobin: 10.4 g/dL — ABNORMAL LOW (ref 13.0–17.0)
IMMATURE GRANULOCYTES: 3 %
Lymphocytes Relative: 12 %
Lymphs Abs: 0.8 10*3/uL (ref 0.7–4.0)
MCH: 28.3 pg (ref 26.0–34.0)
MCHC: 31.7 g/dL (ref 30.0–36.0)
MCV: 89.1 fL (ref 80.0–100.0)
MONOS PCT: 7 %
Monocytes Absolute: 0.5 10*3/uL (ref 0.1–1.0)
NEUTROS ABS: 5.2 10*3/uL (ref 1.7–7.7)
NEUTROS PCT: 75 %
NRBC: 0 % (ref 0.0–0.2)
PLATELETS: 198 10*3/uL (ref 150–400)
RBC: 3.68 MIL/uL — ABNORMAL LOW (ref 4.22–5.81)
RDW: 15.5 % (ref 11.5–15.5)
WBC: 6.9 10*3/uL (ref 4.0–10.5)

## 2018-07-08 LAB — COMPREHENSIVE METABOLIC PANEL
ALT: 60 U/L — AB (ref 0–44)
AST: 42 U/L — AB (ref 15–41)
Albumin: 2 g/dL — ABNORMAL LOW (ref 3.5–5.0)
Alkaline Phosphatase: 203 U/L — ABNORMAL HIGH (ref 38–126)
Anion gap: 6 (ref 5–15)
BUN: 17 mg/dL (ref 8–23)
CHLORIDE: 110 mmol/L (ref 98–111)
CO2: 22 mmol/L (ref 22–32)
Calcium: 8 mg/dL — ABNORMAL LOW (ref 8.9–10.3)
Creatinine, Ser: 1.61 mg/dL — ABNORMAL HIGH (ref 0.61–1.24)
GFR calc Af Amer: 44 mL/min — ABNORMAL LOW (ref >60)
GFR, EST NON AFRICAN AMERICAN: 38 mL/min — AB (ref >60)
GLUCOSE: 128 mg/dL — AB (ref 70–99)
POTASSIUM: 3.5 mmol/L (ref 3.5–5.1)
Sodium: 138 mmol/L (ref 135–145)
Total Bilirubin: 5 mg/dL — ABNORMAL HIGH (ref 0.3–1.2)
Total Protein: 5.3 g/dL — ABNORMAL LOW (ref 6.5–8.1)

## 2018-07-08 LAB — MAGNESIUM: MAGNESIUM: 2 mg/dL (ref 1.7–2.4)

## 2018-07-08 NOTE — Progress Notes (Signed)
Pt informed he can order a meal. Pt will be NPO at midnight tonight.

## 2018-07-08 NOTE — Progress Notes (Signed)
Progress Note   Subjective  Patient has no pain, feeling well. He was made NPO today for possible ERCP although was not placed on our schedule. MRCP shows intra and extrahepatic biliary ductal dilation with CBD stones measuring up to 27mm. NO fevers. Of note he has not had Eliquis in 5-6 days.   Objective   Vital signs in last 24 hours: Temp:  [97.6 F (36.4 C)-98.4 F (36.9 C)] 98.4 F (36.9 C) (10/28 0448) Pulse Rate:  [84-107] 97 (10/28 0448) Resp:  [18-20] 18 (10/28 0448) BP: (126-132)/(77-84) 132/79 (10/28 0448) SpO2:  [94 %-96 %] 95 % (10/28 0448) Weight:  [102.3 kg] 102.3 kg (10/28 0448) Last BM Date: 07/07/18 General:    white male in NAD Heart:  Regular rate and rhythm; no murmurs Lungs: Respirations even and unlabored, lungs CTA bilaterally Abdomen:  Soft, nontender and nondistended.  Extremities:  Without edema. Neurologic:  Alert and oriented,  grossly normal neurologically. Psych:  Cooperative. Normal mood and affect.  Intake/Output from previous day: 10/27 0701 - 10/28 0700 In: 855.8 [P.O.:720; I.V.:35.8; IV Piggyback:100] Out: 2300 [Urine:2300] Intake/Output this shift: No intake/output data recorded.  Lab Results: Recent Labs    07/06/18 0601 07/07/18 0630 07/08/18 0522  WBC 5.7 6.6 6.9  HGB 9.3* 9.9* 10.4*  HCT 30.1* 30.9* 32.8*  PLT 109* 154 198   BMET Recent Labs    07/06/18 0601 07/07/18 0630 07/08/18 0522  NA 138 137 138  K 3.7 3.6 3.5  CL 109 109 110  CO2 21* 20* 22  GLUCOSE 102* 93 128*  BUN 24* 21 17  CREATININE 1.67* 1.62* 1.61*  CALCIUM 8.1* 8.0* 8.0*   LFT Recent Labs    07/08/18 0522  PROT 5.3*  ALBUMIN 2.0*  AST 42*  ALT 60*  ALKPHOS 203*  BILITOT 5.0*   PT/INR No results for input(s): LABPROT, INR in the last 72 hours.  Studies/Results: Nm Hepatobiliary Liver Func  Result Date: 07/06/2018 CLINICAL DATA:  Abdominal pain with nausea and vomiting on off since 06/28/2018. Rising bilirubin. Abnormal liver  function test. EXAM: NUCLEAR MEDICINE HEPATOBILIARY IMAGING TECHNIQUE: Sequential images of the abdomen were obtained out to 60 minutes following intravenous administration of radiopharmaceutical. RADIOPHARMACEUTICALS:  6.5 mCi Tc-85m  Choletec IV COMPARISON:  Ultrasound, 07/01/2018. FINDINGS: There is prompt and homogeneous accumulation of radiotracer by the liver. Despite imaging over the course of 120 minutes, no radiotracer activity was noted within the common bile duct, gallbladder or small bowel. IMPRESSION: 1. Nonvisualization of the common bile duct, gallbladder and small bowel. This is likely due to hepatocellular dysfunction with depressed biliary excretion. Central biliary obstruction is also possible. Electronically Signed   By: Lajean Manes M.D.   On: 07/06/2018 14:13   Mr Abdomen Mrcp Wo Contrast  Result Date: 07/07/2018 CLINICAL DATA:  Jaundice, abdominal pain, fever EXAM: MRI ABDOMEN WITHOUT CONTRAST  (INCLUDING MRCP) TECHNIQUE: Multiplanar multisequence MR imaging of the abdomen was performed. Heavily T2-weighted images of the biliary and pancreatic ducts were obtained, and three-dimensional MRCP images were rendered by post processing. COMPARISON:  Right upper quadrant ultrasound dated 07/01/2018 FINDINGS: Motion degraded images. Lower chest: Small bilateral pleural effusions. Hepatobiliary: No morphologic findings of cirrhosis. 2.9 cm cyst in segment 4B (series 4/image 13). Mild intrahepatic and extrahepatic ductal dilatation. Multiple common duct stones, measuring up to 12 mm in the central common duct (series 13/image 6). Distended, irregular gallbladder with numerous layering gallstones and mild gallbladder wall thickening. Given the associated findings and  irregular appearance of the gallbladder wall, this appearance is worrisome for acute cholecystitis. Pancreas:  Within normal limits. Spleen:  Within normal limits. Adrenals/Urinary Tract:  Adrenal glands are within normal limits.  Kidneys are within normal limits.  No hydronephrosis. Stomach/Bowel: Stomach is notable for a tiny hiatal hernia. Duodenal diverticulum (series 4/image 25) along the posterior aspect of the 2nd/3rd portion of the duodenum. Visualized bowel is otherwise grossly unremarkable. Vascular/Lymphatic: 7.6 x 9.6 cm infrarenal abdominal aortic aneurysm (series 4/image 33), incompletely visualized. Suspected indwelling aorto bi-iliac stent. No suspicious abdominal lymphadenopathy. Other:  No abdominal ascites. Musculoskeletal: No focal osseous lesions. IMPRESSION: Distended, irregular gallbladder with cholelithiasis and gallbladder wall thickening, favoring acute cholecystitis. Associated intrahepatic and extrahepatic dilatation with multiple common duct stones measuring up to 12 mm. 7.6 x 9.6 cm infrarenal abdominal aortic aneurysm, incompletely visualized. Suspected aorto bi-iliac stents. Electronically Signed   By: Julian Hy M.D.   On: 07/07/2018 15:22   Mr 3d Recon At Scanner  Result Date: 07/07/2018 CLINICAL DATA:  Jaundice, abdominal pain, fever EXAM: MRI ABDOMEN WITHOUT CONTRAST  (INCLUDING MRCP) TECHNIQUE: Multiplanar multisequence MR imaging of the abdomen was performed. Heavily T2-weighted images of the biliary and pancreatic ducts were obtained, and three-dimensional MRCP images were rendered by post processing. COMPARISON:  Right upper quadrant ultrasound dated 07/01/2018 FINDINGS: Motion degraded images. Lower chest: Small bilateral pleural effusions. Hepatobiliary: No morphologic findings of cirrhosis. 2.9 cm cyst in segment 4B (series 4/image 13). Mild intrahepatic and extrahepatic ductal dilatation. Multiple common duct stones, measuring up to 12 mm in the central common duct (series 13/image 6). Distended, irregular gallbladder with numerous layering gallstones and mild gallbladder wall thickening. Given the associated findings and irregular appearance of the gallbladder wall, this appearance is  worrisome for acute cholecystitis. Pancreas:  Within normal limits. Spleen:  Within normal limits. Adrenals/Urinary Tract:  Adrenal glands are within normal limits. Kidneys are within normal limits.  No hydronephrosis. Stomach/Bowel: Stomach is notable for a tiny hiatal hernia. Duodenal diverticulum (series 4/image 25) along the posterior aspect of the 2nd/3rd portion of the duodenum. Visualized bowel is otherwise grossly unremarkable. Vascular/Lymphatic: 7.6 x 9.6 cm infrarenal abdominal aortic aneurysm (series 4/image 33), incompletely visualized. Suspected indwelling aorto bi-iliac stent. No suspicious abdominal lymphadenopathy. Other:  No abdominal ascites. Musculoskeletal: No focal osseous lesions. IMPRESSION: Distended, irregular gallbladder with cholelithiasis and gallbladder wall thickening, favoring acute cholecystitis. Associated intrahepatic and extrahepatic dilatation with multiple common duct stones measuring up to 12 mm. 7.6 x 9.6 cm infrarenal abdominal aortic aneurysm, incompletely visualized. Suspected aorto bi-iliac stents. Electronically Signed   By: Julian Hy M.D.   On: 07/07/2018 15:22       Assessment / Plan:   84 y/o male with a history of AAA post stenting, AF on Eliquis, pacemaker, admitted after syncope, found to have mildly elevated troponin on admission which normalized. Summary thus far -  E. coli bacteremia, suspected UTI initially.  Nodular appearing liver on ultrasound along with gallstones. LAE elevation. Cardiology had planned cardiac catheterization this admission but decided to wait because of the bacteremia.  He also developed upper GI bleed secondary to [employer] tear. EUS done as well and revealed gallstones and sludge.  Biliary tree was hard to visualize given shadowing stones. HIDA scan inconclusive. MRCP yesterday showed multiple common bile duct stones, and biliary ductal dilation. Per drain not performed as he is currently pain free, WBC normal, LAEs  stable. Surgery has seen.   I have discussed his case with Dr. Ardis Hughs  of [facility]. ERCP is warranted to clear his biliary tree and remove the stones. I have discussed risks / benefits of ERCP with the patient and he wished to proceed. Apparently the patient was told it was happening today but there is no room in the endoscopy suite for ERCP today. His case will be done tomorrow AM at 0930 with Dr. Ardis Hughs. NPO after MN. Last dose of Eliquis was several days ago - please continue to hold.  Call with questions in the interim.  Harlan Cellar, MD [facility]

## 2018-07-08 NOTE — Progress Notes (Signed)
RN spoke with gastro PA regarding if pt is scheduled for procedure. PA states pt is not on the schedule at this time and she will round on pt. PA states to keep pt NPO. RN informed pt/family.

## 2018-07-08 NOTE — Progress Notes (Signed)
RN rounded on pt. Pt/family state they do not need anything at this time.

## 2018-07-08 NOTE — Progress Notes (Addendum)
Patient ID: Ernest Parker, male   DOB: 28-May-1934, 84 y.o.   MRN: [id number]    3 Days Post-Op  Subjective: Patient with no complaints.  He denies any abdominal pain at all since admission.  No nausea/vomiting.  Tolerating diet yesterday with no issues  Objective: Vital signs in last 24 hours: Temp:  [97.6 F (36.4 C)-98.4 F (36.9 C)] 98.4 F (36.9 C) (10/28 0448) Pulse Rate:  [84-107] 97 (10/28 0448) Resp:  [18-20] 18 (10/28 0448) BP: (126-132)/(77-84) 132/79 (10/28 0448) SpO2:  [94 %-96 %] 95 % (10/28 0448) Weight:  [102.3 kg] 102.3 kg (10/28 0448) Last BM Date: 07/07/18  Intake/Output from previous day: 10/27 0701 - 10/28 0700 In: 855.8 [P.O.:720; I.V.:35.8; IV Piggyback:100] Out: 2300 [Urine:2300] Intake/Output this shift: No intake/output data recorded.  PE: Heart: mild tachy Lungs: CTAB Abd: soft, completely NT, ND, +BS  Lab Results:  Recent Labs    07/07/18 0630 07/08/18 0522  WBC 6.6 6.9  HGB 9.9* 10.4*  HCT 30.9* 32.8*  PLT 154 198   BMET Recent Labs    07/07/18 0630 07/08/18 0522  NA 137 138  K 3.6 3.5  CL 109 110  CO2 20* 22  GLUCOSE 93 128*  BUN 21 17  CREATININE 1.62* 1.61*  CALCIUM 8.0* 8.0*   PT/INR No results for input(s): LABPROT, INR in the last 72 hours. CMP     Component Value Date/Time   NA 138 07/08/2018 0522   NA 141 12/13/2017 0751   K 3.5 07/08/2018 0522   CL 110 07/08/2018 0522   CO2 22 07/08/2018 0522   GLUCOSE 128 (H) 07/08/2018 0522   BUN 17 07/08/2018 0522   BUN 19 12/13/2017 0751   CREATININE 1.61 (H) 07/08/2018 0522   CALCIUM 8.0 (L) 07/08/2018 0522   PROT 5.3 (L) 07/08/2018 0522   ALBUMIN 2.0 (L) 07/08/2018 0522   AST 42 (H) 07/08/2018 0522   ALT 60 (H) 07/08/2018 0522   ALKPHOS 203 (H) 07/08/2018 0522   BILITOT 5.0 (H) 07/08/2018 0522   GFRNONAA 38 (L) 07/08/2018 0522   GFRAA 44 (L) 07/08/2018 0522   Lipase  No results found for: LIPASE     Studies/Results: Nm Hepatobiliary Liver Func  Result  Date: 07/06/2018 CLINICAL DATA:  Abdominal pain with nausea and vomiting on off since 06/28/2018. Rising bilirubin. Abnormal liver function test. EXAM: NUCLEAR MEDICINE HEPATOBILIARY IMAGING TECHNIQUE: Sequential images of the abdomen were obtained out to 60 minutes following intravenous administration of radiopharmaceutical. RADIOPHARMACEUTICALS:  6.5 mCi Tc-75m  Choletec IV COMPARISON:  Ultrasound, 07/01/2018. FINDINGS: There is prompt and homogeneous accumulation of radiotracer by the liver. Despite imaging over the course of 120 minutes, no radiotracer activity was noted within the common bile duct, gallbladder or small bowel. IMPRESSION: 1. Nonvisualization of the common bile duct, gallbladder and small bowel. This is likely due to hepatocellular dysfunction with depressed biliary excretion. Central biliary obstruction is also possible. Electronically Signed   By: Lajean Manes M.D.   On: 07/06/2018 14:13   Mr Abdomen Mrcp Wo Contrast  Result Date: 07/07/2018 CLINICAL DATA:  Jaundice, abdominal pain, fever EXAM: MRI ABDOMEN WITHOUT CONTRAST  (INCLUDING MRCP) TECHNIQUE: Multiplanar multisequence MR imaging of the abdomen was performed. Heavily T2-weighted images of the biliary and pancreatic ducts were obtained, and three-dimensional MRCP images were rendered by post processing. COMPARISON:  Right upper quadrant ultrasound dated 07/01/2018 FINDINGS: Motion degraded images. Lower chest: Small bilateral pleural effusions. Hepatobiliary: No morphologic findings of cirrhosis. 2.9 cm cyst in  segment 4B (series 4/image 13). Mild intrahepatic and extrahepatic ductal dilatation. Multiple common duct stones, measuring up to 12 mm in the central common duct (series 13/image 6). Distended, irregular gallbladder with numerous layering gallstones and mild gallbladder wall thickening. Given the associated findings and irregular appearance of the gallbladder wall, this appearance is worrisome for acute cholecystitis.  Pancreas:  Within normal limits. Spleen:  Within normal limits. Adrenals/Urinary Tract:  Adrenal glands are within normal limits. Kidneys are within normal limits.  No hydronephrosis. Stomach/Bowel: Stomach is notable for a tiny hiatal hernia. Duodenal diverticulum (series 4/image 25) along the posterior aspect of the 2nd/3rd portion of the duodenum. Visualized bowel is otherwise grossly unremarkable. Vascular/Lymphatic: 7.6 x 9.6 cm infrarenal abdominal aortic aneurysm (series 4/image 33), incompletely visualized. Suspected indwelling aorto bi-iliac stent. No suspicious abdominal lymphadenopathy. Other:  No abdominal ascites. Musculoskeletal: No focal osseous lesions. IMPRESSION: Distended, irregular gallbladder with cholelithiasis and gallbladder wall thickening, favoring acute cholecystitis. Associated intrahepatic and extrahepatic dilatation with multiple common duct stones measuring up to 12 mm. 7.6 x 9.6 cm infrarenal abdominal aortic aneurysm, incompletely visualized. Suspected aorto bi-iliac stents. Electronically Signed   By: Julian Hy M.D.   On: 07/07/2018 15:22   Mr 3d Recon At Scanner  Result Date: 07/07/2018 CLINICAL DATA:  Jaundice, abdominal pain, fever EXAM: MRI ABDOMEN WITHOUT CONTRAST  (INCLUDING MRCP) TECHNIQUE: Multiplanar multisequence MR imaging of the abdomen was performed. Heavily T2-weighted images of the biliary and pancreatic ducts were obtained, and three-dimensional MRCP images were rendered by post processing. COMPARISON:  Right upper quadrant ultrasound dated 07/01/2018 FINDINGS: Motion degraded images. Lower chest: Small bilateral pleural effusions. Hepatobiliary: No morphologic findings of cirrhosis. 2.9 cm cyst in segment 4B (series 4/image 13). Mild intrahepatic and extrahepatic ductal dilatation. Multiple common duct stones, measuring up to 12 mm in the central common duct (series 13/image 6). Distended, irregular gallbladder with numerous layering gallstones and mild  gallbladder wall thickening. Given the associated findings and irregular appearance of the gallbladder wall, this appearance is worrisome for acute cholecystitis. Pancreas:  Within normal limits. Spleen:  Within normal limits. Adrenals/Urinary Tract:  Adrenal glands are within normal limits. Kidneys are within normal limits.  No hydronephrosis. Stomach/Bowel: Stomach is notable for a tiny hiatal hernia. Duodenal diverticulum (series 4/image 25) along the posterior aspect of the 2nd/3rd portion of the duodenum. Visualized bowel is otherwise grossly unremarkable. Vascular/Lymphatic: 7.6 x 9.6 cm infrarenal abdominal aortic aneurysm (series 4/image 33), incompletely visualized. Suspected indwelling aorto bi-iliac stent. No suspicious abdominal lymphadenopathy. Other:  No abdominal ascites. Musculoskeletal: No focal osseous lesions. IMPRESSION: Distended, irregular gallbladder with cholelithiasis and gallbladder wall thickening, favoring acute cholecystitis. Associated intrahepatic and extrahepatic dilatation with multiple common duct stones measuring up to 12 mm. 7.6 x 9.6 cm infrarenal abdominal aortic aneurysm, incompletely visualized. Suspected aorto bi-iliac stents. Electronically Signed   By: Julian Hy M.D.   On: 07/07/2018 15:22    Anti-infectives: Anti-infectives (From admission, onward)   Start     Dose/Rate Route Frequency Ordered Stop   07/05/18 1200  levofloxacin (LEVAQUIN) IVPB 500 mg     500 mg 100 mL/hr over 60 Minutes Intravenous Every 24 hours 07/05/18 1049     07/04/18 1800  Levofloxacin (LEVAQUIN) IVPB 250 mg  Status:  Discontinued     250 mg 50 mL/hr over 60 Minutes Intravenous Every 24 hours 07/03/18 1704 07/04/18 0940   07/04/18 0945  aztreonam (AZACTAM) 1 g in sodium chloride 0.9 % 100 mL IVPB  Status:  Discontinued  1 g 200 mL/hr over 30 Minutes Intravenous Every 8 hours 07/04/18 0940 07/05/18 1049   07/03/18 1645  levofloxacin (LEVAQUIN) IVPB 500 mg     500 mg 100  mL/hr over 60 Minutes Intravenous  Once 07/03/18 1633 07/03/18 2035       Assessment/Plan Choledocholithiasis/cholecystitis  -MRCP revealed multiple CBD stones.  Defer to GI on timing of ERCP.  Suspect this is source of elevated LFTs. -MRCP also revealed some wall thickening of his gallbladder, concerning for possible cholecystitis.  He did have a HIDA which was inconclusive for cholecystitis as his CBD was blocked.  Patient currently has a normal WBC, afebrile with no pain.  Because of this we will hold off on a perc chole drain for right now.  We will allow his duct to get cleared and allow him to eat and see how he does.  If he continues to not have pain with eating and exhibits no other signs of cholecystitis, then he can hopefully avoid a perc chole drain and just be treated with abx therapy for now and follow up as an outpatient.  Bacteremia, E. Coli -on abx therapy UTI -CXs reveal Klebsiella pneumo and Enterococcus faecalis -treatment per primary team UGI bleed, Mallory-Weiss tear, esophagitis, duodenitis -on PPI therapy, per GI A. Fib -on cardizem and eliquis CAD -needs ischemia work up as outpatient per cardiology notes  FEN - NPO VTE - SCDs/ eliquis on hold ID - Levaquin   LOS: 6 days    Henreitta Cea , [facility] Surgery 07/08/2018, 7:44 AM Pager: [phone]

## 2018-07-08 NOTE — Plan of Care (Signed)
  Problem: Education: Goal: Knowledge of General Education information will improve Description Including pain rating scale, medication(s)/side effects and non-pharmacologic comfort measures Outcome: Progressing   Problem: Health Behavior/Discharge Planning: Goal: Ability to manage health-related needs will improve Outcome: Progressing   Problem: Clinical Measurements: Goal: Ability to maintain clinical measurements within normal limits will improve Outcome: Progressing   Problem: Activity: Goal: Risk for activity intolerance will decrease Outcome: Progressing   Problem: Coping: Goal: Level of anxiety will decrease Outcome: Progressing   Problem: Safety: Goal: Ability to remain free from injury will improve Outcome: Progressing   Problem: Skin Integrity: Goal: Risk for impaired skin integrity will decrease Outcome: Progressing   Problem: Education: Goal: Understanding of CV disease, CV risk reduction, and recovery process will improve Outcome: Progressing

## 2018-07-08 NOTE — Plan of Care (Signed)
  Problem: Education: Goal: Knowledge of General Education information will improve Description Including pain rating scale, medication(s)/side effects and non-pharmacologic comfort measures 07/08/2018 1437 by Ottie Glazier, RN Outcome: Progressing 07/08/2018 1428 by Ottie Glazier, RN Outcome: Progressing   Problem: Health Behavior/Discharge Planning: Goal: Ability to manage health-related needs will improve 07/08/2018 1437 by Ottie Glazier, RN Outcome: Progressing 07/08/2018 1428 by Ottie Glazier, RN Outcome: Progressing   Problem: Clinical Measurements: Goal: Ability to maintain clinical measurements within normal limits will improve 07/08/2018 1437 by Ottie Glazier, RN Outcome: Progressing 07/08/2018 1428 by Ottie Glazier, RN Outcome: Progressing   Problem: Activity: Goal: Risk for activity intolerance will decrease 07/08/2018 1437 by Ottie Glazier, RN Outcome: Progressing 07/08/2018 1428 by Ottie Glazier, RN Outcome: Progressing   Problem: Nutrition: Goal: Adequate nutrition will be maintained Outcome: Progressing   Problem: Coping: Goal: Level of anxiety will decrease Outcome: Progressing   Problem: Elimination: Goal: Will not experience complications related to bowel motility Outcome: Progressing   Problem: Safety: Goal: Ability to remain free from injury will improve 07/08/2018 1437 by Ottie Glazier, RN Outcome: Progressing 07/08/2018 1428 by Ottie Glazier, RN Outcome: Progressing   Problem: Skin Integrity: Goal: Risk for impaired skin integrity will decrease 07/08/2018 1437 by Ottie Glazier, RN Outcome: Progressing 07/08/2018 1428 by Ottie Glazier, RN Outcome: Progressing   Problem: Education: Goal: Understanding of CV disease, CV risk reduction, and recovery process will improve 07/08/2018 1437 by Ottie Glazier, RN Outcome: Progressing 07/08/2018 1428 by Ottie Glazier, RN Outcome:  Progressing Goal: Individualized Educational Video(s) Outcome: Progressing   Problem: Activity: Goal: Ability to return to baseline activity level will improve Outcome: Progressing   Problem: Cardiovascular: Goal: Ability to achieve and maintain adequate cardiovascular perfusion will improve Outcome: Progressing   Problem: Health Behavior/Discharge Planning: Goal: Ability to safely manage health-related needs after discharge will improve Outcome: Progressing

## 2018-07-08 NOTE — Care Management Important Message (Signed)
Important Message  Patient Details  Name: Ernest Parker MRN: [id number] Date of Birth: [birth year]   Medicare Important Message Given:  Yes    Montine Hight P Armany Mano 07/08/2018, 2:26 PM

## 2018-07-08 NOTE — Progress Notes (Signed)
Patient ID: Ernest Parker, male   DOB: 1934/07/08, 84 y.o.   MRN: [id number]  PROGRESS NOTE    Ernest Parker  FTD:[id number] DOB: 1934-03-13 DOA: 07/01/2018 PCP: Seward Carol, MD   Brief Narrative:  84 year old male with history of A. fib on Eliquis, status post pacemaker in 09/2017, remote prostate cancer, hypertension, hyperlipidemia presented with  unresponsiveness.  CT of the head was negative for bleed.  Troponin was mildly elevated.  Cardiology was consulted.  He was started on IV fluids for AKI.  LFTs were mildly elevated.  GI was consulted. He was planned for cardiac catheterization on 07/04/2018. Patient had chills on 07/03/2018 and went into A. fib with RVR.  He was started on empiric antibiotics and had to be placed on Cardizem drip. He also had upper GI bleeding.  He had EGD with EUS on 07/05/2018.   Assessment & Plan:   Principal Problem:   Syncope and collapse Active Problems:   Atrial fibrillation with rapid ventricular response (HCC)   Essential hypertension   History of prostate cancer   Elevated LFTs   CKD (chronic kidney disease) stage 3, GFR 30-59 ml/min (HCC)   PAF (paroxysmal atrial fibrillation) (HCC)   Elevated troponin   Mallory-Weiss syndrome   Cirrhosis of liver without ascites ([facility])   E coli bacteremia  Acute calculus cholecystitis with choledocholithiasis causing elevated LFTs -MRCP done on 07/07/2018 was suggestive of cholecystitis with choledocholithiasis -General surgery has been consulted.  General surgery recommends holding off on percutaneous cholecystostomy drain for now -Continue antibiotic treatment for now -We will follow-up with GI regarding plans for ERCP  E. coli bacteremia -Probably from biliary source -Continue Levaquin.  Blood culture from 07/06/2018 was not positive for E. coli but had methicillin-resistant coagulase negative staphylococcal's most likely contaminant  UTI -Probably associated with chronic indwelling Foley  catheter. -Probably evolving on admission.  Urine culture done at the time of admission from the indwelling Foley catheter was positive for Klebsiella pneumoniae and Enterococcus faecalis.  Foley catheter was changed on 07/03/2018.  Repeat urine cultures from 07/03/2018 is growing the same and organisms. -Antibiotic plan as above  Syncope with elevated troponins -Unclear etiology.  Had prolonged confusion for 20 to 30 minutes -Might be related to orthostatic hypotension: Lasix on hold.  Off IV fluids -Echo showed EF of 55 to 60% -Cardiology had planned for cardiac catheterization but this has been postponed because of bacteremia.  Outpatient follow-up with cardiology  Paroxysmal atrial fibrillation/flutter with rapid ventricular rate; bradycardia status post pacemaker in 1/19 -had to be put on Cardizem drip overnight of 07/03/2018.  Currently rate controlled.  On oral Cardizem and beta-blocker.  Outpatient follow-up with cardiology.  Heparin drip discontinued because of GI bleed.  Will wait for further GI recommendations before starting Eliquis or heparin.  Probable upper GI bleeding -GI following.  Continue Protonix.  Status post EGD with EUS on 07/05/2018 which showed esophageal ulcers, Barrett's esophagus and healing Mallory-Weiss tear.   Acute kidney injury on chronic kidney disease stage III -Improved.  Creatinine today is 1.61;  baseline creatinine is around 1.7 -Lasix on hold.  Repeat a.m. creatinine.  Off IV fluids.  Monitor strict input and output  Chronic urinary retention with chronic indwelling Foley's catheter -Outpatient follow-up with urology.  Urology had recommended to replace Foley's catheter while inpatient.  Foley catheter was changed on 06/30/2018.  Thrombocytopenia -Questionable cause.  Resolved  DVT prophylaxis: Heparin discontinued because of probable upper GI bleeding Code Status: DNR Family Communication:  Daughter and son at bedside Disposition Plan: Depends on  clinical outcome  Consultants: Cardiology/GI/general surgery  Procedures:  Echo Study Conclusions  - Left ventricle: The cavity size was normal. There was moderate   concentric hypertrophy. Systolic function was normal. The   estimated ejection fraction was in the range of 55% to 60%. Wall   motion was normal; there were no regional wall motion   abnormalities. - Aortic valve: There was trivial regurgitation. - Mitral valve: Mildly calcified annulus. There was mild   regurgitation. - Left atrium: The atrium was moderately dilated.  EGD and EUS on 07/05/2018 Findings:      ENDOSCOPIC FINDING: :      No gross lesions were noted in the proximal esophagus.      Three linear esophageal ulcers with no bleeding and stigmata of recent       bleeding were found in the mid esophagus. Biopsies were taken with a       cold forceps for histology.      Two tongues of salmon-colored mucosa were present from 30 to 36 cm. The       maximum longitudinal extent of these esophageal mucosal changes was 6 cm       in length. Biopsies were taken with a cold forceps for histology.      A medium non-bleeding Mallory-Weiss tear was found but looks to be       healing right at the Z-line junction into the hiatal hernia.      The Z-line was irregular and was found 36 cm from the incisors.      A medium-sized hiatal hernia was found. The proximal extent of the       gastric folds (end of tubular esophagus) was 37 cm from the incisors.       The hiatal narrowing was 42 cm from the incisors.      No gross lesions were noted in the entire examined stomach otherwise.       Biopsies were taken with a cold forceps for histology and Helicobacter       pylori testing.      Multiple diffuse, small erosions were found in the duodenal bulb.      A medium diverticulum where the ampullary diverticulum was noted.      The major papilla was normal but completely intradiverticular.      No gross lesions were noted in  the third portion of the duodenum.      ENDOSONOGRAPHIC FINDING: :      Extensive hyperechoic material consistent with sludge was visualized       endosonographically in the gallbladder. However, this obscures the       common hepatic duct region due to the shadowing defects that are noted.      There was dilation in the middle third of the main bile duct. There is       obscuring as a result of the gallbladder itself of the upper third of       the main bile duct. The main CBD in the head of the pancreas before the       cystic duct insertion is 2.6 mm -> 3.4 mm.      Moderate hyperechoic material consistent with sludge was visualized       endosonographically in the presumed middle third of the main bile duct.       This was adjacent to the sludge/debris that was within the gallbladder  region as well. There is subsequent dilation at that region.      Pancreatic parenchymal abnormalities were noted in the genu of the       pancreas, pancreatic body and pancreatic tail. These consisted of       atrophy.      The pancreatic duct had a normal endosonographic appearance in the       pancreatic head (PD =1.1 mm -> 1.7 mm), genu of the pancreas (1.2 mm),       body of the pancreas (0.8 mm), and the tail of the pancreas (0.5 mm).      Endosonographic imaging of the ampulla showed no intramural       (subepithelial) lesion or mass.      A cyst was found in the left lobe of the liver and measured 28 mm by 26       mm in maximal cross-sectional diameter. The cyst was anechoic. It was       thinly septated.      Endosonographic imaging in the rest of the visualized portion of the       liver showed no lesion.      A small amount of fluid - ascites was visualized as an anechoic feature,       was found in the peritoneal cavity.      The celiac region was visualized. Impression:               EGD Impression:                           - No gross lesions in proximal esophagus.                            - Non-bleeding esophageal ulcers in middle                            esophagus. Biopsied.                           - Salmon-colored mucosa suspicious for long-segment                            Barrett's esophagus and classified as Barrett's                            stage C0-M6 per [place] criteria. Biopsied to                            confirm diagnosis.                           - Mallory-Weiss tear that is healing at the distal                            GE [place]unction..                           - Z-line irregular, 36 cm from the incisors.                           -  Medium-sized hiatal hernia.                           - No other gross lesions in the stomach. Biopsied.                           - Erosive duodenopathy.                           - Non-bleeding duodenal diverticulum.                           - Normal major papilla but is located entirely                            intradiverticular.                           - No gross lesions in the third portion of the                            duodenum.                           EUS Impression:                           - Hyperechoic material consistent with sludge and                            debris was visualized endosonographically in the                            gallbladder. There is significant obscuring of the                            common hepatic duct region as a result of this.                           - There was dilation in the middle third of the                            main bile duct and there was evidence/concern for                            hyperechoic material consistent with sludge was                            visualized endosonographically in this region,                            however, the overt cystic duct insertion and then                            the development of the gallbladder obscuring  shadowing defects makes things difficult to see,                             but sludge is noted.                           - Pancreatic parenchymal abnormalities consisting                            of atrophy were noted in the genu of the pancreas,                            pancreatic body and pancreatic tail. But the duct                            itself, had a normal endosonographic appearance in                            the pancreatic head, genu of the pancreas, body of                            the pancreas and tail of the pancreas.                           - A cyst was found in the left lobe of the liver                            and measured 28 mm by 26 mm.                           - Ascites was found on endosonographic examination                            of the peritoneal cavity. Recommendation:           - The patient will be observed post-procedure,                            until all discharge criteria are met.                           - Return patient to hospital ward for ongoing care.                           - Observe patient's clinical course.                           - Regular Diet OK for today.                           - Will plan to proceed with HIDA to evaluate for                            evidence of cholecystitis and also to  evaluate the                            biliary tree in regards to positioning of                            radiotracer into the duodenum for evaluation of                            partial obstruction or not.                           - Based on the patient's kidney function, and as he                            has not had any cross-sectional imaging in months,                            he needs this to be performed. Ideally would get a                            CT-Abdmen with IV contrast vs a MRI-Abdomen with                            contrast. If kidney function is stable and no plan                            for urgent catheterization then would have medical                             team order scan on Saturday.                           - Reasonable on Saturday to discuss case with                            surgery as HIDA is being completed and his                            cross-sectional CT is performed.                           - Based on his liver function testing and the                            findings above will consider the role of                            cholecystectomy, cholecystostomy tube placement,                            and role of ERCP moving forward.                           -  The findings and recommendations were discussed                            with the patient.                           - The findings and recommendations were discussed                            with the patient's family.                           - The findings and recommendations were discussed                            with the referring physician.                           - GI MD team covering over the weekend.  Antimicrobials: Levaquin   Subjective: Patient seen and examined at bedside.  Patient denies any worsening abdominal pain, nausea, vomiting, diarrhea or shortness of breath. objective: Vitals:   07/07/18 1243 07/07/18 2001 07/08/18 0448 07/08/18 0936  BP: 126/77 129/84 132/79 130/82  Pulse: (!) 107 84 97   Resp: '18 20 18   ' Temp: 97.6 F (36.4 C) 98.2 F (36.8 C) 98.4 F (36.9 C)   TempSrc: Oral Oral Oral   SpO2: 94% 96% 95%   Weight:   102.3 kg   Height:        Intake/Output Summary (Last 24 hours) at 07/08/2018 0948 Last data filed at 07/08/2018 0934 Gross per 24 hour  Intake 588.72 ml  Output 2300 ml  Net -1711.28 ml   Filed Weights   07/06/18 0353 07/07/18 0538 07/08/18 0448  Weight: 103 kg 103.1 kg 102.3 kg    Examination:  General exam: No acute distress.  Calm and comfortable Respiratory system: Bilateral decreased breath sounds at bases, with scattered crackles   cardiovascular system: Rate controlled, S1-S2  heard gastrointestinal system: Abdomen is nondistended, soft and nontender. Normal bowel sounds heard. Extremities: No cyanosis.  No edema  Data Reviewed: I have personally reviewed following labs and imaging studies  CBC: Recent Labs  Lab 07/04/18 0505 07/04/18 1632 07/05/18 0627 07/06/18 0601 07/07/18 0630 07/08/18 0522  WBC 11.3*  --  7.3 5.7 6.6 6.9  NEUTROABS 10.0*  --  5.7 4.5 5.1 5.2  HGB 9.6* 8.6* 9.3* 9.3* 9.9* 10.4*  HCT 30.8* 28.0* 30.7* 30.1* 30.9* 32.8*  MCV 91.9  --  92.7 91.2 90.1 89.1  PLT 97*  --  98* 109* 154 233   Basic Metabolic Panel: Recent Labs  Lab 07/04/18 0505 07/05/18 0627 07/06/18 0601 07/07/18 0630 07/08/18 0522  NA 136 140 138 137 138  K 4.1 3.9 3.7 3.6 3.5  CL 104 108 109 109 110  CO2 22 23 21* 20* 22  GLUCOSE 172* 106* 102* 93 128*  BUN 33* 31* 24* 21 17  CREATININE 2.14* 1.87* 1.67* 1.62* 1.61*  CALCIUM 8.0* 8.4* 8.1* 8.0* 8.0*  MG 1.9 2.2 2.1 1.9 2.0   GFR: Estimated Creatinine Clearance: 42.4 mL/min (A) (by C-G formula based on SCr of 1.61 mg/dL (H)). Liver Function Tests: Recent Labs  Lab 07/04/18 0505 07/05/18 4356 07/06/18 0601  07/07/18 0630 07/08/18 0522  AST 43* 38 45* 44* 42*  ALT 123* 95* 79* 66* 60*  ALKPHOS 153* 152* 193* 196* 203*  BILITOT 3.2* 3.7* 4.2* 5.8* 5.0*  PROT 5.5* 5.5* 5.2* 5.2* 5.3*  ALBUMIN 2.2* 2.2* 2.0* 2.0* 2.0*   No results for input(s): LIPASE, AMYLASE in the last 168 hours. Recent Labs  Lab 07/01/18 1445 07/01/18 2059  AMMONIA 59* 41*   Coagulation Profile: Recent Labs  Lab 07/01/18 1647 07/02/18 0050 07/03/18 0548  INR 1.13 1.43 1.11   Cardiac Enzymes: Recent Labs  Lab 07/01/18 1445 07/01/18 2059 07/02/18 0050 07/02/18 0748 07/02/18 1435 07/02/18 1903  CKTOTAL  --  196  --   --   --   --   TROPONINI 0.89*  --  0.93* 0.52* 0.37* 0.30*   BNP (last 3 results) No results for input(s): PROBNP in the last 8760 hours. HbA1C: No results for input(s): HGBA1C in the last 72  hours. CBG: Recent Labs  Lab 07/07/18 0809 07/07/18 1242 07/07/18 1712 07/07/18 2118 07/08/18 0742  GLUCAP 100* 150* 126* 96 107*   Lipid Profile: No results for input(s): CHOL, HDL, LDLCALC, TRIG, CHOLHDL, LDLDIRECT in the last 72 hours. Thyroid Function Tests: No results for input(s): TSH, T4TOTAL, FREET4, T3FREE, THYROIDAB in the last 72 hours. Anemia Panel: No results for input(s): VITAMINB12, FOLATE, FERRITIN, TIBC, IRON, RETICCTPCT in the last 72 hours. Sepsis Labs: No results for input(s): PROCALCITON, LATICACIDVEN in the last 168 hours.  Recent Results (from the past 240 hour(s))  Culture, Urine     Status: Abnormal   Collection Time: 07/02/18  7:37 AM  Result Value Ref Range Status   Specimen Description URINE, RANDOM  Final   Special Requests   Final    NONE Performed at [facility], [address] [place], [place] [address]    Culture (A)  Final    >=100,000 COLONIES/mL KLEBSIELLA PNEUMONIAE 40,000 COLONIES/mL ENTEROCOCCUS FAECALIS    Report Status 07/05/2018 FINAL  Final   Organism ID, Bacteria KLEBSIELLA PNEUMONIAE (A)  Final   Organism ID, Bacteria ENTEROCOCCUS FAECALIS (A)  Final      Susceptibility   Enterococcus faecalis - MIC*    AMPICILLIN <=2 SENSITIVE Sensitive     LEVOFLOXACIN 0.5 SENSITIVE Sensitive     NITROFURANTOIN <=16 SENSITIVE Sensitive     VANCOMYCIN <=0.5 SENSITIVE Sensitive     * 40,000 COLONIES/mL ENTEROCOCCUS FAECALIS   Klebsiella pneumoniae - MIC*    AMPICILLIN >=32 RESISTANT Resistant     CEFAZOLIN <=4 SENSITIVE Sensitive     CEFTRIAXONE <=1 SENSITIVE Sensitive     CIPROFLOXACIN <=0.25 SENSITIVE Sensitive     GENTAMICIN <=1 SENSITIVE Sensitive     IMIPENEM <=0.25 SENSITIVE Sensitive     NITROFURANTOIN 128 RESISTANT Resistant     TRIMETH/SULFA <=20 SENSITIVE Sensitive     AMPICILLIN/SULBACTAM 16 INTERMEDIATE Intermediate     PIP/TAZO 16 SENSITIVE Sensitive     Extended ESBL NEGATIVE Sensitive     * >=100,000 COLONIES/mL  KLEBSIELLA PNEUMONIAE  Culture, blood (routine x 2)     Status: None   Collection Time: 07/02/18 10:45 PM  Result Value Ref Range Status   Specimen Description BLOOD LEFT ANTECUBITAL  Final   Special Requests   Final    BOTTLES DRAWN AEROBIC ONLY Blood Culture results may not be optimal due to an inadequate volume of blood received in culture bottles   Culture   Final    NO GROWTH 5 DAYS Performed at  [facility], [place] [address], [place], [place] [address]    Report Status 07/07/2018 FINAL  Final  Culture, blood (routine x 2)     Status: None   Collection Time: 07/02/18 10:51 PM  Result Value Ref Range Status   Specimen Description BLOOD LEFT FOREARM  Final   Special Requests   Final    BOTTLES DRAWN AEROBIC AND ANAEROBIC Blood Culture results may not be optimal due to an inadequate volume of blood received in culture bottles   Culture   Final    NO GROWTH 5 DAYS Performed at [facility], [place] [address] [place], [place] [address]    Report Status 07/07/2018 FINAL  Final  Culture, blood (routine x 2)     Status: Abnormal   Collection Time: 07/03/18  4:15 PM  Result Value Ref Range Status   Specimen Description BLOOD RIGHT ANTECUBITAL  Final   Special Requests   Final    BOTTLES DRAWN AEROBIC AND ANAEROBIC Blood Culture adequate volume   Culture  Setup Time   Final    GRAM NEGATIVE RODS IN BOTH AEROBIC AND ANAEROBIC BOTTLES CRITICAL RESULT CALLED TO, READ BACK BY AND VERIFIED WITH: Zoila Shutter [id number] MLM Performed at [facility], [place] [address], [place], [place] [address]    Culture ESCHERICHIA COLI (A)  Final   Report Status 07/06/2018 FINAL  Final   Organism ID, Bacteria ESCHERICHIA COLI  Final      Susceptibility   Escherichia coli - MIC*    AMPICILLIN 4 SENSITIVE Sensitive     CEFAZOLIN <=4 SENSITIVE Sensitive     CEFEPIME <=1 SENSITIVE Sensitive     CEFTAZIDIME <=1 SENSITIVE Sensitive     CEFTRIAXONE <=1 SENSITIVE Sensitive     CIPROFLOXACIN  <=0.25 SENSITIVE Sensitive     GENTAMICIN <=1 SENSITIVE Sensitive     IMIPENEM <=0.25 SENSITIVE Sensitive     TRIMETH/SULFA <=20 SENSITIVE Sensitive     AMPICILLIN/SULBACTAM <=2 SENSITIVE Sensitive     PIP/TAZO <=4 SENSITIVE Sensitive     Extended ESBL NEGATIVE Sensitive     * ESCHERICHIA COLI  Blood Culture ID Panel (Reflexed)     Status: Abnormal   Collection Time: 07/03/18  4:15 PM  Result Value Ref Range Status   Enterococcus species NOT DETECTED NOT DETECTED Final   Listeria monocytogenes NOT DETECTED NOT DETECTED Final   Staphylococcus species NOT DETECTED NOT DETECTED Final   Staphylococcus aureus (BCID) NOT DETECTED NOT DETECTED Final   Streptococcus species NOT DETECTED NOT DETECTED Final   Streptococcus agalactiae NOT DETECTED NOT DETECTED Final   Streptococcus pneumoniae NOT DETECTED NOT DETECTED Final   Streptococcus pyogenes NOT DETECTED NOT DETECTED Final   Acinetobacter baumannii NOT DETECTED NOT DETECTED Final   Enterobacteriaceae species DETECTED (A) NOT DETECTED Final    Comment: Enterobacteriaceae represent a large family of gram-negative bacteria, not a single organism. CRITICAL RESULT CALLED TO, READ BACK BY AND VERIFIED WITH: PHARMD V BRYK [id number] MLM    Enterobacter cloacae complex NOT DETECTED NOT DETECTED Final   Escherichia coli DETECTED (A) NOT DETECTED Final    Comment: CRITICAL RESULT CALLED TO, READ BACK BY AND VERIFIED WITH: PHARMD V BRYK [id number] MLM    Klebsiella oxytoca NOT DETECTED NOT DETECTED Final   Klebsiella pneumoniae NOT DETECTED NOT DETECTED Final   Proteus species NOT DETECTED NOT DETECTED Final   Serratia marcescens NOT DETECTED NOT DETECTED Final   Carbapenem resistance NOT DETECTED NOT  DETECTED Final   Haemophilus influenzae NOT DETECTED NOT DETECTED Final   Neisseria meningitidis NOT DETECTED NOT DETECTED Final   Pseudomonas aeruginosa NOT DETECTED NOT DETECTED Final   Candida albicans NOT DETECTED NOT DETECTED Final    Candida glabrata NOT DETECTED NOT DETECTED Final   Candida krusei NOT DETECTED NOT DETECTED Final   Candida parapsilosis NOT DETECTED NOT DETECTED Final   Candida tropicalis NOT DETECTED NOT DETECTED Final    Comment: Performed at [facility], [place] [address], [place], [place] [address]  Culture, blood (routine x 2)     Status: Abnormal   Collection Time: 07/03/18  4:30 PM  Result Value Ref Range Status   Specimen Description BLOOD LEFT HAND  Final   Special Requests   Final    BOTTLES DRAWN AEROBIC AND ANAEROBIC Blood Culture adequate volume   Culture  Setup Time   Final    GRAM NEGATIVE RODS IN BOTH AEROBIC AND ANAEROBIC BOTTLES CRITICAL VALUE NOTED.  VALUE IS CONSISTENT WITH PREVIOUSLY REPORTED AND CALLED VALUE.    Culture (A)  Final    ESCHERICHIA COLI SUSCEPTIBILITIES PERFORMED ON PREVIOUS CULTURE WITHIN THE LAST 5 DAYS. Performed at [facility], [place] [address] [place], [place] [address]    Report Status 07/06/2018 FINAL  Final  Culture, Urine     Status: Abnormal   Collection Time: 07/03/18  5:08 PM  Result Value Ref Range Status   Specimen Description URINE, CATHETERIZED  Final   Special Requests   Final    NONE Performed at [facility], [place] [address] [place], [place] [address]    Culture (A)  Final    >=100,000 COLONIES/mL KLEBSIELLA PNEUMONIAE >=100,000 COLONIES/mL ENTEROCOCCUS FAECALIS    Report Status 07/07/2018 FINAL  Final   Organism ID, Bacteria KLEBSIELLA PNEUMONIAE (A)  Final   Organism ID, Bacteria ENTEROCOCCUS FAECALIS (A)  Final      Susceptibility   Enterococcus faecalis - MIC*    AMPICILLIN <=2 SENSITIVE Sensitive     LEVOFLOXACIN 1 SENSITIVE Sensitive     NITROFURANTOIN <=16 SENSITIVE Sensitive     VANCOMYCIN 1 SENSITIVE Sensitive     * >=100,000 COLONIES/mL ENTEROCOCCUS FAECALIS   Klebsiella pneumoniae - MIC*    AMPICILLIN >=32 RESISTANT Resistant     CEFAZOLIN <=4 SENSITIVE Sensitive     CEFTRIAXONE <=1 SENSITIVE Sensitive      CIPROFLOXACIN <=0.25 SENSITIVE Sensitive     GENTAMICIN <=1 SENSITIVE Sensitive     IMIPENEM <=0.25 SENSITIVE Sensitive     NITROFURANTOIN 64 INTERMEDIATE Intermediate     TRIMETH/SULFA <=20 SENSITIVE Sensitive     AMPICILLIN/SULBACTAM 16 INTERMEDIATE Intermediate     PIP/TAZO 16 SENSITIVE Sensitive     Extended ESBL NEGATIVE Sensitive     * >=100,000 COLONIES/mL KLEBSIELLA PNEUMONIAE  Culture, blood (routine x 2)     Status: None (Preliminary result)   Collection Time: 07/06/18  8:41 AM  Result Value Ref Range Status   Specimen Description BLOOD RIGHT ARM  Final   Special Requests   Final    BOTTLES DRAWN AEROBIC AND ANAEROBIC Blood Culture adequate volume   Culture  Setup Time   Final    GRAM POSITIVE COCCI IN BOTH AEROBIC AND ANAEROBIC BOTTLES CRITICAL RESULT CALLED TO, READ BACK BY AND VERIFIED WITH: Dory Larsen [phone] MLM Performed at [facility], [address] [place], [place] [address]    Culture [facility] POSITIVE COCCI  Final   Report Status  PENDING  Incomplete  Blood Culture ID Panel (Reflexed)     Status: Abnormal   Collection Time: 07/06/18  8:41 AM  Result Value Ref Range Status   Enterococcus species NOT DETECTED NOT DETECTED Final   Listeria monocytogenes NOT DETECTED NOT DETECTED Final   Staphylococcus species DETECTED (A) NOT DETECTED Final    Comment: Methicillin (oxacillin) resistant coagulase negative staphylococcus. Possible blood culture contaminant (unless isolated from more than one blood culture draw or clinical case suggests pathogenicity). No antibiotic treatment is indicated for blood  culture contaminants. CRITICAL RESULT CALLED TO, READ BACK BY AND VERIFIED WITH: PHARMD B MANCHERIL [phone] MLM    Staphylococcus aureus (BCID) NOT DETECTED NOT DETECTED Final   Methicillin resistance DETECTED (A) NOT DETECTED Final    Comment: CRITICAL RESULT CALLED TO, READ BACK BY AND VERIFIED WITH: PHARMD B MANCHERIL [phone] MLM     Streptococcus species NOT DETECTED NOT DETECTED Final   Streptococcus agalactiae NOT DETECTED NOT DETECTED Final   Streptococcus pneumoniae NOT DETECTED NOT DETECTED Final   Streptococcus pyogenes NOT DETECTED NOT DETECTED Final   Acinetobacter baumannii NOT DETECTED NOT DETECTED Final   Enterobacteriaceae species NOT DETECTED NOT DETECTED Final   Enterobacter cloacae complex NOT DETECTED NOT DETECTED Final   Escherichia coli NOT DETECTED NOT DETECTED Final   Klebsiella oxytoca NOT DETECTED NOT DETECTED Final   Klebsiella pneumoniae NOT DETECTED NOT DETECTED Final   Proteus species NOT DETECTED NOT DETECTED Final   Serratia marcescens NOT DETECTED NOT DETECTED Final   Haemophilus influenzae NOT DETECTED NOT DETECTED Final   Neisseria meningitidis NOT DETECTED NOT DETECTED Final   Pseudomonas aeruginosa NOT DETECTED NOT DETECTED Final   Candida albicans NOT DETECTED NOT DETECTED Final   Candida glabrata NOT DETECTED NOT DETECTED Final   Candida krusei NOT DETECTED NOT DETECTED Final   Candida parapsilosis NOT DETECTED NOT DETECTED Final   Candida tropicalis NOT DETECTED NOT DETECTED Final    Comment: Performed at [facility], [place]. [address] [place], [place] [address]  Culture, blood (routine x 2)     Status: None (Preliminary result)   Collection Time: 07/06/18  8:46 AM  Result Value Ref Range Status   Specimen Description BLOOD RIGHT HAND  Final   Special Requests   Final    BOTTLES DRAWN AEROBIC ONLY Blood Culture adequate volume   Culture   Final    NO GROWTH 1 DAY Performed at [facility], [place] [address], [place], [place] [address]    Report Status PENDING  Incomplete         Radiology Studies: Nm Hepatobiliary Liver Func  Result Date: 07/06/2018 CLINICAL DATA:  Abdominal pain with nausea and vomiting on off since 06/28/2018. Rising bilirubin. Abnormal liver function test. EXAM: NUCLEAR MEDICINE HEPATOBILIARY IMAGING TECHNIQUE: Sequential images of the  abdomen were obtained out to 60 minutes following intravenous administration of radiopharmaceutical. RADIOPHARMACEUTICALS:  6.5 mCi Tc-25m Choletec IV COMPARISON:  Ultrasound, 07/01/2018. FINDINGS: There is prompt and homogeneous accumulation of radiotracer by the liver. Despite imaging over the course of 120 minutes, no radiotracer activity was noted within the common bile duct, gallbladder or small bowel. IMPRESSION: 1. Nonvisualization of the common bile duct, gallbladder and small bowel. This is likely due to hepatocellular dysfunction with depressed biliary excretion. Central biliary obstruction is also possible. Electronically Signed   By: DLajean ManesM.D.   On: 07/06/2018 14:13   Mr Abdomen Mrcp Wo Contrast  Result Date: 07/07/2018 CLINICAL  DATA:  Jaundice, abdominal pain, fever EXAM: MRI ABDOMEN WITHOUT CONTRAST  (INCLUDING MRCP) TECHNIQUE: Multiplanar multisequence MR imaging of the abdomen was performed. Heavily T2-weighted images of the biliary and pancreatic ducts were obtained, and three-dimensional MRCP images were rendered by post processing. COMPARISON:  Right upper quadrant ultrasound dated 07/01/2018 FINDINGS: Motion degraded images. Lower chest: Small bilateral pleural effusions. Hepatobiliary: No morphologic findings of cirrhosis. 2.9 cm cyst in segment 4B (series 4/image 13). Mild intrahepatic and extrahepatic ductal dilatation. Multiple common duct stones, measuring up to 12 mm in the central common duct (series 13/image 6). Distended, irregular gallbladder with numerous layering gallstones and mild gallbladder wall thickening. Given the associated findings and irregular appearance of the gallbladder wall, this appearance is worrisome for acute cholecystitis. Pancreas:  Within normal limits. Spleen:  Within normal limits. Adrenals/Urinary Tract:  Adrenal glands are within normal limits. Kidneys are within normal limits.  No hydronephrosis. Stomach/Bowel: Stomach is notable for a tiny  hiatal hernia. Duodenal diverticulum (series 4/image 25) along the posterior aspect of the 2nd/3rd portion of the duodenum. Visualized bowel is otherwise grossly unremarkable. Vascular/Lymphatic: 7.6 x 9.6 cm infrarenal abdominal aortic aneurysm (series 4/image 33), incompletely visualized. Suspected indwelling aorto bi-iliac stent. No suspicious abdominal lymphadenopathy. Other:  No abdominal ascites. Musculoskeletal: No focal osseous lesions. IMPRESSION: Distended, irregular gallbladder with cholelithiasis and gallbladder wall thickening, favoring acute cholecystitis. Associated intrahepatic and extrahepatic dilatation with multiple common duct stones measuring up to 12 mm. 7.6 x 9.6 cm infrarenal abdominal aortic aneurysm, incompletely visualized. Suspected aorto bi-iliac stents. Electronically Signed   By: Julian Hy M.D.   On: 07/07/2018 15:22   Mr 3d Recon At Scanner  Result Date: 07/07/2018 CLINICAL DATA:  Jaundice, abdominal pain, fever EXAM: MRI ABDOMEN WITHOUT CONTRAST  (INCLUDING MRCP) TECHNIQUE: Multiplanar multisequence MR imaging of the abdomen was performed. Heavily T2-weighted images of the biliary and pancreatic ducts were obtained, and three-dimensional MRCP images were rendered by post processing. COMPARISON:  Right upper quadrant ultrasound dated 07/01/2018 FINDINGS: Motion degraded images. Lower chest: Small bilateral pleural effusions. Hepatobiliary: No morphologic findings of cirrhosis. 2.9 cm cyst in segment 4B (series 4/image 13). Mild intrahepatic and extrahepatic ductal dilatation. Multiple common duct stones, measuring up to 12 mm in the central common duct (series 13/image 6). Distended, irregular gallbladder with numerous layering gallstones and mild gallbladder wall thickening. Given the associated findings and irregular appearance of the gallbladder wall, this appearance is worrisome for acute cholecystitis. Pancreas:  Within normal limits. Spleen:  Within normal limits.  Adrenals/Urinary Tract:  Adrenal glands are within normal limits. Kidneys are within normal limits.  No hydronephrosis. Stomach/Bowel: Stomach is notable for a tiny hiatal hernia. Duodenal diverticulum (series 4/image 25) along the posterior aspect of the 2nd/3rd portion of the duodenum. Visualized bowel is otherwise grossly unremarkable. Vascular/Lymphatic: 7.6 x 9.6 cm infrarenal abdominal aortic aneurysm (series 4/image 33), incompletely visualized. Suspected indwelling aorto bi-iliac stent. No suspicious abdominal lymphadenopathy. Other:  No abdominal ascites. Musculoskeletal: No focal osseous lesions. IMPRESSION: Distended, irregular gallbladder with cholelithiasis and gallbladder wall thickening, favoring acute cholecystitis. Associated intrahepatic and extrahepatic dilatation with multiple common duct stones measuring up to 12 mm. 7.6 x 9.6 cm infrarenal abdominal aortic aneurysm, incompletely visualized. Suspected aorto bi-iliac stents. Electronically Signed   By: Julian Hy M.D.   On: 07/07/2018 15:22        Scheduled Meds: . diltiazem  120 mg Oral Daily  . docusate sodium  100 mg Oral BID  . insulin aspart  0-9 Units  Subcutaneous TID WC  . metoprolol succinate  25 mg Oral BID  . pantoprazole  40 mg Oral BID  . sodium chloride flush  3 mL Intravenous Q12H  . tamsulosin  0.4 mg Oral BID   Continuous Infusions: . sodium chloride 1,000 mL (07/07/18 1144)  . diltiazem (CARDIZEM) infusion Stopped (07/04/18 0945)  . levofloxacin (LEVAQUIN) IV 500 mg (07/07/18 1145)     LOS: 6 days        Aline August, MD [facility] Pager [phone]  If 7PM-7AM, please contact night-coverage [email or web address] Password [facility] 07/08/2018, 9:48 AM

## 2018-07-08 NOTE — H&P (View-Only) (Signed)
Progress Note   Subjective  Patient has no pain, feeling well. He was made NPO today for possible ERCP although was not placed on our schedule. MRCP shows intra and extrahepatic biliary ductal dilation with CBD stones measuring up to 67mm. NO fevers. Of note he has not had Eliquis in 5-6 days.   Objective   Vital signs in last 24 hours: Temp:  [97.6 F (36.4 C)-98.4 F (36.9 C)] 98.4 F (36.9 C) (10/28 0448) Pulse Rate:  [84-107] 97 (10/28 0448) Resp:  [18-20] 18 (10/28 0448) BP: (126-132)/(77-84) 132/79 (10/28 0448) SpO2:  [94 %-96 %] 95 % (10/28 0448) Weight:  [102.3 kg] 102.3 kg (10/28 0448) Last BM Date: 07/07/18 General:    white male in NAD Heart:  Regular rate and rhythm; no murmurs Lungs: Respirations even and unlabored, lungs CTA bilaterally Abdomen:  Soft, nontender and nondistended.  Extremities:  Without edema. Neurologic:  Alert and oriented,  grossly normal neurologically. Psych:  Cooperative. Normal mood and affect.  Intake/Output from previous day: 10/27 0701 - 10/28 0700 In: 855.8 [P.O.:720; I.V.:35.8; IV Piggyback:100] Out: 2300 [Urine:2300] Intake/Output this shift: No intake/output data recorded.  Lab Results: Recent Labs    07/06/18 0601 07/07/18 0630 07/08/18 0522  WBC 5.7 6.6 6.9  HGB 9.3* 9.9* 10.4*  HCT 30.1* 30.9* 32.8*  PLT 109* 154 198   BMET Recent Labs    07/06/18 0601 07/07/18 0630 07/08/18 0522  NA 138 137 138  K 3.7 3.6 3.5  CL 109 109 110  CO2 21* 20* 22  GLUCOSE 102* 93 128*  BUN 24* 21 17  CREATININE 1.67* 1.62* 1.61*  CALCIUM 8.1* 8.0* 8.0*   LFT Recent Labs    07/08/18 0522  PROT 5.3*  ALBUMIN 2.0*  AST 42*  ALT 60*  ALKPHOS 203*  BILITOT 5.0*   PT/INR No results for input(s): LABPROT, INR in the last 72 hours.  Studies/Results: Nm Hepatobiliary Liver Func  Result Date: 07/06/2018 CLINICAL DATA:  Abdominal pain with nausea and vomiting on off since 06/28/2018. Rising bilirubin. Abnormal liver  function test. EXAM: NUCLEAR MEDICINE HEPATOBILIARY IMAGING TECHNIQUE: Sequential images of the abdomen were obtained out to 60 minutes following intravenous administration of radiopharmaceutical. RADIOPHARMACEUTICALS:  6.5 mCi Tc-47m  Choletec IV COMPARISON:  Ultrasound, 07/01/2018. FINDINGS: There is prompt and homogeneous accumulation of radiotracer by the liver. Despite imaging over the course of 120 minutes, no radiotracer activity was noted within the common bile duct, gallbladder or small bowel. IMPRESSION: 1. Nonvisualization of the common bile duct, gallbladder and small bowel. This is likely due to hepatocellular dysfunction with depressed biliary excretion. Central biliary obstruction is also possible. Electronically Signed   By: Lajean Manes M.D.   On: 07/06/2018 14:13   Mr Abdomen Mrcp Wo Contrast  Result Date: 07/07/2018 CLINICAL DATA:  Jaundice, abdominal pain, fever EXAM: MRI ABDOMEN WITHOUT CONTRAST  (INCLUDING MRCP) TECHNIQUE: Multiplanar multisequence MR imaging of the abdomen was performed. Heavily T2-weighted images of the biliary and pancreatic ducts were obtained, and three-dimensional MRCP images were rendered by post processing. COMPARISON:  Right upper quadrant ultrasound dated 07/01/2018 FINDINGS: Motion degraded images. Lower chest: Small bilateral pleural effusions. Hepatobiliary: No morphologic findings of cirrhosis. 2.9 cm cyst in segment 4B (series 4/image 13). Mild intrahepatic and extrahepatic ductal dilatation. Multiple common duct stones, measuring up to 12 mm in the central common duct (series 13/image 6). Distended, irregular gallbladder with numerous layering gallstones and mild gallbladder wall thickening. Given the associated findings and  irregular appearance of the gallbladder wall, this appearance is worrisome for acute cholecystitis. Pancreas:  Within normal limits. Spleen:  Within normal limits. Adrenals/Urinary Tract:  Adrenal glands are within normal limits.  Kidneys are within normal limits.  No hydronephrosis. Stomach/Bowel: Stomach is notable for a tiny hiatal hernia. Duodenal diverticulum (series 4/image 25) along the posterior aspect of the 2nd/3rd portion of the duodenum. Visualized bowel is otherwise grossly unremarkable. Vascular/Lymphatic: 7.6 x 9.6 cm infrarenal abdominal aortic aneurysm (series 4/image 33), incompletely visualized. Suspected indwelling aorto bi-iliac stent. No suspicious abdominal lymphadenopathy. Other:  No abdominal ascites. Musculoskeletal: No focal osseous lesions. IMPRESSION: Distended, irregular gallbladder with cholelithiasis and gallbladder wall thickening, favoring acute cholecystitis. Associated intrahepatic and extrahepatic dilatation with multiple common duct stones measuring up to 12 mm. 7.6 x 9.6 cm infrarenal abdominal aortic aneurysm, incompletely visualized. Suspected aorto bi-iliac stents. Electronically Signed   By: Julian Hy M.D.   On: 07/07/2018 15:22   Mr 3d Recon At Scanner  Result Date: 07/07/2018 CLINICAL DATA:  Jaundice, abdominal pain, fever EXAM: MRI ABDOMEN WITHOUT CONTRAST  (INCLUDING MRCP) TECHNIQUE: Multiplanar multisequence MR imaging of the abdomen was performed. Heavily T2-weighted images of the biliary and pancreatic ducts were obtained, and three-dimensional MRCP images were rendered by post processing. COMPARISON:  Right upper quadrant ultrasound dated 07/01/2018 FINDINGS: Motion degraded images. Lower chest: Small bilateral pleural effusions. Hepatobiliary: No morphologic findings of cirrhosis. 2.9 cm cyst in segment 4B (series 4/image 13). Mild intrahepatic and extrahepatic ductal dilatation. Multiple common duct stones, measuring up to 12 mm in the central common duct (series 13/image 6). Distended, irregular gallbladder with numerous layering gallstones and mild gallbladder wall thickening. Given the associated findings and irregular appearance of the gallbladder wall, this appearance is  worrisome for acute cholecystitis. Pancreas:  Within normal limits. Spleen:  Within normal limits. Adrenals/Urinary Tract:  Adrenal glands are within normal limits. Kidneys are within normal limits.  No hydronephrosis. Stomach/Bowel: Stomach is notable for a tiny hiatal hernia. Duodenal diverticulum (series 4/image 25) along the posterior aspect of the 2nd/3rd portion of the duodenum. Visualized bowel is otherwise grossly unremarkable. Vascular/Lymphatic: 7.6 x 9.6 cm infrarenal abdominal aortic aneurysm (series 4/image 33), incompletely visualized. Suspected indwelling aorto bi-iliac stent. No suspicious abdominal lymphadenopathy. Other:  No abdominal ascites. Musculoskeletal: No focal osseous lesions. IMPRESSION: Distended, irregular gallbladder with cholelithiasis and gallbladder wall thickening, favoring acute cholecystitis. Associated intrahepatic and extrahepatic dilatation with multiple common duct stones measuring up to 12 mm. 7.6 x 9.6 cm infrarenal abdominal aortic aneurysm, incompletely visualized. Suspected aorto bi-iliac stents. Electronically Signed   By: Julian Hy M.D.   On: 07/07/2018 15:22       Assessment / Plan:   84 y/o male with a history of AAA post stenting, AF on Eliquis, pacemaker, admitted after syncope, found to have mildly elevated troponin on admission which normalized. Summary thus far -  E. coli bacteremia, suspected UTI initially.  Nodular appearing liver on ultrasound along with gallstones. LAE elevation. Cardiology had planned cardiac catheterization this admission but decided to wait because of the bacteremia.  He also developed upper GI bleed secondary to [employer] tear. EUS done as well and revealed gallstones and sludge.  Biliary tree was hard to visualize given shadowing stones. HIDA scan inconclusive. MRCP yesterday showed multiple common bile duct stones, and biliary ductal dilation. Per drain not performed as he is currently pain free, WBC normal, LAEs  stable. Surgery has seen.   I have discussed his case with Dr. Ardis Hughs  of [facility]. ERCP is warranted to clear his biliary tree and remove the stones. I have discussed risks / benefits of ERCP with the patient and he wished to proceed. Apparently the patient was told it was happening today but there is no room in the endoscopy suite for ERCP today. His case will be done tomorrow AM at 0930 with Dr. Ardis Hughs. NPO after MN. Last dose of Eliquis was several days ago - please continue to hold.  Call with questions in the interim.  Turner Cellar, MD [facility]

## 2018-07-09 ENCOUNTER — Inpatient Hospital Stay (HOSPITAL_COMMUNITY): Payer: Medicare Other

## 2018-07-09 ENCOUNTER — Inpatient Hospital Stay (HOSPITAL_COMMUNITY): Payer: Medicare Other | Admitting: Anesthesiology

## 2018-07-09 ENCOUNTER — Encounter (HOSPITAL_COMMUNITY): Payer: Self-pay

## 2018-07-09 ENCOUNTER — Encounter (HOSPITAL_COMMUNITY)
Admission: EM | Disposition: A | Discharge status: Home / Self Care | Payer: Self-pay | Source: Home / Self Care | Attending: Internal Medicine

## 2018-07-09 DIAGNOSIS — K8050 Calculus of bile duct without cholangitis or cholecystitis without obstruction: Secondary | ICD-10-CM

## 2018-07-09 DIAGNOSIS — K819 Cholecystitis, unspecified: Secondary | ICD-10-CM

## 2018-07-09 DIAGNOSIS — K8000 Calculus of gallbladder with acute cholecystitis without obstruction: Secondary | ICD-10-CM

## 2018-07-09 HISTORY — PX: SPHINCTEROTOMY: SHX5544

## 2018-07-09 HISTORY — PX: BILIARY STENT PLACEMENT: SHX5538

## 2018-07-09 HISTORY — PX: ERCP: SHX5425

## 2018-07-09 HISTORY — PX: REMOVAL OF STONES: SHX5545

## 2018-07-09 LAB — CBC WITH DIFFERENTIAL/PLATELET
ABS IMMATURE GRANULOCYTES: 0.14 10*3/uL — AB (ref 0.00–0.07)
BASOS ABS: 0 10*3/uL (ref 0.0–0.1)
Basophils Relative: 0 %
EOS PCT: 2 %
Eosinophils Absolute: 0.2 10*3/uL (ref 0.0–0.5)
HEMATOCRIT: 32 % — AB (ref 39.0–52.0)
HEMOGLOBIN: 10 g/dL — AB (ref 13.0–17.0)
Immature Granulocytes: 2 %
LYMPHS ABS: 0.9 10*3/uL (ref 0.7–4.0)
LYMPHS PCT: 13 %
MCH: 28.4 pg (ref 26.0–34.0)
MCHC: 31.3 g/dL (ref 30.0–36.0)
MCV: 90.9 fL (ref 80.0–100.0)
MONO ABS: 0.5 10*3/uL (ref 0.1–1.0)
Monocytes Relative: 7 %
Neutro Abs: 5.7 10*3/uL (ref 1.7–7.7)
Neutrophils Relative %: 76 %
Platelets: 181 10*3/uL (ref 150–400)
RBC: 3.52 MIL/uL — ABNORMAL LOW (ref 4.22–5.81)
RDW: 16.1 % — ABNORMAL HIGH (ref 11.5–15.5)
WBC: 7.4 10*3/uL (ref 4.0–10.5)
nRBC: 0 % (ref 0.0–0.2)

## 2018-07-09 LAB — COMPREHENSIVE METABOLIC PANEL
ALK PHOS: 215 U/L — AB (ref 38–126)
ALT: 57 U/L — AB (ref 0–44)
AST: 48 U/L — ABNORMAL HIGH (ref 15–41)
Albumin: 2 g/dL — ABNORMAL LOW (ref 3.5–5.0)
Anion gap: 7 (ref 5–15)
BILIRUBIN TOTAL: 5 mg/dL — AB (ref 0.3–1.2)
BUN: 15 mg/dL (ref 8–23)
CALCIUM: 7.9 mg/dL — AB (ref 8.9–10.3)
CO2: 22 mmol/L (ref 22–32)
CREATININE: 1.63 mg/dL — AB (ref 0.61–1.24)
Chloride: 110 mmol/L (ref 98–111)
GFR, EST AFRICAN AMERICAN: 44 mL/min — AB (ref >60)
GFR, EST NON AFRICAN AMERICAN: 38 mL/min — AB (ref >60)
Glucose, Bld: 117 mg/dL — ABNORMAL HIGH (ref 70–99)
Potassium: 3.6 mmol/L (ref 3.5–5.1)
Sodium: 139 mmol/L (ref 135–145)
TOTAL PROTEIN: 5.1 g/dL — AB (ref 6.5–8.1)

## 2018-07-09 LAB — CULTURE, BLOOD (ROUTINE X 2): Special Requests: ADEQUATE

## 2018-07-09 LAB — MAGNESIUM: MAGNESIUM: 2 mg/dL (ref 1.7–2.4)

## 2018-07-09 LAB — GLUCOSE, CAPILLARY
GLUCOSE-CAPILLARY: 150 mg/dL — AB (ref 70–99)
GLUCOSE-CAPILLARY: 129 mg/dL — AB (ref 70–99)
Glucose-Capillary: 95 mg/dL (ref 70–99)
Glucose-Capillary: 109 mg/dL — ABNORMAL HIGH (ref 70–99)

## 2018-07-09 SURGERY — ERCP, WITH INTERVENTION IF INDICATED
Anesthesia: General

## 2018-07-09 MED ORDER — LIDOCAINE HCL (CARDIAC) PF 100 MG/5ML IV SOSY
PREFILLED_SYRINGE | INTRAVENOUS | Status: DC | PRN
  Administered 2018-07-09: 100 mg via INTRAVENOUS

## 2018-07-09 MED ORDER — EPHEDRINE SULFATE 50 MG/ML IJ SOLN
INTRAMUSCULAR | Status: DC | PRN
  Administered 2018-07-09: 5 mg via INTRAVENOUS

## 2018-07-09 MED ORDER — INDOMETHACIN 50 MG RE SUPP
RECTAL | Status: AC
  Filled 2018-07-09: qty 2

## 2018-07-09 MED ORDER — INDOMETHACIN 50 MG RE SUPP
RECTAL | Status: DC | PRN
  Administered 2018-07-09: 100 mg via RECTAL

## 2018-07-09 MED ORDER — IOPAMIDOL (ISOVUE-300) INJECTION 61%
INTRAVENOUS | Status: DC | PRN
  Administered 2018-07-09: 90 mL via INTRAVENOUS

## 2018-07-09 MED ORDER — FENTANYL CITRATE (PF) 250 MCG/5ML IJ SOLN
INTRAMUSCULAR | Status: DC | PRN
  Administered 2018-07-09: 100 ug via INTRAVENOUS

## 2018-07-09 MED ORDER — GLUCAGON HCL RDNA (DIAGNOSTIC) 1 MG IJ SOLR
INTRAMUSCULAR | Status: DC | PRN
  Administered 2018-07-09: .5 mg via INTRAVENOUS

## 2018-07-09 MED ORDER — PROPOFOL 10 MG/ML IV BOLUS
INTRAVENOUS | Status: DC | PRN
  Administered 2018-07-09: 100 mg via INTRAVENOUS

## 2018-07-09 MED ORDER — IOPAMIDOL (ISOVUE-300) INJECTION 61%
INTRAVENOUS | Status: AC
  Filled 2018-07-09: qty 50

## 2018-07-09 MED ORDER — GLUCAGON HCL RDNA (DIAGNOSTIC) 1 MG IJ SOLR
INTRAMUSCULAR | Status: AC
  Filled 2018-07-09: qty 1

## 2018-07-09 MED ORDER — SUGAMMADEX SODIUM 200 MG/2ML IV SOLN
INTRAVENOUS | Status: DC | PRN
  Administered 2018-07-09: 200 mg via INTRAVENOUS

## 2018-07-09 MED ORDER — SODIUM CHLORIDE 0.9 % IV SOLN
INTRAVENOUS | Status: DC | PRN
  Administered 2018-07-09: 50 ug/min via INTRAVENOUS

## 2018-07-09 MED ORDER — ROCURONIUM BROMIDE 100 MG/10ML IV SOLN
INTRAVENOUS | Status: DC | PRN
  Administered 2018-07-09: 50 mg via INTRAVENOUS

## 2018-07-09 MED ORDER — PHENYLEPHRINE HCL 10 MG/ML IJ SOLN
INTRAMUSCULAR | Status: DC | PRN
  Administered 2018-07-09: 120 ug via INTRAVENOUS
  Administered 2018-07-09: 160 ug via INTRAVENOUS
  Administered 2018-07-09 (×2): 120 ug via INTRAVENOUS

## 2018-07-09 NOTE — Progress Notes (Signed)
Patient ID: Ernest Parker, male   DOB: Mar 07, 1934, 84 y.o.   MRN: [id number]  PROGRESS NOTE    SCORPIO FORTIN  HWE:[id number] DOB: 01-Jan-1934 DOA: 07/01/2018 PCP: Seward Carol, MD   Brief Narrative:  84 year old male with history of A. fib on Eliquis, status post pacemaker in 09/2017, remote prostate cancer, hypertension, hyperlipidemia presented with  unresponsiveness.  CT of the head was negative for bleed.  Troponin was mildly elevated.  Cardiology was consulted.  He was started on IV fluids for AKI.  LFTs were mildly elevated.  GI was consulted. He was planned for cardiac catheterization on 07/04/2018. Patient had chills on 07/03/2018 and went into A. fib with RVR.  He was started on empiric antibiotics and had to be placed on Cardizem drip. He also had upper GI bleeding.  He had EGD with EUS on 07/05/2018. HIDA scan was inconclusive.  Subsequent MRCP showed acute calculus cholecystitis with choledocholithiasis.  General surgery was also consulted.   Assessment & Plan:   Principal Problem:   Syncope and collapse Active Problems:   Atrial fibrillation with rapid ventricular response (HCC)   Essential hypertension   History of prostate cancer   Abnormal LFTs   CKD (chronic kidney disease) stage 3, GFR 30-59 ml/min (HCC)   PAF (paroxysmal atrial fibrillation) (HCC)   Elevated troponin   Mallory-Weiss syndrome   Cirrhosis of liver without ascites (Circle)   E coli bacteremia   Choledocholithiasis  Acute calculus cholecystitis with choledocholithiasis causing elevated LFTs -MRCP done on 07/07/2018 was suggestive of cholecystitis with choledocholithiasis -General surgery following: They are holding off on plans for percutaneous cholecystostomy drain.  For now, antibiotics will be continued.  If patient becomes symptomatic again, he will need a percutaneous cholecystostomy drain as per general surgery and outpatient elective lap chole after clearance by cardiology. -Continue antibiotic  treatment for now -ERCP today by GI  E. coli bacteremia -Probably from biliary source -Continue Levaquin.  Blood culture from 07/06/2018 was not positive for E. coli but had methicillin-resistant coagulase negative staph, most likely contaminant  UTI -Probably associated with chronic indwelling Foley catheter. -Probably evolving on admission.  Urine culture done at the time of admission from the indwelling Foley catheter was positive for Klebsiella pneumoniae and Enterococcus faecalis.  Foley catheter was changed on 07/03/2018.  Repeat urine cultures from 07/03/2018 is growing the same and organisms. -Antibiotic plan as above  Syncope with elevated troponins -Unclear etiology.  Had prolonged confusion for 20 to 30 minutes -Might be related to orthostatic hypotension: Lasix on hold.  Off IV fluids -Echo showed EF of 55 to 60% -Cardiology had planned for cardiac catheterization but this has been postponed because of bacteremia.  Outpatient follow-up with cardiology  Paroxysmal atrial fibrillation/flutter with rapid ventricular rate; bradycardia status post pacemaker in 1/19 -had to be put on Cardizem drip overnight of 07/03/2018.  Currently rate controlled.  On oral Cardizem and beta-blocker.  Outpatient follow-up with cardiology.  Heparin drip discontinued because of GI bleed.  Will wait for further GI recommendations before starting Eliquis or heparin.  Probable upper GI bleeding -GI following.  Continue Protonix.  Status post EGD with EUS on 07/05/2018 which showed esophageal ulcers, Barrett's esophagus and healing Mallory-Weiss tear.   Acute kidney injury on chronic kidney disease stage III -Improved.  Creatinine today is 1.63;  baseline creatinine is around 1.7 -Lasix on hold.  Repeat a.m. creatinine.  Off IV fluids.  Monitor strict input and output  Chronic urinary retention with chronic  indwelling Foley's catheter -Outpatient follow-up with urology.  Urology had recommended to  replace Foley's catheter while inpatient.  Foley catheter was changed on 06/30/2018.  Thrombocytopenia -Questionable cause.  Resolved  DVT prophylaxis: Heparin discontinued because of probable upper GI bleeding Code Status: DNR Family Communication: Daughter and son at bedside Disposition Plan: Depends on clinical outcome  Consultants: Cardiology/GI/general surgery  Procedures:  Echo Study Conclusions  - Left ventricle: The cavity size was normal. There was moderate   concentric hypertrophy. Systolic function was normal. The   estimated ejection fraction was in the range of 55% to 60%. Wall   motion was normal; there were no regional wall motion   abnormalities. - Aortic valve: There was trivial regurgitation. - Mitral valve: Mildly calcified annulus. There was mild   regurgitation. - Left atrium: The atrium was moderately dilated.  EGD and EUS on 07/05/2018 Findings:      ENDOSCOPIC FINDING: :      No gross lesions were noted in the proximal esophagus.      Three linear esophageal ulcers with no bleeding and stigmata of recent       bleeding were found in the mid esophagus. Biopsies were taken with a       cold forceps for histology.      Two tongues of salmon-colored mucosa were present from 30 to 36 cm. The       maximum longitudinal extent of these esophageal mucosal changes was 6 cm       in length. Biopsies were taken with a cold forceps for histology.      A medium non-bleeding Mallory-Weiss tear was found but looks to be       healing right at the Z-line junction into the hiatal hernia.      The Z-line was irregular and was found 36 cm from the incisors.      A medium-sized hiatal hernia was found. The proximal extent of the       gastric folds (end of tubular esophagus) was 37 cm from the incisors.       The hiatal narrowing was 42 cm from the incisors.      No gross lesions were noted in the entire examined stomach otherwise.       Biopsies were taken with a cold  forceps for histology and Helicobacter       pylori testing.      Multiple diffuse, small erosions were found in the duodenal bulb.      A medium diverticulum where the ampullary diverticulum was noted.      The major papilla was normal but completely intradiverticular.      No gross lesions were noted in the third portion of the duodenum.      ENDOSONOGRAPHIC FINDING: :      Extensive hyperechoic material consistent with sludge was visualized       endosonographically in the gallbladder. However, this obscures the       common hepatic duct region due to the shadowing defects that are noted.      There was dilation in the middle third of the main bile duct. There is       obscuring as a result of the gallbladder itself of the upper third of       the main bile duct. The main CBD in the head of the pancreas before the       cystic duct insertion is 2.6 mm -> 3.4 mm.  Moderate hyperechoic material consistent with sludge was visualized       endosonographically in the presumed middle third of the main bile duct.       This was adjacent to the sludge/debris that was within the gallbladder       region as well. There is subsequent dilation at that region.      Pancreatic parenchymal abnormalities were noted in the genu of the       pancreas, pancreatic body and pancreatic tail. These consisted of       atrophy.      The pancreatic duct had a normal endosonographic appearance in the       pancreatic head (PD =1.1 mm -> 1.7 mm), genu of the pancreas (1.2 mm),       body of the pancreas (0.8 mm), and the tail of the pancreas (0.5 mm).      Endosonographic imaging of the ampulla showed no intramural       (subepithelial) lesion or mass.      A cyst was found in the left lobe of the liver and measured 28 mm by 26       mm in maximal cross-sectional diameter. The cyst was anechoic. It was       thinly septated.      Endosonographic imaging in the rest of the visualized portion of the        liver showed no lesion.      A small amount of fluid - ascites was visualized as an anechoic feature,       was found in the peritoneal cavity.      The celiac region was visualized. Impression:               EGD Impression:                           - No gross lesions in proximal esophagus.                           - Non-bleeding esophageal ulcers in middle                            esophagus. Biopsied.                           - Salmon-colored mucosa suspicious for long-segment                            Barrett's esophagus and classified as Barrett's                            stage C0-M6 per [place] criteria. Biopsied to                            confirm diagnosis.                           - Mallory-Weiss tear that is healing at the distal                            GE [place]unction..                           -  Z-line irregular, 36 cm from the incisors.                           - Medium-sized hiatal hernia.                           - No other gross lesions in the stomach. Biopsied.                           - Erosive duodenopathy.                           - Non-bleeding duodenal diverticulum.                           - Normal major papilla but is located entirely                            intradiverticular.                           - No gross lesions in the third portion of the                            duodenum.                           EUS Impression:                           - Hyperechoic material consistent with sludge and                            debris was visualized endosonographically in the                            gallbladder. There is significant obscuring of the                            common hepatic duct region as a result of this.                           - There was dilation in the middle third of the                            main bile duct and there was evidence/concern for                            hyperechoic material consistent with sludge was                              visualized endosonographically in this region,                            however, the overt cystic duct insertion and then  the development of the gallbladder obscuring                            shadowing defects makes things difficult to see,                            but sludge is noted.                           - Pancreatic parenchymal abnormalities consisting                            of atrophy were noted in the genu of the pancreas,                            pancreatic body and pancreatic tail. But the duct                            itself, had a normal endosonographic appearance in                            the pancreatic head, genu of the pancreas, body of                            the pancreas and tail of the pancreas.                           - A cyst was found in the left lobe of the liver                            and measured 28 mm by 26 mm.                           - Ascites was found on endosonographic examination                            of the peritoneal cavity. Recommendation:           - The patient will be observed post-procedure,                            until all discharge criteria are met.                           - Return patient to hospital ward for ongoing care.                           - Observe patient's clinical course.                           - Regular Diet OK for today.                           - Will plan to proceed with HIDA to evaluate for  evidence of cholecystitis and also to evaluate the                            biliary tree in regards to positioning of                            radiotracer into the duodenum for evaluation of                            partial obstruction or not.                           - Based on the patient's kidney function, and as he                            has not had any cross-sectional imaging in months,                             he needs this to be performed. Ideally would get a                            CT-Abdmen with IV contrast vs a MRI-Abdomen with                            contrast. If kidney function is stable and no plan                            for urgent catheterization then would have medical                            team order scan on Saturday.                           - Reasonable on Saturday to discuss case with                            surgery as HIDA is being completed and his                            cross-sectional CT is performed.                           - Based on his liver function testing and the                            findings above will consider the role of                            cholecystectomy, cholecystostomy tube placement,                            and role of ERCP moving forward.                           -  The findings and recommendations were discussed                            with the patient.                           - The findings and recommendations were discussed                            with the patient's family.                           - The findings and recommendations were discussed                            with the referring physician.                           - GI MD team covering over the weekend.  Antimicrobials: Levaquin 1 dose on 07/03/2018 and then from 07/05/2018 onwards Azactam 1 dose on 07/04/2018   Subjective: Patient seen and examined at bedside.  No new complaints.  Denies worsening abdominal pain, nausea or vomiting.  Objective: Vitals:   07/08/18 1946 07/08/18 2226 07/09/18 0545 07/09/18 0842  BP: (!) 142/76 (!) 150/79 (!) 155/76 (!) 176/88  Pulse: 69 68 66 62  Resp: 18  18 (!) 21  Temp: 98.2 F (36.8 C)  98.6 F (37 C) 98.3 F (36.8 C)  TempSrc: Oral  Oral Oral  SpO2: 96%  97% 95%  Weight:   101.7 kg   Height:        Intake/Output Summary (Last 24 hours) at 07/09/2018 1029 Last data filed at 07/09/2018  0500 Gross per 24 hour  Intake 480 ml  Output 1450 ml  Net -970 ml   Filed Weights   07/07/18 0538 07/08/18 0448 07/09/18 0545  Weight: 103.1 kg 102.3 kg 101.7 kg    Examination:  General exam: No distress Respiratory system: Bilateral decreased breath sounds at bases, with scattered crackles, no wheezing  cardiovascular system: S1-S2 heard, rate controlled gastrointestinal system: Abdomen is nondistended, soft and nontender. Normal bowel sounds heard. Extremities: No cyanosis.  No edema  Data Reviewed: I have personally reviewed following labs and imaging studies  CBC: Recent Labs  Lab 07/05/18 0627 07/06/18 0601 07/07/18 0630 07/08/18 0522 07/09/18 0426  WBC 7.3 5.7 6.6 6.9 7.4  NEUTROABS 5.7 4.5 5.1 5.2 5.7  HGB 9.3* 9.3* 9.9* 10.4* 10.0*  HCT 30.7* 30.1* 30.9* 32.8* 32.0*  MCV 92.7 91.2 90.1 89.1 90.9  PLT 98* 109* 154 198 309   Basic Metabolic Panel: Recent Labs  Lab 07/05/18 0627 07/06/18 0601 07/07/18 0630 07/08/18 0522 07/09/18 0426  NA 140 138 137 138 139  K 3.9 3.7 3.6 3.5 3.6  CL 108 109 109 110 110  CO2 23 21* 20* 22 22  GLUCOSE 106* 102* 93 128* 117*  BUN 31* 24* '21 17 15  ' CREATININE 1.87* 1.67* 1.62* 1.61* 1.63*  CALCIUM 8.4* 8.1* 8.0* 8.0* 7.9*  MG 2.2 2.1 1.9 2.0 2.0   GFR: Estimated Creatinine Clearance: 41.8 mL/min (A) (by C-G formula based on SCr of 1.63 mg/dL (H)). Liver Function Tests: Recent Labs  Lab 07/05/18 0627 07/06/18 0601 07/07/18 0630 07/08/18 0522 07/09/18  0426  AST 38 45* 44* 42* 48*  ALT 95* 79* 66* 60* 57*  ALKPHOS 152* 193* 196* 203* 215*  BILITOT 3.7* 4.2* 5.8* 5.0* 5.0*  PROT 5.5* 5.2* 5.2* 5.3* 5.1*  ALBUMIN 2.2* 2.0* 2.0* 2.0* 2.0*   No results for input(s): LIPASE, AMYLASE in the last 168 hours. No results for input(s): AMMONIA in the last 168 hours. Coagulation Profile: Recent Labs  Lab 07/03/18 0548  INR 1.11   Cardiac Enzymes: Recent Labs  Lab 07/02/18 1435 07/02/18 1903  TROPONINI 0.37*  0.30*   BNP (last 3 results) No results for input(s): PROBNP in the last 8760 hours. HbA1C: No results for input(s): HGBA1C in the last 72 hours. CBG: Recent Labs  Lab 07/08/18 0742 07/08/18 1127 07/08/18 1716 07/08/18 2052 07/09/18 0737  GLUCAP 107* 110* 128* 119* 95   Lipid Profile: No results for input(s): CHOL, HDL, LDLCALC, TRIG, CHOLHDL, LDLDIRECT in the last 72 hours. Thyroid Function Tests: No results for input(s): TSH, T4TOTAL, FREET4, T3FREE, THYROIDAB in the last 72 hours. Anemia Panel: No results for input(s): VITAMINB12, FOLATE, FERRITIN, TIBC, IRON, RETICCTPCT in the last 72 hours. Sepsis Labs: No results for input(s): PROCALCITON, LATICACIDVEN in the last 168 hours.  Recent Results (from the past 240 hour(s))  Culture, Urine     Status: Abnormal   Collection Time: 07/02/18  7:37 AM  Result Value Ref Range Status   Specimen Description URINE, RANDOM  Final   Special Requests   Final    NONE Performed at [facility], [address] [place], [place] [address]    Culture (A)  Final    >=100,000 COLONIES/mL KLEBSIELLA PNEUMONIAE 40,000 COLONIES/mL ENTEROCOCCUS FAECALIS    Report Status 07/05/2018 FINAL  Final   Organism ID, Bacteria KLEBSIELLA PNEUMONIAE (A)  Final   Organism ID, Bacteria ENTEROCOCCUS FAECALIS (A)  Final      Susceptibility   Enterococcus faecalis - MIC*    AMPICILLIN <=2 SENSITIVE Sensitive     LEVOFLOXACIN 0.5 SENSITIVE Sensitive     NITROFURANTOIN <=16 SENSITIVE Sensitive     VANCOMYCIN <=0.5 SENSITIVE Sensitive     * 40,000 COLONIES/mL ENTEROCOCCUS FAECALIS   Klebsiella pneumoniae - MIC*    AMPICILLIN >=32 RESISTANT Resistant     CEFAZOLIN <=4 SENSITIVE Sensitive     CEFTRIAXONE <=1 SENSITIVE Sensitive     CIPROFLOXACIN <=0.25 SENSITIVE Sensitive     GENTAMICIN <=1 SENSITIVE Sensitive     IMIPENEM <=0.25 SENSITIVE Sensitive     NITROFURANTOIN 128 RESISTANT Resistant     TRIMETH/SULFA <=20 SENSITIVE Sensitive      AMPICILLIN/SULBACTAM 16 INTERMEDIATE Intermediate     PIP/TAZO 16 SENSITIVE Sensitive     Extended ESBL NEGATIVE Sensitive     * >=100,000 COLONIES/mL KLEBSIELLA PNEUMONIAE  Culture, blood (routine x 2)     Status: None   Collection Time: 07/02/18 10:45 PM  Result Value Ref Range Status   Specimen Description BLOOD LEFT ANTECUBITAL  Final   Special Requests   Final    BOTTLES DRAWN AEROBIC ONLY Blood Culture results may not be optimal due to an inadequate volume of blood received in culture bottles   Culture   Final    NO GROWTH 5 DAYS Performed at [place] [address] [place], [place] [address]    Report Status 07/07/2018 FINAL  Final  Culture, blood (routine x 2)     Status: None   Collection Time: 07/02/18 10:51 PM  Result Value Ref Range Status  Specimen Description BLOOD LEFT FOREARM  Final   Special Requests   Final    BOTTLES DRAWN AEROBIC AND ANAEROBIC Blood Culture results may not be optimal due to an inadequate volume of blood received in culture bottles   Culture   Final    NO GROWTH 5 DAYS Performed at [facility], [place] [address] [place], [place] [address]    Report Status 07/07/2018 FINAL  Final  Culture, blood (routine x 2)     Status: Abnormal   Collection Time: 07/03/18  4:15 PM  Result Value Ref Range Status   Specimen Description BLOOD RIGHT ANTECUBITAL  Final   Special Requests   Final    BOTTLES DRAWN AEROBIC AND ANAEROBIC Blood Culture adequate volume   Culture  Setup Time   Final    GRAM NEGATIVE RODS IN BOTH AEROBIC AND ANAEROBIC BOTTLES CRITICAL RESULT CALLED TO, READ BACK BY AND VERIFIED WITH: Zoila Shutter [id number] MLM Performed at [facility], [place] [address], [place], [place] [address]    Culture ESCHERICHIA COLI (A)  Final   Report Status 07/06/2018 FINAL  Final   Organism ID, Bacteria ESCHERICHIA COLI  Final      Susceptibility   Escherichia coli - MIC*    AMPICILLIN 4 SENSITIVE Sensitive     CEFAZOLIN <=4  SENSITIVE Sensitive     CEFEPIME <=1 SENSITIVE Sensitive     CEFTAZIDIME <=1 SENSITIVE Sensitive     CEFTRIAXONE <=1 SENSITIVE Sensitive     CIPROFLOXACIN <=0.25 SENSITIVE Sensitive     GENTAMICIN <=1 SENSITIVE Sensitive     IMIPENEM <=0.25 SENSITIVE Sensitive     TRIMETH/SULFA <=20 SENSITIVE Sensitive     AMPICILLIN/SULBACTAM <=2 SENSITIVE Sensitive     PIP/TAZO <=4 SENSITIVE Sensitive     Extended ESBL NEGATIVE Sensitive     * ESCHERICHIA COLI  Blood Culture ID Panel (Reflexed)     Status: Abnormal   Collection Time: 07/03/18  4:15 PM  Result Value Ref Range Status   Enterococcus species NOT DETECTED NOT DETECTED Final   Listeria monocytogenes NOT DETECTED NOT DETECTED Final   Staphylococcus species NOT DETECTED NOT DETECTED Final   Staphylococcus aureus (BCID) NOT DETECTED NOT DETECTED Final   Streptococcus species NOT DETECTED NOT DETECTED Final   Streptococcus agalactiae NOT DETECTED NOT DETECTED Final   Streptococcus pneumoniae NOT DETECTED NOT DETECTED Final   Streptococcus pyogenes NOT DETECTED NOT DETECTED Final   Acinetobacter baumannii NOT DETECTED NOT DETECTED Final   Enterobacteriaceae species DETECTED (A) NOT DETECTED Final    Comment: Enterobacteriaceae represent a large family of gram-negative bacteria, not a single organism. CRITICAL RESULT CALLED TO, READ BACK BY AND VERIFIED WITH: PHARMD V BRYK [id number] MLM    Enterobacter cloacae complex NOT DETECTED NOT DETECTED Final   Escherichia coli DETECTED (A) NOT DETECTED Final    Comment: CRITICAL RESULT CALLED TO, READ BACK BY AND VERIFIED WITH: PHARMD V BRYK [phone] MLM    Klebsiella oxytoca NOT DETECTED NOT DETECTED Final   Klebsiella pneumoniae NOT DETECTED NOT DETECTED Final   Proteus species NOT DETECTED NOT DETECTED Final   Serratia marcescens NOT DETECTED NOT DETECTED Final   Carbapenem resistance NOT DETECTED NOT DETECTED Final   Haemophilus influenzae NOT DETECTED NOT DETECTED Final   Neisseria  meningitidis NOT DETECTED NOT DETECTED Final   Pseudomonas aeruginosa NOT DETECTED NOT DETECTED Final   Candida albicans NOT DETECTED NOT DETECTED Final   Candida glabrata NOT DETECTED NOT DETECTED  Final   Candida krusei NOT DETECTED NOT DETECTED Final   Candida parapsilosis NOT DETECTED NOT DETECTED Final   Candida tropicalis NOT DETECTED NOT DETECTED Final    Comment: Performed at [facility], [place] [address] [place], [place] [address]  Culture, blood (routine x 2)     Status: Abnormal   Collection Time: 07/03/18  4:30 PM  Result Value Ref Range Status   Specimen Description BLOOD LEFT HAND  Final   Special Requests   Final    BOTTLES DRAWN AEROBIC AND ANAEROBIC Blood Culture adequate volume   Culture  Setup Time   Final    GRAM NEGATIVE RODS IN BOTH AEROBIC AND ANAEROBIC BOTTLES CRITICAL VALUE NOTED.  VALUE IS CONSISTENT WITH PREVIOUSLY REPORTED AND CALLED VALUE.    Culture (A)  Final    ESCHERICHIA COLI SUSCEPTIBILITIES PERFORMED ON PREVIOUS CULTURE WITHIN THE LAST 5 DAYS. Performed at [facility], [place] [address], [place], [place] [address]    Report Status 07/06/2018 FINAL  Final  Culture, Urine     Status: Abnormal   Collection Time: 07/03/18  5:08 PM  Result Value Ref Range Status   Specimen Description URINE, CATHETERIZED  Final   Special Requests   Final    NONE Performed at [facility], [place] [address], [place], [place] [address]    Culture (A)  Final    >=100,000 COLONIES/mL KLEBSIELLA PNEUMONIAE >=100,000 COLONIES/mL ENTEROCOCCUS FAECALIS    Report Status 07/07/2018 FINAL  Final   Organism ID, Bacteria KLEBSIELLA PNEUMONIAE (A)  Final   Organism ID, Bacteria ENTEROCOCCUS FAECALIS (A)  Final      Susceptibility   Enterococcus faecalis - MIC*    AMPICILLIN <=2 SENSITIVE Sensitive     LEVOFLOXACIN 1 SENSITIVE Sensitive     NITROFURANTOIN <=16 SENSITIVE Sensitive     VANCOMYCIN 1 SENSITIVE Sensitive     * >=100,000 COLONIES/mL ENTEROCOCCUS  FAECALIS   Klebsiella pneumoniae - MIC*    AMPICILLIN >=32 RESISTANT Resistant     CEFAZOLIN <=4 SENSITIVE Sensitive     CEFTRIAXONE <=1 SENSITIVE Sensitive     CIPROFLOXACIN <=0.25 SENSITIVE Sensitive     GENTAMICIN <=1 SENSITIVE Sensitive     IMIPENEM <=0.25 SENSITIVE Sensitive     NITROFURANTOIN 64 INTERMEDIATE Intermediate     TRIMETH/SULFA <=20 SENSITIVE Sensitive     AMPICILLIN/SULBACTAM 16 INTERMEDIATE Intermediate     PIP/TAZO 16 SENSITIVE Sensitive     Extended ESBL NEGATIVE Sensitive     * >=100,000 COLONIES/mL KLEBSIELLA PNEUMONIAE  Culture, blood (routine x 2)     Status: Abnormal   Collection Time: 07/06/18  8:41 AM  Result Value Ref Range Status   Specimen Description BLOOD RIGHT ARM  Final   Special Requests   Final    BOTTLES DRAWN AEROBIC AND ANAEROBIC Blood Culture adequate volume   Culture  Setup Time   Final    GRAM POSITIVE COCCI IN BOTH AEROBIC AND ANAEROBIC BOTTLES CRITICAL RESULT CALLED TO, READ BACK BY AND VERIFIED WITH: PHARMD B MANCHERIL [phone]    Culture (A)  Final    STAPHYLOCOCCUS SPECIES (COAGULASE NEGATIVE) THE SIGNIFICANCE OF ISOLATING THIS ORGANISM FROM A SINGLE SET OF BLOOD CULTURES WHEN MULTIPLE SETS ARE DRAWN IS UNCERTAIN. PLEASE NOTIFY THE MICROBIOLOGY DEPARTMENT WITHIN ONE WEEK IF SPECIATION AND SENSITIVITIES ARE REQUIRED. Performed at [facility], [place] [address] [place], [place] [address]    Report Status 07/09/2018 FINAL  Final  Blood Culture ID Panel (  Reflexed)     Status: Abnormal   Collection Time: 07/06/18  8:41 AM  Result Value Ref Range Status   Enterococcus species NOT DETECTED NOT DETECTED Final   Listeria monocytogenes NOT DETECTED NOT DETECTED Final   Staphylococcus species DETECTED (A) NOT DETECTED Final    Comment: Methicillin (oxacillin) resistant coagulase negative staphylococcus. Possible blood culture contaminant (unless isolated from more than one blood culture draw or clinical case suggests  pathogenicity). No antibiotic treatment is indicated for blood  culture contaminants. CRITICAL RESULT CALLED TO, READ BACK BY AND VERIFIED WITH: PHARMD B MANCHERIL [phone] MLM    Staphylococcus aureus (BCID) NOT DETECTED NOT DETECTED Final   Methicillin resistance DETECTED (A) NOT DETECTED Final    Comment: CRITICAL RESULT CALLED TO, READ BACK BY AND VERIFIED WITH: PHARMD B MANCHERIL [phone] MLM    Streptococcus species NOT DETECTED NOT DETECTED Final   Streptococcus agalactiae NOT DETECTED NOT DETECTED Final   Streptococcus pneumoniae NOT DETECTED NOT DETECTED Final   Streptococcus pyogenes NOT DETECTED NOT DETECTED Final   Acinetobacter baumannii NOT DETECTED NOT DETECTED Final   Enterobacteriaceae species NOT DETECTED NOT DETECTED Final   Enterobacter cloacae complex NOT DETECTED NOT DETECTED Final   Escherichia coli NOT DETECTED NOT DETECTED Final   Klebsiella oxytoca NOT DETECTED NOT DETECTED Final   Klebsiella pneumoniae NOT DETECTED NOT DETECTED Final   Proteus species NOT DETECTED NOT DETECTED Final   Serratia marcescens NOT DETECTED NOT DETECTED Final   Haemophilus influenzae NOT DETECTED NOT DETECTED Final   Neisseria meningitidis NOT DETECTED NOT DETECTED Final   Pseudomonas aeruginosa NOT DETECTED NOT DETECTED Final   Candida albicans NOT DETECTED NOT DETECTED Final   Candida glabrata NOT DETECTED NOT DETECTED Final   Candida krusei NOT DETECTED NOT DETECTED Final   Candida parapsilosis NOT DETECTED NOT DETECTED Final   Candida tropicalis NOT DETECTED NOT DETECTED Final    Comment: Performed at [facility], [place]. [address], [place], [place] [address]  Culture, blood (routine x 2)     Status: None (Preliminary result)   Collection Time: 07/06/18  8:46 AM  Result Value Ref Range Status   Specimen Description BLOOD RIGHT HAND  Final   Special Requests   Final    BOTTLES DRAWN AEROBIC ONLY Blood Culture adequate volume   Culture   Final    NO GROWTH 2  DAYS Performed at [facility], [place] [address] [place], [place] [address]    Report Status PENDING  Incomplete         Radiology Studies: Mr Abdomen Mrcp Wo Contrast  Result Date: 07/07/2018 CLINICAL DATA:  Jaundice, abdominal pain, fever EXAM: MRI ABDOMEN WITHOUT CONTRAST  (INCLUDING MRCP) TECHNIQUE: Multiplanar multisequence MR imaging of the abdomen was performed. Heavily T2-weighted images of the biliary and pancreatic ducts were obtained, and three-dimensional MRCP images were rendered by post processing. COMPARISON:  Right upper quadrant ultrasound dated 07/01/2018 FINDINGS: Motion degraded images. Lower chest: Small bilateral pleural effusions. Hepatobiliary: No morphologic findings of cirrhosis. 2.9 cm cyst in segment 4B (series 4/image 13). Mild intrahepatic and extrahepatic ductal dilatation. Multiple common duct stones, measuring up to 12 mm in the central common duct (series 13/image 6). Distended, irregular gallbladder with numerous layering gallstones and mild gallbladder wall thickening. Given the associated findings and irregular appearance of the gallbladder wall, this appearance is worrisome for acute cholecystitis. Pancreas:  Within normal limits. Spleen:  Within normal limits. Adrenals/Urinary Tract:  Adrenal glands are within normal  limits. Kidneys are within normal limits.  No hydronephrosis. Stomach/Bowel: Stomach is notable for a tiny hiatal hernia. Duodenal diverticulum (series 4/image 25) along the posterior aspect of the 2nd/3rd portion of the duodenum. Visualized bowel is otherwise grossly unremarkable. Vascular/Lymphatic: 7.6 x 9.6 cm infrarenal abdominal aortic aneurysm (series 4/image 33), incompletely visualized. Suspected indwelling aorto bi-iliac stent. No suspicious abdominal lymphadenopathy. Other:  No abdominal ascites. Musculoskeletal: No focal osseous lesions. IMPRESSION: Distended, irregular gallbladder with cholelithiasis and gallbladder wall thickening,  favoring acute cholecystitis. Associated intrahepatic and extrahepatic dilatation with multiple common duct stones measuring up to 12 mm. 7.6 x 9.6 cm infrarenal abdominal aortic aneurysm, incompletely visualized. Suspected aorto bi-iliac stents. Electronically Signed   By: Julian Hy M.D.   On: 07/07/2018 15:22   Mr 3d Recon At Scanner  Result Date: 07/07/2018 CLINICAL DATA:  Jaundice, abdominal pain, fever EXAM: MRI ABDOMEN WITHOUT CONTRAST  (INCLUDING MRCP) TECHNIQUE: Multiplanar multisequence MR imaging of the abdomen was performed. Heavily T2-weighted images of the biliary and pancreatic ducts were obtained, and three-dimensional MRCP images were rendered by post processing. COMPARISON:  Right upper quadrant ultrasound dated 07/01/2018 FINDINGS: Motion degraded images. Lower chest: Small bilateral pleural effusions. Hepatobiliary: No morphologic findings of cirrhosis. 2.9 cm cyst in segment 4B (series 4/image 13). Mild intrahepatic and extrahepatic ductal dilatation. Multiple common duct stones, measuring up to 12 mm in the central common duct (series 13/image 6). Distended, irregular gallbladder with numerous layering gallstones and mild gallbladder wall thickening. Given the associated findings and irregular appearance of the gallbladder wall, this appearance is worrisome for acute cholecystitis. Pancreas:  Within normal limits. Spleen:  Within normal limits. Adrenals/Urinary Tract:  Adrenal glands are within normal limits. Kidneys are within normal limits.  No hydronephrosis. Stomach/Bowel: Stomach is notable for a tiny hiatal hernia. Duodenal diverticulum (series 4/image 25) along the posterior aspect of the 2nd/3rd portion of the duodenum. Visualized bowel is otherwise grossly unremarkable. Vascular/Lymphatic: 7.6 x 9.6 cm infrarenal abdominal aortic aneurysm (series 4/image 33), incompletely visualized. Suspected indwelling aorto bi-iliac stent. No suspicious abdominal lymphadenopathy. Other:   No abdominal ascites. Musculoskeletal: No focal osseous lesions. IMPRESSION: Distended, irregular gallbladder with cholelithiasis and gallbladder wall thickening, favoring acute cholecystitis. Associated intrahepatic and extrahepatic dilatation with multiple common duct stones measuring up to 12 mm. 7.6 x 9.6 cm infrarenal abdominal aortic aneurysm, incompletely visualized. Suspected aorto bi-iliac stents. Electronically Signed   By: Julian Hy M.D.   On: 07/07/2018 15:22        Scheduled Meds: . [MAR Hold] diltiazem  120 mg Oral Daily  . [MAR Hold] docusate sodium  100 mg Oral BID  . [MAR Hold] insulin aspart  0-9 Units Subcutaneous TID WC  . [MAR Hold] metoprolol succinate  25 mg Oral BID  . [MAR Hold] pantoprazole  40 mg Oral BID  . [MAR Hold] sodium chloride flush  3 mL Intravenous Q12H  . [MAR Hold] tamsulosin  0.4 mg Oral BID   Continuous Infusions: . [MAR Hold] sodium chloride 1,000 mL (07/07/18 1144)  . diltiazem (CARDIZEM) infusion Stopped (07/04/18 0945)  . [MAR Hold] levofloxacin (LEVAQUIN) IV Stopped (07/08/18 1316)     LOS: 7 days        Aline August, MD [facility] Pager [phone]  If 7PM-7AM, please contact night-coverage [email or web address] Password [email or web address] 07/09/2018, 10:29 AM

## 2018-07-09 NOTE — Progress Notes (Addendum)
Patient ID: Ernest Parker, male   DOB: 08-09-1934, 84 y.o.   MRN: [id number]    4 Days Post-Op  Subjective: Patient with no complaints.  Ate some yesterday with no issues.  Just decrease in normal appetite.  Objective: Vital signs in last 24 hours: Temp:  [97.4 F (36.3 C)-98.6 F (37 C)] 98.6 F (37 C) (10/29 0545) Pulse Rate:  [66-111] 66 (10/29 0545) Resp:  [18] 18 (10/29 0545) BP: (130-155)/(76-86) 155/76 (10/29 0545) SpO2:  [96 %-98 %] 97 % (10/29 0545) Weight:  [101.7 kg] 101.7 kg (10/29 0545) Last BM Date: 07/07/18  Intake/Output from previous day: 10/28 0701 - 10/29 0700 In: 480 [P.O.:480] Out: 1450 [Urine:1450] Intake/Output this shift: No intake/output data recorded.  PE: Heart: seems regular today Lungs: CTAB Abd: soft, NT, ND, +BS  Lab Results:  Recent Labs    07/08/18 0522 07/09/18 0426  WBC 6.9 7.4  HGB 10.4* 10.0*  HCT 32.8* 32.0*  PLT 198 181   BMET Recent Labs    07/08/18 0522 07/09/18 0426  NA 138 139  K 3.5 3.6  CL 110 110  CO2 22 22  GLUCOSE 128* 117*  BUN 17 15  CREATININE 1.61* 1.63*  CALCIUM 8.0* 7.9*   PT/INR No results for input(s): LABPROT, INR in the last 72 hours. CMP     Component Value Date/Time   NA 139 07/09/2018 0426   NA 141 12/13/2017 0751   K 3.6 07/09/2018 0426   CL 110 07/09/2018 0426   CO2 22 07/09/2018 0426   GLUCOSE 117 (H) 07/09/2018 0426   BUN 15 07/09/2018 0426   BUN 19 12/13/2017 0751   CREATININE 1.63 (H) 07/09/2018 0426   CALCIUM 7.9 (L) 07/09/2018 0426   PROT 5.1 (L) 07/09/2018 0426   ALBUMIN 2.0 (L) 07/09/2018 0426   AST 48 (H) 07/09/2018 0426   ALT 57 (H) 07/09/2018 0426   ALKPHOS 215 (H) 07/09/2018 0426   BILITOT 5.0 (H) 07/09/2018 0426   GFRNONAA 38 (L) 07/09/2018 0426   GFRAA 44 (L) 07/09/2018 0426   Lipase  No results found for: LIPASE     Studies/Results: Mr Abdomen Mrcp Wo Contrast  Result Date: 07/07/2018 CLINICAL DATA:  Jaundice, abdominal pain, fever EXAM: MRI ABDOMEN  WITHOUT CONTRAST  (INCLUDING MRCP) TECHNIQUE: Multiplanar multisequence MR imaging of the abdomen was performed. Heavily T2-weighted images of the biliary and pancreatic ducts were obtained, and three-dimensional MRCP images were rendered by post processing. COMPARISON:  Right upper quadrant ultrasound dated 07/01/2018 FINDINGS: Motion degraded images. Lower chest: Small bilateral pleural effusions. Hepatobiliary: No morphologic findings of cirrhosis. 2.9 cm cyst in segment 4B (series 4/image 13). Mild intrahepatic and extrahepatic ductal dilatation. Multiple common duct stones, measuring up to 12 mm in the central common duct (series 13/image 6). Distended, irregular gallbladder with numerous layering gallstones and mild gallbladder wall thickening. Given the associated findings and irregular appearance of the gallbladder wall, this appearance is worrisome for acute cholecystitis. Pancreas:  Within normal limits. Spleen:  Within normal limits. Adrenals/Urinary Tract:  Adrenal glands are within normal limits. Kidneys are within normal limits.  No hydronephrosis. Stomach/Bowel: Stomach is notable for a tiny hiatal hernia. Duodenal diverticulum (series 4/image 25) along the posterior aspect of the 2nd/3rd portion of the duodenum. Visualized bowel is otherwise grossly unremarkable. Vascular/Lymphatic: 7.6 x 9.6 cm infrarenal abdominal aortic aneurysm (series 4/image 33), incompletely visualized. Suspected indwelling aorto bi-iliac stent. No suspicious abdominal lymphadenopathy. Other:  No abdominal ascites. Musculoskeletal: No focal osseous lesions. IMPRESSION:  Distended, irregular gallbladder with cholelithiasis and gallbladder wall thickening, favoring acute cholecystitis. Associated intrahepatic and extrahepatic dilatation with multiple common duct stones measuring up to 12 mm. 7.6 x 9.6 cm infrarenal abdominal aortic aneurysm, incompletely visualized. Suspected aorto bi-iliac stents. Electronically Signed   By:  Julian Hy M.D.   On: 07/07/2018 15:22   Mr 3d Recon At Scanner  Result Date: 07/07/2018 CLINICAL DATA:  Jaundice, abdominal pain, fever EXAM: MRI ABDOMEN WITHOUT CONTRAST  (INCLUDING MRCP) TECHNIQUE: Multiplanar multisequence MR imaging of the abdomen was performed. Heavily T2-weighted images of the biliary and pancreatic ducts were obtained, and three-dimensional MRCP images were rendered by post processing. COMPARISON:  Right upper quadrant ultrasound dated 07/01/2018 FINDINGS: Motion degraded images. Lower chest: Small bilateral pleural effusions. Hepatobiliary: No morphologic findings of cirrhosis. 2.9 cm cyst in segment 4B (series 4/image 13). Mild intrahepatic and extrahepatic ductal dilatation. Multiple common duct stones, measuring up to 12 mm in the central common duct (series 13/image 6). Distended, irregular gallbladder with numerous layering gallstones and mild gallbladder wall thickening. Given the associated findings and irregular appearance of the gallbladder wall, this appearance is worrisome for acute cholecystitis. Pancreas:  Within normal limits. Spleen:  Within normal limits. Adrenals/Urinary Tract:  Adrenal glands are within normal limits. Kidneys are within normal limits.  No hydronephrosis. Stomach/Bowel: Stomach is notable for a tiny hiatal hernia. Duodenal diverticulum (series 4/image 25) along the posterior aspect of the 2nd/3rd portion of the duodenum. Visualized bowel is otherwise grossly unremarkable. Vascular/Lymphatic: 7.6 x 9.6 cm infrarenal abdominal aortic aneurysm (series 4/image 33), incompletely visualized. Suspected indwelling aorto bi-iliac stent. No suspicious abdominal lymphadenopathy. Other:  No abdominal ascites. Musculoskeletal: No focal osseous lesions. IMPRESSION: Distended, irregular gallbladder with cholelithiasis and gallbladder wall thickening, favoring acute cholecystitis. Associated intrahepatic and extrahepatic dilatation with multiple common duct  stones measuring up to 12 mm. 7.6 x 9.6 cm infrarenal abdominal aortic aneurysm, incompletely visualized. Suspected aorto bi-iliac stents. Electronically Signed   By: Julian Hy M.D.   On: 07/07/2018 15:22    Anti-infectives: Anti-infectives (From admission, onward)   Start     Dose/Rate Route Frequency Ordered Stop   07/05/18 1200  levofloxacin (LEVAQUIN) IVPB 500 mg     500 mg 100 mL/hr over 60 Minutes Intravenous Every 24 hours 07/05/18 1049     07/04/18 1800  Levofloxacin (LEVAQUIN) IVPB 250 mg  Status:  Discontinued     250 mg 50 mL/hr over 60 Minutes Intravenous Every 24 hours 07/03/18 1704 07/04/18 0940   07/04/18 0945  aztreonam (AZACTAM) 1 g in sodium chloride 0.9 % 100 mL IVPB  Status:  Discontinued     1 g 200 mL/hr over 30 Minutes Intravenous Every 8 hours 07/04/18 0940 07/05/18 1049   07/03/18 1645  levofloxacin (LEVAQUIN) IVPB 500 mg     500 mg 100 mL/hr over 60 Minutes Intravenous  Once 07/03/18 1633 07/03/18 2035       Assessment/Plan Choledocholithiasis/cholecystitis  -ERCP today by GI -cont to hold on perc drain as he is otherwise asymptomatic currently. -if he becomes symptomatic, he will need a perc chole drain.  If not, then cont abx and have him follow up as an outpatient after cleared by cards for elective lap chole  Bacteremia, E. Coli -on abx therapy UTI -CXs reveal Klebsiella pneumo and Enterococcus faecalis -treatment per primary team UGI bleed, Mallory-Weiss tear, esophagitis, duodenitis -on PPI therapy, per GI A. Fib -on cardizem and eliquis CAD -needs ischemia work up as outpatient per cardiology notes  FEN - NPO VTE - SCDs/ eliquis on hold ID - Levaquin  ADDENDUM: Spoke to Dr. Ardis Hughs after ERCP.  Patient has significant stone burden in CBD up into hepatic duct and in cystic duct.  1/3-1/2 of the stones were removed and a stent was placed.  However, he encountered copious amounts of purulent drainage.  Given his bacteremia,  cholangitis, and overall diagnostic findings, despite no pain, we will plan to proceed with a perc chole drain.  He will still need cardiac work up as an outpatient for surgical clearance down the road.  Also given the duration of his situation, he is at significant increase risk of surgical complications.  Therefore, we feel at this time a perc chole is the patient's safest option.   LOS: 7 days    Henreitta Cea , [facility] 07/09/2018, 8:05 AM Pager: [phone]

## 2018-07-09 NOTE — Transfer of Care (Signed)
Immediate Anesthesia Transfer of Care Note  Patient: Ernest Parker  Procedure(s) Performed: ENDOSCOPIC RETROGRADE CHOLANGIOPANCREATOGRAPHY (ERCP) (N/A ) SPHINCTEROTOMY REMOVAL OF STONES BILIARY STENT PLACEMENT  Patient Location: Endoscopy Unit  Anesthesia Type:General  Level of Consciousness: awake, alert , oriented and patient cooperative  Airway & Oxygen Therapy: Patient Spontanous Breathing and Patient connected to nasal cannula oxygen  Post-op Assessment: Report given to RN and Post -op Vital signs reviewed and stable  Post vital signs: Reviewed and stable  Last Vitals:  Vitals Value Taken Time  BP 118/49 07/09/2018 11:10 AM  Temp 36.6 C 07/09/2018 11:04 AM  Pulse 61 07/09/2018 11:11 AM  Resp 22 07/09/2018 11:11 AM  SpO2 98 % 07/09/2018 11:11 AM  Vitals shown include unvalidated device data.  Last Pain:  Vitals:   07/09/18 1110  TempSrc:   PainSc: 0-No pain         Complications: No apparent anesthesia complications

## 2018-07-09 NOTE — Anesthesia Postprocedure Evaluation (Signed)
Anesthesia Post Note  Patient: Ernest Parker  Procedure(s) Performed: ENDOSCOPIC RETROGRADE CHOLANGIOPANCREATOGRAPHY (ERCP) (N/A ) SPHINCTEROTOMY REMOVAL OF STONES BILIARY STENT PLACEMENT     Patient location during evaluation: Endoscopy Anesthesia Type: General Level of consciousness: awake and alert Pain management: pain level controlled Vital Signs Assessment: post-procedure vital signs reviewed and stable Respiratory status: spontaneous breathing, nonlabored ventilation, respiratory function stable and patient connected to nasal cannula oxygen Cardiovascular status: blood pressure returned to baseline and stable Postop Assessment: no apparent nausea or vomiting Anesthetic complications: no    Last Vitals:  Vitals:   07/09/18 1130 07/09/18 1243  BP: (!) 143/63 (!) 144/81  Pulse: 60 62  Resp: (!) 21 18  Temp:  36.5 C  SpO2: 94% 94%    Last Pain:  Vitals:   07/09/18 1243  TempSrc: Oral  PainSc: 0-No pain                 Zelma Snead L Lidya Mccalister

## 2018-07-09 NOTE — Plan of Care (Signed)
  Problem: Education: Goal: Knowledge of General Education information will improve Description Including pain rating scale, medication(s)/side effects and non-pharmacologic comfort measures Outcome: Progressing   

## 2018-07-09 NOTE — Anesthesia Procedure Notes (Signed)
Procedure Name: Intubation Date/Time: 07/09/2018 9:50 AM Performed by: Shirlyn Goltz, CRNA Pre-anesthesia Checklist: Patient identified, Emergency Drugs available, Suction available and Patient being monitored Patient Re-evaluated:Patient Re-evaluated prior to induction Oxygen Delivery Method: Circle system utilized Preoxygenation: Pre-oxygenation with 100% oxygen Induction Type: IV induction Ventilation: Mask ventilation without difficulty and Oral airway inserted - appropriate to patient size Laryngoscope Size: Mac and 3 Grade View: Grade I Tube type: Oral Tube size: 7.5 mm Number of attempts: 1 Airway Equipment and Method: Stylet Placement Confirmation: ETT inserted through vocal cords under direct vision,  positive ETCO2 and breath sounds checked- equal and bilateral Secured at: 20 cm Tube secured with: Tape Dental Injury: Teeth and Oropharynx as per pre-operative assessment

## 2018-07-09 NOTE — Anesthesia Preprocedure Evaluation (Addendum)
Anesthesia Evaluation  Patient identified by MRN, date of birth, ID band Patient awake    Reviewed: Allergy & Precautions, NPO status , Patient's Chart, lab work & pertinent test results  Airway Mallampati: I  TM Distance: >3 FB Neck ROM: Full    Dental no notable dental hx. (+) Edentulous Upper, Edentulous Lower   Pulmonary former smoker,    Pulmonary exam normal breath sounds clear to auscultation       Cardiovascular hypertension, + dysrhythmias Atrial Fibrillation + pacemaker  Rhythm:Irregular Rate:Normal  TTE 06/2018 - Left ventricle: The cavity size was normal. There was moderate concentric hypertrophy. Systolic function was normal. The estimated ejection fraction was in the range of 55% to 60%. Wall motion was normal; there were no regional wall motion abnormalities. - Aortic valve: There was trivial regurgitation. - Mitral valve: Mildly calcified annulus. There was mild   regurgitation. - Left atrium: The atrium was moderately dilated.   Neuro/Psych negative neurological ROS  negative psych ROS   GI/Hepatic GERD  ,(+) Cirrhosis       ,   Endo/Other  negative endocrine ROS  Renal/GU Renal diseaseCKD Stage III  negative genitourinary   Musculoskeletal negative musculoskeletal ROS (+)   Abdominal   Peds  Hematology  (+) Blood dyscrasia, anemia ,   Anesthesia Other Findings On eliquis Found unresponsive on 10/21. Hospital course c/b acute calculus cholecystitis, E Coli bacteremia, UGIB, Afib with RVR (on cardizem 10/23, now off)  Reproductive/Obstetrics                           Anesthesia Physical Anesthesia Plan  ASA: III  Anesthesia Plan: General   Post-op Pain Management:    Induction: Intravenous  PONV Risk Score and Plan: 2 and Treatment may vary due to age or medical condition  Airway Management Planned: Oral ETT  Additional Equipment:   Intra-op Plan:    Post-operative Plan: Extubation in OR  Informed Consent: I have reviewed the patients History and Physical, chart, labs and discussed the procedure including the risks, benefits and alternatives for the proposed anesthesia with the patient or authorized representative who has indicated his/her understanding and acceptance.   Dental advisory given  Plan Discussed with: CRNA  Anesthesia Plan Comments: (Discussed code status with patient. Plan to reverse code status to full code for the perioperative period. )      Anesthesia Quick Evaluation

## 2018-07-09 NOTE — Plan of Care (Signed)
  Problem: Education: Goal: Knowledge of General Education information will improve Description Including pain rating scale, medication(s)/side effects and non-pharmacologic comfort measures Outcome: Progressing   Problem: Clinical Measurements: Goal: Ability to maintain clinical measurements within normal limits will improve Outcome: Progressing   Problem: Activity: Goal: Risk for activity intolerance will decrease Outcome: Progressing   Problem: Coping: Goal: Level of anxiety will decrease Outcome: Progressing   Problem: Education: Goal: Understanding of CV disease, CV risk reduction, and recovery process will improve Outcome: Progressing   Problem: Health Behavior/Discharge Planning: Goal: Ability to safely manage health-related needs after discharge will improve Outcome: Progressing

## 2018-07-09 NOTE — Interval H&P Note (Signed)
History and Physical Interval Note:  07/09/2018 8:51 AM  Ernest Parker  has presented today for surgery, with the diagnosis of choledocholithiasis  The various methods of treatment have been discussed with the patient and family. After consideration of risks, benefits and other options for treatment, the patient has consented to  Procedure(s): ENDOSCOPIC RETROGRADE CHOLANGIOPANCREATOGRAPHY (ERCP) (N/A) as a surgical intervention .  The patient's history has been reviewed, patient examined, no change in status, stable for surgery.  I have reviewed the patient's chart and labs.  Questions were answered to the patient's satisfaction.     Milus Banister

## 2018-07-09 NOTE — Op Note (Signed)
[facility] Patient Name: Ernest Parker Procedure Date : 07/09/2018 MRN: [id number] Attending MD: Milus Banister , MD Date of Birth: 1934/06/13 CSN: [id number] Age: 84 Admit Type: Inpatient Procedure:                ERCP Indications:              Common bile duct stone(s) Providers:                Milus Banister, MD, Burtis Junes, RN, Alan Mulder,                            Technician, Tawni Carnes, CRNA Referring MD:             Jolly Mango, MD Medicines:                General Anesthesia, Levaquin 500 mg IV (on hosp                            floor), Indomethacin 540 mg PR Complications:            No immediate complications. Estimated blood loss:                            None Estimated Blood Loss:     Estimated blood loss: none. Procedure:                Pre-Anesthesia Assessment:                           - Prior to the procedure, a History and Physical                            was performed, and patient medications and                            allergies were reviewed. The patient's tolerance of                            previous anesthesia was also reviewed. The risks                            and benefits of the procedure and the sedation                            options and risks were discussed with the patient.                            All questions were answered, and informed consent                            was obtained. Prior Anticoagulants: The patient has                            taken no previous anticoagulant or antiplatelet  agents. ASA Grade Assessment: III - A patient with                            severe systemic disease. After reviewing the risks                            and benefits, the patient was deemed in                            satisfactory condition to undergo the procedure.                           After obtaining informed consent, the scope was                            passed under  direct vision. Throughout the                            procedure, the patient's blood pressure, pulse, and                            oxygen saturations were monitored continuously. The                            [id number]-Q180V ([id number]) Olympus ERCP was introduced                            through the mouth, and used to inject contrast into                            and used to inject contrast into the bile duct. The                            ERCP was accomplished without difficulty. The                            patient tolerated the procedure well. Scope In: Scope Out: Findings:      Scout film was normal. The duodenoscope was advanced to the region of       the major papilla without detailed examination of the UGI tract. There       was a large, food filled periampullary diverticulum that distorts the       ampullary anatomy somewhat. A 44 [employer] over a .035 hydrajag wire was       used to cannulate the bile duct and contrast was injected. Cholangiogram       revealed multiple bile duct stones, multiple cystic duct stones, CHD       dilation (to 8-60mm) which seems to originate around the level of the       cystic duct takeoff, in a region with multiple stones. I performed a       biliary sphincterotomy and then swept the bile duct several times with a       9-1mm biliary retrieval balloon. Multiple brown geometric stones were       swept into the duodenum.  There was also extenstive purulence passing       into the duodenum throughout the case (quite a bit more purulence than       usual, even in setting of known cholangitis). I was unable to remove       about 1/2 of the biliary stones which are all located above the cystic       duct takeoff. I am very suspicious of a signficant edema, narrowing of       the bile duct due to active infection and possibly a Mirrizi-like       compression from the cystic duct. I elected to place a plastic,       removable 8.5 Fr, 12cm long stent  into the bile duct with proximal end       clearly above the visible biliary stones and distal end extending into       the duodenum. The pancreatic duct was never cannulated with wire or       injected with dye. Impression:               - Choledocholithiasis, partially treated with                            sphincterotomy, balloon sweeping and then placement                            of plastic stent.                           - Cyst duct stones evident.                           - Much more purulence than is usual. I believe he                            has cholangitis (hopefully controlled now with                            stent and partial stone removal) and probably also                            has cholecystitis. Recommendation:           - Return patient to hospital ward for ongoing care.                           - Case was discussed with surgery team, I                            recommended either gallbladder resection or                            percutaneous gallbladder drain. He will eventually                            need repeat ERCP to remove the biliary stent and  attempt to clear the bile duct again. Ideally 6-7                            weeks from now. Procedure Code(s):        --- Professional ---                           [id number], Endoscopic retrograde                            cholangiopancreatography (ERCP); with placement of                            endoscopic stent into biliary or pancreatic duct,                            including pre- and post-dilation and guide wire                            passage, when performed, including sphincterotomy,                            when performed, each stent                           [id number], Endoscopic retrograde                            cholangiopancreatography (ERCP); with removal of                            calculi/debris from biliary/pancreatic duct(s) Diagnosis Code(s):         --- Professional ---                           K80.50, Calculus of bile duct without cholangitis                            or cholecystitis without obstruction CPT copyright 2018 [employer]. All rights reserved. The codes documented in this report are preliminary and upon coder review may  be revised to meet current compliance requirements. Milus Banister, MD 07/09/2018 11:46:19 AM This report has been signed electronically. Number of Addenda: 0

## 2018-07-10 ENCOUNTER — Inpatient Hospital Stay (HOSPITAL_COMMUNITY): Payer: Medicare Other

## 2018-07-10 ENCOUNTER — Encounter (HOSPITAL_COMMUNITY): Payer: Self-pay | Admitting: Gastroenterology

## 2018-07-10 DIAGNOSIS — K810 Acute cholecystitis: Secondary | ICD-10-CM

## 2018-07-10 HISTORY — PX: IR PERC CHOLECYSTOSTOMY: IMG2326

## 2018-07-10 LAB — GLUCOSE, CAPILLARY
GLUCOSE-CAPILLARY: 138 mg/dL — AB (ref 70–99)
Glucose-Capillary: 93 mg/dL (ref 70–99)
Glucose-Capillary: 82 mg/dL (ref 70–99)
Glucose-Capillary: 161 mg/dL — ABNORMAL HIGH (ref 70–99)

## 2018-07-10 LAB — COMPREHENSIVE METABOLIC PANEL
ALBUMIN: 2 g/dL — AB (ref 3.5–5.0)
ALT: 50 U/L — AB (ref 0–44)
AST: 40 U/L (ref 15–41)
Alkaline Phosphatase: 178 U/L — ABNORMAL HIGH (ref 38–126)
Anion gap: 5 (ref 5–15)
BUN: 17 mg/dL (ref 8–23)
CHLORIDE: 110 mmol/L (ref 98–111)
CO2: 24 mmol/L (ref 22–32)
CREATININE: 1.6 mg/dL — AB (ref 0.61–1.24)
Calcium: 8.1 mg/dL — ABNORMAL LOW (ref 8.9–10.3)
GFR calc Af Amer: 45 mL/min — ABNORMAL LOW (ref >60)
GFR, EST NON AFRICAN AMERICAN: 38 mL/min — AB (ref >60)
Glucose, Bld: 96 mg/dL (ref 70–99)
POTASSIUM: 4.1 mmol/L (ref 3.5–5.1)
Sodium: 139 mmol/L (ref 135–145)
Total Bilirubin: 3.3 mg/dL — ABNORMAL HIGH (ref 0.3–1.2)
Total Protein: 5 g/dL — ABNORMAL LOW (ref 6.5–8.1)

## 2018-07-10 LAB — CBC WITH DIFFERENTIAL/PLATELET
ABS IMMATURE GRANULOCYTES: 0.13 10*3/uL — AB (ref 0.00–0.07)
BASOS ABS: 0 10*3/uL (ref 0.0–0.1)
BASOS PCT: 0 %
EOS ABS: 0.2 10*3/uL (ref 0.0–0.5)
Eosinophils Relative: 3 %
HCT: 31.1 % — ABNORMAL LOW (ref 39.0–52.0)
Hemoglobin: 9.7 g/dL — ABNORMAL LOW (ref 13.0–17.0)
IMMATURE GRANULOCYTES: 2 %
Lymphocytes Relative: 13 %
Lymphs Abs: 0.9 10*3/uL (ref 0.7–4.0)
MCH: 28.8 pg (ref 26.0–34.0)
MCHC: 31.2 g/dL (ref 30.0–36.0)
MCV: 92.3 fL (ref 80.0–100.0)
MONOS PCT: 6 %
Monocytes Absolute: 0.4 10*3/uL (ref 0.1–1.0)
NEUTROS ABS: 5.4 10*3/uL (ref 1.7–7.7)
NEUTROS PCT: 76 %
NRBC: 0 % (ref 0.0–0.2)
PLATELETS: 199 10*3/uL (ref 150–400)
RBC: 3.37 MIL/uL — AB (ref 4.22–5.81)
RDW: 16.1 % — AB (ref 11.5–15.5)
WBC: 7.3 10*3/uL (ref 4.0–10.5)

## 2018-07-10 LAB — PROTIME-INR
INR: 1.05
Prothrombin Time: 13.6 seconds (ref 11.4–15.2)

## 2018-07-10 LAB — MAGNESIUM: Magnesium: 2 mg/dL (ref 1.7–2.4)

## 2018-07-10 MED ORDER — HYDRALAZINE HCL 20 MG/ML IJ SOLN
5.0000 mg | Freq: Once | INTRAMUSCULAR | Status: AC
  Administered 2018-07-10: 5 mg via INTRAVENOUS

## 2018-07-10 MED ORDER — MIDAZOLAM HCL 2 MG/2ML IJ SOLN
INTRAMUSCULAR | Status: AC
  Filled 2018-07-10: qty 2

## 2018-07-10 MED ORDER — FENTANYL CITRATE (PF) 100 MCG/2ML IJ SOLN
INTRAMUSCULAR | Status: AC
  Filled 2018-07-10: qty 2

## 2018-07-10 MED ORDER — LIDOCAINE HCL 1 % IJ SOLN
INTRAMUSCULAR | Status: AC | PRN
  Administered 2018-07-10: 5 mL

## 2018-07-10 MED ORDER — MIDAZOLAM HCL 2 MG/2ML IJ SOLN
INTRAMUSCULAR | Status: AC | PRN
  Administered 2018-07-10: 1 mg via INTRAVENOUS

## 2018-07-10 MED ORDER — HYDRALAZINE HCL 20 MG/ML IJ SOLN
INTRAMUSCULAR | Status: AC
  Filled 2018-07-10: qty 1

## 2018-07-10 MED ORDER — IOPAMIDOL (ISOVUE-300) INJECTION 61%
INTRAVENOUS | Status: AC
  Administered 2018-07-10: 15 mL
  Filled 2018-07-10: qty 50

## 2018-07-10 MED ORDER — FENTANYL CITRATE (PF) 100 MCG/2ML IJ SOLN
INTRAMUSCULAR | Status: AC | PRN
  Administered 2018-07-10: 50 ug via INTRAVENOUS

## 2018-07-10 MED ORDER — VANCOMYCIN HCL 10 G IV SOLR
1500.0000 mg | INTRAVENOUS | Status: AC
  Filled 2018-07-10: qty 1500

## 2018-07-10 MED ORDER — LIDOCAINE HCL 1 % IJ SOLN
INTRAMUSCULAR | Status: AC
  Filled 2018-07-10: qty 20

## 2018-07-10 MED ORDER — SODIUM CHLORIDE 0.9% FLUSH
5.0000 mL | Freq: Three times a day (TID) | INTRAVENOUS | Status: DC
  Administered 2018-07-10 – 2018-07-11 (×3): 5 mL

## 2018-07-10 NOTE — Progress Notes (Signed)
[facility] PROGRESS NOTE  Er[place] HCW:[id number] DOB: 1934/04/22 DOA: 07/01/2018  PCP: Seward Carol, MD  Brief History/Interval Summary: 84 year old male with history of A. fib on Eliquis, status post pacemaker in 09/2017, remote prostate cancer, hypertension, hyperlipidemia presented with unresponsiveness.  CT of the head was negative for bleed.  Troponin was mildly elevated.  Cardiology was consulted.  He was started on IV fluids for AKI.  LFTs were mildly elevated.  GI was consulted. He was planned for cardiac catheterization on 07/04/2018. Patient had chills on 07/03/2018 and went into A. fib with RVR.  He was started on empiric antibiotics and had to be placed on Cardizem drip. He also had upper GI bleeding.  He had EGD with EUS on 07/05/2018. HIDA scan was inconclusive.  Subsequent MRCP showed acute calculus cholecystitis with choledocholithiasis.  General surgery was also consulted.  Patient subsequently underwent ERCP which revealed purulent material.  Reason for Visit: Acute cholecystitis with cholangitis  Consultants: Gastroenterology.  General surgery.  Cardiology.  Procedures:   Transthoracic echocardiogram Study Conclusions  - Left ventricle: The cavity size was normal. There was moderate concentric hypertrophy. Systolic function was normal. The estimated ejection fraction was in the range of 55% to 60%. Wall motion was normal; there were no regional wall motion abnormalities. - Aortic valve: There was trivial regurgitation. - Mitral valve: Mildly calcified annulus. There was mild regurgitation. - Left atrium: The atrium was moderately dilated.  EGD and EUS on 07/05/2018 EGD Impression: - No gross lesions in proximal esophagus. - Non-bleeding esophageal ulcers in middle  esophagus. Biopsied. - Salmon-colored mucosa suspicious for  long-segment  Barrett's esophagus and classified as Barrett's  stage C0-M6 per Prague criteria. Biopsied to  confirm diagnosis. - Mallory-Weiss tear that is healing at the distal  GE Junction.. - Z-line irregular, 36 cm from the incisors. - Medium-sized hiatal hernia. - No other gross lesions in the stomach. Biopsied. - Erosive duodenopathy. - Non-bleeding duodenal diverticulum. - Normal major papilla but is located entirely  intradiverticular. - No gross lesions in the third portion of the  duodenum. EUS Impression: - Hyperechoic material consistent with sludge and  debris was visualized endosonographically in the  gallbladder. There is significant obscuring of the  common hepatic duct region as a result of this. - There was dilation in the middle third of the  main bile duct and there was evidence/concern for  hyperechoic material consistent with sludge was  visualized endosonographically in this region,  however, the overt cystic duct insertion and then  the development of the gallbladder obscuring  shadowing defects makes things difficult to see,  but sludge is noted. - Pancreatic parenchymal abnormalities consisting  of atrophy were noted in the genu of the pancreas,    pancreatic body and pancreatic tail. But the duct  itself, had a normal endosonographic appearance in  the pancreatic head, genu of the pancreas, body of  the pancreas and tail of the pancreas. - A cyst was found in the left lobe of the liver  and measured 28 mm by 26 mm. - Ascites was found on endosonographic examination  of the peritoneal cavity.  ERCP 10/29 Impression:               - Choledocholithiasis, partially treated with                            sphincterotomy, balloon sweeping and then placement  of plastic stent.                           - Cyst duct stones evident.                           - Much more purulence than is usual. I believe he                            has cholangitis (hopefully controlled now with                            stent and partial stone removal) and probably also                            has cholecystitis.  Antibiotics: Levaquin  Subjective/Interval History: Patient denies any abdominal pain nausea vomiting.  No chest pain or shortness of breath.  ROS: Denies any headaches  Objective:  Vital Signs  Vitals:   07/09/18 1243 07/09/18 2030 07/10/18 0535 07/10/18 1213  BP: (!) 144/81 131/66 134/62 108/90  Pulse: 62 67 63 69  Resp: 18 18 18 20   Temp: 97.7 F (36.5 C) 97.8 F (36.6 C) 98.4 F (36.9 C) 97.6 F (36.4 C)  TempSrc: Oral Oral Oral Oral  SpO2: 94% 94% 93% 99%  Weight:   101.6 kg   Height:        Intake/Output Summary (Last 24 hours) at 07/10/2018 1310 Last data filed at 07/10/2018 1145 Gross per 24 hour  Intake 515 ml  Output 2725 ml  Net -2210 ml   Filed Weights   07/08/18 0448 07/09/18 0545 07/10/18 0535  Weight: 102.3 kg 101.7 kg 101.6 kg    General appearance: alert,  cooperative, appears stated age and no distress Resp: clear to auscultation bilaterally Cardio: regular rate and rhythm, S1, S2 normal, no murmur, click, rub or gallop GI: soft, non-tender; bowel sounds normal; no masses,  no organomegaly Extremities: extremities normal, atraumatic, no cyanosis or edema Neurologic: No focal neurological deficits  Lab Results:  Data Reviewed: I have personally reviewed following labs and imaging studies  CBC: Recent Labs  Lab 07/06/18 0601 07/07/18 0630 07/08/18 0522 07/09/18 0426 07/10/18 0522  WBC 5.7 6.6 6.9 7.4 7.3  NEUTROABS 4.5 5.1 5.2 5.7 5.4  HGB 9.3* 9.9* 10.4* 10.0* 9.7*  HCT 30.1* 30.9* 32.8* 32.0* 31.1*  MCV 91.2 90.1 89.1 90.9 92.3  PLT 109* 154 198 181 562    Basic Metabolic Panel: Recent Labs  Lab 07/06/18 0601 07/07/18 0630 07/08/18 0522 07/09/18 0426 07/10/18 0522  NA 138 137 138 139 139  K 3.7 3.6 3.5 3.6 4.1  CL 109 109 110 110 110  CO2 21* 20* 22 22 24   GLUCOSE 102* 93 128* 117* 96  BUN 24* 21 17 15 17   CREATININE 1.67* 1.62* 1.61* 1.63* 1.60*  CALCIUM 8.1* 8.0* 8.0* 7.9* 8.1*  MG 2.1 1.9 2.0 2.0 2.0    GFR: Estimated Creatinine Clearance: 42.5 mL/min (A) (by C-G formula based on SCr of 1.6 mg/dL (H)).  Liver Function Tests: Recent Labs  Lab 07/06/18 0601 07/07/18 0630 07/08/18 0522 07/09/18 0426 07/10/18 0522  AST 45* 44* 42* 48* 40  ALT 79* 66* 60* 57* 50*  ALKPHOS 193* 196* 203* 215* 178*  BILITOT 4.2*  5.8* 5.0* 5.0* 3.3*  PROT 5.2* 5.2* 5.3* 5.1* 5.0*  ALBUMIN 2.0* 2.0* 2.0* 2.0* 2.0*    Coagulation Profile: Recent Labs  Lab 07/10/18 0522  INR 1.05    CBG: Recent Labs  Lab 07/09/18 1246 07/09/18 1638 07/09/18 1919 07/10/18 0729 07/10/18 1137  GLUCAP 109* 150* 129* 93 138*     Recent Results (from the past 240 hour(s))  Culture, Urine     Status: Abnormal   Collection Time: 07/02/18  7:37 AM  Result Value Ref Range Status   Specimen Description URINE, RANDOM  Final    Special Requests   Final    NONE Performed at [facility], [place] [address], [place], [place] [address]    Culture (A)  Final    >=100,000 COLONIES/mL KLEBSIELLA PNEUMONIAE 40,000 COLONIES/mL ENTEROCOCCUS FAECALIS    Report Status 07/05/2018 FINAL  Final   Organism ID, Bacteria KLEBSIELLA PNEUMONIAE (A)  Final   Organism ID, Bacteria ENTEROCOCCUS FAECALIS (A)  Final      Susceptibility   Enterococcus faecalis - MIC*    AMPICILLIN <=2 SENSITIVE Sensitive     LEVOFLOXACIN 0.5 SENSITIVE Sensitive     NITROFURANTOIN <=16 SENSITIVE Sensitive     VANCOMYCIN <=0.5 SENSITIVE Sensitive     * 40,000 COLONIES/mL ENTEROCOCCUS FAECALIS   Klebsiella pneumoniae - MIC*    AMPICILLIN >=32 RESISTANT Resistant     CEFAZOLIN <=4 SENSITIVE Sensitive     CEFTRIAXONE <=1 SENSITIVE Sensitive     CIPROFLOXACIN <=0.25 SENSITIVE Sensitive     GENTAMICIN <=1 SENSITIVE Sensitive     IMIPENEM <=0.25 SENSITIVE Sensitive     NITROFURANTOIN 128 RESISTANT Resistant     TRIMETH/SULFA <=20 SENSITIVE Sensitive     AMPICILLIN/SULBACTAM 16 INTERMEDIATE Intermediate     PIP/TAZO 16 SENSITIVE Sensitive     Extended ESBL NEGATIVE Sensitive     * >=100,000 COLONIES/mL KLEBSIELLA PNEUMONIAE  Culture, blood (routine x 2)     Status: None   Collection Time: 07/02/18 10:45 PM  Result Value Ref Range Status   Specimen Description BLOOD LEFT ANTECUBITAL  Final   Special Requests   Final    BOTTLES DRAWN AEROBIC ONLY Blood Culture results may not be optimal due to an inadequate volume of blood received in culture bottles   Culture   Final    NO GROWTH 5 DAYS Performed at [place] [address] [place], [place] [address]    Report Status 07/07/2018 FINAL  Final  Culture, blood (routine x 2)     Status: None   Collection Time: 07/02/18 10:51 PM  Result Value Ref Range Status   Specimen Description BLOOD LEFT FOREARM  Final   Special Requests   Final    BOTTLES DRAWN AEROBIC AND ANAEROBIC Blood Culture  results may not be optimal due to an inadequate volume of blood received in culture bottles   Culture   Final    NO GROWTH 5 DAYS Performed at [facility], [place] [address], [place], [place] [address]    Report Status 07/07/2018 FINAL  Final  Culture, blood (routine x 2)     Status: Abnormal   Collection Time: 07/03/18  4:15 PM  Result Value Ref Range Status   Specimen Description BLOOD RIGHT ANTECUBITAL  Final   Special Requests   Final    BOTTLES DRAWN AEROBIC AND ANAEROBIC Blood Culture adequate volume   Culture  Setup Time   Final    GRAM NEGATIVE RODS IN BOTH  AEROBIC AND ANAEROBIC BOTTLES CRITICAL RESULT CALLED TO, READ BACK BY AND VERIFIED WITH: Zoila Shutter [phone] MLM Performed at [facility], [place] [address] [place], [place] [address]    Culture ESCHERICHIA COLI (A)  Final   Report Status 07/06/2018 FINAL  Final   Organism ID, Bacteria ESCHERICHIA COLI  Final      Susceptibility   Escherichia coli - MIC*    AMPICILLIN 4 SENSITIVE Sensitive     CEFAZOLIN <=4 SENSITIVE Sensitive     CEFEPIME <=1 SENSITIVE Sensitive     CEFTAZIDIME <=1 SENSITIVE Sensitive     CEFTRIAXONE <=1 SENSITIVE Sensitive     CIPROFLOXACIN <=0.25 SENSITIVE Sensitive     GENTAMICIN <=1 SENSITIVE Sensitive     IMIPENEM <=0.25 SENSITIVE Sensitive     TRIMETH/SULFA <=20 SENSITIVE Sensitive     AMPICILLIN/SULBACTAM <=2 SENSITIVE Sensitive     PIP/TAZO <=4 SENSITIVE Sensitive     Extended ESBL NEGATIVE Sensitive     * ESCHERICHIA COLI  Blood Culture ID Panel (Reflexed)     Status: Abnormal   Collection Time: 07/03/18  4:15 PM  Result Value Ref Range Status   Enterococcus species NOT DETECTED NOT DETECTED Final   Listeria monocytogenes NOT DETECTED NOT DETECTED Final   Staphylococcus species NOT DETECTED NOT DETECTED Final   Staphylococcus aureus (BCID) NOT DETECTED NOT DETECTED Final   Streptococcus species NOT DETECTED NOT DETECTED Final   Streptococcus agalactiae NOT DETECTED NOT  DETECTED Final   Streptococcus pneumoniae NOT DETECTED NOT DETECTED Final   Streptococcus pyogenes NOT DETECTED NOT DETECTED Final   Acinetobacter baumannii NOT DETECTED NOT DETECTED Final   Enterobacteriaceae species DETECTED (A) NOT DETECTED Final    Comment: Enterobacteriaceae represent a large family of gram-negative bacteria, not a single organism. CRITICAL RESULT CALLED TO, READ BACK BY AND VERIFIED WITH: PHARMD V BRYK [phone] MLM    Enterobacter cloacae complex NOT DETECTED NOT DETECTED Final   Escherichia coli DETECTED (A) NOT DETECTED Final    Comment: CRITICAL RESULT CALLED TO, READ BACK BY AND VERIFIED WITH: PHARMD V BRYK [phone] MLM    Klebsiella oxytoca NOT DETECTED NOT DETECTED Final   Klebsiella pneumoniae NOT DETECTED NOT DETECTED Final   Proteus species NOT DETECTED NOT DETECTED Final   Serratia marcescens NOT DETECTED NOT DETECTED Final   Carbapenem resistance NOT DETECTED NOT DETECTED Final   Haemophilus influenzae NOT DETECTED NOT DETECTED Final   Neisseria meningitidis NOT DETECTED NOT DETECTED Final   Pseudomonas aeruginosa NOT DETECTED NOT DETECTED Final   Candida albicans NOT DETECTED NOT DETECTED Final   Candida glabrata NOT DETECTED NOT DETECTED Final   Candida krusei NOT DETECTED NOT DETECTED Final   Candida parapsilosis NOT DETECTED NOT DETECTED Final   Candida tropicalis NOT DETECTED NOT DETECTED Final    Comment: Performed at [facility], [place] [address] [place], [place] [address]  Culture, blood (routine x 2)     Status: Abnormal   Collection Time: 07/03/18  4:30 PM  Result Value Ref Range Status   Specimen Description BLOOD LEFT HAND  Final   Special Requests   Final    BOTTLES DRAWN AEROBIC AND ANAEROBIC Blood Culture adequate volume   Culture  Setup Time   Final    GRAM NEGATIVE RODS IN BOTH AEROBIC AND ANAEROBIC BOTTLES CRITICAL VALUE NOTED.  VALUE IS CONSISTENT WITH PREVIOUSLY REPORTED AND CALLED VALUE.    Culture (A)  Final     ESCHERICHIA COLI SUSCEPTIBILITIES  PERFORMED ON PREVIOUS CULTURE WITHIN THE LAST 5 DAYS. Performed at [facility], [place] [address] [place], [place] [address]    Report Status 07/06/2018 FINAL  Final  Culture, Urine     Status: Abnormal   Collection Time: 07/03/18  5:08 PM  Result Value Ref Range Status   Specimen Description URINE, CATHETERIZED  Final   Special Requests   Final    NONE Performed at [facility], [place] [address] [place], [place] [address]    Culture (A)  Final    >=100,000 COLONIES/mL KLEBSIELLA PNEUMONIAE >=100,000 COLONIES/mL ENTEROCOCCUS FAECALIS    Report Status 07/07/2018 FINAL  Final   Organism ID, Bacteria KLEBSIELLA PNEUMONIAE (A)  Final   Organism ID, Bacteria ENTEROCOCCUS FAECALIS (A)  Final      Susceptibility   Enterococcus faecalis - MIC*    AMPICILLIN <=2 SENSITIVE Sensitive     LEVOFLOXACIN 1 SENSITIVE Sensitive     NITROFURANTOIN <=16 SENSITIVE Sensitive     VANCOMYCIN 1 SENSITIVE Sensitive     * >=100,000 COLONIES/mL ENTEROCOCCUS FAECALIS   Klebsiella pneumoniae - MIC*    AMPICILLIN >=32 RESISTANT Resistant     CEFAZOLIN <=4 SENSITIVE Sensitive     CEFTRIAXONE <=1 SENSITIVE Sensitive     CIPROFLOXACIN <=0.25 SENSITIVE Sensitive     GENTAMICIN <=1 SENSITIVE Sensitive     IMIPENEM <=0.25 SENSITIVE Sensitive     NITROFURANTOIN 64 INTERMEDIATE Intermediate     TRIMETH/SULFA <=20 SENSITIVE Sensitive     AMPICILLIN/SULBACTAM 16 INTERMEDIATE Intermediate     PIP/TAZO 16 SENSITIVE Sensitive     Extended ESBL NEGATIVE Sensitive     * >=100,000 COLONIES/mL KLEBSIELLA PNEUMONIAE  Culture, blood (routine x 2)     Status: Abnormal   Collection Time: 07/06/18  8:41 AM  Result Value Ref Range Status   Specimen Description BLOOD RIGHT ARM  Final   Special Requests   Final    BOTTLES DRAWN AEROBIC AND ANAEROBIC Blood Culture adequate volume   Culture  Setup Time   Final    GRAM POSITIVE COCCI IN BOTH AEROBIC AND ANAEROBIC BOTTLES CRITICAL  RESULT CALLED TO, READ BACK BY AND VERIFIED WITH: PHARMD B MANCHERIL [phone] MLM    Culture (A)  Final    STAPHYLOCOCCUS SPECIES (COAGULASE NEGATIVE) THE SIGNIFICANCE OF ISOLATING THIS ORGANISM FROM A SINGLE SET OF BLOOD CULTURES WHEN MULTIPLE SETS ARE DRAWN IS UNCERTAIN. PLEASE NOTIFY THE MICROBIOLOGY DEPARTMENT WITHIN ONE WEEK IF SPECIATION AND SENSITIVITIES ARE REQUIRED. Performed at [facility], [place] [address], [place], [place] [address]    Report Status 07/09/2018 FINAL  Final  Blood Culture ID Panel (Reflexed)     Status: Abnormal   Collection Time: 07/06/18  8:41 AM  Result Value Ref Range Status   Enterococcus species NOT DETECTED NOT DETECTED Final   Listeria monocytogenes NOT DETECTED NOT DETECTED Final   Staphylococcus species DETECTED (A) NOT DETECTED Final    Comment: Methicillin (oxacillin) resistant coagulase negative staphylococcus. Possible blood culture contaminant (unless isolated from more than one blood culture draw or clinical case suggests pathogenicity). No antibiotic treatment is indicated for blood  culture contaminants. CRITICAL RESULT CALLED TO, READ BACK BY AND VERIFIED WITH: PHARMD B MANCHERIL [phone] MLM    Staphylococcus aureus (BCID) NOT DETECTED NOT DETECTED Final   Methicillin resistance DETECTED (A) NOT DETECTED Final    Comment: CRITICAL RESULT CALLED TO, READ BACK BY AND VERIFIED WITH: PHARMD B MANCHERIL [phone] MLM    Streptococcus  species NOT DETECTED NOT DETECTED Final   Streptococcus agalactiae NOT DETECTED NOT DETECTED Final   Streptococcus pneumoniae NOT DETECTED NOT DETECTED Final   Streptococcus pyogenes NOT DETECTED NOT DETECTED Final   Acinetobacter baumannii NOT DETECTED NOT DETECTED Final   Enterobacteriaceae species NOT DETECTED NOT DETECTED Final   Enterobacter cloacae complex NOT DETECTED NOT DETECTED Final   Escherichia coli NOT DETECTED NOT DETECTED Final   Klebsiella oxytoca NOT DETECTED NOT DETECTED Final    Klebsiella pneumoniae NOT DETECTED NOT DETECTED Final   Proteus species NOT DETECTED NOT DETECTED Final   Serratia marcescens NOT DETECTED NOT DETECTED Final   Haemophilus influenzae NOT DETECTED NOT DETECTED Final   Neisseria meningitidis NOT DETECTED NOT DETECTED Final   Pseudomonas aeruginosa NOT DETECTED NOT DETECTED Final   Candida albicans NOT DETECTED NOT DETECTED Final   Candida glabrata NOT DETECTED NOT DETECTED Final   Candida krusei NOT DETECTED NOT DETECTED Final   Candida parapsilosis NOT DETECTED NOT DETECTED Final   Candida tropicalis NOT DETECTED NOT DETECTED Final    Comment: Performed at [facility], [place] [address], [place], [place] [address]  Culture, blood (routine x 2)     Status: None (Preliminary result)   Collection Time: 07/06/18  8:46 AM  Result Value Ref Range Status   Specimen Description BLOOD RIGHT HAND  Final   Special Requests   Final    BOTTLES DRAWN AEROBIC ONLY Blood Culture adequate volume   Culture   Final    NO GROWTH 4 DAYS Performed at [facility], [place] [address] [place], [place] [address]    Report Status PENDING  Incomplete      Radiology Studies: Dg Ercp Biliary & Pancreatic Ducts  Result Date: 07/09/2018 CLINICAL DATA:  Common bile duct stone EXAM: ERCP TECHNIQUE: Multiple spot images obtained with the fluoroscopic device and submitted for interpretation post-procedure. FLUOROSCOPY TIME:  Fluoroscopy Time: Reported as 8 minutes, 24 seconds COMPARISON:  07/07/2018 MRI FINDINGS: Endoscopy and ERCP or performed by Dr. Oretha Caprice. I was called up to endoscopy and reviewed the images with him while the procedure was underway. There is prominent intrahepatic biliary dilatation along with filling defects in the common hepatic duct compatible with stones. We also fill a significant portion of the cystic duct which is also clearly filled with large stones. The cystic duct stones are thought to likely be causing extrinsic mass effect  (Mirizzi syndrome) on the common hepatic duct and common bile duct, which prevents balloon extraction of the common hepatic duct stones. Despite attempts. The final image demonstrates the biliary stent that Dr. Ardis Hughs placed traversing the obstruction. IMPRESSION: 1. 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 The final image demonstrates a stent extending from the dilated intrahepatic biliary system across the stenosis caused by the stones and extending towards the duodenum. These images were submitted for radiologic interpretation only. Please see the procedural report for the amount of contrast and the fluoroscopy time utilized. Electronically Signed   By: Van Clines M.D.   On: 07/09/2018 11:33     Medications:  Scheduled: . diltiazem  120 mg Oral Daily  . docusate sodium  100 mg Oral BID  . insulin aspart  0-9 Units Subcutaneous TID WC  .  metoprolol succinate  25 mg Oral BID  . pantoprazole  40 mg Oral BID  . sodium chloride flush  3 mL Intravenous Q12H  . tamsulosin  0.4 mg Oral BID   Continuous: . sodium chloride Stopped (07/09/18 1357)  . diltiazem (CARDIZEM) infusion Stopped (07/04/18 0945)  . levofloxacin (LEVAQUIN) IV 500 mg (07/10/18 1232)  . vancomycin     LNL:GXQJJH chloride, acetaminophen **OR** acetaminophen, metoprolol tartrate, morphine injection, ondansetron **OR** ondansetron (ZOFRAN) IV  Assessment/Plan:    Acute cholecystitis with choledocholithiasis Patient seen by gastroenterology and surgery.  MRCP done on 10/27 was suggestive of cholecystitis with choledocholithiasis.  Patient underwent ERCP on 10/29 with removal of stone.  Purulent material was noted.  Patient will need to undergo percutaneous cholecystostomy drain  placement by interventional radiology.  And then patient will eventually need to undergo an elective laparoscopic cholecystectomy after patient has been cleared by cardiology.  Patient is asymptomatic currently.  On IV Levaquin.  E. coli bacteremia Most likely from biliary source.  On Levaquin.  Blood culture from 10/26 was positive for coag negative staph thought to be a contaminant.  Patient remains afebrile.  Urinary tract infection in the setting of chronic indwelling Foley Unclear if this is a 2 infection or colonization.  Continue with antibiotics as outlined above.  Foley catheter was changed on 10/23.  Outpatient follow-up with urology.  Syncope with elevated troponin Etiology unclear.  Echocardiogram shows normal systolic function.  Could be related to the orthostatic hypotension.  Lasix on hold.  Off of IV fluids.  Cardiology had planned for cardiac catheterization but this had to be postponed due to bacteremia.  This can be addressed in the outpatient setting.  Patient denies any chest pain.  Paroxysmal atrial fibrillation/flutter with rapid ventricular response Patient also has a pacemaker as a result of bradycardia earlier this year.  Was transiently on Cardizem infusion.  Currently rate controlled.  Patient is on oral Cardizem and beta-blocker.  Was on heparin infusion.  This was discontinued due to GI bleed.  Not on anticoagulation currently.  Was on Eliquis at home.  Probable upper GI bleed Patient status post EGD with EUS which showed esophageal ulcers Barrett's esophagus and healing Mallory-Weiss tear.  Patient on PPI.  Will need to know from GI as to the timing of resumption of anticoagulation.  Acute kidney injury on chronic kidney disease stage III Creatinine is stable at baseline now.  Monitor urine output.  Chronic urinary retention with chronic indwelling Foley catheter Outpatient follow-up with urology.  Foley catheter changed on 10/23.  Thrombocytopenia Unclear  etiology.  Now resolved.  DVT Prophylaxis: SCDs.    Code Status: DNR Family Communication: No family at bedside Disposition Plan: Management as outlined above.    LOS: 8 days   [facility] Pager [phone] 07/10/2018, 1:10 PM  If 7PM-7AM, please contact night-coverage at [email or web address], password [facility]

## 2018-07-10 NOTE — Consult Note (Signed)
Chief Complaint: Patient was seen in consultation today for percutaneous cholecystostomy drain placement Chief Complaint  Patient presents with  . Loss of Consciousness   at the request of Dr Brantley Stage   Supervising Physician: Marybelle Killings  Patient Status: [facility] - In-pt  History of Present Illness: Ernest Parker is a 84 y.o. male   Choledocholithiasis/cholycystits Bacteremia UTI  Note after ERCP:  Patient has significant stone burden in CBD up into hepatic duct and in cystic duct.  1/3-1/2 of the stones were removed and a stent was placed.  However, he encountered copious amounts of purulent drainage.  Given his bacteremia, cholangitis, and overall diagnostic findings, despite no pain, we will plan to proceed with a perc chole drain.  He will still need cardiac work up as an outpatient for surgical clearance down the road.  Also given the duration of his situation, he is at significant increase risk of surgical complications.  Therefore, we feel at this time a perc chole is the patient's safest option.  Request for percutaneous cholecystostomy drain placement Dr Barbie Banner has reviewed imaging and approves procedure   Past Medical History:  Diagnosis Date  . A-fib ([place])   . Dizzy   . Dyspnea   . GERD (gastroesophageal reflux disease)   . Hard of hearing    wears hearing aids bilat  . High cholesterol   . Hypertension   . Presence of permanent cardiac pacemaker 12/17/2017  . Prostate cancer ([place]) 2002   S/P "radiation for 8-9 weeks"  . SOB (shortness of breath)     Past Surgical History:  Procedure Laterality Date  . BILIARY STENT PLACEMENT  07/09/2018   Procedure: BILIARY STENT PLACEMENT;  Surgeon: Milus Banister, MD;  Location: [facility] ENDOSCOPY;  Service: Endoscopy;;  . BIOPSY  07/05/2018   Procedure: BIOPSY;  Surgeon: Irving Copas., MD;  Location: [facility] ENDOSCOPY;  Service: Gastroenterology;;  . ERCP N/A 07/09/2018   Procedure: ENDOSCOPIC RETROGRADE  CHOLANGIOPANCREATOGRAPHY (ERCP);  Surgeon: Milus Banister, MD;  Location: [facility] ENDOSCOPY;  Service: Endoscopy;  Laterality: N/A;  . ESOPHAGOGASTRODUODENOSCOPY (EGD) WITH PROPOFOL N/A 07/05/2018   Procedure: ESOPHAGOGASTRODUODENOSCOPY (EGD) WITH PROPOFOL;  Surgeon: Rush Landmark Telford Nab., MD;  Location: [place];  Service: Gastroenterology;  Laterality: N/A;  . EUS  07/05/2018   Procedure: FULL UPPER ENDOSCOPIC ULTRASOUND (EUS) RADIAL;  Surgeon: Rush Landmark Telford Nab., MD;  Location: [place];  Service: Gastroenterology;;  . INGUINAL HERNIA REPAIR Bilateral   . INSERT / REPLACE / REMOVE PACEMAKER  12/17/2017  . PACEMAKER IMPLANT N/A 12/17/2017   Procedure: PACEMAKER IMPLANT;  Surgeon: Evans Lance, MD;  Location: [facility] CV LAB;  Service: Cardiovascular;  Laterality: N/A;  . PROSTATE BIOPSY    . REMOVAL OF STONES  07/09/2018   Procedure: REMOVAL OF STONES;  Surgeon: Milus Banister, MD;  Location: [facility] ENDOSCOPY;  Service: Endoscopy;;  . Ernest Parker  07/09/2018   Procedure: Ernest Parker;  Surgeon: Milus Banister, MD;  Location: [facility] ENDOSCOPY;  Service: Endoscopy;;  . THORACIC AORTIC ANEURYSM REPAIR      Allergies: Penicillins  Medications: Prior to Admission medications   Medication Sig Start Date End Date Taking? Authorizing Provider  apixaban (ELIQUIS) 2.5 MG TABS tablet Take 1 tablet (2.5 mg total) by mouth 2 (two) times daily. 10/23/17  Yes Evans Lance, MD  Cholecalciferol (VITAMIN D3) 2000 units TABS Take 2,000 Units by mouth daily.    Yes [provider]  diltiazem (CARDIZEM CD) 180 MG 24 hr capsule Take 1 capsule (  180 mg total) by mouth daily. 04/30/18  Yes Evans Lance, MD  furosemide (LASIX) 40 MG tablet TAKE 1 TABLET BY MOUTH EVERY DAY Patient taking differently: Take 40 mg by mouth daily.  05/27/18  Yes Evans Lance, MD  metoprolol succinate (TOPROL-XL) 100 MG 24 hr tablet TAKE 1 TABLET TWICE DAILY IMMEDIATELY FOLLOWING MEAL Patient taking  differently: Take 100 mg by mouth 2 (two) times daily.  05/07/18  Yes Evans Lance, MD  Red Yeast Rice 600 MG CAPS Take 600 mg by mouth daily.    Yes [provider]  tamsulosin (FLOMAX) 0.4 MG CAPS capsule Take 1 capsule (0.4 mg total) by mouth daily after supper. Patient taking differently: Take 0.4 mg by mouth 2 (two) times daily.  03/15/18  Yes Evans Lance, MD     Family History  Problem Relation Age of Onset  . Hypertension Mother   . Tuberculosis Father   . Kidney disease Sister   . Sleep apnea Sister     Social History   Socioeconomic History  . Marital status: Widowed    Spouse name: Not on file  . Number of children: Not on file  . Years of education: Not on file  . Highest education level: Not on file  Occupational History  . Occupation: RETIRED  Social Needs  . Financial resource strain: Not on file  . Food insecurity:    Worry: Not on file    Inability: Not on file  . Transportation needs:    Medical: Not on file    Non-medical: Not on file  Tobacco Use  . Smoking status: Former Smoker    Packs/day: 0.50    Years: 30.00    Pack years: 15.00    Types: Cigarettes    Last attempt to quit: 1980    Years since quitting: 39.8  . Smokeless tobacco: Never Used  Substance and Sexual Activity  . Alcohol use: Yes    Alcohol/week: 1.0 standard drinks    Types: 1 Cans of beer per week    Comment: rare  . Drug use: No  . Sexual activity: Not on file  Lifestyle  . Physical activity:    Days per week: Not on file    Minutes per session: Not on file  . Stress: Not on file  Relationships  . Social connections:    Talks on phone: Not on file    Gets together: Not on file    Attends religious service: Not on file    Active member of club or organization: Not on file    Attends meetings of clubs or organizations: Not on file    Relationship status: Not on file  Other Topics Concern  . Not on file  Social History Narrative   Retired Freight forwarder for  [employer], Engineer, agricultural. Wife passed away over 5 years ago. Lives alone during the summer in [place] and lives with daughter during the Winter in [place], moving to [place]. Has 1 daughter and 2 sons. One sone lives in [place] and one in [place].   Is an avid golfer, plays 4-5 days per week. Likes to do yardwork.      Review of Systems: A 12 point ROS discussed and pertinent positives are indicated in the HPI above.  All other systems are negative.  Review of Systems  Constitutional: Positive for activity change and appetite change. Negative for fatigue and fever.  Respiratory: Negative for shortness of breath.   Cardiovascular: Negative for chest pain.  Gastrointestinal: Positive for abdominal pain and nausea.  Psychiatric/Behavioral: Negative for behavioral problems and confusion.    Vital Signs: BP 134/62   Pulse 63   Temp 98.4 F (36.9 C) (Oral)   Resp 18   Ht 5\' 10"  (1.778 m)   Wt 223 lb 14.4 oz (101.6 kg)   SpO2 93%   BMI 32.13 kg/m   Physical Exam  Constitutional: He is oriented to person, place, and time.  Cardiovascular: Normal rate and regular rhythm.  Pulmonary/Chest: Effort normal and breath sounds normal.  Abdominal: Soft. There is tenderness.  Musculoskeletal: Normal range of motion.  Neurological: He is alert and oriented to person, place, and time.  Skin: Skin is warm and dry.  Psychiatric: He has a normal mood and affect. His behavior is normal. Judgment and thought content normal.  Vitals reviewed.   Imaging: Dg Chest 2 View  Result Date: 07/01/2018 CLINICAL DATA:  Shortness of breath EXAM: CHEST - 2 VIEW COMPARISON:  December 18, 2017 FINDINGS: There Is no appreciable edema or consolidation. There is mild atelectatic change in the left base. There is no consolidation. Heart is mildly enlarged with pulmonary vascularity normal. Pacemaker leads are attached to the right atrium and right ventricle. No evident adenopathy. There is degenerative change in the thoracic  spine. There is a stent in the abdominal aortic region, incompletely visualized. There is aortic atherosclerosis. IMPRESSION: Mild left base atelectasis. No edema or consolidation. Heart mildly enlarged with pacemaker leads as described. There is aortic atherosclerosis. Aortic Atherosclerosis (ICD10-I70.0). Electronically Signed   By: Lowella Grip III M.D.   On: 07/01/2018 07:21   Dg Abd 1 View  Result Date: 07/03/2018 CLINICAL DATA:  84 year old male with vomiting and diarrhea. EXAM: ABDOMEN - 1 VIEW COMPARISON:  Chest radiographs 07/01/2018. FINDINGS: Portable AP supine view at 1850 hours. Bifurcated abdominal aortic endograft. Visualized bowel gas pattern is non obstructed. No pneumoperitoneum identified on this supine view. Small surgical clips and mesh type fast nerves project in the pelvis. No acute osseous abnormality identified. IMPRESSION: Normal visible bowel gas pattern. Abdominal aortic endograft in place. Electronically Signed   By: Genevie Ann M.D.   On: 07/03/2018 19:05   Ct Head Wo Contrast  Result Date: 07/01/2018 CLINICAL DATA:  Recent fall with left-sided headaches, initial encounter EXAM: CT HEAD WITHOUT CONTRAST TECHNIQUE: Contiguous axial images were obtained from the base of the skull through the vertex without intravenous contrast. COMPARISON:  10/15/2017 FINDINGS: Brain: Lacunar infarct is again noted in the left basal ganglia. No findings to suggest acute hemorrhage, acute infarction or space-occupying mass lesion are noted. Mild atrophic changes are noted and stable. Vascular: No hyperdense vessel or unexpected calcification. Skull: Normal. Negative for fracture or focal lesion. Sinuses/Orbits: No acute finding. Other: None. 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 No acute intracranial abnormality is noted. Electronically Signed   By: Inez Catalina M.D.   On: 07/01/2018 08:23   Nm Hepatobiliary Liver Func  Result Date: 07/06/2018 CLINICAL DATA:  Abdominal pain  with nausea and vomiting on off since 06/28/2018. Rising bilirubin. Abnormal liver function test. EXAM: NUCLEAR MEDICINE HEPATOBILIARY IMAGING TECHNIQUE: Sequential images of the abdomen were obtained out to 60 minutes following intravenous administration of radiopharmaceutical. RADIOPHARMACEUTICALS:  6.5 mCi Tc-24m  Choletec IV COMPARISON:  Ultrasound, 07/01/2018. FINDINGS: There is prompt and homogeneous accumulation of radiotracer by the liver. Despite imaging over the course of 120 minutes, no radiotracer activity was noted within the common bile duct, gallbladder or small bowel. IMPRESSION:  1. Nonvisualization of the common bile duct, gallbladder and small bowel. This is likely due to hepatocellular dysfunction with depressed biliary excretion. Central biliary obstruction is also possible. Electronically Signed   By: Lajean Manes M.D.   On: 07/06/2018 14:13   US Abdomen Complete  Addendum Date: 07/03/2018   ADDENDUM REPORT: 07/03/2018 12:11 ADDENDUM: On further review, the gallbladder contents are markedly heterogeneous with possible soft tissue component versus tumefactive sludge, and echogenic shadowing areas of gas versus calcification. If gallbladder carcinoma or emphysematous cholecystitis are clinical concerns, consider CT abdomen with contrast for further evaluation. Electronically Signed   By: Lucrezia Europe M.D.   On: 07/03/2018 12:11   Result Date: 07/03/2018 CLINICAL DATA:  Elevated LFTs EXAM: COMPLETE ABDOMINAL ULTRASOUND COMPARISON:  None available FINDINGS: Gallbladder: Intraluminal sludge and multiple calculi measuring up to 1.1 cm in the gallbladder. Normal wall thickness 2.4 mm. No pericholecystic fluid. Sonographer describes no sonographic Murphy's sign. Common bile duct:  Normal in caliber for age, 7.38mm diameter. Liver: Homogeneous in echotexture. Mildly complex 3.3 cm cyst in the left lobe. No intrahepatic biliary ductal dilatation. Antegrade color Doppler flow signal in the main  portal vein. IVC:  Negative Pancreas: Visualized segments unremarkable, portions obscured by overlying bowel gas. Spleen:  No focal lesion, craniocaudal 7.1cm in length. Right Kidney:  No mass or hydronephrosis, 12.2cm in length. Left Kidney:  No lesion or hydronephrosis, 12.2cm in length. Abdominal aorta: Native aneurysm sac 9.7 cm transverse diameter, with apparent intraluminal stent graft partially seen. IMPRESSION: 1. Cholelithiasis without other ultrasound evidence of cholecystitis or biliary obstruction. 2. 3.3 cm complex hepatic cyst, left lobe. 3. 9.7 cm native abdominal aortic aneurysm diameter, with apparent intraluminal stent graft, incompletely evaluated. Electronically Signed: By: Lucrezia Europe M.D. On: 07/01/2018 12:59   Mr Abdomen Mrcp Wo Contrast  Result Date: 07/07/2018 CLINICAL DATA:  Jaundice, abdominal pain, fever EXAM: MRI ABDOMEN WITHOUT CONTRAST  (INCLUDING MRCP) TECHNIQUE: Multiplanar multisequence MR imaging of the abdomen was performed. Heavily T2-weighted images of the biliary and pancreatic ducts were obtained, and three-dimensional MRCP images were rendered by post processing. COMPARISON:  Right upper quadrant ultrasound dated 07/01/2018 FINDINGS: Motion degraded images. Lower chest: Small bilateral pleural effusions. Hepatobiliary: No morphologic findings of cirrhosis. 2.9 cm cyst in segment 4B (series 4/image 13). Mild intrahepatic and extrahepatic ductal dilatation. Multiple common duct stones, measuring up to 12 mm in the central common duct (series 13/image 6). Distended, irregular gallbladder with numerous layering gallstones and mild gallbladder wall thickening. Given the associated findings and irregular appearance of the gallbladder wall, this appearance is worrisome for acute cholecystitis. Pancreas:  Within normal limits. Spleen:  Within normal limits. Adrenals/Urinary Tract:  Adrenal glands are within normal limits. Kidneys are within normal limits.  No hydronephrosis.  Stomach/Bowel: Stomach is notable for a tiny hiatal hernia. Duodenal diverticulum (series 4/image 25) along the posterior aspect of the 2nd/3rd portion of the duodenum. Visualized bowel is otherwise grossly unremarkable. Vascular/Lymphatic: 7.6 x 9.6 cm infrarenal abdominal aortic aneurysm (series 4/image 33), incompletely visualized. Suspected indwelling aorto bi-iliac stent. No suspicious abdominal lymphadenopathy. Other:  No abdominal ascites. Musculoskeletal: No focal osseous lesions. IMPRESSION: Distended, irregular gallbladder with cholelithiasis and gallbladder wall thickening, favoring acute cholecystitis. Associated intrahepatic and extrahepatic dilatation with multiple common duct stones measuring up to 12 mm. 7.6 x 9.6 cm infrarenal abdominal aortic aneurysm, incompletely visualized. Suspected aorto bi-iliac stents. Electronically Signed   By: Julian Hy M.D.   On: 07/07/2018 15:22   Mr 3d Recon At  Scanner  Result Date: 07/07/2018 CLINICAL DATA:  Jaundice, abdominal pain, fever EXAM: MRI ABDOMEN WITHOUT CONTRAST  (INCLUDING MRCP) TECHNIQUE: Multiplanar multisequence MR imaging of the abdomen was performed. Heavily T2-weighted images of the biliary and pancreatic ducts were obtained, and three-dimensional MRCP images were rendered by post processing. COMPARISON:  Right upper quadrant ultrasound dated 07/01/2018 FINDINGS: Motion degraded images. Lower chest: Small bilateral pleural effusions. Hepatobiliary: No morphologic findings of cirrhosis. 2.9 cm cyst in segment 4B (series 4/image 13). Mild intrahepatic and extrahepatic ductal dilatation. Multiple common duct stones, measuring up to 12 mm in the central common duct (series 13/image 6). Distended, irregular gallbladder with numerous layering gallstones and mild gallbladder wall thickening. Given the associated findings and irregular appearance of the gallbladder wall, this appearance is worrisome for acute cholecystitis. Pancreas:  Within  normal limits. Spleen:  Within normal limits. Adrenals/Urinary Tract:  Adrenal glands are within normal limits. Kidneys are within normal limits.  No hydronephrosis. Stomach/Bowel: Stomach is notable for a tiny hiatal hernia. Duodenal diverticulum (series 4/image 25) along the posterior aspect of the 2nd/3rd portion of the duodenum. Visualized bowel is otherwise grossly unremarkable. Vascular/Lymphatic: 7.6 x 9.6 cm infrarenal abdominal aortic aneurysm (series 4/image 33), incompletely visualized. Suspected indwelling aorto bi-iliac stent. No suspicious abdominal lymphadenopathy. Other:  No abdominal ascites. Musculoskeletal: No focal osseous lesions. IMPRESSION: Distended, irregular gallbladder with cholelithiasis and gallbladder wall thickening, favoring acute cholecystitis. Associated intrahepatic and extrahepatic dilatation with multiple common duct stones measuring up to 12 mm. 7.6 x 9.6 cm infrarenal abdominal aortic aneurysm, incompletely visualized. Suspected aorto bi-iliac stents. Electronically Signed   By: Julian Hy M.D.   On: 07/07/2018 15:22   Dg Ercp Biliary & Pancreatic Ducts  Result Date: 07/09/2018 CLINICAL DATA:  Common bile duct stone EXAM: ERCP TECHNIQUE: Multiple spot images obtained with the fluoroscopic device and submitted for interpretation post-procedure. FLUOROSCOPY TIME:  Fluoroscopy Time: Reported as 8 minutes, 24 seconds COMPARISON:  07/07/2018 MRI FINDINGS: Endoscopy and ERCP or performed by Dr. Oretha Caprice. I was called up to endoscopy and reviewed the images with him while the procedure was underway. There is prominent intrahepatic biliary dilatation along with filling defects in the common hepatic duct compatible with stones. We also fill a significant portion of the cystic duct which is also clearly filled with large stones. The cystic duct stones are thought to likely be causing extrinsic mass effect (Mirizzi syndrome) on the common hepatic duct and common bile duct,  which prevents balloon extraction of the common hepatic duct stones. Despite attempts. The final image demonstrates the biliary stent that Dr. Ardis Hughs placed traversing the obstruction. IMPRESSION: 1. 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 The final image demonstrates a stent extending from the dilated intrahepatic biliary system across the stenosis caused by the stones and extending towards the duodenum. These images were submitted for radiologic interpretation only. Please see the procedural report for the amount of contrast and the fluoroscopy time utilized. Electronically Signed   By: Van Clines M.D.   On: 07/09/2018 11:33   US Liver Doppler  Result Date: 07/03/2018 CLINICAL DATA:  Elevated LFTs. History of syncope and elevated troponins. EXAM: DUPLEX ULTRASOUND OF LIVER TECHNIQUE: Color and duplex Doppler ultrasound was performed to evaluate the hepatic in-flow and out-flow vessels.  COMPARISON:  Abdominal ultrasound 07/01/2018 FINDINGS: Liver: Liver contour adjacent to the right kidney is slightly nodular. Again noted is a mildly complex hepatic cyst measuring roughly 3.1 cm. Liver parenchyma is mildly heterogeneous. Portal Vein Velocities Main:  41 cm/sec Right:  25 cm/sec Left:  22 cm/sec Hepatic Vein Velocities Right:  47 cm/sec Middle:  47 cm/sec Left:  44 cm/sec IVC: Present and patent with normal respiratory phasicity. Hepatic Artery Velocity:  140 cm/sec Splenic Vein Velocity:  46 cm/sec Varices: Absent Ascites: Absent Again noted is an abnormal gallbladder. Gallbladder has echogenic foci with areas of shadowing. Although the findings could be related to irregular gallstones, the appearance raises concern for underlying inflammation or neoplasm.  Portal vein demonstrates normal hepatopetal flow towards the liver. Main portal vein measures roughly 1.2 cm. Spleen size is upper limits of normal with a length of 10.4 cm. Normal hepatofugal flow in the hepatic veins. Splenic vein is patent. Probable splenule near the splenic hilum measuring up to 2.4 cm. IMPRESSION: 1. Portal venous system is patent with normal direction of flow. 2. Abnormal gallbladder. Suspect there are stones or calcifications but recommend further characterization with a post contrast CT to further characterize the gallbladder for an inflammatory or neoplastic process. 3. Nodular contour of the liver and findings could be related to cirrhosis. This finding could also be further characterized with cross-sectional imaging. Spleen size is upper limits of normal. These results will be called to the ordering clinician or representative by the Radiologist Assistant, and communication documented in the PACS or zVision Dashboard. Electronically Signed   By: [name] Daft M.D.   On: 07/03/2018 12:26    Labs:  CBC: Recent Labs    07/07/18 0630 07/08/18 0522 07/09/18 0426 07/10/18 0522  WBC 6.6 6.9 7.4 7.3  HGB 9.9* 10.4* 10.0* 9.7*  HCT 30.9* 32.8* 32.0* 31.1*  PLT 154 198 181 199    COAGS: Recent Labs    10/15/17 1419 07/01/18 1647 07/02/18 0050 07/03/18 0548 07/03/18 1528 07/10/18 0522  INR 1.17 1.13 1.43 1.11  --  1.05  APTT 35  --   --  44* 54*  --     BMP: Recent Labs    07/07/18 0630 07/08/18 0522 07/09/18 0426 07/10/18 0522  NA 137 138 139 139  K 3.6 3.5 3.6 4.1  CL 109 110 110 110  CO2 20* 22 22 24   GLUCOSE 93 128* 117* 96  BUN 21 17 15 17   CALCIUM 8.0* 8.0* 7.9* 8.1*  CREATININE 1.62* 1.61* 1.63* 1.60*  GFRNONAA 38* 38* 38* 38*  GFRAA 44* 44* 44* 45*    LIVER FUNCTION TESTS: Recent Labs    07/07/18 0630 07/08/18 0522 07/09/18 0426 07/10/18 0522  BILITOT 5.8* 5.0* 5.0* 3.3*  AST 44* 42* 48* 40  ALT 66* 60* 57* 50*  ALKPHOS 196* 203* 215*  178*  PROT 5.2* 5.3* 5.1* 5.0*  ALBUMIN 2.0* 2.0* 2.0* 2.0*    TUMOR MARKERS: No results for input(s): AFPTM, CEA, CA199, CHROMGRNA in the last 8760 hours.  Assessment and Plan:  Choledolithiasis; cholecystitis Purulent OP observed in ERCP while placing stent Bacteremia Not surgical canndidate for OR cholecystectomy Scheduled for percutaneous cholecystostomy in IR Risks and benefits discussed with the patient including, but not limited to bleeding, infection, gallbladder perforation, bile leak, sepsis or even death.  All of the patient's questions were answered, patient is agreeable to proceed. Consent signed and in chart.   Thank you for this interesting consult.  I greatly enjoyed  meeting [name] and look forward to participating in their care.  A copy of this report was sent to the requesting provider on this date.  Electronically Signed: Lavonia Drafts, PA-C 07/10/2018, 9:33 AM   I spent a total of 40 Minutes    in face to face in clinical consultation, greater than 50% of which was counseling/coordinating care for perc chole

## 2018-07-10 NOTE — Progress Notes (Signed)
Patient returned from IR awake and oriented. No c/o discomfort. V/S being obtained as ordered.  Drainage bag in place to right upper side. Content of bag is puralent tan drainage.  Pt taking Liquid well and tolerating.  Will continue to observe.

## 2018-07-10 NOTE — Progress Notes (Signed)
[name]  CC:  Biliary obstruction from stones  Subjective:  Feels good.  Wants to go home.  ERCP on 10/29 showed the following:  Choledocholithiasis, partially treated with sphincterotomy, balloon sweeping and then placement of plastic stent. - Cyst duct stones evident. - Much more purulence than is usual. I believe he has cholangitis (hopefully controlled now with stent and partial stone removal) and probably also has cholecystitis.  Going for perc chole today with IR.  Objective:  Vital signs in last 24 hours: Temp:  [97.6 F (36.4 C)-98.4 F (36.9 C)] 97.6 F (36.4 C) (10/30 1213) Pulse Rate:  [63-69] 69 (10/30 1213) Resp:  [18-20] 20 (10/30 1213) BP: (108-134)/(62-90) 108/90 (10/30 1213) SpO2:  [93 %-99 %] 99 % (10/30 1213) Weight:  [101.6 kg] 101.6 kg (10/30 0535) Last BM Date: 07/08/18 General:  Alert, Well-developed, in NAD Heart:  Regular rate and rhythm; no murmurs Pulm:  CTAB.  No increased WOB. Abdomen:  Soft, non-distended.  BS present.  Non-tender. Extremities:  Without edema. Neurologic:  Alert and oriented x 4;  grossly normal neurologically. Psych:  Alert and cooperative. Normal mood and affect.  Intake/Output from previous day: 10/29 0701 - 10/30 0700 In: 1040 [P.O.:340; I.V.:500; IV Piggyback:200] Out: 2350 [Urine:2350] Intake/Output this shift: Total I/O In: 75 [P.O.:75] Out: 375 [Urine:375]  Lab Results: Recent Labs    07/08/18 0522 07/09/18 0426 07/10/18 0522  WBC 6.9 7.4 7.3  HGB 10.4* 10.0* 9.7*  HCT 32.8* 32.0* 31.1*  PLT 198 181 199   BMET Recent Labs    07/08/18 0522 07/09/18 0426 07/10/18 0522  NA 138 139 139  K 3.5 3.6 4.1  CL 110 110 110  CO2 22 22 24   GLUCOSE 128* 117* 96  BUN 17 15 17   CREATININE 1.61* 1.63* 1.60*  CALCIUM 8.0* 7.9* 8.1*   LFT Recent Labs    07/10/18 0522  PROT 5.0*  ALBUMIN 2.0*  AST 40  ALT 50*  ALKPHOS 178*  BILITOT 3.3*   PT/INR Recent Labs   07/10/18 0522  LABPROT 13.6  INR 1.05   Dg Ercp Biliary & Pancreatic Ducts  Result Date: 07/09/2018 CLINICAL DATA:  Common bile duct stone EXAM: ERCP TECHNIQUE: Multiple spot images obtained with the fluoroscopic device and submitted for interpretation post-procedure. FLUOROSCOPY TIME:  Fluoroscopy Time: Reported as 8 minutes, 24 seconds COMPARISON:  07/07/2018 MRI FINDINGS: Endoscopy and ERCP or performed by Dr. Oretha Caprice. I was called up to endoscopy and reviewed the images with him while the procedure was underway. There is prominent intrahepatic biliary dilatation along with filling defects in the common hepatic duct compatible with stones. We also fill a significant portion of the cystic duct which is also clearly filled with large stones. 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 Despite attempts. The final image demonstrates the biliary stent that Dr. Ardis Hughs placed traversing the obstruction. IMPRESSION: 1. 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 The final image demonstrates a stent extending from the dilated intrahepatic biliary system across the stenosis caused by the stones and extending towards the duodenum. These  images were submitted for radiologic interpretation only. Please see the procedural report for the amount of contrast and the fluoroscopy time utilized. Electronically Signed   By: Van Clines M.D.   On: 07/09/2018 11:33   Assessment / Plan: *84 y/o male with a history of AAA post stenting, AF on Eliquis, pacemaker, admitted after syncope, found to have mildly  elevated troponin on admission which normalized. Summary thus far - E. coli bacteremia, suspected UTI initially. Nodular appearing liver on ultrasound along with gallstones. LAE elevation.Cardiology had planned cardiac catheterization this admission but decided to wait because of the bacteremia. He also developed upper GI bleed secondary to [employer] tear. EUS done as well and revealed gallstones and sludge. Biliary tree was hard to visualize given shadowing stones. HIDA scan inconclusive. MRCP showed multiple common bile duct stones, and biliary ductal dilation.  ERCP on 10/29 showed CBD stones partially treated with sphincterotomy, balloon sweeping, and then plastic stent.  Cystic duct stones evident.  Suspected cholangitis as there was a lot of purulence.  For Perc chole with IR today.  LFT's trending down.    **Will need repeat ERCP with stent removal in 6-7 weeks or so.  He has been scheduled for OV follow-up with Dr. Rush Landmark on 11/22 at which time that procedure can be arranged.  Appt is in discharge instructions. **Continue to trend LFT's. **? Total 10-14 day course of antibiotics for cholangitis.    LOS: 8 days   Ernest Parker. Ernest Parker  07/10/2018, 12:57 PM

## 2018-07-10 NOTE — Sedation Documentation (Signed)
Pt sleeping. 

## 2018-07-10 NOTE — Sedation Documentation (Signed)
Patient is resting comfortably. No complaints at this time 

## 2018-07-10 NOTE — Procedures (Signed)
10 Fr cholecystostomy drain Frank pus EBL 0 Comp 0

## 2018-07-10 NOTE — Plan of Care (Signed)

## 2018-07-10 NOTE — Sedation Documentation (Signed)
Pt resting, procedure started.

## 2018-07-10 NOTE — Progress Notes (Signed)
Patient advised that he is still on the IR schedule for today.  Radiology not able to offer a definite time. Patient advised he remains on the schedule for today.

## 2018-07-10 NOTE — Progress Notes (Signed)
1 Day Post-Op   Subjective/Chief Complaint: Pt denies pain  Going for perc drain today  Discussed with pt and family    Objective: Vital signs in last 24 hours: Temp:  [97.7 F (36.5 C)-98.4 F (36.9 C)] 98.4 F (36.9 C) (10/30 0535) Pulse Rate:  [59-67] 63 (10/30 0535) Resp:  [18-27] 18 (10/30 0535) BP: (105-144)/(49-81) 134/62 (10/30 0535) SpO2:  [93 %-98 %] 93 % (10/30 0535) Weight:  [101.6 kg] 101.6 kg (10/30 0535) Last BM Date: 07/09/18  Intake/Output from previous day: 10/29 0701 - 10/30 0700 In: 1040 [P.O.:340; I.V.:500; IV Piggyback:200] Out: 2350 [Urine:2350] Intake/Output this shift: No intake/output data recorded.  General appearance: alert and cooperative Resp: clear to auscultation bilaterally Cardio: regular rate and rhythm, S1, S2 normal, no murmur, click, rub or gallop GI: soft, non-tender; bowel sounds normal; no masses,  no organomegaly  Lab Results:  Recent Labs    07/09/18 0426 07/10/18 0522  WBC 7.4 7.3  HGB 10.0* 9.7*  HCT 32.0* 31.1*  PLT 181 199   BMET Recent Labs    07/09/18 0426 07/10/18 0522  NA 139 139  K 3.6 4.1  CL 110 110  CO2 22 24  GLUCOSE 117* 96  BUN 15 17  CREATININE 1.63* 1.60*  CALCIUM 7.9* 8.1*   PT/INR Recent Labs    07/10/18 0522  LABPROT 13.6  INR 1.05   ABG No results for input(s): PHART, HCO3 in the last 72 hours.  Invalid input(s): PCO2, PO2  Studies/Results: Dg Ercp Biliary & Pancreatic Ducts  Result Date: 07/09/2018 CLINICAL DATA:  Common bile duct stone EXAM: ERCP TECHNIQUE: Multiple spot images obtained with the fluoroscopic device and submitted for interpretation post-procedure. FLUOROSCOPY TIME:  Fluoroscopy Time: Reported as 8 minutes, 24 seconds COMPARISON:  07/07/2018 MRI FINDINGS: Endoscopy and ERCP or performed by Dr. Oretha Caprice. I was called up to endoscopy and reviewed the images with him while the procedure was underway. There is prominent intrahepatic biliary dilatation along with  filling defects in the common hepatic duct compatible with stones. We also fill a significant portion of the cystic duct which is also clearly filled with large stones. 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 Despite attempts. The final image demonstrates the biliary stent that Dr. Ardis Hughs placed traversing the obstruction. IMPRESSION: 1. 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 The final image demonstrates a stent extending from the dilated intrahepatic biliary system across the stenosis caused by the stones and extending towards the duodenum. These images were submitted for radiologic interpretation only. Please see the procedural report for the amount of contrast and the fluoroscopy time utilized. Electronically Signed   By: Van Clines M.D.   On: 07/09/2018 11:33    Anti-infectives: Anti-infectives (From admission, onward)   Start     Dose/Rate Route Frequency Ordered Stop   07/10/18 1000  vancomycin (VANCOCIN) IVPB 1000 mg/200 mL premix     1,000 mg 200 mL/hr over 60 Minutes Intravenous To Radiology 07/10/18 0945 07/11/18 1000   07/05/18 1200  levofloxacin (LEVAQUIN) IVPB 500 mg     500 mg 100 mL/hr over  60 Minutes Intravenous Every 24 hours 07/05/18 1049     07/04/18 1800  Levofloxacin (LEVAQUIN) IVPB 250 mg  Status:  Discontinued     250 mg 50 mL/hr over 60 Minutes Intravenous Every 24 hours 07/03/18 1704 07/04/18 0940   07/04/18 0945  aztreonam (AZACTAM) 1 g in sodium chloride 0.9 % 100 mL IVPB  Status:  Discontinued     1 g 200 mL/hr over 30 Minutes Intravenous Every 8  hours 07/04/18 0940 07/05/18 1049   07/03/18 1645  levofloxacin (LEVAQUIN) IVPB 500 mg     500 mg 100 mL/hr over 60 Minutes Intravenous  Once 07/03/18 1633 07/03/18 2035      Assessment/Plan: s/p Procedure(s): ENDOSCOPIC RETROGRADE CHOLANGIOPANCREATOGRAPHY (ERCP) (N/A) SPHINCTEROTOMY REMOVAL OF STONES BILIARY STENT PLACEMENT Choledocholithiasis/cholecystitis  -perc drain due to amount of purulent material on ERCP Can eat after drain  Follow up as outpatient   Bacteremia, E. Coli -on abx therapy UTI -CXs reveal Klebsiella pneumo and Enterococcus faecalis -treatment per primary team UGI bleed, Mallory-Weiss tear, esophagitis, duodenitis -on PPI therapy, per GI A. Fib -on cardizem and eliquis CAD -needs ischemia work up as outpatient per cardiology notes  FEN -NPO VTE -SCDs/ eliquis on hold ID -Levaquin   LOS: 8 days    [name] A Laurella Tull 07/10/2018

## 2018-07-10 NOTE — Progress Notes (Signed)
Spoke with MD regarding pt elevated b/p. New orders noted.

## 2018-07-11 ENCOUNTER — Encounter (HOSPITAL_COMMUNITY): Payer: Self-pay | Admitting: Interventional Radiology

## 2018-07-11 ENCOUNTER — Other Ambulatory Visit: Payer: Self-pay | Admitting: Radiology

## 2018-07-11 DIAGNOSIS — K810 Acute cholecystitis: Secondary | ICD-10-CM

## 2018-07-11 LAB — CULTURE, BLOOD (ROUTINE X 2)
Culture: NO GROWTH
Special Requests: ADEQUATE

## 2018-07-11 LAB — CBC
HCT: 32.1 % — ABNORMAL LOW (ref 39.0–52.0)
Hemoglobin: 10.1 g/dL — ABNORMAL LOW (ref 13.0–17.0)
MCH: 29.2 pg (ref 26.0–34.0)
MCHC: 31.5 g/dL (ref 30.0–36.0)
MCV: 92.8 fL (ref 80.0–100.0)
NRBC: 0 % (ref 0.0–0.2)
PLATELETS: 216 10*3/uL (ref 150–400)
RBC: 3.46 MIL/uL — ABNORMAL LOW (ref 4.22–5.81)
RDW: 16.1 % — AB (ref 11.5–15.5)
WBC: 6.6 10*3/uL (ref 4.0–10.5)

## 2018-07-11 LAB — COMPREHENSIVE METABOLIC PANEL
ALBUMIN: 2 g/dL — AB (ref 3.5–5.0)
ALT: 47 U/L — ABNORMAL HIGH (ref 0–44)
ANION GAP: 6 (ref 5–15)
AST: 38 U/L (ref 15–41)
Alkaline Phosphatase: 177 U/L — ABNORMAL HIGH (ref 38–126)
BUN: 13 mg/dL (ref 8–23)
CHLORIDE: 110 mmol/L (ref 98–111)
CO2: 22 mmol/L (ref 22–32)
Calcium: 8.2 mg/dL — ABNORMAL LOW (ref 8.9–10.3)
Creatinine, Ser: 1.62 mg/dL — ABNORMAL HIGH (ref 0.61–1.24)
GFR calc Af Amer: 44 mL/min — ABNORMAL LOW (ref >60)
GFR calc non Af Amer: 38 mL/min — ABNORMAL LOW (ref >60)
GLUCOSE: 103 mg/dL — AB (ref 70–99)
POTASSIUM: 3.8 mmol/L (ref 3.5–5.1)
SODIUM: 138 mmol/L (ref 135–145)
TOTAL PROTEIN: 5.3 g/dL — AB (ref 6.5–8.1)
Total Bilirubin: 2.7 mg/dL — ABNORMAL HIGH (ref 0.3–1.2)

## 2018-07-11 LAB — GLUCOSE, CAPILLARY: Glucose-Capillary: 112 mg/dL — ABNORMAL HIGH (ref 70–99)

## 2018-07-11 MED ORDER — METOPROLOL SUCCINATE ER 25 MG PO TB24: 25.0000 mg | ORAL_TABLET | Freq: Two times a day (BID) | ORAL | 0 refills | Status: DC

## 2018-07-11 MED ORDER — LEVOFLOXACIN 500 MG PO TABS: 500.0000 mg | ORAL_TABLET | Freq: Every day | ORAL | 0 refills | Status: AC

## 2018-07-11 MED ORDER — PANTOPRAZOLE SODIUM 40 MG PO TBEC: 40.0000 mg | DELAYED_RELEASE_TABLET | Freq: Two times a day (BID) | ORAL | 0 refills | Status: DC

## 2018-07-11 MED ORDER — DOCUSATE SODIUM 100 MG PO CAPS: 100.0000 mg | ORAL_CAPSULE | Freq: Two times a day (BID) | ORAL | 0 refills | Status: DC

## 2018-07-11 MED ORDER — LEVOFLOXACIN 500 MG PO TABS
500.0000 mg | ORAL_TABLET | Freq: Every day | ORAL | Status: DC
  Administered 2018-07-11: 500 mg via ORAL
  Filled 2018-07-11: qty 1

## 2018-07-11 NOTE — Discharge Instructions (Addendum)
Flush drain daily with 5 mL sterile NS flush. Keep drain site clean and dry, change top dressing as needed. May shower with drain, do not submerge in water, like bathtub etc  Low-Fat Diet for Pancreatitis or Gallbladder Conditions A low-fat diet can be helpful if you have pancreatitis or a gallbladder condition. With these conditions, your pancreas and gallbladder have trouble digesting fats. A healthy eating plan with less fat will help rest your pancreas and gallbladder and reduce your symptoms. What do I need to know about this diet?  Eat a low-fat diet. ? Reduce your fat intake to less than 20-30% of your total daily calories. This is less than 50-60 g of fat per day. ? Remember that you need some fat in your diet. Ask your dietician what your daily goal should be. ? Choose nonfat and low-fat healthy foods. Look for the words nonfat, low fat, or fat free. ? As a guide, look on the label and choose foods with less than 3 g of fat per serving. Eat only one serving.  Avoid alcohol.  Do not smoke. If you need help quitting, talk with your health care provider.  Eat small frequent meals instead of three large heavy meals. What foods can I eat? Grains Include healthy grains and starches such as potatoes, wheat bread, fiber-rich cereal, and brown rice. Choose whole grain options whenever possible. In adults, whole grains should account for 45-65% of your daily calories. Fruits and Vegetables Eat plenty of fruits and vegetables. Fresh fruits and vegetables add fiber to your diet. Meats and Other Protein Sources Eat lean meat such as chicken and pork. Trim any fat off of meat before cooking it. Eggs, fish, and beans are other sources of protein. In adults, these foods should account for 10-35% of your daily calories. Dairy Choose low-fat milk and dairy options. Dairy includes fat and protein, as well as calcium. Fats and Oils Limit high-fat foods such as fried foods, sweets, baked  goods, sugary drinks. Other Creamy sauces and condiments, such as mayonnaise, can add extra fat. Think about whether or not you need to use them, or use smaller amounts or low fat options. What foods are not recommended?  High fat foods, such as: ? [employer]. ? Ice cream. ? [place] toast. ? Sweet rolls. ? Pizza. ? Cheese bread. ? Foods covered with batter, butter, creamy sauces, or cheese. ? Fried foods. ? Sugary drinks and desserts.  Foods that cause gas or bloating This information is not intended to replace advice given to you by your health care provider. Make sure you discuss any questions you have with your health care provider. Document Released: 09/02/2013 Document Revised: 02/03/2016 Document Reviewed: 08/11/2013 Elsevier Interactive Patient Education  2017 [employer].

## 2018-07-11 NOTE — Progress Notes (Signed)
Patient ID: Ernest Parker, male   DOB: Jan 19, 1934, 84 y.o.   MRN: [id number]    2 Days Post-Op  Subjective: Pt feels well today.  No complaints.  Had drain placed yesterday with frank pus in gallbladder.  Ate breakfast this morning with no issues.  Objective: Vital signs in last 24 hours: Temp:  [97.5 F (36.4 C)-98.3 F (36.8 C)] 98.2 F (36.8 C) (10/31 0814) Pulse Rate:  [60-69] 63 (10/31 0814) Resp:  [16-21] 21 (10/31 0814) BP: (108-170)/(67-90) 146/67 (10/31 0814) SpO2:  [93 %-99 %] 95 % (10/31 0814) Weight:  [100.8 kg] 100.8 kg (10/31 0640) Last BM Date: 07/10/18  Intake/Output from previous day: 10/30 0701 - 10/31 0700 In: 59 [P.O.:313] Out: 2145 [Urine:2025; Drains:120] Intake/Output this shift: Total I/O In: 3 [I.V.:3] Out: -   PE: Abd: soft, NT, ND, +BS, perc chole in place with seropurulent output.  Lab Results:  Recent Labs    07/10/18 0522 07/11/18 0611  WBC 7.3 6.6  HGB 9.7* 10.1*  HCT 31.1* 32.1*  PLT 199 216   BMET Recent Labs    07/10/18 0522 07/11/18 0611  NA 139 138  K 4.1 3.8  CL 110 110  CO2 24 22  GLUCOSE 96 103*  BUN 17 13  CREATININE 1.60* 1.62*  CALCIUM 8.1* 8.2*   PT/INR Recent Labs    07/10/18 0522  LABPROT 13.6  INR 1.05   CMP     Component Value Date/Time   NA 138 07/11/2018 0611   NA 141 12/13/2017 0751   K 3.8 07/11/2018 0611   CL 110 07/11/2018 0611   CO2 22 07/11/2018 0611   GLUCOSE 103 (H) 07/11/2018 0611   BUN 13 07/11/2018 0611   BUN 19 12/13/2017 0751   CREATININE 1.62 (H) 07/11/2018 0611   CALCIUM 8.2 (L) 07/11/2018 0611   PROT 5.3 (L) 07/11/2018 0611   ALBUMIN 2.0 (L) 07/11/2018 0611   AST 38 07/11/2018 0611   ALT 47 (H) 07/11/2018 0611   ALKPHOS 177 (H) 07/11/2018 0611   BILITOT 2.7 (H) 07/11/2018 0611   GFRNONAA 38 (L) 07/11/2018 0611   GFRAA 44 (L) 07/11/2018 6160   Lipase  No results found for: LIPASE     Studies/Results: Ir Perc Cholecystostomy  Result Date: 07/11/2018 INDICATION:  Acute cholecystitis EXAM: CHOLECYSTOSTOMY MEDICATIONS: The patient is currently on Levaquin ANESTHESIA/SEDATION: Fentanyl 50 mcg IV; Versed 1 mg IV Moderate Sedation Time:  6 minutes The patient was continuously monitored during the procedure by the interventional radiology nurse under my direct supervision. FLUOROSCOPY TIME:  Fluoroscopy Time:  minutes 24 seconds (3 mGy). COMPLICATIONS: None immediate. PROCEDURE: Informed written consent was obtained from the patient after a thorough discussion of the procedural risks, benefits and alternatives. All questions were addressed. Maximal Sterile Barrier Technique was utilized including caps, mask, sterile gowns, sterile gloves, sterile drape, hand hygiene and skin antiseptic. A timeout was performed prior to the initiation of the procedure. The right upper quadrant was prepped with ChloraPrep in a sterile fashion, and a sterile drape was applied covering the operative field. A sterile gown and sterile gloves were used for the procedure. A 21 gauge needle was inserted into the gallbladder lumen under sonographic guidance and via transhepatic approach. It was removed over a 018 wire, which was upsized to a 3-J. A 10-French drain was advanced over the wire and coiled in the gallbladder lumen. It was sewn to the skin after being string fixed. Frank pus was aspirated. FINDINGS: The cystic duct  is occluded. Final imaging demonstrates a 10 French drain coiled in the lumen of the gallbladder. IMPRESSION: Successful cholecystostomy. This needs to remain in place at least 6 weeks. Electronically Signed   By: Marybelle Killings M.D.   On: 07/11/2018 07:52   Dg Ercp Biliary & Pancreatic Ducts  Result Date: 07/09/2018 CLINICAL DATA:  Common bile duct stone EXAM: ERCP TECHNIQUE: Multiple spot images obtained with the fluoroscopic device and submitted for interpretation post-procedure. FLUOROSCOPY TIME:  Fluoroscopy Time: Reported as 8 minutes, 24 seconds COMPARISON:  07/07/2018 MRI  FINDINGS: Endoscopy and ERCP or performed by Dr. Oretha Caprice. I was called up to endoscopy and reviewed the images with him while the procedure was underway. There is prominent intrahepatic biliary dilatation along with filling defects in the common hepatic duct compatible with stones. We also fill a significant portion of the cystic duct which is also clearly filled with large stones. 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 Despite attempts. The final image demonstrates the biliary stent that Dr. Ardis Hughs placed traversing the obstruction. IMPRESSION: 1. 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 The final image demonstrates a stent extending from the dilated intrahepatic biliary system across the stenosis caused by the stones and extending towards the duodenum. These images were submitted for radiologic interpretation only. Please see the procedural report for the amount of contrast and the fluoroscopy time utilized. Electronically Signed   By: Van Clines M.D.   On: 07/09/2018 11:33    Anti-infectives: Anti-infectives (From admission, onward)   Start     Dose/Rate Route Frequency Ordered Stop   07/10/18 1000  vancomycin (VANCOCIN) 1,500 mg in sodium chloride 0.9 % 500 mL IVPB     1,500 mg 250 mL/hr over 120 Minutes Intravenous To Radiology 07/10/18 0945 07/11/18 1000   07/05/18 1200  levofloxacin (LEVAQUIN) IVPB 500 mg     500 mg 100 mL/hr over 60 Minutes Intravenous Every 24 hours 07/05/18 1049     07/04/18 1800  Levofloxacin (LEVAQUIN) IVPB 250 mg  Status:  Discontinued     250 mg 50  mL/hr over 60 Minutes Intravenous Every 24 hours 07/03/18 1704 07/04/18 0940   07/04/18 0945  aztreonam (AZACTAM) 1 g in sodium chloride 0.9 % 100 mL IVPB  Status:  Discontinued     1 g 200 mL/hr over 30 Minutes Intravenous Every 8 hours 07/04/18 0940 07/05/18 1049   07/03/18 1645  levofloxacin (LEVAQUIN) IVPB 500 mg     500 mg 100 mL/hr over 60 Minutes Intravenous  Once 07/03/18 1633 07/03/18 2035       Assessment/Plan Choledocholithiasis/cholecystitis  -perc chole drain placed yesterday.  Patient doing great.  He is surgically stable for DC home with at least 7 more days of abx therapy or whatever needed to completely cover his bacteremia. -he will need drain clinic follow up in 4-5 weeks for drain injection.  He will then follow up with Dr. Brantley Stage to discuss surgical options, which will depend on cardiac clearance as well as GI plan for repeat ERCP etc.  Bacteremia, E. Coli -on abx therapy UTI -CXs reveal Klebsiella pneumo  and Enterococcus faecalis -treatment per primary team UGI bleed, Mallory-Weiss tear, esophagitis, duodenitis -on PPI therapy, per GI A. Fib -on cardizem and eliquis CAD -needs ischemia work up as outpatient per cardiology notes  FEN -low fat diet VTE -SCDs/ eliquis on hold ID -Levaquin   LOS: 9 days    Henreitta Cea , [facility] 07/11/2018, 9:23 AM Pager: [phone]

## 2018-07-11 NOTE — Care Management Important Message (Signed)
Important Message  Patient Details  Name: MARQUI FORMBY MRN: [id number] Date of Birth: 21-Oct-1933   Medicare Important Message Given:  Yes    Ronelle Michie P Addisen Chappelle 07/11/2018, 4:09 PM

## 2018-07-11 NOTE — Progress Notes (Signed)
Chief Complaint: Patient was seen today for perc chole check   Supervising Physician: Aletta Edouard  Patient Status: [facility] - In-pt  Subjective: S/p perc chole drain yesterday. Sore last night, but feels much better this am. Tolerated reg breakfast this am. Family at bedside  Objective: Physical Exam: BP (!) 146/67 (BP Location: Left Arm)   Pulse 63   Temp 98.2 F (36.8 C) (Oral)   Resp (!) 21   Ht 5\' 10"  (1.778 m)   Wt 100.8 kg   SpO2 95%   BMI 31.88 kg/m  RUQ perc chole drain intact, site clean, NT Output mix of serosanguinous fluid with some purulent stranding   Current Facility-Administered Medications:  .  0.9 %  sodium chloride infusion, , Intravenous, PRN, Milus Banister, MD, Stopped at 07/09/18 1357 .  acetaminophen (TYLENOL) tablet 650 mg, 650 mg, Oral, Q6H PRN, 650 mg at 07/10/18 2201 **OR** acetaminophen (TYLENOL) suppository 650 mg, 650 mg, Rectal, Q6H PRN, Milus Banister, MD .  diltiazem (CARDIZEM CD) 24 hr capsule 120 mg, 120 mg, Oral, Daily, Milus Banister, MD, 120 mg at 07/10/18 8786 .  diltiazem (CARDIZEM) 100 mg in dextrose 5% 152mL (1 mg/mL) infusion, 5-15 mg/hr, Intravenous, Continuous, Milus Banister, MD, Stopped at 07/04/18 0945 .  docusate sodium (COLACE) capsule 100 mg, 100 mg, Oral, BID, Milus Banister, MD, 100 mg at 07/10/18 0936 .  insulin aspart (novoLOG) injection 0-9 Units, 0-9 Units, Subcutaneous, TID WC, Milus Banister, MD, 1 Units at 07/10/18 1234 .  levofloxacin (LEVAQUIN) IVPB 500 mg, 500 mg, Intravenous, Q24H, Milus Banister, MD, Last Rate: 100 mL/hr at 07/10/18 1232, 500 mg at 07/10/18 1232 .  metoprolol succinate (TOPROL-XL) 24 hr tablet 25 mg, 25 mg, Oral, BID, Milus Banister, MD, 25 mg at 07/10/18 2201 .  metoprolol tartrate (LOPRESSOR) injection 2.5 mg, 2.5 mg, Intravenous, Q6H PRN, Milus Banister, MD, 2.5 mg at 07/03/18 1655 .  morphine 2 MG/ML injection 2 mg, 2 mg, Intravenous, Q2H PRN, Milus Banister, MD .   ondansetron [facility]) tablet 4 mg, 4 mg, Oral, Q6H PRN **OR** ondansetron (ZOFRAN) injection 4 mg, 4 mg, Intravenous, Q6H PRN, Milus Banister, MD, 4 mg at 07/03/18 1804 .  pantoprazole (PROTONIX) EC tablet 40 mg, 40 mg, Oral, BID, Milus Banister, MD, 40 mg at 07/10/18 2201 .  sodium chloride flush (NS) 0.9 % injection 3 mL, 3 mL, Intravenous, Q12H, Milus Banister, MD, 3 mL at 07/10/18 2203 .  sodium chloride flush (NS) 0.9 % injection 5 mL, 5 mL, Intracatheter, Q8H, Hoss, Arthur, MD, 5 mL at 07/11/18 0649 .  tamsulosin (FLOMAX) capsule 0.4 mg, 0.4 mg, Oral, BID, Milus Banister, MD, 0.4 mg at 07/10/18 2201 .  vancomycin (VANCOCIN) 1,500 mg in sodium chloride 0.9 % 500 mL IVPB, 1,500 mg, Intravenous, to Edward Jolly, PA-C  Labs: CBC Recent Labs    07/10/18 0522 07/11/18 0611  WBC 7.3 6.6  HGB 9.7* 10.1*  HCT 31.1* 32.1*  PLT 199 216   BMET Recent Labs    07/10/18 0522 07/11/18 0611  NA 139 138  K 4.1 3.8  CL 110 110  CO2 24 22  GLUCOSE 96 103*  BUN 17 13  CREATININE 1.60* 1.62*  CALCIUM 8.1* 8.2*   LFT Recent Labs    07/11/18 0611  PROT 5.3*  ALBUMIN 2.0*  AST 38  ALT 47*  ALKPHOS 177*  BILITOT 2.7*   PT/INR Recent Labs  07/10/18 0522  LABPROT 13.6  INR 1.05     Studies/Results: Ir Perc Cholecystostomy  Result Date: 07/11/2018 INDICATION: Acute cholecystitis EXAM: CHOLECYSTOSTOMY MEDICATIONS: The patient is currently on Levaquin ANESTHESIA/SEDATION: Fentanyl 50 mcg IV; Versed 1 mg IV Moderate Sedation Time:  6 minutes The patient was continuously monitored during the procedure by the interventional radiology nurse under my direct supervision. FLUOROSCOPY TIME:  Fluoroscopy Time:  minutes 24 seconds (3 mGy). COMPLICATIONS: None immediate. PROCEDURE: Informed written consent was obtained from the patient after a thorough discussion of the procedural risks, benefits and alternatives. All questions were addressed. Maximal Sterile Barrier Technique  was utilized including caps, mask, sterile gowns, sterile gloves, sterile drape, hand hygiene and skin antiseptic. A timeout was performed prior to the initiation of the procedure. The right upper quadrant was prepped with ChloraPrep in a sterile fashion, and a sterile drape was applied covering the operative field. A sterile gown and sterile gloves were used for the procedure. A 21 gauge needle was inserted into the gallbladder lumen under sonographic guidance and via transhepatic approach. It was removed over a 018 wire, which was upsized to a 3-J. A 10-French drain was advanced over the wire and coiled in the gallbladder lumen. It was sewn to the skin after being string fixed. Frank pus was aspirated. FINDINGS: The cystic duct is occluded. Final imaging demonstrates a 10 French drain coiled in the lumen of the gallbladder. IMPRESSION: Successful cholecystostomy. This needs to remain in place at least 6 weeks. Electronically Signed   By: Marybelle Killings M.D.   On: 07/11/2018 07:52   Dg Ercp Biliary & Pancreatic Ducts  Result Date: 07/09/2018 CLINICAL DATA:  Common bile duct stone EXAM: ERCP TECHNIQUE: Multiple spot images obtained with the fluoroscopic device and submitted for interpretation post-procedure. FLUOROSCOPY TIME:  Fluoroscopy Time: Reported as 8 minutes, 24 seconds COMPARISON:  07/07/2018 MRI FINDINGS: Endoscopy and ERCP or performed by Dr. Oretha Caprice. I was called up to endoscopy and reviewed the images with him while the procedure was underway. There is prominent intrahepatic biliary dilatation along with filling defects in the common hepatic duct compatible with stones. We also fill a significant portion of the cystic duct which is also clearly filled with large stones. 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 Despite  attempts. The final image demonstrates the biliary stent that Dr. Ardis Hughs placed traversing the obstruction. IMPRESSION: 1. 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 The final image demonstrates a stent extending from the dilated intrahepatic biliary system across the stenosis caused by the stones and extending towards the duodenum. These images were submitted for radiologic interpretation only. Please see the procedural report for the amount of contrast and the fluoroscopy time utilized. Electronically Signed   By: Van Clines M.D.   On: 07/09/2018 11:33    Assessment/Plan: Acute cholecystitis S/p perc chole drain. Flush daily, instructions reviewed with family. Recommend drain to stay minimum 6 weeks prior to cholangiogram/removal unless surgical plans prior to then.    LOS: 9 days   I spent a total of 15 minutes in face to face in clinical  consultation, greater than 50% of which was counseling/coordinating care for perc chole drain  Ascencion Dike PA-C 07/11/2018 8:49 AM

## 2018-07-11 NOTE — Discharge Summary (Addendum)
[facility]  Physician Discharge Summary   Patient ID: Ernest Parker MRN: [id number] DOB/AGE: October 23, 1933 84 y.o.  Admit date: 07/01/2018 Discharge date: 07/11/2018  PCP: Seward Carol, MD  DISCHARGE DIAGNOSES:  Acute cholecystitis with cholangitis Choledocholithiasis status post ERCP with stent placement E. coli bacteremia Chronic indwelling Foley Syncope with elevated troponin Probable upper GI bleed Acute kidney injury on chronic kidney disease stage III Chronic urinary retention Thrombocytopenia    RECOMMENDATIONS FOR OUTPATIENT FOLLOW UP: 1. Outpatient follow-up with cardiology, urology, general surgery, gastroenterology and interventional radiology  DISCHARGE CONDITION: fair  Diet recommendation: Heart healthy  Filed Weights   07/09/18 0545 07/10/18 0535 07/11/18 0640  Weight: 101.7 kg 101.6 kg 100.8 kg    INITIAL HISTORY: 84 year old male with history of A. fib on Eliquis, status post pacemaker in 09/2017, remote prostate cancer, hypertension, hyperlipidemia presented withunresponsiveness. CT of the head was negative for bleed. Troponin was mildly elevated. Cardiology was consulted. He was started on IV fluids for AKI. LFTs were mildly elevated. GI was consulted. He was planned for cardiac catheterization on 07/04/2018. Patient had chills on 07/03/2018 and went into A. fib with RVR. He was started on empiric antibiotics and had to be placed on Cardizem drip. He also had upper GI bleeding. He had EGD with EUS on 07/05/2018. HIDA scan was inconclusive. Subsequent MRCP showed acute calculus cholecystitis with choledocholithiasis. General surgery was also consulted.  Patient subsequently underwent ERCP which revealed purulent material.  Consultants: Gastroenterology.  General surgery.  Cardiology.  Procedures:   Transthoracic echocardiogram Study Conclusions  - Left ventricle: The cavity size was normal. There was moderate concentric  hypertrophy. Systolic function was normal. The estimated ejection fraction was in the range of 55% to 60%. Wall motion was normal; there were no regional wall motion abnormalities. - Aortic valve: There was trivial regurgitation. - Mitral valve: Mildly calcified annulus. There was mild regurgitation. - Left atrium: The atrium was moderately dilated.  EGD and EUS on 07/05/2018 EGD Impression: - No gross lesions in proximal esophagus. - Non-bleeding esophageal ulcers in middle  esophagus. Biopsied. - Salmon-colored mucosa suspicious for long-segment  Barrett's esophagus and classified as Barrett's  stage C0-M6 per Prague criteria. Biopsied to  confirm diagnosis. - Mallory-Weiss tear that is healing at the distal  GE Junction.. - Z-line irregular, 36 cm from the incisors. - Medium-sized hiatal hernia. - No other gross lesions in the stomach. Biopsied. - Erosive duodenopathy. - Non-bleeding duodenal diverticulum. - Normal major papilla but is located entirely  intradiverticular. - No gross lesions in the third portion of the  duodenum. EUS Impression: - Hyperechoic material consistent with sludge and  debris was visualized endosonographically in the  gallbladder. There is significant obscuring of the  common hepatic duct region as a result of this. - There was dilation in the middle third of  the  main bile duct and there was evidence/concern for  hyperechoic material consistent with sludge was  visualized endosonographically in this region,  however, the overt cystic duct insertion and then  the development of the gallbladder obscuring  shadowing defects makes things difficult to see,  but sludge is noted. - Pancreatic parenchymal abnormalities consisting  of atrophy were noted in the genu of the pancreas,  pancreatic body and pancreatic tail. But the duct  itself, had a normal endosonographic appearance in  the pancreatic head, genu of the pancreas, body of  the pancreas and tail of the pancreas. - A cyst was found in the left lobe of  the liver  and measured 28 mm by 26 mm. - Ascites was found on endosonographic examination  of the peritoneal cavity.  ERCP 10/29 Impression: - Choledocholithiasis, partially treated with  sphincterotomy, balloon sweeping and then placement  of plastic stent. - Cyst duct stones evident. - Much more purulence than is usual. I believe he  has cholangitis (hopefully controlled now with  stent and partial stone removal) and probably also  has cholecystitis.    HOSPITAL COURSE:   Acute cholecystitis with choledocholithiasis Patient seen by gastroenterology and surgery.  MRCP done on 10/27 was suggestive of  cholecystitis with choledocholithiasis.  Patient underwent ERCP on 10/29 with removal of stone.  Purulent material was noted.    Patient underwent sphincterotomy and stent placement.  Patient subsequently underwent percutaneous cholecystostomy drain placement by interventional radiology.  Patient will eventually need to undergo an elective laparoscopic cholecystectomy after patient has been cleared by cardiology.  Patient will be seen by gastroenterology in the next few weeks for stent removal.  Patient will be followed by interventional radiology drain clinic as well as general surgery.  E. coli bacteremia Most likely from biliary source.  Blood culture from 10/26 was positive for coag negative staph thought to be a contaminant as this was isolated and only one set.  The patient has been afebrile.  Levaquin to be continued at home.  Urinary tract infection in the setting of chronic indwelling Foley Urine culture grew Klebsiella and enterococcus. Unclear if this is a true infection or colonization. Foley catheter was changed on 10/23.  Outpatient follow-up with urology.  Syncope with elevated troponin Etiology unclear.  Echocardiogram shows normal systolic function.  Could be related to the orthostatic hypotension. Cardiology had planned for cardiac catheterization but this had to be postponed due to bacteremia.  This can be addressed in the outpatient setting.  Patient denies any chest pain.  May resume his home medications.  Paroxysmal atrial fibrillation/Pacemaker Patient also has a pacemaker as a result of bradycardia earlier this year.  Was transiently on Cardizem infusion.  Currently rate controlled.  Patient is on oral Cardizem and beta-blocker.  Was on heparin infusion.  This was discontinued due to GI bleed.  Was on Eliquis at home.  Discussed with gastroenterology.  Okay to resume Eliquis per Dr. Havery Moros.  Probable upper GI bleed Patient status post EGD with EUS which showed  esophageal ulcers Barrett's esophagus and healing Mallory-Weiss tear.  Patient on PPI. Okay to resume Eliquis per Dr. Havery Moros.  Acute kidney injury on chronic kidney disease stage III Creatinine is stable at baseline now.    Chronic urinary retention with chronic indwelling Foley catheter Outpatient follow-up with urology.  Foley catheter changed on 10/23.  Thrombocytopenia Unclear etiology.  Now resolved.  Overall stable.  Okay for discharge home today.  PERTINENT LABS:  The results of significant diagnostics from this hospitalization (including imaging, microbiology, ancillary and laboratory) are listed below for reference.    Microbiology: Recent Results (from the past 240 hour(s))  Culture, Urine     Status: Abnormal   Collection Time: 07/02/18  7:37 AM  Result Value Ref Range Status   Specimen Description URINE, RANDOM  Final   Special Requests   Final    NONE Performed at [facility], [address] [place], [place] [address]    Culture (A)  Final    >=100,000 COLONIES/mL KLEBSIELLA PNEUMONIAE 40,000 COLONIES/mL ENTEROCOCCUS FAECALIS    Report Status 07/05/2018 FINAL  Final   Organism ID,  Bacteria KLEBSIELLA PNEUMONIAE (A)  Final   Organism ID, Bacteria ENTEROCOCCUS FAECALIS (A)  Final      Susceptibility   Enterococcus faecalis - MIC*    AMPICILLIN <=2 SENSITIVE Sensitive     LEVOFLOXACIN 0.5 SENSITIVE Sensitive     NITROFURANTOIN <=16 SENSITIVE Sensitive     VANCOMYCIN <=0.5 SENSITIVE Sensitive     * 40,000 COLONIES/mL ENTEROCOCCUS FAECALIS   Klebsiella pneumoniae - MIC*    AMPICILLIN >=32 RESISTANT Resistant     CEFAZOLIN <=4 SENSITIVE Sensitive     CEFTRIAXONE <=1 SENSITIVE Sensitive     CIPROFLOXACIN <=0.25 SENSITIVE Sensitive     GENTAMICIN <=1 SENSITIVE Sensitive     IMIPENEM <=0.25 SENSITIVE Sensitive     NITROFURANTOIN 128 RESISTANT Resistant     TRIMETH/SULFA <=20 SENSITIVE Sensitive     AMPICILLIN/SULBACTAM 16 INTERMEDIATE Intermediate      PIP/TAZO 16 SENSITIVE Sensitive     Extended ESBL NEGATIVE Sensitive     * >=100,000 COLONIES/mL KLEBSIELLA PNEUMONIAE  Culture, blood (routine x 2)     Status: None   Collection Time: 07/02/18 10:45 PM  Result Value Ref Range Status   Specimen Description BLOOD LEFT ANTECUBITAL  Final   Special Requests   Final    BOTTLES DRAWN AEROBIC ONLY Blood Culture results may not be optimal due to an inadequate volume of blood received in culture bottles   Culture   Final    NO GROWTH 5 DAYS Performed at [facility], [address] [place], [place] [address]    Report Status 07/07/2018 FINAL  Final  Culture, blood (routine x 2)     Status: None   Collection Time: 07/02/18 10:51 PM  Result Value Ref Range Status   Specimen Description BLOOD LEFT FOREARM  Final   Special Requests   Final    BOTTLES DRAWN AEROBIC AND ANAEROBIC Blood Culture results may not be optimal due to an inadequate volume of blood received in culture bottles   Culture   Final    NO GROWTH 5 DAYS Performed at [facility], [place] [address], [place], [place] [address]    Report Status 07/07/2018 FINAL  Final  Culture, blood (routine x 2)     Status: Abnormal   Collection Time: 07/03/18  4:15 PM  Result Value Ref Range Status   Specimen Description BLOOD RIGHT ANTECUBITAL  Final   Special Requests   Final    BOTTLES DRAWN AEROBIC AND ANAEROBIC Blood Culture adequate volume   Culture  Setup Time   Final    GRAM NEGATIVE RODS IN BOTH AEROBIC AND ANAEROBIC BOTTLES CRITICAL RESULT CALLED TO, READ BACK BY AND VERIFIED WITH: Zoila Shutter [phone] MLM Performed at [facility], [place] [address] [place],  [address]    Culture ESCHERICHIA COLI (A)  Final   Report Status 07/06/2018 FINAL  Final   Organism ID, Bacteria ESCHERICHIA COLI  Final      Susceptibility   Escherichia coli - MIC*    AMPICILLIN 4 SENSITIVE Sensitive     CEFAZOLIN <=4 SENSITIVE Sensitive     CEFEPIME <=1 SENSITIVE  Sensitive     CEFTAZIDIME <=1 SENSITIVE Sensitive     CEFTRIAXONE <=1 SENSITIVE Sensitive     CIPROFLOXACIN <=0.25 SENSITIVE Sensitive     GENTAMICIN <=1 SENSITIVE Sensitive     IMIPENEM <=0.25 SENSITIVE Sensitive     TRIMETH/SULFA <=20 SENSITIVE Sensitive     AMPICILLIN/SULBACTAM <=2 SENSITIVE Sensitive  PIP/TAZO <=4 SENSITIVE Sensitive     Extended ESBL NEGATIVE Sensitive     * ESCHERICHIA COLI  Blood Culture ID Panel (Reflexed)     Status: Abnormal   Collection Time: 07/03/18  4:15 PM  Result Value Ref Range Status   Enterococcus species NOT DETECTED NOT DETECTED Final   Listeria monocytogenes NOT DETECTED NOT DETECTED Final   Staphylococcus species NOT DETECTED NOT DETECTED Final   Staphylococcus aureus (BCID) NOT DETECTED NOT DETECTED Final   Streptococcus species NOT DETECTED NOT DETECTED Final   Streptococcus agalactiae NOT DETECTED NOT DETECTED Final   Streptococcus pneumoniae NOT DETECTED NOT DETECTED Final   Streptococcus pyogenes NOT DETECTED NOT DETECTED Final   Acinetobacter baumannii NOT DETECTED NOT DETECTED Final   Enterobacteriaceae species DETECTED (A) NOT DETECTED Final    Comment: Enterobacteriaceae represent a large family of gram-negative bacteria, not a single organism. CRITICAL RESULT CALLED TO, READ BACK BY AND VERIFIED WITH: PHARMD V BRYK [phone] MLM    Enterobacter cloacae complex NOT DETECTED NOT DETECTED Final   Escherichia coli DETECTED (A) NOT DETECTED Final    Comment: CRITICAL RESULT CALLED TO, READ BACK BY AND VERIFIED WITH: PHARMD V BRYK [phone] MLM    Klebsiella oxytoca NOT DETECTED NOT DETECTED Final   Klebsiella pneumoniae NOT DETECTED NOT DETECTED Final   Proteus species NOT DETECTED NOT DETECTED Final   Serratia marcescens NOT DETECTED NOT DETECTED Final   Carbapenem resistance NOT DETECTED NOT DETECTED Final   Haemophilus influenzae NOT DETECTED NOT DETECTED Final   Neisseria meningitidis NOT DETECTED NOT DETECTED Final    Pseudomonas aeruginosa NOT DETECTED NOT DETECTED Final   Candida albicans NOT DETECTED NOT DETECTED Final   Candida glabrata NOT DETECTED NOT DETECTED Final   Candida krusei NOT DETECTED NOT DETECTED Final   Candida parapsilosis NOT DETECTED NOT DETECTED Final   Candida tropicalis NOT DETECTED NOT DETECTED Final    Comment: Performed at [facility], [place] [address], [place], [place] [address]  Culture, blood (routine x 2)     Status: Abnormal   Collection Time: 07/03/18  4:30 PM  Result Value Ref Range Status   Specimen Description BLOOD LEFT HAND  Final   Special Requests   Final    BOTTLES DRAWN AEROBIC AND ANAEROBIC Blood Culture adequate volume   Culture  Setup Time   Final    GRAM NEGATIVE RODS IN BOTH AEROBIC AND ANAEROBIC BOTTLES CRITICAL VALUE NOTED.  VALUE IS CONSISTENT WITH PREVIOUSLY REPORTED AND CALLED VALUE.    Culture (A)  Final    ESCHERICHIA COLI SUSCEPTIBILITIES PERFORMED ON PREVIOUS CULTURE WITHIN THE LAST 5 DAYS. Performed at [facility], [place] [address], [place], [place] [address]    Report Status 07/06/2018 FINAL  Final  Culture, Urine     Status: Abnormal   Collection Time: 07/03/18  5:08 PM  Result Value Ref Range Status   Specimen Description URINE, CATHETERIZED  Final   Special Requests   Final    NONE Performed at [facility], [place] [address], [place], [place] [address]    Culture (A)  Final    >=100,000 COLONIES/mL KLEBSIELLA PNEUMONIAE >=100,000 COLONIES/mL ENTEROCOCCUS FAECALIS    Report Status 07/07/2018 FINAL  Final   Organism ID, Bacteria KLEBSIELLA PNEUMONIAE (A)  Final   Organism ID, Bacteria ENTEROCOCCUS FAECALIS (A)  Final      Susceptibility   Enterococcus faecalis - MIC*    AMPICILLIN <=2 SENSITIVE Sensitive  LEVOFLOXACIN 1 SENSITIVE Sensitive     NITROFURANTOIN <=16 SENSITIVE Sensitive     VANCOMYCIN 1 SENSITIVE Sensitive     * >=100,000 COLONIES/mL ENTEROCOCCUS FAECALIS   Klebsiella pneumoniae - MIC*     AMPICILLIN >=32 RESISTANT Resistant     CEFAZOLIN <=4 SENSITIVE Sensitive     CEFTRIAXONE <=1 SENSITIVE Sensitive     CIPROFLOXACIN <=0.25 SENSITIVE Sensitive     GENTAMICIN <=1 SENSITIVE Sensitive     IMIPENEM <=0.25 SENSITIVE Sensitive     NITROFURANTOIN 64 INTERMEDIATE Intermediate     TRIMETH/SULFA <=20 SENSITIVE Sensitive     AMPICILLIN/SULBACTAM 16 INTERMEDIATE Intermediate     PIP/TAZO 16 SENSITIVE Sensitive     Extended ESBL NEGATIVE Sensitive     * >=100,000 COLONIES/mL KLEBSIELLA PNEUMONIAE  Culture, blood (routine x 2)     Status: Abnormal   Collection Time: 07/06/18  8:41 AM  Result Value Ref Range Status   Specimen Description BLOOD RIGHT ARM  Final   Special Requests   Final    BOTTLES DRAWN AEROBIC AND ANAEROBIC Blood Culture adequate volume   Culture  Setup Time   Final    GRAM POSITIVE COCCI IN BOTH AEROBIC AND ANAEROBIC BOTTLES CRITICAL RESULT CALLED TO, READ BACK BY AND VERIFIED WITH: PHARMD B MANCHERIL [phone] MLM    Culture (A)  Final    STAPHYLOCOCCUS SPECIES (COAGULASE NEGATIVE) THE SIGNIFICANCE OF ISOLATING THIS ORGANISM FROM A SINGLE SET OF BLOOD CULTURES WHEN MULTIPLE SETS ARE DRAWN IS UNCERTAIN. PLEASE NOTIFY THE MICROBIOLOGY DEPARTMENT WITHIN ONE WEEK IF SPECIATION AND SENSITIVITIES ARE REQUIRED. Performed at [facility], [place] [address], [place], [place] [address]    Report Status 07/09/2018 FINAL  Final  Blood Culture ID Panel (Reflexed)     Status: Abnormal   Collection Time: 07/06/18  8:41 AM  Result Value Ref Range Status   Enterococcus species NOT DETECTED NOT DETECTED Final   Listeria monocytogenes NOT DETECTED NOT DETECTED Final   Staphylococcus species DETECTED (A) NOT DETECTED Final    Comment: Methicillin (oxacillin) resistant coagulase negative staphylococcus. Possible blood culture contaminant (unless isolated from more than one blood culture draw or clinical case suggests pathogenicity). No antibiotic treatment is indicated for  blood  culture contaminants. CRITICAL RESULT CALLED TO, READ BACK BY AND VERIFIED WITH: PHARMD B MANCHERIL [phone] [phone]    Staphylococcus aureus (BCID) NOT DETECTED NOT DETECTED Final   Methicillin resistance DETECTED (A) NOT DETECTED Final    Comment: CRITICAL RESULT CALLED TO, READ BACK BY AND VERIFIED WITH: PHARMD B MANCHERIL [phone] MLM    Streptococcus species NOT DETECTED NOT DETECTED Final   Streptococcus agalactiae NOT DETECTED NOT DETECTED Final   Streptococcus pneumoniae NOT DETECTED NOT DETECTED Final   Streptococcus pyogenes NOT DETECTED NOT DETECTED Final   Acinetobacter baumannii NOT DETECTED NOT DETECTED Final   Enterobacteriaceae species NOT DETECTED NOT DETECTED Final   Enterobacter cloacae complex NOT DETECTED NOT DETECTED Final   Escherichia coli NOT DETECTED NOT DETECTED Final   Klebsiella oxytoca NOT DETECTED NOT DETECTED Final   Klebsiella pneumoniae NOT DETECTED NOT DETECTED Final   Proteus species NOT DETECTED NOT DETECTED Final   Serratia marcescens NOT DETECTED NOT DETECTED Final   Haemophilus influenzae NOT DETECTED NOT DETECTED Final   Neisseria meningitidis NOT DETECTED NOT DETECTED Final   Pseudomonas aeruginosa NOT DETECTED NOT DETECTED Final   Candida albicans NOT DETECTED NOT DETECTED Final   Candida glabrata NOT DETECTED NOT DETECTED Final   Candida krusei  NOT DETECTED NOT DETECTED Final   Candida parapsilosis NOT DETECTED NOT DETECTED Final   Candida tropicalis NOT DETECTED NOT DETECTED Final    Comment: Performed at [facility], [place] [address], [place], [place] [address]  Culture, blood (routine x 2)     Status: None (Preliminary result)   Collection Time: 07/06/18  8:46 AM  Result Value Ref Range Status   Specimen Description BLOOD RIGHT HAND  Final   Special Requests   Final    BOTTLES DRAWN AEROBIC ONLY Blood Culture adequate volume   Culture   Final    NO GROWTH 4 DAYS Performed at [facility], [place] [address],  [place], [place] [address]    Report Status PENDING  Incomplete  Aerobic/Anaerobic Culture (surgical/deep wound)     Status: None (Preliminary result)   Collection Time: 07/10/18  4:51 PM  Result Value Ref Range Status   Specimen Description GALL BLADDER  Final   Special Requests NONE  Final   Gram Stain   Final    MODERATE WBC PRESENT, PREDOMINANTLY PMN RARE GRAM POSITIVE RODS Performed at [facility], [place] [address] [place], [place] [address]    Culture PENDING  Incomplete   Report Status PENDING  Incomplete     Labs: Basic Metabolic Panel: Recent Labs  Lab 07/06/18 0601 07/07/18 0630 07/08/18 0522 07/09/18 0426 07/10/18 0522 07/11/18 0611  NA 138 137 138 139 139 138  K 3.7 3.6 3.5 3.6 4.1 3.8  CL 109 109 110 110 110 110  CO2 21* 20* 22 22 24 22   GLUCOSE 102* 93 128* 117* 96 103*  BUN 24* 21 17 15 17 13   CREATININE 1.67* 1.62* 1.61* 1.63* 1.60* 1.62*  CALCIUM 8.1* 8.0* 8.0* 7.9* 8.1* 8.2*  MG 2.1 1.9 2.0 2.0 2.0  --    Liver Function Tests: Recent Labs  Lab 07/07/18 0630 07/08/18 0522 07/09/18 0426 07/10/18 0522 07/11/18 0611  AST 44* 42* 48* 40 38  ALT 66* 60* 57* 50* 47*  ALKPHOS 196* 203* 215* 178* 177*  BILITOT 5.8* 5.0* 5.0* 3.3* 2.7*  PROT 5.2* 5.3* 5.1* 5.0* 5.3*  ALBUMIN 2.0* 2.0* 2.0* 2.0* 2.0*   CBC: Recent Labs  Lab 07/06/18 0601 07/07/18 0630 07/08/18 0522 07/09/18 0426 07/10/18 0522 07/11/18 0611  WBC 5.7 6.6 6.9 7.4 7.3 6.6  NEUTROABS 4.5 5.1 5.2 5.7 5.4  --   HGB 9.3* 9.9* 10.4* 10.0* 9.7* 10.1*  HCT 30.1* 30.9* 32.8* 32.0* 31.1* 32.1*  MCV 91.2 90.1 89.1 90.9 92.3 92.8  PLT 109* 154 198 181 199 216    CBG: Recent Labs  Lab 07/10/18 0729 07/10/18 1137 07/10/18 1714 07/10/18 2126 07/11/18 0739  GLUCAP 93 138* 82 161* 112*     IMAGING STUDIES Dg Chest 2 View  Result Date: 07/01/2018 CLINICAL DATA:  Shortness of breath EXAM: CHEST - 2 VIEW COMPARISON:  December 18, 2017 FINDINGS: There Is no appreciable edema or  consolidation. There is mild atelectatic change in the left base. There is no consolidation. Heart is mildly enlarged with pulmonary vascularity normal. Pacemaker leads are attached to the right atrium and right ventricle. No evident adenopathy. There is degenerative change in the thoracic spine. There is a stent in the abdominal aortic region, incompletely visualized. There is aortic atherosclerosis. IMPRESSION: Mild left base atelectasis. No edema or consolidation. Heart mildly enlarged with pacemaker leads as described. There is aortic atherosclerosis. Aortic Atherosclerosis (ICD10-I70.0). Electronically Signed   By: Lowella Grip  III M.D.   On: 07/01/2018 07:21   Dg Abd 1 View  Result Date: 07/03/2018 CLINICAL DATA:  83 year old male with vomiting and diarrhea. EXAM: ABDOMEN - 1 VIEW COMPARISON:  Chest radiographs 07/01/2018. FINDINGS: Portable AP supine view at 1850 hours. Bifurcated abdominal aortic endograft. Visualized bowel gas pattern is non obstructed. No pneumoperitoneum identified on this supine view. Small surgical clips and mesh type fast nerves project in the pelvis. No acute osseous abnormality identified. IMPRESSION: Normal visible bowel gas pattern. Abdominal aortic endograft in place. Electronically Signed   By: Genevie Ann M.D.   On: 07/03/2018 19:05   Ct Head Wo Contrast  Result Date: 07/01/2018 CLINICAL DATA:  Recent fall with left-sided headaches, initial encounter EXAM: CT HEAD WITHOUT CONTRAST TECHNIQUE: Contiguous axial images were obtained from the base of the skull through the vertex without intravenous contrast. COMPARISON:  10/15/2017 FINDINGS: Brain: Lacunar infarct is again noted in the left basal ganglia. No findings to suggest acute hemorrhage, acute infarction or space-occupying mass lesion are noted. Mild atrophic changes are noted and stable. Vascular: No hyperdense vessel or unexpected calcification. Skull: Normal. Negative for fracture or focal lesion.  Sinuses/Orbits: No acute finding. Other: None. 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 No acute intracranial abnormality is noted. Electronically Signed   By: Inez Catalina M.D.   On: 07/01/2018 08:23   Nm Hepatobiliary Liver Func  Result Date: 07/06/2018 CLINICAL DATA:  Abdominal pain with nausea and vomiting on off since 06/28/2018. Rising bilirubin. Abnormal liver function test. EXAM: NUCLEAR MEDICINE HEPATOBILIARY IMAGING TECHNIQUE: Sequential images of the abdomen were obtained out to 60 minutes following intravenous administration of radiopharmaceutical. RADIOPHARMACEUTICALS:  6.5 mCi Tc-38m  Choletec IV COMPARISON:  Ultrasound, 07/01/2018. FINDINGS: There is prompt and homogeneous accumulation of radiotracer by the liver. Despite imaging over the course of 120 minutes, no radiotracer activity was noted within the common bile duct, gallbladder or small bowel. IMPRESSION: 1. Nonvisualization of the common bile duct, gallbladder and small bowel. This is likely due to hepatocellular dysfunction with depressed biliary excretion. Central biliary obstruction is also possible. Electronically Signed   By: Lajean Manes M.D.   On: 07/06/2018 14:13   US Abdomen Complete  Addendum Date: 07/03/2018   ADDENDUM REPORT: 07/03/2018 12:11 ADDENDUM: On further review, the gallbladder contents are markedly heterogeneous with possible soft tissue component versus tumefactive sludge, and echogenic shadowing areas of gas versus calcification. If gallbladder carcinoma or emphysematous cholecystitis are clinical concerns, consider CT abdomen with contrast for further evaluation. Electronically Signed   By: Lucrezia Europe M.D.   On: 07/03/2018 12:11   Result Date: 07/03/2018 CLINICAL DATA:  Elevated LFTs EXAM: COMPLETE ABDOMINAL ULTRASOUND COMPARISON:  None available FINDINGS: Gallbladder: Intraluminal sludge and multiple calculi measuring up to 1.1 cm in the gallbladder. Normal wall thickness 2.4 mm. No  pericholecystic fluid. Sonographer describes no sonographic Murphy's sign. Common bile duct:  Normal in caliber for age, 7.58mm diameter. Liver: Homogeneous in echotexture. Mildly complex 3.3 cm cyst in the left lobe. No intrahepatic biliary ductal dilatation. Antegrade color Doppler flow signal in the main portal vein. IVC:  Negative Pancreas: Visualized segments unremarkable, portions obscured by overlying bowel gas. Spleen:  No focal lesion, craniocaudal 7.1cm in length. Right Kidney:  No mass or hydronephrosis, 12.2cm in length. Left Kidney:  No lesion or hydronephrosis, 12.2cm in length. Abdominal aorta: Native aneurysm sac 9.7 cm transverse diameter, with apparent intraluminal stent graft partially seen. IMPRESSION: 1. Cholelithiasis without other ultrasound evidence of cholecystitis or biliary  obstruction. 2. 3.3 cm complex hepatic cyst, left lobe. 3. 9.7 cm native abdominal aortic aneurysm diameter, with apparent intraluminal stent graft, incompletely evaluated. Electronically Signed: By: Lucrezia Europe M.D. On: 07/01/2018 12:59   Mr Abdomen Mrcp Wo Contrast  Result Date: 07/07/2018 CLINICAL DATA:  Jaundice, abdominal pain, fever EXAM: MRI ABDOMEN WITHOUT CONTRAST  (INCLUDING MRCP) TECHNIQUE: Multiplanar multisequence MR imaging of the abdomen was performed. Heavily T2-weighted images of the biliary and pancreatic ducts were obtained, and three-dimensional MRCP images were rendered by post processing. COMPARISON:  Right upper quadrant ultrasound dated 07/01/2018 FINDINGS: Motion degraded images. Lower chest: Small bilateral pleural effusions. Hepatobiliary: No morphologic findings of cirrhosis. 2.9 cm cyst in segment 4B (series 4/image 13). Mild intrahepatic and extrahepatic ductal dilatation. Multiple common duct stones, measuring up to 12 mm in the central common duct (series 13/image 6). Distended, irregular gallbladder with numerous layering gallstones and mild gallbladder wall thickening. Given the  associated findings and irregular appearance of the gallbladder wall, this appearance is worrisome for acute cholecystitis. Pancreas:  Within normal limits. Spleen:  Within normal limits. Adrenals/Urinary Tract:  Adrenal glands are within normal limits. Kidneys are within normal limits.  No hydronephrosis. Stomach/Bowel: Stomach is notable for a tiny hiatal hernia. Duodenal diverticulum (series 4/image 25) along the posterior aspect of the 2nd/3rd portion of the duodenum. Visualized bowel is otherwise grossly unremarkable. Vascular/Lymphatic: 7.6 x 9.6 cm infrarenal abdominal aortic aneurysm (series 4/image 33), incompletely visualized. Suspected indwelling aorto bi-iliac stent. No suspicious abdominal lymphadenopathy. Other:  No abdominal ascites. Musculoskeletal: No focal osseous lesions. IMPRESSION: Distended, irregular gallbladder with cholelithiasis and gallbladder wall thickening, favoring acute cholecystitis. Associated intrahepatic and extrahepatic dilatation with multiple common duct stones measuring up to 12 mm. 7.6 x 9.6 cm infrarenal abdominal aortic aneurysm, incompletely visualized. Suspected aorto bi-iliac stents. Electronically Signed   By: Julian Hy M.D.   On: 07/07/2018 15:22   Mr 3d Recon At Scanner  Result Date: 07/07/2018 CLINICAL DATA:  Jaundice, abdominal pain, fever EXAM: MRI ABDOMEN WITHOUT CONTRAST  (INCLUDING MRCP) TECHNIQUE: Multiplanar multisequence MR imaging of the abdomen was performed. Heavily T2-weighted images of the biliary and pancreatic ducts were obtained, and three-dimensional MRCP images were rendered by post processing. COMPARISON:  Right upper quadrant ultrasound dated 07/01/2018 FINDINGS: Motion degraded images. Lower chest: Small bilateral pleural effusions. Hepatobiliary: No morphologic findings of cirrhosis. 2.9 cm cyst in segment 4B (series 4/image 13). Mild intrahepatic and extrahepatic ductal dilatation. Multiple common duct stones, measuring up to 12 mm  in the central common duct (series 13/image 6). Distended, irregular gallbladder with numerous layering gallstones and mild gallbladder wall thickening. Given the associated findings and irregular appearance of the gallbladder wall, this appearance is worrisome for acute cholecystitis. Pancreas:  Within normal limits. Spleen:  Within normal limits. Adrenals/Urinary Tract:  Adrenal glands are within normal limits. Kidneys are within normal limits.  No hydronephrosis. Stomach/Bowel: Stomach is notable for a tiny hiatal hernia. Duodenal diverticulum (series 4/image 25) along the posterior aspect of the 2nd/3rd portion of the duodenum. Visualized bowel is otherwise grossly unremarkable. Vascular/Lymphatic: 7.6 x 9.6 cm infrarenal abdominal aortic aneurysm (series 4/image 33), incompletely visualized. Suspected indwelling aorto bi-iliac stent. No suspicious abdominal lymphadenopathy. Other:  No abdominal ascites. Musculoskeletal: No focal osseous lesions. IMPRESSION: Distended, irregular gallbladder with cholelithiasis and gallbladder wall thickening, favoring acute cholecystitis. Associated intrahepatic and extrahepatic dilatation with multiple common duct stones measuring up to 12 mm. 7.6 x 9.6 cm infrarenal abdominal aortic aneurysm, incompletely visualized. Suspected aorto bi-iliac stents.  Electronically Signed   By: Julian Hy M.D.   On: 07/07/2018 15:22   Ir Perc Cholecystostomy  Result Date: 07/11/2018 INDICATION: Acute cholecystitis EXAM: CHOLECYSTOSTOMY MEDICATIONS: The patient is currently on Levaquin ANESTHESIA/SEDATION: Fentanyl 50 mcg IV; Versed 1 mg IV Moderate Sedation Time:  6 minutes The patient was continuously monitored during the procedure by the interventional radiology nurse under my direct supervision. FLUOROSCOPY TIME:  Fluoroscopy Time:  minutes 24 seconds (3 mGy). COMPLICATIONS: None immediate. PROCEDURE: Informed written consent was obtained from the patient after a thorough  discussion of the procedural risks, benefits and alternatives. All questions were addressed. Maximal Sterile Barrier Technique was utilized including caps, mask, sterile gowns, sterile gloves, sterile drape, hand hygiene and skin antiseptic. A timeout was performed prior to the initiation of the procedure. The right upper quadrant was prepped with ChloraPrep in a sterile fashion, and a sterile drape was applied covering the operative field. A sterile gown and sterile gloves were used for the procedure. A 21 gauge needle was inserted into the gallbladder lumen under sonographic guidance and via transhepatic approach. It was removed over a 018 wire, which was upsized to a 3-J. A 10-French drain was advanced over the wire and coiled in the gallbladder lumen. It was sewn to the skin after being string fixed. Frank pus was aspirated. FINDINGS: The cystic duct is occluded. Final imaging demonstrates a 10 French drain coiled in the lumen of the gallbladder. IMPRESSION: Successful cholecystostomy. This needs to remain in place at least 6 weeks. Electronically Signed   By: Marybelle Killings M.D.   On: 07/11/2018 07:52   Dg Ercp Biliary & Pancreatic Ducts  Result Date: 07/09/2018 CLINICAL DATA:  Common bile duct stone EXAM: ERCP TECHNIQUE: Multiple spot images obtained with the fluoroscopic device and submitted for interpretation post-procedure. FLUOROSCOPY TIME:  Fluoroscopy Time: Reported as 8 minutes, 24 seconds COMPARISON:  07/07/2018 MRI FINDINGS: Endoscopy and ERCP or performed by Dr. Oretha Caprice. I was called up to endoscopy and reviewed the images with him while the procedure was underway. There is prominent intrahepatic biliary dilatation along with filling defects in the common hepatic duct compatible with stones. We also fill a significant portion of the cystic duct which is also clearly filled with large stones. The cystic duct stones are thought to likely be causing extrinsic mass effect (Mirizzi syndrome) on the  common hepatic duct and common bile duct, which prevents balloon extraction of the common hepatic duct stones. Despite attempts. The final image demonstrates the biliary stent that Dr. Ardis Hughs placed traversing the obstruction. IMPRESSION: 1. 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 The final image demonstrates a stent extending from the dilated intrahepatic biliary system across the stenosis caused by the stones and extending towards the duodenum. These images were submitted for radiologic interpretation only. Please see the procedural report for the amount of contrast and the fluoroscopy time utilized. Electronically Signed   By: Van Clines M.D.   On: 07/09/2018 11:33   US Liver Doppler  Result Date: 07/03/2018 CLINICAL DATA:  Elevated LFTs. History of syncope and elevated troponins. EXAM: DUPLEX ULTRASOUND OF LIVER TECHNIQUE: Color and duplex Doppler ultrasound was performed to evaluate the hepatic in-flow and out-flow vessels. COMPARISON:  Abdominal ultrasound 07/01/2018 FINDINGS: Liver: Liver contour  adjacent to the right kidney is slightly nodular. Again noted is a mildly complex hepatic cyst measuring roughly 3.1 cm. Liver parenchyma is mildly heterogeneous. Portal Vein Velocities Main:  41 cm/sec Right:  25 cm/sec Left:  22 cm/sec Hepatic Vein Velocities Right:  47 cm/sec Middle:  47 cm/sec Left:  44 cm/sec IVC: Present and patent with normal respiratory phasicity. Hepatic Artery Velocity:  140 cm/sec Splenic Vein Velocity:  46 cm/sec Varices: Absent Ascites: Absent Again noted is an abnormal gallbladder. Gallbladder has echogenic foci with areas of shadowing. Although the findings could be related to irregular gallstones, the appearance raises concern for  underlying inflammation or neoplasm. Portal vein demonstrates normal hepatopetal flow towards the liver. Main portal vein measures roughly 1.2 cm. Spleen size is upper limits of normal with a length of 10.4 cm. Normal hepatofugal flow in the hepatic veins. Splenic vein is patent. Probable splenule near the splenic hilum measuring up to 2.4 cm. IMPRESSION: 1. Portal venous system is patent with normal direction of flow. 2. Abnormal gallbladder. Suspect there are stones or calcifications but recommend further characterization with a post contrast CT to further characterize the gallbladder for an inflammatory or neoplastic process. 3. Nodular contour of the liver and findings could be related to cirrhosis. This finding could also be further characterized with cross-sectional imaging. Spleen size is upper limits of normal. These results will be called to the ordering clinician or representative by the Radiologist Assistant, and communication documented in the PACS or zVision Dashboard. Electronically Signed   By: [name] Daft M.D.   On: 07/03/2018 12:26    DISCHARGE EXAMINATION: Vitals:   07/11/18 0022 07/11/18 0640 07/11/18 0814 07/11/18 1202  BP: (!) 153/69 (!) 160/78 (!) 146/67 126/82  Pulse: 64 66 63 94  Resp:  18 (!) 21 20  Temp:  (!) 97.5 F (36.4 C) 98.2 F (36.8 C) 98.3 F (36.8 C)  TempSrc:  Oral Oral Oral  SpO2:  95% 95% 94%  Weight:  100.8 kg    Height:       General appearance: alert, cooperative, appears stated age and no distress Resp: clear to auscultation bilaterally Cardio: regular rate and rhythm, S1, S2 normal, no murmur, click, rub or gallop GI: Percutaneous cholecystotomy drain noted on the right.  Abdomen nontender.  DISPOSITION: Home with family  Discharge Instructions    Call MD for:  difficulty breathing, headache or visual disturbances   Complete by:  As directed    Call MD for:  extreme fatigue   Complete by:  As directed    Call MD for:  persistant dizziness or  light-headedness   Complete by:  As directed    Call MD for:  persistant nausea and vomiting   Complete by:  As directed    Call MD for:  severe uncontrolled pain   Complete by:  As directed    Call MD for:  temperature >100.4   Complete by:  As directed    Diet - low sodium heart healthy   Complete by:  As directed    Discharge instructions   Complete by:  As directed    Please follow instructions provided by the surgery and interventional radiology team.  Keep your follow-up appointments.  Your heart doctor's office will call you to set up follow-up appointment.  You will need to see the urologist as well.  You were cared for by a hospitalist during your hospital stay. If you have any questions about your discharge medications or the  care you received while you were in the hospital after you are discharged, you can call the unit and asked to speak with the hospitalist on call if the hospitalist that took care of you is not available. Once you are discharged, your primary care physician will handle any further medical issues. Please note that NO REFILLS for any discharge medications will be authorized once you are discharged, as it is imperative that you return to your primary care physician (or establish a relationship with a primary care physician if you do not have one) for your aftercare needs so that they can reassess your need for medications and monitor your lab values. If you do not have a primary care physician, you can call [phone] for a physician referral.   Increase activity slowly   Complete by:  As directed         Allergies as of 07/11/2018      Reactions   Penicillins Other (See Comments)   Patient can't remember reaction was told by mother he had a SERIOUS allergic rxn as a child  Has pt had a PCN rxn causing immediate rash, facial/tongue/throat swelling, SOB or lightheadedness with hypotension: Unknown Has patient had a PCN rxn causing severe rash involving mucus  membranes or skin necrosis: Unknown Has pt had a PCN rxn that required hospitalization: Unknown Has pt had a PCN reaction occurring within the last 10 years: No If all of the above answers are "NO", then may pro      Medication List    STOP taking these medications   Red Yeast Rice 600 MG Caps     TAKE these medications   apixaban 2.5 MG Tabs tablet Commonly known as:  ELIQUIS Take 1 tablet (2.5 mg total) by mouth 2 (two) times daily.   diltiazem 180 MG 24 hr capsule Commonly known as:  CARDIZEM CD Take 1 capsule (180 mg total) by mouth daily.   docusate sodium 100 MG capsule Commonly known as:  COLACE Take 1 capsule (100 mg total) by mouth 2 (two) times daily.   furosemide 40 MG tablet Commonly known as:  LASIX TAKE 1 TABLET BY MOUTH EVERY DAY   levofloxacin 500 MG tablet Commonly known as:  LEVAQUIN Take 1 tablet (500 mg total) by mouth daily for 8 days.   metoprolol succinate 25 MG 24 hr tablet Commonly known as:  TOPROL-XL Take 1 tablet (25 mg total) by mouth 2 (two) times daily. What changed:    medication strength  See the new instructions.   pantoprazole 40 MG tablet Commonly known as:  PROTONIX Take 1 tablet (40 mg total) by mouth 2 (two) times daily.   tamsulosin 0.4 MG Caps capsule Commonly known as:  FLOMAX Take 1 capsule (0.4 mg total) by mouth daily after supper. What changed:  when to take this   Vitamin D3 2000 units Tabs Take 2,000 Units by mouth daily.        Follow-up Information    Mansouraty, Telford Nab., MD Follow up on 08/02/2018.   Specialties:  Gastroenterology, Internal Medicine Why:  2:45 pm  Contact information: [place] [address] [phone]        Marybelle Killings, MD. Call.   Specialties:  Interventional Radiology, Radiology Why:  Call with any questions or concerns. Contact information: [place] [address] [phone]        Erroll Luna, MD Follow up in 6  week(s).   Specialty:  General  Surgery Why:  our office will call you with appointment date and time. Contact information: [address] [place] [address] [phone]        Lucas Mallow, MD. Schedule an appointment as soon as possible for a visit in 1 week.   Specialty:  Urology Why:  Please call office  Contact information: [place] [address] [phone]        Evans Lance, MD. Go on 07/24/2018.   Specialty:  Cardiology Why:  his office will call for follow up.@10 :40am Contact information: [address] [place] [address] [phone]           TOTAL DISCHARGE TIME: 35 mins  Bonnielee Haff  [facility] Pager [phone]  07/11/2018, 2:07 PM

## 2018-07-11 NOTE — Progress Notes (Signed)
CM talked to patient at the bedside with his daughter and son at the bedside; he does not want a HHRN at this time; his daughter stated that she plans to take care of his drain and the nurse has been training her on it. Mindi Slicker [facility] [phone]

## 2018-07-11 NOTE — Progress Notes (Signed)
Patient ready for discharge. Patient requested a leg drainage bag for foley, bags switched out.

## 2018-07-15 ENCOUNTER — Other Ambulatory Visit: Payer: Self-pay | Admitting: Surgery

## 2018-07-15 DIAGNOSIS — K810 Acute cholecystitis: Secondary | ICD-10-CM

## 2018-07-15 LAB — AEROBIC/ANAEROBIC CULTURE W GRAM STAIN (SURGICAL/DEEP WOUND)

## 2018-07-15 LAB — AEROBIC/ANAEROBIC CULTURE (SURGICAL/DEEP WOUND)

## 2018-07-16 DIAGNOSIS — Z8546 Personal history of malignant neoplasm of prostate: Secondary | ICD-10-CM | POA: Diagnosis not present

## 2018-07-16 DIAGNOSIS — R338 Other retention of urine: Secondary | ICD-10-CM | POA: Diagnosis not present

## 2018-07-18 ENCOUNTER — Encounter: Payer: Self-pay | Admitting: Gastroenterology

## 2018-07-18 ENCOUNTER — Telehealth: Payer: Self-pay

## 2018-07-18 MED FILL — NORMAL SALINE FLUSH SYRINGE: 0.9 | 35 days supply | Qty: 350 | Fill #0

## 2018-07-18 NOTE — Telephone Encounter (Signed)
appt made for 11/22

## 2018-07-18 NOTE — Telephone Encounter (Signed)
-----   Message from Milus Banister, MD sent at 07/18/2018  5:48 AM EST ----- Regarding: RE: [facility] I think I'd like to follow him.  Elizabeth Haff, can you put him in for rov with me in next 3-4 weeks?  Thanks for checking  DJ  ----- Message ----- From: Irving Copas., MD Sent: 07/18/2018   1:05 AM EST To: Milus Banister, MD, Timothy Lasso, RN Subject: [facility] patient                                 Dear Linna Hoff, I hope you are well. This is the patient you did recent ERCP on and ended up getting a percutaneous drain in place. Since you ended up doing his ERCP, even though I did EUS, would you like to follow him up in clinic or do you want me to take over? He has Barrett's esophagus on his biopsies, but obviously has bigger fish to fry currently. Just let myself or Jaquari Reckner know as I think he has follow up with me scheduled in November. Thanks. Chester Holstein

## 2018-07-24 ENCOUNTER — Encounter: Payer: Self-pay | Admitting: Nurse Practitioner

## 2018-07-24 ENCOUNTER — Ambulatory Visit (INDEPENDENT_AMBULATORY_CARE_PROVIDER_SITE_OTHER): Payer: Medicare Other | Admitting: Nurse Practitioner

## 2018-07-24 VITALS — BP 122/66 | HR 65 | Ht 70.0 in | Wt 214.0 lb

## 2018-07-24 DIAGNOSIS — I495 Sick sinus syndrome: Secondary | ICD-10-CM

## 2018-07-24 DIAGNOSIS — Z01818 Encounter for other preprocedural examination: Secondary | ICD-10-CM

## 2018-07-24 DIAGNOSIS — I480 Paroxysmal atrial fibrillation: Secondary | ICD-10-CM | POA: Diagnosis not present

## 2018-07-24 DIAGNOSIS — R7881 Bacteremia: Secondary | ICD-10-CM | POA: Diagnosis not present

## 2018-07-24 LAB — CUP PACEART INCLINIC DEVICE CHECK
Implantable Lead Implant Date: 20190408
Implantable Lead Location: 753860
MDC IDC LEAD IMPLANT DT: 20190408
MDC IDC LEAD LOCATION: 753859
MDC IDC PG IMPLANT DT: 20190408
MDC IDC SESS DTM: 20191113104120
Pulse Gen Model: 2272
Pulse Gen Serial Number: 9004195

## 2018-07-24 LAB — CBC
HEMATOCRIT: 35.7 % — AB (ref 37.5–51.0)
Hemoglobin: 12 g/dL — ABNORMAL LOW (ref 13.0–17.7)
MCH: 29.3 pg (ref 26.6–33.0)
MCHC: 33.6 g/dL (ref 31.5–35.7)
MCV: 87 fL (ref 79–97)
PLATELETS: 239 10*3/uL (ref 150–450)
RBC: 4.09 x10E6/uL — ABNORMAL LOW (ref 4.14–5.80)
RDW: 13.9 % (ref 12.3–15.4)
WBC: 7.1 10*3/uL (ref 3.4–10.8)

## 2018-07-24 NOTE — Progress Notes (Signed)
Electrophysiology Office Note Date: 07/24/2018  ID:  Ernest Parker, DOB January 04, 1934, MRN [id number]  PCP: Seward Carol, MD Electrophysiologist: Lovena Le  CC: Pacemaker/hospital follow-up  Ernest Parker is a 84 y.o. male seen today for Dr Lovena Le.  He was recently admitted with GI bleed and gram negative bacteremia.  He was seen by cardiology that admission. Troponin was elevated and felt to be due to demand ischemia.   He presents today for routine electrophysiology followup.  Since discharge, the patient reports doing relatively well. He is anxious to have gall bladder removed to get rid of drain.  He denies chest pain, palpitations, dyspnea (above baseline), PND, orthopnea, nausea, vomiting, dizziness, syncope, edema, weight gain, or early satiety.  Device History: STJ dual chamber PPM implanted 2019 for SSS   Past Medical History:  Diagnosis Date  . A-fib ([place])   . Dizzy   . Dyspnea   . GERD (gastroesophageal reflux disease)   . Hard of hearing    wears hearing aids bilat  . High cholesterol   . Hypertension   . Presence of permanent cardiac pacemaker 12/17/2017  . Prostate cancer ([place]) 2002   S/P "radiation for 8-9 weeks"  . SOB (shortness of breath)    Past Surgical History:  Procedure Laterality Date  . BILIARY STENT PLACEMENT  07/09/2018   Procedure: BILIARY STENT PLACEMENT;  Surgeon: Milus Banister, MD;  Location: [facility] ENDOSCOPY;  Service: Endoscopy;;  . BIOPSY  07/05/2018   Procedure: BIOPSY;  Surgeon: Irving Copas., MD;  Location: [facility] ENDOSCOPY;  Service: Gastroenterology;;  . ERCP N/A 07/09/2018   Procedure: ENDOSCOPIC RETROGRADE CHOLANGIOPANCREATOGRAPHY (ERCP);  Surgeon: Milus Banister, MD;  Location: [facility] ENDOSCOPY;  Service: Endoscopy;  Laterality: N/A;  . ESOPHAGOGASTRODUODENOSCOPY (EGD) WITH PROPOFOL N/A 07/05/2018   Procedure: ESOPHAGOGASTRODUODENOSCOPY (EGD) WITH PROPOFOL;  Surgeon: Rush Landmark Telford Nab., MD;  Location: [place];  Service:  Gastroenterology;  Laterality: N/A;  . EUS  07/05/2018   Procedure: FULL UPPER ENDOSCOPIC ULTRASOUND (EUS) RADIAL;  Surgeon: Rush Landmark Telford Nab., MD;  Location: [place];  Service: Gastroenterology;;  . INGUINAL HERNIA REPAIR Bilateral   . INSERT / REPLACE / REMOVE PACEMAKER  12/17/2017  . IR PERC CHOLECYSTOSTOMY  07/10/2018  . PACEMAKER IMPLANT N/A 12/17/2017   Procedure: PACEMAKER IMPLANT;  Surgeon: Evans Lance, MD;  Location: [place] CV LAB;  Service: Cardiovascular;  Laterality: N/A;  . PROSTATE BIOPSY    . REMOVAL OF STONES  07/09/2018   Procedure: REMOVAL OF STONES;  Surgeon: Milus Banister, MD;  Location: [facility] ENDOSCOPY;  Service: Endoscopy;;  . [name]  07/09/2018   Procedure: [name];  Surgeon: Milus Banister, MD;  Location: [place];  Service: Endoscopy;;  . THORACIC AORTIC ANEURYSM REPAIR      Current Outpatient Medications  Medication Sig Dispense Refill  . apixaban (ELIQUIS) 2.5 MG TABS tablet Take 1 tablet (2.5 mg total) by mouth 2 (two) times daily. 60 tablet 11  . Cholecalciferol (VITAMIN D3) 2000 units TABS Take 2,000 Units by mouth daily.     Marland Kitchen diltiazem (CARDIZEM CD) 180 MG 24 hr capsule Take 1 capsule (180 mg total) by mouth daily. 90 capsule 3  . docusate sodium (COLACE) 100 MG capsule Take 1 capsule (100 mg total) by mouth 2 (two) times daily. 60 capsule 0  . furosemide (LASIX) 40 MG tablet TAKE 1 TABLET BY MOUTH EVERY DAY 90 tablet 3  . levofloxacin (LEVAQUIN) 500 MG tablet Take 500 mg by mouth daily.    Marland Kitchen  metoprolol succinate (TOPROL-XL) 25 MG 24 hr tablet Take 1 tablet (25 mg total) by mouth 2 (two) times daily. 60 tablet 0  . pantoprazole (PROTONIX) 40 MG tablet Take 1 tablet (40 mg total) by mouth 2 (two) times daily. 60 tablet 0  . tamsulosin (FLOMAX) 0.4 MG CAPS capsule Take 1 capsule (0.4 mg total) by mouth daily after supper. (Patient taking differently: Take 0.4 mg by mouth 2 (two) times daily. ) 30 capsule 0   No current  facility-administered medications for this visit.     Allergies:   Penicillins   Social History: Social History   Socioeconomic History  . Marital status: Widowed    Spouse name: Not on file  . Number of children: Not on file  . Years of education: Not on file  . Highest education level: Not on file  Occupational History  . Occupation: RETIRED  Social Needs  . Financial resource strain: Not on file  . Food insecurity:    Worry: Not on file    Inability: Not on file  . Transportation needs:    Medical: Not on file    Non-medical: Not on file  Tobacco Use  . Smoking status: Former Smoker    Packs/day: 0.50    Years: 30.00    Pack years: 15.00    Types: Cigarettes    Last attempt to quit: 1980    Years since quitting: 39.8  . Smokeless tobacco: Never Used  Substance and Sexual Activity  . Alcohol use: Yes    Alcohol/week: 1.0 standard drinks    Types: 1 Cans of beer per week    Comment: rare  . Drug use: No  . Sexual activity: Not on file  Lifestyle  . Physical activity:    Days per week: Not on file    Minutes per session: Not on file  . Stress: Not on file  Relationships  . Social connections:    Talks on phone: Not on file    Gets together: Not on file    Attends religious service: Not on file    Active member of club or organization: Not on file    Attends meetings of clubs or organizations: Not on file    Relationship status: Not on file  . Intimate partner violence:    Fear of current or ex partner: Not on file    Emotionally abused: Not on file    Physically abused: Not on file    Forced sexual activity: Not on file  Other Topics Concern  . 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 Lives alone during the summer in [place] and lives with daughter during the Winter in [place], moving to [place]. Has 1 daughter and 2 sons. One sone lives in [place] and one in [place].   Is an avid  golfer, plays 4-5 days per week. Likes to do yardwork.     Family History: Family History  Problem Relation Age of Onset  . Hypertension Mother   . Tuberculosis Father   . Kidney disease Sister   . Sleep apnea Sister      Review of Systems: All other systems reviewed and are otherwise negative except as noted above.   Physical Exam: VS:  BP 122/66   Pulse 65   Ht 5\' 10"  (1.778 m)   Wt 214 lb (97.1 kg)   SpO2 95%   BMI 30.71 kg/m  ,  BMI Body mass index is 30.71 kg/m.  GEN- The patient is elderly appearing, alert and oriented x 3 today.   HEENT: normocephalic, atraumatic; sclera clear, conjunctiva pink; hearing intact; oropharynx clear; neck supple  Lungs- Clear to ausculation bilaterally, normal work of breathing.  No wheezes, rales, rhonchi Heart- Regular rate and rhythm  GI- soft, non-tender, non-distended, bowel sounds present  Extremities- no clubbing, cyanosis, or edema  MS- no significant deformity or atrophy Skin- warm and dry, no rash or lesion; PPM pocket well healed Psych- euthymic mood, full affect Neuro- strength and sensation are intact  PPM Interrogation- reviewed in detail today,  See PACEART report  EKG:  EKG is not ordered today.  Recent Labs: 07/01/2018: TSH 0.897 07/10/2018: Magnesium 2.0 07/11/2018: ALT 47; BUN 13; Creatinine, Ser 1.62; Hemoglobin 10.1; Platelets 216; Potassium 3.8; Sodium 138   Wt Readings from Last 3 Encounters:  07/24/18 214 lb (97.1 kg)  07/11/18 222 lb 3.2 oz (100.8 kg)  04/10/18 213 lb 6.4 oz (96.8 kg)     Other studies Reviewed: Additional studies/ records that were reviewed today include: Dr Tanna Furry notes, hospital records    Assessment and Plan:  1.  Symptomatic bradycardia Normal PPM function See Pace Art report No changes today  2.  Persistent atrial fibrillation Burden by device interrogation 10% Continue Eliquis for CHADS2VASC of 3 CBC today  3.  E. Coli bacteremia He has completed antibiotics No  recent fevers/chills  4.  Surgical clearance Pt is at acceptable risk for gall bladder removal as well as prostate surgery (see previous clearance note).  Ok to hold Eliquis as needed around time of procedures.  Would resume when able post surgery     Current medicines are reviewed at length with the patient today.   The patient does not have concerns regarding his medicines.  The following changes were made today:  none  Labs/ tests ordered today include: CBC Orders Placed This Encounter  Procedures  . CBC  . CUP PACEART INCLINIC DEVICE CHECK     Disposition:   Follow up with Delilah Shan, Dr Lovena Le 6 months    Signed, Chanetta Marshall, NP 07/24/2018 11:17 AM  [facility] [address] [place] [address] [phone] (office) [phone] (fax)

## 2018-07-24 NOTE — Patient Instructions (Addendum)
Medication Instructions:  NONE ORDERED  TODAY  If you need a refill on your cardiac medications before your next appointment, please call your pharmacy.   Lab work: CBC TODAY   If you have labs (blood work) drawn today and your tests are completely normal, you will receive your results only by: Marland Kitchen MyChart Message (if you have MyChart) OR . A paper copy in the mail If you have any lab test that is abnormal or we need to change your treatment, we will call you to review the results.  Testing/Procedures: NONE ORDERED  TODAY   Follow-Up: At [facility], you and your health needs are our priority.  As part of our continuing mission to provide you with exceptional heart care, we have created designated Provider Care Teams.  These Care Teams include your primary Cardiologist (physician) and Advanced Practice Providers (APPs -  Physician Assistants and Nurse Practitioners) who all work together to provide you with the care you need, when you need it. You will need a follow up appointment in 6 months.  Please call our office 2 months in advance to schedule this appointment.  You may see Dr. Lovena Le or one of the following Advanced Practice Providers on your designated Care Team:   Chanetta Marshall, NP . Tommye Standard, PA-C  Any Other Special Instructions Will Be Listed Below (If Applicable).

## 2018-07-29 ENCOUNTER — Telehealth: Payer: Self-pay

## 2018-07-29 NOTE — Telephone Encounter (Signed)
-----   Message from Patsey Berthold, NP sent at 07/27/2018  4:08 PM EST ----- Please notify patient of stable labs. Thanks!

## 2018-07-29 NOTE — Telephone Encounter (Signed)
Notes recorded by Frederik Schmidt, RN on 07/29/2018 at 8:51 AM EST The patient has been notified of the result and verbalized understanding. All questions (if any) were answered. Frederik Schmidt, RN 07/29/2018 8:51 AM

## 2018-07-30 DIAGNOSIS — R338 Other retention of urine: Secondary | ICD-10-CM | POA: Diagnosis not present

## 2018-08-02 ENCOUNTER — Telehealth: Payer: Self-pay

## 2018-08-02 ENCOUNTER — Ambulatory Visit: Payer: Medicare Other | Admitting: Gastroenterology

## 2018-08-02 NOTE — Telephone Encounter (Signed)
I spoke with the Ernest Parker and rescheduled him with Dr Ardis Hughs for 08/13/18.  His appt with Dr Rush Landmark has been cancelled.

## 2018-08-02 NOTE — Telephone Encounter (Signed)
-----   Message from Milus Banister, MD sent at 08/01/2018  7:13 PM EST ----- Regarding: RE: Patient Follow up Agree, I would like to follow him.  Thanks  dj  ----- Message ----- From: Irving Copas., MD Sent: 08/01/2018   5:00 PM EST To: Milus Banister, MD, Timothy Lasso, RN Subject: Patient Follow up                              Dear Chong Sicilian and Linna Hoff, I think this patient got set up for a follow up with me, but when we had last discussed things, we had said that Linna Hoff would be following up the patient. He is scheduled for me in the later afternoon, so I think if we reach him in the AM, then he can be rescheduled with Linna Hoff. If any issues, happy to see him, but as you did his ERCP I think best for follow up with you. Thanks. Chester Holstein

## 2018-08-12 ENCOUNTER — Other Ambulatory Visit: Payer: Self-pay | Admitting: Internal Medicine

## 2018-08-13 ENCOUNTER — Other Ambulatory Visit (INDEPENDENT_AMBULATORY_CARE_PROVIDER_SITE_OTHER): Payer: Medicare Other

## 2018-08-13 ENCOUNTER — Encounter: Payer: Self-pay | Admitting: Gastroenterology

## 2018-08-13 ENCOUNTER — Other Ambulatory Visit (HOSPITAL_COMMUNITY): Payer: Self-pay | Admitting: Diagnostic Radiology

## 2018-08-13 ENCOUNTER — Ambulatory Visit (INDEPENDENT_AMBULATORY_CARE_PROVIDER_SITE_OTHER): Payer: Medicare Other | Admitting: Gastroenterology

## 2018-08-13 VITALS — BP 118/68 | HR 64 | Ht 70.0 in | Wt 219.4 lb

## 2018-08-13 DIAGNOSIS — K8050 Calculus of bile duct without cholangitis or cholecystitis without obstruction: Secondary | ICD-10-CM

## 2018-08-13 DIAGNOSIS — K810 Acute cholecystitis: Secondary | ICD-10-CM

## 2018-08-13 LAB — COMPREHENSIVE METABOLIC PANEL
ALBUMIN: 3.9 g/dL (ref 3.5–5.2)
ALK PHOS: 134 U/L — AB (ref 39–117)
ALT: 153 U/L — AB (ref 0–53)
AST: 115 U/L — AB (ref 0–37)
BUN: 23 mg/dL (ref 6–23)
CO2: 28 mEq/L (ref 19–32)
Calcium: 9.3 mg/dL (ref 8.4–10.5)
Chloride: 100 mEq/L (ref 96–112)
Creatinine, Ser: 1.71 mg/dL — ABNORMAL HIGH (ref 0.40–1.50)
GFR: 40.85 mL/min — ABNORMAL LOW (ref >60.00)
GLUCOSE: 129 mg/dL — AB (ref 70–99)
Potassium: 3.8 mEq/L (ref 3.5–5.1)
SODIUM: 136 meq/L (ref 135–145)
TOTAL PROTEIN: 7.7 g/dL (ref 6.0–8.3)
Total Bilirubin: 1.2 mg/dL (ref 0.2–1.2)

## 2018-08-13 LAB — CBC WITH DIFFERENTIAL/PLATELET
BASOS ABS: 0.1 10*3/uL (ref 0.0–0.1)
Basophils Relative: 0.9 % (ref 0.0–3.0)
Eosinophils Absolute: 0.6 10*3/uL (ref 0.0–0.7)
Eosinophils Relative: 6.5 % — ABNORMAL HIGH (ref 0.0–5.0)
HCT: 37.4 % — ABNORMAL LOW (ref 39.0–52.0)
Hemoglobin: 12.4 g/dL — ABNORMAL LOW (ref 13.0–17.0)
LYMPHS ABS: 1.6 10*3/uL (ref 0.7–4.0)
Lymphocytes Relative: 17.6 % (ref 12.0–46.0)
MCHC: 33.2 g/dL (ref 30.0–36.0)
MCV: 90.1 fl (ref 78.0–100.0)
MONO ABS: 0.6 10*3/uL (ref 0.1–1.0)
MONOS PCT: 6.9 % (ref 3.0–12.0)
NEUTROS PCT: 68.1 % (ref 43.0–77.0)
Neutro Abs: 6.3 10*3/uL (ref 1.4–7.7)
Platelets: 214 10*3/uL (ref 150.0–400.0)
RBC: 4.16 Mil/uL — AB (ref 4.22–5.81)
RDW: 15.5 % (ref 11.5–15.5)
WBC: 9.2 10*3/uL (ref 4.0–10.5)

## 2018-08-13 MED ORDER — LEVOFLOXACIN 500 MG PO TABS: 500.0000 mg | ORAL_TABLET | Freq: Every day | ORAL | 0 refills | Status: DC

## 2018-08-13 MED ORDER — METOPROLOL SUCCINATE ER 25 MG PO TB24: 25.0000 mg | ORAL_TABLET | Freq: Two times a day (BID) | ORAL | 3 refills | Status: DC

## 2018-08-13 NOTE — Progress Notes (Signed)
Review of pertinent gastrointestinal problems: 1.  Cholangitis, cholecystitis October 2019.  Eventual ERCP July 09, 2018 found multiple common bile duct stones, multiple cystic duct stones, suspicion of Mirizzi compression, ampulla was within a diverticulum, sphincterotomy performed, partial stone removal, placement of a 8.5 French 10 cm long plastic biliary stent, + purulent cholangitis.  Interventional radiology perc cholecystostomy tube placed 07/11/2018, + pus in GB.  HPI: This is a very pleasant 84 year old man whom I met while he was acutely ill with cholangitis, cholecystitis about a month and a half ago.  He eventually underwent ERCP with biliary stent placement, removal of about one half of his choledocholithiasis burden and also underwent interventional radiology percutaneous cholecystotomy placement.  Since he left the hospital he completed a 10-day or so course of oral Levaquin.  He has not had follow-up appointment with either surgery or interventional radiology.  He is scheduled for a drain check with interventional radiology in about 8 days from now.  He has had no fevers or chills but he has noticed that his percutaneous cholecystotomy drain bag volume has increased and is become more murky over the past day or 2.  Chief complaint is complex cholecystitis, cholangitis  ROS: complete GI ROS as described in HPI, all other review negative.  Constitutional:  No unintentional weight loss   Past Medical History:  Diagnosis Date  . A-fib ([place])   . Barrett's esophagus   . Dizzy   . Dyspnea   . GERD (gastroesophageal reflux disease)   . Hard of hearing    wears hearing aids bilat  . High cholesterol   . Hypertension   . Presence of permanent cardiac pacemaker 12/17/2017  . Prostate cancer ([place]) 2002   S/P "radiation for 8-9 weeks"  . SOB (shortness of breath)     Past Surgical History:  Procedure Laterality Date  . BILIARY STENT PLACEMENT  07/09/2018   Procedure:  BILIARY STENT PLACEMENT;  Surgeon: Milus Banister, MD;  Location: [facility] ENDOSCOPY;  Service: Endoscopy;;  . BIOPSY  07/05/2018   Procedure: BIOPSY;  Surgeon: Irving Copas., MD;  Location: [facility] ENDOSCOPY;  Service: Gastroenterology;;  . ERCP N/A 07/09/2018   Procedure: ENDOSCOPIC RETROGRADE CHOLANGIOPANCREATOGRAPHY (ERCP);  Surgeon: Milus Banister, MD;  Location: [facility] ENDOSCOPY;  Service: Endoscopy;  Laterality: N/A;  . ESOPHAGOGASTRODUODENOSCOPY (EGD) WITH PROPOFOL N/A 07/05/2018   Procedure: ESOPHAGOGASTRODUODENOSCOPY (EGD) WITH PROPOFOL;  Surgeon: Rush Landmark Telford Nab., MD;  Location: [place];  Service: Gastroenterology;  Laterality: N/A;  . EUS  07/05/2018   Procedure: FULL UPPER ENDOSCOPIC ULTRASOUND (EUS) RADIAL;  Surgeon: Rush Landmark Telford Nab., MD;  Location: [place];  Service: Gastroenterology;;  . INGUINAL HERNIA REPAIR Bilateral   . INSERT / REPLACE / REMOVE PACEMAKER  12/17/2017  . IR PERC CHOLECYSTOSTOMY  07/10/2018  . PACEMAKER IMPLANT N/A 12/17/2017   Procedure: PACEMAKER IMPLANT;  Surgeon: Evans Lance, MD;  Location: [place] CV LAB;  Service: Cardiovascular;  Laterality: N/A;  . PROSTATE BIOPSY    . REMOVAL OF STONES  07/09/2018   Procedure: REMOVAL OF STONES;  Surgeon: Milus Banister, MD;  Location: [facility] ENDOSCOPY;  Service: Endoscopy;;  . [name]  07/09/2018   Procedure: [name];  Surgeon: Milus Banister, MD;  Location: [place];  Service: Endoscopy;;  . THORACIC AORTIC ANEURYSM REPAIR      Current Outpatient Medications  Medication Sig Dispense Refill  . apixaban (ELIQUIS) 2.5 MG TABS tablet Take 1 tablet (2.5 mg total) by mouth 2 (two) times daily. 60 tablet  11  . Cholecalciferol (VITAMIN D3) 2000 units TABS Take 2,000 Units by mouth daily.     Marland Kitchen diltiazem (CARDIZEM CD) 180 MG 24 hr capsule Take 1 capsule (180 mg total) by mouth daily. 90 capsule 3  . furosemide (LASIX) 40 MG tablet TAKE 1 TABLET BY MOUTH EVERY DAY 90 tablet  3  . metoprolol succinate (TOPROL-XL) 25 MG 24 hr tablet Take 1 tablet (25 mg total) by mouth 2 (two) times daily. 180 tablet 3  . pantoprazole (PROTONIX) 40 MG tablet Take 1 tablet (40 mg total) by mouth 2 (two) times daily. 60 tablet 0  . tamsulosin (FLOMAX) 0.4 MG CAPS capsule Take 1 capsule (0.4 mg total) by mouth daily after supper. (Patient taking differently: Take 0.4 mg by mouth 2 (two) times daily. ) 30 capsule 0   No current facility-administered medications for this visit.     Allergies as of 08/13/2018 - Review Complete 08/13/2018  Allergen Reaction Noted  . Penicillins Other (See Comments) 10/02/2017    Family History  Problem Relation Age of Onset  . Hypertension Mother   . Tuberculosis Father   . Kidney disease Sister   . Sleep apnea Sister     Social History   Socioeconomic History  . Marital status: Widowed    Spouse name: Not on file  . Number of children: Not on file  . Years of education: Not on file  . Highest education level: Not on file  Occupational History  . Occupation: RETIRED  Social Needs  . Financial resource strain: Not on file  . Food insecurity:    Worry: Not on file    Inability: Not on file  . Transportation needs:    Medical: Not on file    Non-medical: Not on file  Tobacco Use  . Smoking status: Former Smoker    Packs/day: 0.50    Years: 30.00    Pack years: 15.00    Types: Cigarettes    Last attempt to quit: 1980    Years since quitting: 39.9  . Smokeless tobacco: Never Used  Substance and Sexual Activity  . Alcohol use: Yes    Alcohol/week: 1.0 standard drinks    Types: 1 Cans of beer per week    Comment: rare  . Drug use: No  . Sexual activity: Not on file  Lifestyle  . Physical activity:    Days per week: Not on file    Minutes per session: Not on file  . Stress: Not on file  Relationships  . Social connections:    Talks on phone: Not on file    Gets together: Not on file    Attends religious service: Not on file     Active member of club or organization: Not on file    Attends meetings of clubs or organizations: Not on file    Relationship status: Not on file  . Intimate partner violence:    Fear of current or ex partner: Not on file    Emotionally abused: Not on file    Physically abused: Not on file    Forced sexual activity: Not on file  Other Topics Concern  . 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 Lives alone during the summer in [place] and lives with daughter during the Winter in [place], moving to [place]. Has 1 daughter and 2 sons. One sone lives in [place] and one in [place].  Is an avid golfer, plays 4-5 days per week. Likes to do yardwork.      Physical Exam: BP 118/68 (BP Location: Left Arm, Patient Position: Sitting, Cuff Size: Normal)   Pulse 64   Ht '5\' 10"'  (1.778 m)   Wt 219 lb 6 oz (99.5 kg)   BMI 31.48 kg/m  Constitutional: generally well-appearing Psychiatric: alert and oriented x3 Abdomen: soft, nontender, nondistended, no obvious ascites, no peritoneal signs, normal bowel sounds No peripheral edema noted in lower extremities Right sided percutaneous cholecystotomy drain, bag examined.  The apparatus appears intact and in good position.  There is murky, somewhat foul-smelling brownish fluid in the bag.  Assessment and plan: 84 y.o. male with complex cholecystitis, cholangitis 06/2018  He recovered from his significant acute illness.  I am concerned that he has active infection again as the drainage into his cholecystotomy bag is increased and it is quite murky and foul-smelling over the past day or 2.  I am putting him on Levaquin which was what he was on before.  He will also have labs checked today including a CBC, complete metabolic profile and we will have fluid from his drain bag sent for ID and sensitivity.    He is scheduled for interventional radiology drain check 8 days from now but we  will see if that can be done sooner given the suspicion of active infection now.    He has not seen surgery or interventional radiology since he was discharged about 5 weeks ago we will call to [facility] surgery to get him back in their office to discuss timing of eventual cholecystectomy.    As far as his bile duct stones, I will plan on ERCP later this month, looking like the 26th, 2 attempt further removal of his bile duct stones.  Hopefully the biliary stent which I put in place for 5 weeks ago during his previous ERCP has loosened up the remaining stones and will allow for clearance of his bile duct.  He will need to hold his Eliquis for 2 days prior to that ERCP procedure and we will communicate with his cardiologist about the safety of that recommendation.  Please see the "Patient Instructions" section for addition details about the plan.  Owens Loffler, MD [facility] 08/13/2018, 2:10 PM

## 2018-08-13 NOTE — Patient Instructions (Addendum)
You will have labs checked today in the basement lab.  Please head down after you check out with the front desk  (cbc, cmet, GB drain fluid for ID and sensitivity).  Levaquin 500mg  one pill once daily; disp 14 pills, no refills.  CCSurgery follow up appointment to discuss timing of gallbladder resection surgery.  Will try to change your IR drain check to this week instead of next Wednesday.  ERCP 09/05/2018 to attempt to remove the rest of your bile duct stones.  You will need to stop your Eliquis for 2 days prior to that procedure.  We will communicate with cardiology about the safety of that recommendation.   Thank you for entrusting me with your care and choosing [place].  Dr Ardis Hughs

## 2018-08-14 ENCOUNTER — Encounter (HOSPITAL_COMMUNITY): Payer: Self-pay | Admitting: Interventional Radiology

## 2018-08-14 ENCOUNTER — Other Ambulatory Visit (HOSPITAL_COMMUNITY): Payer: Self-pay | Admitting: Diagnostic Radiology

## 2018-08-14 ENCOUNTER — Ambulatory Visit (HOSPITAL_COMMUNITY)
Admission: RE | Admit: 2018-08-14 | Discharge: 2018-08-14 | Disposition: A | Discharge status: Home / Self Care | Payer: Medicare Other | Source: Ambulatory Visit | Attending: Diagnostic Radiology | Admitting: Diagnostic Radiology

## 2018-08-14 DIAGNOSIS — K810 Acute cholecystitis: Secondary | ICD-10-CM

## 2018-08-14 DIAGNOSIS — Z434 Encounter for attention to other artificial openings of digestive tract: Secondary | ICD-10-CM | POA: Insufficient documentation

## 2018-08-14 DIAGNOSIS — K8000 Calculus of gallbladder with acute cholecystitis without obstruction: Secondary | ICD-10-CM | POA: Diagnosis not present

## 2018-08-14 HISTORY — PX: IR EXCHANGE BILIARY DRAIN: IMG6046

## 2018-08-14 MED ORDER — LIDOCAINE-EPINEPHRINE (PF) 1 %-1:200000 IJ SOLN
INTRAMUSCULAR | Status: DC | PRN
  Administered 2018-08-14: 10 mL

## 2018-08-14 MED ORDER — LIDOCAINE-EPINEPHRINE (PF) 1 %-1:200000 IJ SOLN
INTRAMUSCULAR | Status: AC
  Filled 2018-08-14: qty 30

## 2018-08-14 MED ORDER — IOPAMIDOL (ISOVUE-300) INJECTION 61%
INTRAVENOUS | Status: AC
  Administered 2018-08-14: 15 mL
  Filled 2018-08-14: qty 50

## 2018-08-14 MED FILL — NORMAL SALINE FLUSH SYRINGE: 0.9 | 30 days supply | Qty: 300 | Fill #0

## 2018-08-14 NOTE — Procedures (Signed)
Pre procedural Dx: Acute Cholelithiasis, poor operative candidate. Post procedural Dx: Same  Successful fluoroscopic guided exchange and up sizing of now 12 French cholecystostomy tube.   Extensive cholelithiasis and choledocholithiasis without definitive passage of administered contrast through the existing internal biliary stent.   Chole tube connected to gravity bag.   EBL: None Complications: None immediate  PLAN:  - Maintain existing cholecystostomy tube to external gravity bag.  Recommend continued flushing at least once per day.  - The patient is to undergo repeat endoscopy and by Dr. Ardis Hughs on 12/26.  - If interval cholecystectomy is not pursued, would recommend fluoroscopic guided exchange in approximately 2 months (early February 2020).   Ronny Bacon, MD Pager #: [phone]

## 2018-08-21 ENCOUNTER — Other Ambulatory Visit: Payer: Medicare Other

## 2018-08-23 ENCOUNTER — Ambulatory Visit: Payer: Self-pay | Admitting: Surgery

## 2018-08-23 DIAGNOSIS — K8042 Calculus of bile duct with acute cholecystitis without obstruction: Secondary | ICD-10-CM | POA: Diagnosis not present

## 2018-08-23 NOTE — H&P (View-Only) (Signed)
Ernest Parker Documented: 08/23/2018 10:11 AM Location: [facility] Patient #: [id number] DOB: 10-Aug-1934 Widowed / Language: Cleophus Molt / Race: White Male  History of Present Illness Ernest Parker A. Ernest Streight Parker; 08/23/2018 12:33 PM) Patient words: Patient returns for follow-up after undergoing percutaneous cholecystitis tube placement in November for acute cholecystitis and choledocholithiasis. He underwent ERCP with stent placement. Unfortunately, they were unable to clear the duct and he is to have a repeat ERCP later on this month. He had his percutaneous drain upsized due to decreased drainage and increasing feeling of malaise. He feels much better and is draining much better now. Upon reinspection after cholecystostomy tube interrogation he still had occlusion of the cystic duct. He is now draining bile-stained fluid and this is quite heavy according to his daughter. He denies abdominal pain nausea or vomiting he feels quite well today.  The patient is a 84 year old male.   Past Surgical History (Ernest Parker, [place]; 08/23/2018 10:11 AM) Aneurysm Repair  Diagnostic Studies History (Ernest Parker, [place]; 08/23/2018 10:11 AM) Colonoscopy >10 years ago  Allergies (Ernest Parker, [place]; 08/23/2018 10:12 AM) Penicillins Allergies Reconciled  Medication History (Ernest Parker, [place]; 08/23/2018 10:14 AM) Eliquis (2.5MG  Tablet, Oral) Active. Vitamin D3 (2000UNIT Tablet, Oral) Active. dilTIAZem HCl ER (180MG  Capsule ER 24HR, Oral) Active. Lasix (40MG  Tablet, Oral) Active. levoFLOXacin (500MG  Tablet, Oral) Active. Metoprolol Tartrate (25MG  Tablet, Oral) Active. Protonix (40MG  Packet, Oral) Active. Flomax (0.4MG  Capsule, Oral) Active. Medications Reconciled  Social History (Ernest Parker, [place]; 08/23/2018 10:11 AM) Alcohol use Occasional alcohol use. Caffeine use Carbonated beverages, Coffee. No drug use Tobacco use Former smoker.  Family History  (Ernest Parker, [place]; 08/23/2018 10:11 AM) Hypertension Mother.  Other Problems (Ernest Parker, [place]; 08/23/2018 10:11 AM) Atrial Fibrillation Bladder Problems Cholelithiasis Congestive Heart Failure Gastroesophageal Reflux Disease High blood pressure Hypercholesterolemia Prostate Cancer     Review of Systems (Ernest Parker; 08/23/2018 10:11 AM) General Present- Fatigue. Not Present- Appetite Loss, Chills, Fever, Night Sweats, Weight Gain and Weight Loss. Skin Not Present- Change in Wart/Mole, Dryness, Hives, Jaundice, New Lesions, Non-Healing Wounds, Rash and Ulcer. HEENT Present- Wears glasses/contact lenses. Not Present- Earache, Hearing Loss, Hoarseness, Nose Bleed, Oral Ulcers, Ringing in the Ears, Seasonal Allergies, Sinus Pain, Sore Throat, Visual Disturbances and Yellow Eyes. Respiratory Present- Wheezing. Not Present- Bloody sputum, Chronic Cough, Difficulty Breathing and Snoring. Breast Not Present- Breast Mass, Breast Pain, Nipple Discharge and Skin Changes. Cardiovascular Present- Rapid Heart Rate. Not Present- Chest Pain, Difficulty Breathing Lying Down, Leg Cramps, Palpitations, Shortness of Breath and Swelling of Extremities. Gastrointestinal Present- Change in Bowel Habits. Not Present- Abdominal Pain, Bloating, Bloody Stool, Chronic diarrhea, Constipation, Difficulty Swallowing, Excessive gas, Gets full quickly at meals, Hemorrhoids, Indigestion, Nausea, Rectal Pain and Vomiting. Musculoskeletal Present- Muscle Weakness. Not Present- Back Pain, Joint Pain, Joint Stiffness, Muscle Pain and Swelling of Extremities. Neurological Present- Fainting. Not Present- Decreased Memory, Headaches, Numbness, Seizures, Tingling, Tremor, Trouble walking and Weakness. Endocrine Not Present- Cold Intolerance, Excessive Hunger, Hair Changes, Heat Intolerance, Hot flashes and New Diabetes. Hematology Present- Blood Thinners. Not Present- Easy Bruising, Excessive bleeding,  Gland problems, HIV and Persistent Infections.  Vitals (Ernest Parker; 08/23/2018 10:12 AM) 08/23/2018 10:12 AM Weight: 212.8 lb Height: 70in Body Surface Area: 2.14 m Body Mass Index: 30.53 kg/m  Temp.: 98.26F  Pulse: 68 (Regular)  BP: 122/72 (Sitting, Left Arm, Standard)      Physical Exam (Ernest Parker; 08/23/2018 12:33 PM)  General Mental Status-Alert. General Appearance-Consistent with stated age. Hydration-Well hydrated. Voice-Normal.  Cardiovascular Note: Atrial fibrillation rate controlled  Abdomen Note: Soft nontender.  Drain noted without redness or inflammation. Clear bile noted in bag.  Musculoskeletal Normal Exam - Left-Upper Extremity Strength Normal and Lower Extremity Strength Normal. Normal Exam - Right-Upper Extremity Strength Normal, Lower Extremity Weakness.    Assessment & Plan (Ernest Parker; 08/23/2018 12:34 PM)  CHOLEDOCHOLITHIASIS WITH ACUTE CHOLECYSTITIS (K80.42) Impression: The patient is to undergo repeat ERCP in 2 weeks. Recommend laparoscopic cholecystectomy next month once his duct is cleared. If not, he may require an open procedure and open common duct expiration. We discussed all this in the pros and cons of this with the patient and daughter at the bedside today. We discussed use of T tube and The procedure has been discussed with the patient. Risks of laparoscopic cholecystectomy include bleeding, infection, bile duct injury, leak, death, open surgery, diarrhea, other surgery, organ injury, blood vessel injury, DVT, and additional care. to drainage tubes. We discussed possible narrowing of the common bile duct leading to potential liver failure and potential complications of that.  Current Plans You are being scheduled for surgery- Our schedulers will call you.  You should hear from our office's scheduling department within 5 working days about the location, date, and time of surgery.  We try to make accommodations for patient's preferences in scheduling surgery, but sometimes the OR schedule or the surgeon's schedule prevents [place] from making those accommodations.  If you have not heard from our office [phone] in 5 working days, call the office and ask for your surgeon's nurse.  If you have other questions about your diagnosis, plan, or surgery, call the office and ask for your surgeon's nurse.  Pt Education - Pamphlet Given - Laparoscopic Gallbladder Surgery: discussed with patient and provided information. Written instructions provided The anatomy & physiology of hepatobiliary & pancreatic function was discussed. The pathophysiology of gallbladder dysfunction was discussed. Natural history risks without surgery was discussed. I feel the risks of no intervention will lead to serious problems that outweigh the operative risks; therefore, I recommended cholecystectomy to remove the pathology. I explained laparoscopic techniques with possible need for an open approach. Probable cholangiogram to evaluate the bilary tract was explained as well.  Risks such as bleeding, infection, abscess, leak, injury to other organs, need for further treatment, heart attack, death, and other risks were discussed. I noted a good likelihood this will help address the problem. Possibility that this will not correct all abdominal symptoms was explained. Goals of post-operative recovery were discussed as well. We will work to minimize complications. An educational handout further explaining the pathology and treatment options was given as well. Questions were answered. The patient expresses understanding & wishes to proceed with surgery.  Pt Education - CCS Laparosopic Post Op HCI ([place]) Pt Education - Laparoscopic Cholecystectomy: gallbladder

## 2018-08-23 NOTE — H&P (Signed)
Ernest Parker Documented: 08/23/2018 10:11 AM Location: [facility] Patient #: [id number] DOB: 1934-05-31 Widowed / Language: Ernest Parker / Race: White Male  History of Present Illness Ernest Parker A. Ernest Sitzer MD; 08/23/2018 12:33 PM) Patient words: Patient returns for follow-up after undergoing percutaneous cholecystitis tube placement in November for acute cholecystitis and choledocholithiasis. He underwent ERCP with stent placement. Unfortunately, they were unable to clear the duct and he is to have a repeat ERCP later on this month. He had his percutaneous drain upsized due to decreased drainage and increasing feeling of malaise. He feels much better and is draining much better now. Upon reinspection after cholecystostomy tube interrogation he still had occlusion of the cystic duct. He is now draining bile-stained fluid and this is quite heavy according to his daughter. He denies abdominal pain nausea or vomiting he feels quite well today.  The patient is a 84 year old male.   Past Surgical History (Ernest Parker, [place]; 08/23/2018 10:11 AM) Aneurysm Repair  Diagnostic Studies History (Ernest Parker, [place]; 08/23/2018 10:11 AM) Colonoscopy >10 years ago  Allergies (Ernest Parker, [place]; 08/23/2018 10:12 AM) Penicillins Allergies Reconciled  Medication History (Ernest Parker, [place]; 08/23/2018 10:14 AM) Eliquis (2.5MG  Tablet, Oral) Active. Vitamin D3 (2000UNIT Tablet, Oral) Active. dilTIAZem HCl ER (180MG  Capsule ER 24HR, Oral) Active. Lasix (40MG  Tablet, Oral) Active. levoFLOXacin (500MG  Tablet, Oral) Active. Metoprolol Tartrate (25MG  Tablet, Oral) Active. Protonix (40MG  Packet, Oral) Active. Flomax (0.4MG  Capsule, Oral) Active. Medications Reconciled  Social History (Ernest Parker, [place]; 08/23/2018 10:11 AM) Alcohol use Occasional alcohol use. Caffeine use Carbonated beverages, Coffee. No drug use Tobacco use Former smoker.  Family History  (Ernest Parker, [place]; 08/23/2018 10:11 AM) Hypertension Mother.  Other Problems (Ernest Parker, [place]; 08/23/2018 10:11 AM) Atrial Fibrillation Bladder Problems Cholelithiasis Congestive Heart Failure Gastroesophageal Reflux Disease High blood pressure Hypercholesterolemia Prostate Cancer     Review of Systems (Ernest A. Brown RMA; 08/23/2018 10:11 AM) General Present- Fatigue. Not Present- Appetite Loss, Chills, Fever, Night Sweats, Weight Gain and Weight Loss. Skin Not Present- Change in Wart/Mole, Dryness, Hives, Jaundice, New Lesions, Non-Healing Wounds, Rash and Ulcer. HEENT Present- Wears glasses/contact lenses. Not Present- Earache, Hearing Loss, Hoarseness, Nose Bleed, Oral Ulcers, Ringing in the Ears, Seasonal Allergies, Sinus Pain, Sore Throat, Visual Disturbances and Yellow Eyes. Respiratory Present- Wheezing. Not Present- Bloody sputum, Chronic Cough, Difficulty Breathing and Snoring. Breast Not Present- Breast Mass, Breast Pain, Nipple Discharge and Skin Changes. Cardiovascular Present- Rapid Heart Rate. Not Present- Chest Pain, Difficulty Breathing Lying Down, Leg Cramps, Palpitations, Shortness of Breath and Swelling of Extremities. Gastrointestinal Present- Change in Bowel Habits. Not Present- Abdominal Pain, Bloating, Bloody Stool, Chronic diarrhea, Constipation, Difficulty Swallowing, Excessive gas, Gets full quickly at meals, Hemorrhoids, Indigestion, Nausea, Rectal Pain and Vomiting. Musculoskeletal Present- Muscle Weakness. Not Present- Back Pain, Joint Pain, Joint Stiffness, Muscle Pain and Swelling of Extremities. Neurological Present- Fainting. Not Present- Decreased Memory, Headaches, Numbness, Seizures, Tingling, Tremor, Trouble walking and Weakness. Endocrine Not Present- Cold Intolerance, Excessive Hunger, Hair Changes, Heat Intolerance, Hot flashes and New Diabetes. Hematology Present- Blood Thinners. Not Present- Easy Bruising, Excessive bleeding,  Gland problems, HIV and Persistent Infections.  Vitals (Ernest A. Brown RMA; 08/23/2018 10:12 AM) 08/23/2018 10:12 AM Weight: 212.8 lb Height: 70in Body Surface Area: 2.14 m Body Mass Index: 30.53 kg/m  Temp.: 98.56F  Pulse: 68 (Regular)  BP: 122/72 (Sitting, Left Arm, Standard)      Physical Exam (Maytte Jacot A. Aarica Wax MD; 08/23/2018 12:33 PM)  General Mental Status-Alert. General Appearance-Consistent with stated age. Hydration-Well hydrated. Voice-Normal.  Cardiovascular Note: Atrial fibrillation rate controlled  Abdomen Note: Soft nontender.  Drain noted without redness or inflammation. Clear bile noted in bag.  Musculoskeletal Normal Exam - Left-Upper Extremity Strength Normal and Lower Extremity Strength Normal. Normal Exam - Right-Upper Extremity Strength Normal, Lower Extremity Weakness.    Assessment & Plan (Ruthella Kirchman A. Delman Goshorn MD; 08/23/2018 12:34 PM)  CHOLEDOCHOLITHIASIS WITH ACUTE CHOLECYSTITIS (K80.42) Impression: The patient is to undergo repeat ERCP in 2 weeks. Recommend laparoscopic cholecystectomy next month once his duct is cleared. If not, he may require an open procedure and open common duct expiration. We discussed all this in the pros and cons of this with the patient and daughter at the bedside today. We discussed use of T tube and The procedure has been discussed with the patient. Risks of laparoscopic cholecystectomy include bleeding, infection, bile duct injury, leak, death, open surgery, diarrhea, other surgery, organ injury, blood vessel injury, DVT, and additional care. to drainage tubes. We discussed possible narrowing of the common bile duct leading to potential liver failure and potential complications of that.  Current Plans You are being scheduled for surgery- Our schedulers will call you.  You should hear from our office's scheduling department within 5 working days about the location, date, and time of surgery.  We try to make accommodations for patient's preferences in scheduling surgery, but sometimes the OR schedule or the surgeon's schedule prevents [place] from making those accommodations.  If you have not heard from our office [phone] in 5 working days, call the office and ask for your surgeon's nurse.  If you have other questions about your diagnosis, plan, or surgery, call the office and ask for your surgeon's nurse.  Pt Education - Pamphlet Given - Laparoscopic Gallbladder Surgery: discussed with patient and provided information. Written instructions provided The anatomy & physiology of hepatobiliary & pancreatic function was discussed. The pathophysiology of gallbladder dysfunction was discussed. Natural history risks without surgery was discussed. I feel the risks of no intervention will lead to serious problems that outweigh the operative risks; therefore, I recommended cholecystectomy to remove the pathology. I explained laparoscopic techniques with possible need for an open approach. Probable cholangiogram to evaluate the bilary tract was explained as well.  Risks such as bleeding, infection, abscess, leak, injury to other organs, need for further treatment, heart attack, death, and other risks were discussed. I noted a good likelihood this will help address the problem. Possibility that this will not correct all abdominal symptoms was explained. Goals of post-operative recovery were discussed as well. We will work to minimize complications. An educational handout further explaining the pathology and treatment options was given as well. Questions were answered. The patient expresses understanding & wishes to proceed with surgery.  Pt Education - CCS Laparosopic Post Op HCI ([place]) Pt Education - Laparoscopic Cholecystectomy: gallbladder

## 2018-08-26 ENCOUNTER — Telehealth: Payer: Self-pay

## 2018-08-26 NOTE — Telephone Encounter (Signed)
   [facility] Pre-operative Risk Assessment    Request for surgical clearance:  1. What type of surgery is being performed?  Gallbladder   2. When is this surgery scheduled?  TBD   3. What type of clearance is required (medical clearance vs. Pharmacy clearance to hold med vs. Both)?  BOTH  4. Are there any medications that need to be held prior to surgery and how long? Eliquis 2 days   5. Practice name and name of physician performing surgery?  [facility]   6. What is your office phone number [phone]    7.   What is your office fax number [phone]  8.   Anesthesia type (None, local, MAC, general) ?  general   Ernest Parker 08/26/2018, 10:51 AM  _________________________________________________________________   (provider comments below)

## 2018-08-28 DIAGNOSIS — R338 Other retention of urine: Secondary | ICD-10-CM | POA: Diagnosis not present

## 2018-08-28 NOTE — Telephone Encounter (Signed)
   Primary Cardiologist: [name], MD  Chart reviewed as part of pre-operative protocol coverage. Amber Seiler cleared patient to proceed with surgery in recent OV 07/2018, but was open-ended regarding Eliquis - will route to pharm for final input given prior hx of TIA, then will need to bundle recommendation.  Charlie Pitter, PA-C 08/28/2018, 5:32 PM

## 2018-08-30 NOTE — Telephone Encounter (Signed)
   Primary Cardiologist:Gregg Lovena Le, MD  Chart reviewed as part of pre-operative protocol coverage. Pre-op clearance already addressed by colleagues in earlier phone notes. To summarize recommendations:  - OK to proceed with surgery.   - Patient with diagnosis of Afib on Eliquis for anticoagulation.    Procedure: Gallbladder Date of procedure: TBD  CHADS2-VASc score of 5 (CHF, HTN, AGE, DM2, stroke/tia x 2, CAD, AGE, male)  CrCl 17ml/min  Per office protocol, patient can hold Eliquis for 24 hours prior to procedure.      Will route this bundled recommendation to requesting provider via Epic fax function. Please call with questions.  Lyda Jester, PA-C 08/30/2018, 9:03 AM

## 2018-08-30 NOTE — Telephone Encounter (Signed)
Patient with diagnosis of Afib on Eliquis for anticoagulation.    Procedure: Gallbladder Date of procedure: TBD  CHADS2-VASc score of 5 (CHF, HTN, AGE, DM2, stroke/tia x 2, CAD, AGE, male)  CrCl 30ml/min  Per office protocol, patient can hold Eliquis for 24 hours prior to procedure.

## 2018-08-31 LAB — CUP PACEART REMOTE DEVICE CHECK
Battery Remaining Longevity: 118 mo
Battery Remaining Percentage: 95.5 %
Battery Voltage: 3.02 V
Brady Statistic AP VP Percent: 1 %
Brady Statistic AS VS Percent: 2.7 %
Brady Statistic RA Percent Paced: 93 %
Brady Statistic RV Percent Paced: 1.9 %
Date Time Interrogation Session: 20191017060020
Implantable Lead Implant Date: 20190408
Implantable Lead Implant Date: 20190408
Implantable Lead Location: 753859
Implantable Lead Location: 753860
Implantable Pulse Generator Implant Date: 20190408
Lead Channel Impedance Value: 490 Ohm
Lead Channel Impedance Value: 580 Ohm
Lead Channel Pacing Threshold Amplitude: 0.75 V
Lead Channel Pacing Threshold Amplitude: 1.5 V
Lead Channel Pacing Threshold Pulse Width: 0.5 ms
Lead Channel Sensing Intrinsic Amplitude: 2.1 mV
Lead Channel Setting Pacing Amplitude: 1 V
Lead Channel Setting Pacing Amplitude: 2.5 V
Lead Channel Setting Pacing Pulse Width: 0.5 ms
Lead Channel Setting Sensing Sensitivity: 2 mV
MDC IDC MSMT LEADCHNL RV PACING THRESHOLD PULSEWIDTH: 0.5 ms
MDC IDC MSMT LEADCHNL RV SENSING INTR AMPL: 12 mV
MDC IDC STAT BRADY AP VS PERCENT: 96 %
MDC IDC STAT BRADY AS VP PERCENT: 1 %
Pulse Gen Model: 2272
Pulse Gen Serial Number: 9004195

## 2018-09-02 ENCOUNTER — Encounter (HOSPITAL_COMMUNITY): Payer: Self-pay | Admitting: *Deleted

## 2018-09-02 ENCOUNTER — Other Ambulatory Visit: Payer: Self-pay

## 2018-09-05 ENCOUNTER — Encounter (HOSPITAL_COMMUNITY): Payer: Self-pay | Admitting: Gastroenterology

## 2018-09-05 ENCOUNTER — Ambulatory Visit (HOSPITAL_COMMUNITY): Payer: Medicare Other | Admitting: Anesthesiology

## 2018-09-05 ENCOUNTER — Ambulatory Visit (HOSPITAL_COMMUNITY): Payer: Medicare Other

## 2018-09-05 ENCOUNTER — Other Ambulatory Visit: Payer: Self-pay

## 2018-09-05 ENCOUNTER — Ambulatory Visit (HOSPITAL_COMMUNITY)
Admission: RE | Admit: 2018-09-05 | Discharge: 2018-09-05 | Disposition: A | Discharge status: Home / Self Care | Payer: Medicare Other | Attending: Gastroenterology | Admitting: Gastroenterology

## 2018-09-05 ENCOUNTER — Telehealth: Payer: Self-pay | Admitting: Gastroenterology

## 2018-09-05 ENCOUNTER — Encounter (HOSPITAL_COMMUNITY)
Admission: RE | Disposition: A | Discharge status: Home / Self Care | Payer: Self-pay | Source: Home / Self Care | Attending: Gastroenterology

## 2018-09-05 DIAGNOSIS — N183 Chronic kidney disease, stage 3 (moderate): Secondary | ICD-10-CM | POA: Diagnosis not present

## 2018-09-05 DIAGNOSIS — K8071 Calculus of gallbladder and bile duct without cholecystitis with obstruction: Secondary | ICD-10-CM | POA: Diagnosis not present

## 2018-09-05 DIAGNOSIS — K8050 Calculus of bile duct without cholangitis or cholecystitis without obstruction: Secondary | ICD-10-CM | POA: Diagnosis not present

## 2018-09-05 DIAGNOSIS — I10 Essential (primary) hypertension: Secondary | ICD-10-CM | POA: Diagnosis not present

## 2018-09-05 DIAGNOSIS — I129 Hypertensive chronic kidney disease with stage 1 through stage 4 chronic kidney disease, or unspecified chronic kidney disease: Secondary | ICD-10-CM | POA: Diagnosis not present

## 2018-09-05 DIAGNOSIS — K219 Gastro-esophageal reflux disease without esophagitis: Secondary | ICD-10-CM | POA: Insufficient documentation

## 2018-09-05 DIAGNOSIS — K8309 Other cholangitis: Secondary | ICD-10-CM | POA: Insufficient documentation

## 2018-09-05 DIAGNOSIS — Z87891 Personal history of nicotine dependence: Secondary | ICD-10-CM | POA: Diagnosis not present

## 2018-09-05 DIAGNOSIS — Z79899 Other long term (current) drug therapy: Secondary | ICD-10-CM | POA: Diagnosis not present

## 2018-09-05 DIAGNOSIS — I480 Paroxysmal atrial fibrillation: Secondary | ICD-10-CM | POA: Diagnosis not present

## 2018-09-05 DIAGNOSIS — I4891 Unspecified atrial fibrillation: Secondary | ICD-10-CM | POA: Diagnosis not present

## 2018-09-05 HISTORY — PX: BALLOON DILATION: SHX5330

## 2018-09-05 HISTORY — PX: ENDOSCOPIC RETROGRADE CHOLANGIOPANCREATOGRAPHY (ERCP) WITH PROPOFOL: SHX5810

## 2018-09-05 HISTORY — PX: SPHINCTEROTOMY: SHX5279

## 2018-09-05 HISTORY — PX: STONE EXTRACTION WITH BASKET: SHX5318

## 2018-09-05 HISTORY — PX: STENT REMOVAL: SHX6421

## 2018-09-05 HISTORY — PX: BILIARY STENT PLACEMENT: SHX5538

## 2018-09-05 LAB — GLUCOSE, CAPILLARY: Glucose-Capillary: 170 mg/dL — ABNORMAL HIGH (ref 70–99)

## 2018-09-05 SURGERY — ENDOSCOPIC RETROGRADE CHOLANGIOPANCREATOGRAPHY (ERCP) WITH PROPOFOL
Anesthesia: General

## 2018-09-05 MED ORDER — LEVOFLOXACIN 500 MG PO TABS: 500.0000 mg | ORAL_TABLET | Freq: Every day | ORAL | 0 refills | Status: DC

## 2018-09-05 MED ORDER — CIPROFLOXACIN IN D5W 400 MG/200ML IV SOLN
INTRAVENOUS | Status: AC
  Filled 2018-09-05: qty 200

## 2018-09-05 MED ORDER — LIDOCAINE HCL (CARDIAC) PF 100 MG/5ML IV SOSY
PREFILLED_SYRINGE | INTRAVENOUS | Status: DC | PRN
  Administered 2018-09-05: 100 mg via INTRAVENOUS

## 2018-09-05 MED ORDER — EPHEDRINE SULFATE-NACL 50-0.9 MG/10ML-% IV SOSY
PREFILLED_SYRINGE | INTRAVENOUS | Status: DC | PRN
  Administered 2018-09-05: 10 mg via INTRAVENOUS

## 2018-09-05 MED ORDER — PROPOFOL 10 MG/ML IV BOLUS
INTRAVENOUS | Status: AC
  Filled 2018-09-05: qty 20

## 2018-09-05 MED ORDER — IOPAMIDOL (ISOVUE-M 300) INJECTION 61%
INTRAMUSCULAR | Status: DC | PRN
  Administered 2018-09-05: 150 mL via INTRATHECAL

## 2018-09-05 MED ORDER — ONDANSETRON HCL 4 MG/2ML IJ SOLN
INTRAMUSCULAR | Status: DC | PRN
  Administered 2018-09-05: 4 mg via INTRAVENOUS

## 2018-09-05 MED ORDER — DEXAMETHASONE SODIUM PHOSPHATE 10 MG/ML IJ SOLN
INTRAMUSCULAR | Status: DC | PRN
  Administered 2018-09-05: 10 mg via INTRAVENOUS

## 2018-09-05 MED ORDER — PROPOFOL 10 MG/ML IV BOLUS
INTRAVENOUS | Status: DC | PRN
  Administered 2018-09-05: 70 mg via INTRAVENOUS

## 2018-09-05 MED ORDER — PHENYLEPHRINE HCL 10 MG/ML IJ SOLN
INTRAMUSCULAR | Status: DC | PRN
  Administered 2018-09-05: 160 ug via INTRAVENOUS
  Administered 2018-09-05: 80 ug via INTRAVENOUS
  Administered 2018-09-05: 120 ug via INTRAVENOUS
  Administered 2018-09-05: 80 ug via INTRAVENOUS
  Administered 2018-09-05: 160 ug via INTRAVENOUS

## 2018-09-05 MED ORDER — SUGAMMADEX SODIUM 200 MG/2ML IV SOLN
INTRAVENOUS | Status: DC | PRN
  Administered 2018-09-05: 200 mg via INTRAVENOUS

## 2018-09-05 MED ORDER — METRONIDAZOLE 500 MG PO TABS: 500.0000 mg | ORAL_TABLET | Freq: Two times a day (BID) | ORAL | 0 refills | Status: DC

## 2018-09-05 MED ORDER — SODIUM CHLORIDE 0.9 % IV SOLN
INTRAVENOUS | Status: DC | PRN
  Administered 2018-09-05: 50 ug/min via INTRAVENOUS

## 2018-09-05 MED ORDER — GLUCAGON HCL RDNA (DIAGNOSTIC) 1 MG IJ SOLR
INTRAMUSCULAR | Status: DC | PRN
  Administered 2018-09-05: .5 mg via INTRAVENOUS

## 2018-09-05 MED ORDER — INDOMETHACIN 50 MG RE SUPP
RECTAL | Status: DC | PRN
  Administered 2018-09-05: 100 mg via RECTAL

## 2018-09-05 MED ORDER — CIPROFLOXACIN HCL 500 MG PO TABS: 500.0000 mg | ORAL_TABLET | Freq: Two times a day (BID) | ORAL | 0 refills | Status: DC

## 2018-09-05 MED ORDER — INDOMETHACIN 50 MG RE SUPP
RECTAL | Status: AC
  Filled 2018-09-05: qty 2

## 2018-09-05 MED ORDER — SODIUM CHLORIDE 0.9 % IV SOLN: INTRAVENOUS | Status: DC

## 2018-09-05 MED ORDER — CIPROFLOXACIN IN D5W 400 MG/200ML IV SOLN
INTRAVENOUS | Status: DC | PRN
  Administered 2018-09-05: 400 mg via INTRAVENOUS

## 2018-09-05 MED ORDER — GLUCAGON HCL RDNA (DIAGNOSTIC) 1 MG IJ SOLR
INTRAMUSCULAR | Status: AC
  Filled 2018-09-05: qty 1

## 2018-09-05 MED ORDER — LACTATED RINGERS IV SOLN
INTRAVENOUS | Status: DC
  Administered 2018-09-05: 1000 mL via INTRAVENOUS
  Administered 2018-09-05: 09:00:00 via INTRAVENOUS

## 2018-09-05 MED ORDER — ROCURONIUM BROMIDE 100 MG/10ML IV SOLN
INTRAVENOUS | Status: DC | PRN
  Administered 2018-09-05: 10 mg via INTRAVENOUS
  Administered 2018-09-05: 50 mg via INTRAVENOUS

## 2018-09-05 MED ORDER — FENTANYL CITRATE (PF) 100 MCG/2ML IJ SOLN
INTRAMUSCULAR | Status: AC
  Filled 2018-09-05: qty 2

## 2018-09-05 MED ORDER — FENTANYL CITRATE (PF) 100 MCG/2ML IJ SOLN
INTRAMUSCULAR | Status: DC | PRN
  Administered 2018-09-05: 50 ug via INTRAVENOUS
  Administered 2018-09-05: 25 ug via INTRAVENOUS

## 2018-09-05 NOTE — Op Note (Signed)
[facility] Patient Name: Ernest Parker Procedure Date: 09/05/2018 MRN: [id number] Attending MD: Milus Banister , MD Date of Birth: [birth year] CSN: [id number] Age: 84 Admit Type: Outpatient Procedure:                ERCP Indications:              Cholangitis, cholecystitis October 2019. Eventual                            ERCP July 09, 2018 found multiple common bile                            duct stones, multiple cystic duct stones, suspicion                            of Mirizzi compression, ampulla was within a                            diverticulum, sphincterotomy performed, partial                            stone removal, placement of a 8.5 French 10 cm long                            plastic biliary stent, + purulent cholangitis.                            Interventional radiology perc cholecystostomy tube                            placed 07/11/2018, + pus in GB. Providers:                Milus Banister, MD, William Dalton, Technician,                            Burtis Junes, RN Referring MD:              Medicines:                General Anesthesia, Cipro 400 mg IV, Indomethacin                            782 mg PR Complications:            No immediate complications. Estimated blood loss:                            None Estimated Blood Loss:     Estimated blood loss: none. Procedure:                Pre-Anesthesia Assessment:                           - Prior to the procedure, a History and Physical                            was performed, and patient medications and  allergies were reviewed. The patient's tolerance of                            previous anesthesia was also reviewed. The risks                            and benefits of the procedure and the sedation                            options and risks were discussed with the patient.                            All questions were answered, and informed consent                     was obtained. Prior Anticoagulants: The patient has                            taken Eliquis (apixaban), last dose was 5 days                            prior to procedure. ASA Grade Assessment: III - A                            patient with severe systemic disease. After                            reviewing the risks and benefits, the patient was                            deemed in satisfactory condition to undergo the                            procedure.                           After obtaining informed consent, the scope was                            passed under direct vision. Throughout the                            procedure, the patient's blood pressure, pulse, and                            oxygen saturations were monitored continuously. The                            [id number]-[id number] ([id number]) Olympus ERCP was introduced                            through the mouth, and used to inject contrast into  and used to inject contrast into the bile duct. The                            ERCP was accomplished without difficulty. The                            patient tolerated the procedure well. Scope In: Scope Out: Findings:      Scout film showed stent and GB drain in the RUQ. The duodenoscope was       advanced to the region of the major papilla. The previously placed stent       was located within a large food filled periampullary diverticulum. I       cleared the food from the diverticulum with flushing, suction and then       removed the previous biliary stent with a snare. I obtained biliary       access using a 64 Autotome over a .035 hydrawire and injected contrast.       Cholangiogram revealed extensive CBD, CHD and cystic duct stones. I       elected to extend the previous biliary sphincterotomy site with ERBE       cautery and then hurricane balloon dilation (to 33mm). Over 90 minutes I       then attempted to clear the CBD using  several biliary baskets (2cm,       2.5cm, 3.5cm) as well as extensive balloon sweeping. This resulted in       removal of several small stones, extenstive biliary debris. There was a       suggestion of purulence draining into the duodenum as well. Given the       continued, extensive stone burden (CBD, extending into the CHD and       cystic duct as well) I deemed further attempts futile and elected to       place an 8.5 Fr 12cm plastic biliary stent (proximal end within right       hepatic bile duct, above the stone burden, and distal end extending       across the sphincterotomy into the duodenum in good position). The       pancreatic duct was never cannulated with wire or injected with dye. Impression:               - Extensive gallstone disease with stones in the                            CBD, CHD, cystic duct. Despite 29min attempt I was                            only able to partially clear the CBD (stones,                            biliary debris, purulence). There remains multiple                            large stones within the CBD, CHD and cystic duct                            which I am unable to  remove. A new 8.5Fr 12cm long                            plastic biliary stent was placed in good position                            (bridging the remaining CHD, CBD stone burden). Moderate Sedation:      Not Applicable - Patient had care per Anesthesia. Recommendation:           - Discharge patient to home (ambulatory).                           - Will discuss with Dr. Brantley Stage, likely will                            recommend open chole with bile duct exploraion.                           - Given the purulence noted today as well as family                            report that the GB drain is again draining foul                            smelling, thick liquid, I will put him back on                            cipro/flagyl (2 week course). Procedure Code(s):        ---  Professional ---                           [phone], Endoscopic retrograde                            cholangiopancreatography (ERCP); with placement of                            endoscopic stent into biliary or pancreatic duct,                            including pre- and post-dilation and guide wire                            passage, when performed, including sphincterotomy,                            when performed, each stent                           [id number], Endoscopic retrograde                            cholangiopancreatography (ERCP); with removal of  calculi/debris from biliary/pancreatic duct(s) Diagnosis Code(s):        --- Professional ---                           K80.50, Calculus of bile duct without cholangitis                            or cholecystitis without obstruction CPT copyright 2018 [employer] Association. All rights reserved. The codes documented in this report are preliminary and upon coder review may  be revised to meet current compliance requirements. Milus Banister, MD 09/05/2018 9:28:01 AM This report has been signed electronically. Number of Addenda: 0

## 2018-09-05 NOTE — Transfer of Care (Signed)
Immediate Anesthesia Transfer of Care Note  Patient: Ernest Parker  Procedure(s) Performed: ENDOSCOPIC RETROGRADE CHOLANGIOPANCREATOGRAPHY (ERCP) WITH PROPOFOL (N/A ) SPHINCTEROTOMY BALLOON DILATION (N/A ) STONE EXTRACTION WITH BASKET STENT REMOVAL BILIARY STENT PLACEMENT (N/A )  Patient Location: PACU  Anesthesia Type:General  Level of Consciousness: awake, alert  and oriented  Airway & Oxygen Therapy: Patient Spontanous Breathing and Patient connected to face mask oxygen  Post-op Assessment: Report given to RN and Post -op Vital signs reviewed and stable  Post vital signs: Reviewed and stable  Last Vitals:  Vitals Value Taken Time  BP 126/62 09/05/2018  9:20 AM  Temp    Pulse 66 09/05/2018  9:22 AM  Resp 13 09/05/2018  9:22 AM  SpO2 100 % 09/05/2018  9:22 AM  Vitals shown include unvalidated device data.  Last Pain:  Vitals:   09/05/18 0656  TempSrc: Oral  PainSc: 0-No pain         Complications: No apparent anesthesia complications

## 2018-09-05 NOTE — Telephone Encounter (Signed)
Spoke with the pharmacist she states Levaquin is also contraindicated. I explained to her that he recently had Levaquin and did fine with it but she states she doesn't know how that slipped through the cracks. She states he can either have Septra or Doxycycline due to his Penicillin allergy.

## 2018-09-05 NOTE — Interval H&P Note (Signed)
History and Physical Interval Note:  09/05/2018 7:12 AM  Ernest Parker  has presented today for surgery, with the diagnosis of bile duct stone  The various methods of treatment have been discussed with the patient and family. After consideration of risks, benefits and other options for treatment, the patient has consented to  Procedure(s): ENDOSCOPIC RETROGRADE CHOLANGIOPANCREATOGRAPHY (ERCP) WITH PROPOFOL (N/A) as a surgical intervention .  The patient's history has been reviewed, patient examined, no change in status, stable for surgery.  I have reviewed the patient's chart and labs.  Questions were answered to the patient's satisfaction.     Milus Banister

## 2018-09-05 NOTE — Telephone Encounter (Signed)
Dr. Ardis Hughs please advise an alternative to cipro, pharmacist called with contraindication due to hx of aortic aneurysm.

## 2018-09-05 NOTE — Discharge Instructions (Signed)
YOU HAD AN ENDOSCOPIC PROCEDURE TODAY: Refer to the procedure report and other information in the discharge instructions given to you for any specific questions about what was found during the examination. If this information does not answer your questions, please call [place] office at [phone] to clarify.  ° °YOU SHOULD EXPECT: Some feelings of bloating in the abdomen. Passage of more gas than usual. Walking can help get rid of the air that was put into your GI tract during the procedure and reduce the bloating. If you had a lower endoscopy (such as a colonoscopy or flexible sigmoidoscopy) you may notice spotting of blood in your stool or on the toilet paper. Some abdominal soreness may be present for a day or two, also. ° °DIET: Your first meal following the procedure should be a light meal and then it is ok to progress to your normal diet. A half-sandwich or bowl of soup is an example of a good first meal. Heavy or fried foods are harder to digest and may make you feel nauseous or bloated. Drink plenty of fluids but you should avoid alcoholic beverages for 24 hours. If you had a esophageal dilation, please see attached instructions for diet.   ° °ACTIVITY: Your care partner should take you home directly after the procedure. You should plan to take it easy, moving slowly for the rest of the day. You can resume normal activity the day after the procedure however YOU SHOULD NOT DRIVE, use power tools, machinery or perform tasks that involve climbing or major physical exertion for 24 hours (because of the sedation medicines used during the test).  ° °SYMPTOMS TO REPORT IMMEDIATELY: °A gastroenterologist can be reached at any hour. Please call [phone]  for any of the following symptoms:  °Following lower endoscopy (colonoscopy, flexible sigmoidoscopy) °Excessive amounts of blood in the stool  °Significant tenderness, worsening of abdominal pains  °Swelling of the abdomen that is new, acute  °Fever of 100° or  higher  °Following upper endoscopy (EGD, EUS, ERCP, esophageal dilation) °Vomiting of blood or coffee ground material  °New, significant abdominal pain  °New, significant chest pain or pain under the shoulder blades  °Painful or persistently difficult swallowing  °New shortness of breath  °Black, tarry-looking or red, bloody stools ° °FOLLOW UP:  °If any biopsies were taken you will be contacted by phone or by letter within the next 1-3 weeks. Call [phone]  if you have not heard about the biopsies in 3 weeks.  °Please also call with any specific questions about appointments or follow up tests. ° °

## 2018-09-05 NOTE — Anesthesia Procedure Notes (Signed)
Procedure Name: Intubation Date/Time: 09/05/2018 7:42 AM Performed by: Glory Buff, CRNA Pre-anesthesia Checklist: Patient identified, Emergency Drugs available, Suction available and Patient being monitored Patient Re-evaluated:Patient Re-evaluated prior to induction Oxygen Delivery Method: Circle system utilized Preoxygenation: Pre-oxygenation with 100% oxygen Induction Type: IV induction Ventilation: Mask ventilation without difficulty Laryngoscope Size: Miller and 3 Grade View: Grade I Tube type: Oral Tube size: 7.5 mm Number of attempts: 1 Airway Equipment and Method: Stylet and Oral airway Placement Confirmation: ETT inserted through vocal cords under direct vision,  positive ETCO2 and breath sounds checked- equal and bilateral Secured at: 20 cm Tube secured with: Tape Dental Injury: Teeth and Oropharynx as per pre-operative assessment

## 2018-09-05 NOTE — Telephone Encounter (Signed)
Continue with the levaquin order. He's been on it several times for this biliary issue.

## 2018-09-05 NOTE — Telephone Encounter (Signed)
Rx sent to pharmacy   

## 2018-09-05 NOTE — Anesthesia Preprocedure Evaluation (Addendum)
Anesthesia Evaluation  Patient identified by MRN, date of birth, ID band Patient awake    Reviewed: Allergy & Precautions, NPO status , Patient's Chart, lab work & pertinent test results, reviewed documented beta blocker date and time   Airway Mallampati: III  TM Distance: >3 FB Neck ROM: Full    Dental no notable dental hx. (+) Edentulous Lower, Edentulous Upper   Pulmonary neg pulmonary ROS, former smoker,    Pulmonary exam normal breath sounds clear to auscultation       Cardiovascular hypertension, Pt. on home beta blockers and Pt. on medications Normal cardiovascular exam+ dysrhythmias Atrial Fibrillation + pacemaker  Rhythm:Irregular Rate:Normal  TTE 06/2018 - Left ventricle: The cavity size was normal. There was moderate concentric hypertrophy. Systolic function was normal. The estimated ejection fraction was in the range of 55% to 60%. Wall motion was normal; there were no regional wall motion abnormalities. - Aortic valve: There was trivial regurgitation. - Mitral valve: Mildly calcified annulus. There was mild   regurgitation. - Left atrium: The atrium was moderately dilated.   Neuro/Psych TIAnegative psych ROS   GI/Hepatic GERD  ,(+) Cirrhosis       ,   Endo/Other  negative endocrine ROS  Renal/GU Renal InsufficiencyRenal disease  negative genitourinary   Musculoskeletal negative musculoskeletal ROS (+)   Abdominal   Peds  Hematology negative hematology ROS (+)   Anesthesia Other Findings Bile duct stone On eliquis, last dose 09/02/18  Reproductive/Obstetrics                            Anesthesia Physical Anesthesia Plan  ASA: III  Anesthesia Plan: General   Post-op Pain Management:    Induction: Intravenous  PONV Risk Score and Plan: 2 and Ondansetron, Dexamethasone and Treatment may vary due to age or medical condition  Airway Management Planned: Oral  ETT  Additional Equipment:   Intra-op Plan:   Post-operative Plan: Extubation in OR  Informed Consent: I have reviewed the patients History and Physical, chart, labs and discussed the procedure including the risks, benefits and alternatives for the proposed anesthesia with the patient or authorized representative who has indicated his/her understanding and acceptance.   Dental advisory given  Plan Discussed with: CRNA  Anesthesia Plan Comments:         Anesthesia Quick Evaluation

## 2018-09-05 NOTE — Anesthesia Postprocedure Evaluation (Signed)
Anesthesia Post Note  Patient: Ernest Parker  Procedure(s) Performed: ENDOSCOPIC RETROGRADE CHOLANGIOPANCREATOGRAPHY (ERCP) WITH PROPOFOL (N/A ) SPHINCTEROTOMY BALLOON DILATION (N/A ) STONE EXTRACTION WITH BASKET STENT REMOVAL BILIARY STENT PLACEMENT (N/A )     Patient location during evaluation: Endoscopy Anesthesia Type: General Level of consciousness: sedated Pain management: pain level controlled Vital Signs Assessment: post-procedure vital signs reviewed and stable Respiratory status: spontaneous breathing Cardiovascular status: stable Postop Assessment: no apparent nausea or vomiting Anesthetic complications: no    Last Vitals:  Vitals:   09/05/18 0656 09/05/18 0921  BP: (!) 156/94 126/62  Pulse: (!) 106 63  Resp: (!) 26 19  Temp: 36.6 C   SpO2: 98% 100%    Last Pain:  Vitals:   09/05/18 0921  TempSrc:   PainSc: 0-No pain   Pain Goal:                 Huston Foley

## 2018-09-05 NOTE — Telephone Encounter (Signed)
Ok, d/c the previous cipro and flagyl. Instead prescribe levaquin 500mg  once daily. Disp 14 pills, no refills.  thanks

## 2018-09-06 ENCOUNTER — Encounter (HOSPITAL_COMMUNITY): Payer: Self-pay | Admitting: Gastroenterology

## 2018-09-06 ENCOUNTER — Telehealth: Payer: Self-pay | Admitting: Gastroenterology

## 2018-09-06 NOTE — Telephone Encounter (Signed)
Pts daughter is concerned, states her dad was placed on levaquin a few weeks ago and it did not clear up the infection. Reports he was still having a discharge that had an odor to it. She is calling concerned because he was placed on Levaquin again yesterday and she just wants to make sure Dr. Ardis Hughs feels this is strong enough to clear up the infection. Please advise.

## 2018-09-06 NOTE — Telephone Encounter (Signed)
Pt was prescribed Levaquin 12/26.  Pt's daughter called and states that pt has not recovered from previous infection (weeks ago) when taking Levaquin.  Pt's daughter is concerned about antibiotic resistance.  Please advise.

## 2018-09-06 NOTE — Telephone Encounter (Signed)
Yes, it is strong enough to at least keeping the infection from getting worse.  The infection will probably not ever be completely 'cured' until he has his GB removed.

## 2018-09-06 NOTE — Telephone Encounter (Signed)
Spoke with pts daughter and she is aware. 

## 2018-09-09 ENCOUNTER — Telehealth: Payer: Self-pay | Admitting: Gastroenterology

## 2018-09-09 ENCOUNTER — Other Ambulatory Visit: Payer: Self-pay | Admitting: Internal Medicine

## 2018-09-09 ENCOUNTER — Other Ambulatory Visit (HOSPITAL_COMMUNITY): Payer: Self-pay | Admitting: Internal Medicine

## 2018-09-09 DIAGNOSIS — K8050 Calculus of bile duct without cholangitis or cholecystitis without obstruction: Secondary | ICD-10-CM

## 2018-09-09 DIAGNOSIS — Z9889 Other specified postprocedural states: Secondary | ICD-10-CM

## 2018-09-09 NOTE — Telephone Encounter (Signed)
Eliquis 2.5mg  refill request received; pt is 84 yrs old, wt-99.5kg, Crea-1.71 on 08/13/18, last seen by Chanetta Marshall on 08/10/18; will send in refill to requested pharmacy.

## 2018-09-09 NOTE — Telephone Encounter (Signed)
I have reviewed the chart.  Complicated case with extensive interventions. 1.  I have reviewed ERCP from 4 days ago.  Internal biliary stent (which they would not see) in place 2.  Was placed on antibiotics empirically.  Continue these please 3.  In terms of external drain function, this is under the guidance of interventional radiology.  If there is no significant output, please contact interventional radiology ASAP to interrogate the percutaneous tube (which was done earlier this month and found to be in good position). 4.  If the patient were to develop jaundice, significant pain, or fever, proceed to emergency room ASAP. 5.  Nothing additional specific from GI medicine to add at this point.  Upcoming plans for surgery noted.  I will forward a copy of this note to Dr. Brantley Stage.  Thanks

## 2018-09-09 NOTE — Telephone Encounter (Signed)
The pt has an appt for 12/31 at 830 am to arrive at 22 am.  The daughter of the pt was advised and is also waiting for Dr Brantley Stage to call back.

## 2018-09-09 NOTE — Telephone Encounter (Signed)
The daughter has called because she is concerned about GB stone.  For the past 2-3 days the drainage from the GB stent (ERCP on 12/26) has decreased dramatically.  The daughter has called Dr Brantley Stage as well.  She wants to be sure that a decrease in drainage is normal. He is still taking antibiotics since ERCP on 12/26.  Dr Ardis Hughs is off all week, Dr Henrene Pastor you are Doc of the Day can you please review and advise?

## 2018-09-09 NOTE — Telephone Encounter (Signed)
Order in Price sent to [place] at [facility]

## 2018-09-09 NOTE — Telephone Encounter (Signed)
Ernest Parker from [facility] called and would like for you to fax over the order for the drain check for the pt that you just ordered fax numb [phone]

## 2018-09-09 NOTE — Telephone Encounter (Signed)
Call placed to IR to get an appt for the pt ASAP.  Also the pt's daughter has been advised of the ED req's.  I will call the pt's daughter back with IR appt.

## 2018-09-10 ENCOUNTER — Other Ambulatory Visit (HOSPITAL_COMMUNITY): Payer: Self-pay | Admitting: Internal Medicine

## 2018-09-10 ENCOUNTER — Encounter (HOSPITAL_COMMUNITY): Payer: Self-pay | Admitting: Interventional Radiology

## 2018-09-10 ENCOUNTER — Ambulatory Visit (HOSPITAL_COMMUNITY)
Admission: RE | Admit: 2018-09-10 | Discharge: 2018-09-10 | Disposition: A | Discharge status: Home / Self Care | Payer: Medicare Other | Source: Ambulatory Visit | Attending: Internal Medicine | Admitting: Internal Medicine

## 2018-09-10 DIAGNOSIS — K8050 Calculus of bile duct without cholangitis or cholecystitis without obstruction: Secondary | ICD-10-CM

## 2018-09-10 DIAGNOSIS — K8070 Calculus of gallbladder and bile duct without cholecystitis without obstruction: Secondary | ICD-10-CM | POA: Insufficient documentation

## 2018-09-10 DIAGNOSIS — T82330D Leakage of aortic (bifurcation) graft (replacement), subsequent encounter: Secondary | ICD-10-CM

## 2018-09-10 DIAGNOSIS — Z4682 Encounter for fitting and adjustment of non-vascular catheter: Secondary | ICD-10-CM | POA: Diagnosis not present

## 2018-09-10 DIAGNOSIS — IMO0001 Reserved for inherently not codable concepts without codable children: Secondary | ICD-10-CM

## 2018-09-10 HISTORY — PX: IR CHOLANGIOGRAM EXISTING TUBE: IMG6040

## 2018-09-10 MED ORDER — IOPAMIDOL (ISOVUE-300) INJECTION 61%
INTRAVENOUS | Status: AC
  Administered 2018-09-10: 10 mL
  Filled 2018-09-10: qty 50

## 2018-09-10 NOTE — Procedures (Signed)
Interventional Radiology Procedure Note  Procedure:  Through the tube cholangiogram of cholecystostomy.  Findings: Multiple retained stones within gallbladder, cystic duct, CBD, hepatic duct.   Plastic biliary duct is patent, with contrast traversing the ductal system to the duodenum.   Complications: None  Recommendations:  - Maintain to gravity bag - Will schedule routine exchange in January.  - agree with surgical consult, per patient and family this is pending. - Do not submerge - Routine drain care   Signed,  Dulcy Fanny. Earleen Newport, DO

## 2018-09-16 ENCOUNTER — Telehealth: Payer: Self-pay | Admitting: Gastroenterology

## 2018-09-16 NOTE — Telephone Encounter (Signed)
Pt's daughter, Baker Janus said that pt was referred to [facility] by Dr. Brantley Stage. for gall bladder removal.  Dr, Ardis Hughs has prescribed pt levofloxacin.  Pt has not heard from [facility] and is almost out of levofloxacin.  Pt needs refill and advice.

## 2018-09-17 DIAGNOSIS — K8309 Other cholangitis: Secondary | ICD-10-CM | POA: Diagnosis not present

## 2018-09-17 DIAGNOSIS — Z8546 Personal history of malignant neoplasm of prostate: Secondary | ICD-10-CM | POA: Diagnosis not present

## 2018-09-17 DIAGNOSIS — I4891 Unspecified atrial fibrillation: Secondary | ICD-10-CM | POA: Diagnosis not present

## 2018-09-17 DIAGNOSIS — I10 Essential (primary) hypertension: Secondary | ICD-10-CM | POA: Diagnosis not present

## 2018-09-17 DIAGNOSIS — K8040 Calculus of bile duct with cholecystitis, unspecified, without obstruction: Secondary | ICD-10-CM | POA: Diagnosis not present

## 2018-09-17 DIAGNOSIS — N320 Bladder-neck obstruction: Secondary | ICD-10-CM | POA: Diagnosis not present

## 2018-09-17 NOTE — Telephone Encounter (Signed)
Dr Ardis Hughs,  I spoke to patients daughter who states Dr Brantley Stage cancelled 10/08/18 GB surgery stating he was not qualified for an open GB removal. He referred him to [facility], but she has not heard any response from them. He has 2 days left on Levaquin and no surgery has been scheduled. Does he need a refill on the Levaquin. Also if at all possible she would like to have the surgery done through [facility]. Please advise.  Thanks, [employer]

## 2018-09-18 ENCOUNTER — Other Ambulatory Visit: Payer: Self-pay | Admitting: Gastroenterology

## 2018-09-18 MED ORDER — LEVOFLOXACIN 500 MG PO TABS: 500.0000 mg | ORAL_TABLET | Freq: Every day | ORAL | 0 refills | Status: DC

## 2018-09-18 NOTE — Telephone Encounter (Signed)
Patient daughter calling back requesting to speak with someone regarding this.

## 2018-09-18 NOTE — Telephone Encounter (Signed)
Dr Ardis Hughs, The patients daughter is calling back again questioning if the patient needs to restart Levaquin. She seems to be very concerned that he is not on this antibiotic he will become infected again. Please advise.  Thanks, [employer]

## 2018-09-18 NOTE — Telephone Encounter (Signed)
I do not know if being off abx will necessarily lead to infection however given his history I think it is probably safest to continue levaquin 500mg  once daily for now. Please prescribe levaquin 500mg  pills, one pill once daily, disp 14 pills, no refills. He has appt at [facility] next week (surgery dept) and should keep that appt.

## 2018-09-18 NOTE — Telephone Encounter (Signed)
Spoke to Daughter to inform her that Levaquin has been sent to the pharmacy. Spoke to [facility]. Patient has an appointment for a consultation with Dr Angela Nevin on 09/26/18 at 3pm.

## 2018-09-19 ENCOUNTER — Telehealth: Payer: Self-pay | Admitting: Gastroenterology

## 2018-09-19 NOTE — Telephone Encounter (Signed)
Patient daughter wants to thank Claiborne Billings for yesterday and also states that she needs saline flushes refilled

## 2018-09-20 ENCOUNTER — Telehealth: Payer: Self-pay | Admitting: Gastroenterology

## 2018-09-20 ENCOUNTER — Telehealth: Payer: Self-pay | Admitting: Student

## 2018-09-20 MED FILL — NORMAL SALINE FLUSH SYRINGE: 0.9 | 30 days supply | Qty: 300 | Fill #0

## 2018-09-20 NOTE — Telephone Encounter (Signed)
Faxed prescription to [facility] outpatient pharmacy [phone])- normal saline flush syringe, dispense 300 syringes with 2 refills.  Bea Graff Louk, PA-C 09/20/2018, 9:40 AM

## 2018-09-23 ENCOUNTER — Telehealth: Payer: Self-pay | Admitting: Internal Medicine

## 2018-09-23 NOTE — Telephone Encounter (Signed)
Medical records received from [facility]. 09/23/18 vlm

## 2018-09-26 ENCOUNTER — Ambulatory Visit (INDEPENDENT_AMBULATORY_CARE_PROVIDER_SITE_OTHER): Payer: Medicare Other

## 2018-09-26 DIAGNOSIS — I495 Sick sinus syndrome: Secondary | ICD-10-CM

## 2018-09-26 DIAGNOSIS — K8046 Calculus of bile duct with acute and chronic cholecystitis without obstruction: Secondary | ICD-10-CM | POA: Diagnosis not present

## 2018-09-26 DIAGNOSIS — K819 Cholecystitis, unspecified: Secondary | ICD-10-CM | POA: Diagnosis not present

## 2018-09-26 DIAGNOSIS — R001 Bradycardia, unspecified: Secondary | ICD-10-CM | POA: Diagnosis not present

## 2018-09-26 DIAGNOSIS — I712 Thoracic aortic aneurysm, without rupture: Secondary | ICD-10-CM | POA: Diagnosis not present

## 2018-09-26 DIAGNOSIS — K5080 Crohn's disease of both small and large intestine without complications: Secondary | ICD-10-CM | POA: Diagnosis not present

## 2018-09-26 DIAGNOSIS — N183 Chronic kidney disease, stage 3 (moderate): Secondary | ICD-10-CM | POA: Diagnosis not present

## 2018-09-26 DIAGNOSIS — I129 Hypertensive chronic kidney disease with stage 1 through stage 4 chronic kidney disease, or unspecified chronic kidney disease: Secondary | ICD-10-CM | POA: Diagnosis not present

## 2018-09-28 LAB — CUP PACEART REMOTE DEVICE CHECK
Battery Remaining Percentage: 95.5 %
Battery Voltage: 3.01 V
Brady Statistic AP VP Percent: 1 %
Brady Statistic AP VS Percent: 85 %
Brady Statistic AS VP Percent: 1 %
Brady Statistic AS VS Percent: 15 %
Brady Statistic RA Percent Paced: 81 %
Brady Statistic RV Percent Paced: 1 %
Date Time Interrogation Session: 20200116070017
Implantable Lead Implant Date: 20190408
Implantable Lead Implant Date: 20190408
Implantable Lead Location: 753859
Implantable Lead Location: 753860
Implantable Pulse Generator Implant Date: 20190408
Lead Channel Impedance Value: 460 Ohm
Lead Channel Pacing Threshold Amplitude: 1 V
Lead Channel Pacing Threshold Amplitude: 2 V
Lead Channel Pacing Threshold Pulse Width: 0.5 ms
Lead Channel Pacing Threshold Pulse Width: 0.5 ms
Lead Channel Sensing Intrinsic Amplitude: 12 mV
Lead Channel Setting Pacing Amplitude: 1.25 V
Lead Channel Setting Pacing Amplitude: 2.5 V
Lead Channel Setting Pacing Pulse Width: 0.5 ms
Lead Channel Setting Sensing Sensitivity: 2 mV
MDC IDC MSMT BATTERY REMAINING LONGEVITY: 112 mo
MDC IDC MSMT LEADCHNL RA IMPEDANCE VALUE: 600 Ohm
MDC IDC MSMT LEADCHNL RA SENSING INTR AMPL: 2.4 mV
Pulse Gen Model: 2272
Pulse Gen Serial Number: 9004195

## 2018-10-02 DIAGNOSIS — R338 Other retention of urine: Secondary | ICD-10-CM | POA: Diagnosis not present

## 2018-10-07 DIAGNOSIS — K8042 Calculus of bile duct with acute cholecystitis without obstruction: Secondary | ICD-10-CM | POA: Diagnosis not present

## 2018-10-08 ENCOUNTER — Ambulatory Visit (HOSPITAL_COMMUNITY)
Admission: RE | Admit: 2018-10-08 | Discharge: 2018-10-08 | Disposition: A | Discharge status: Home / Self Care | Payer: Medicare Other | Source: Ambulatory Visit | Attending: Internal Medicine | Admitting: Internal Medicine

## 2018-10-08 ENCOUNTER — Inpatient Hospital Stay: Admit: 2018-10-08 | Payer: Medicare Other | Admitting: Surgery

## 2018-10-08 ENCOUNTER — Encounter (HOSPITAL_COMMUNITY): Payer: Self-pay | Admitting: Interventional Radiology

## 2018-10-08 DIAGNOSIS — Z4803 Encounter for change or removal of drains: Secondary | ICD-10-CM | POA: Diagnosis not present

## 2018-10-08 DIAGNOSIS — K8020 Calculus of gallbladder without cholecystitis without obstruction: Secondary | ICD-10-CM | POA: Diagnosis not present

## 2018-10-08 DIAGNOSIS — K8050 Calculus of bile duct without cholangitis or cholecystitis without obstruction: Secondary | ICD-10-CM | POA: Diagnosis not present

## 2018-10-08 HISTORY — PX: IR EXCHANGE BILIARY DRAIN: IMG6046

## 2018-10-08 SURGERY — LAPAROSCOPIC CHOLECYSTECTOMY
Anesthesia: General

## 2018-10-08 MED ORDER — LIDOCAINE HCL 1 % IJ SOLN
INTRAMUSCULAR | Status: AC | PRN
  Administered 2018-10-08: 5 mL

## 2018-10-08 MED ORDER — IOPAMIDOL (ISOVUE-300) INJECTION 61%
INTRAVENOUS | Status: AC
  Filled 2018-10-08: qty 50

## 2018-10-08 MED ORDER — LIDOCAINE HCL 1 % IJ SOLN
INTRAMUSCULAR | Status: AC
  Filled 2018-10-08: qty 20

## 2018-10-08 NOTE — Procedures (Signed)
Pre procedural Dx: Chronic chole tube Post procedural Dx: Same  Successful fluoro guided chole tube exchange. Note again made of extensive cholecystitis and choledocholithiasis.   EBL: None Complications: None immediate  PLAN: - Maintain chole tube to gravity bag until patient undergoes definitive cholecystectomy scheduled for later next month. - If cholecystectomy is postponed, recommend routine fluoro guided exchange in 2 months (early April).   Ronny Bacon, MD Pager #: [phone]

## 2018-10-21 MED FILL — NORMAL SALINE FLUSH SYRINGE: 0.9 | 45 days supply | Qty: 450 | Fill #0

## 2018-10-28 ENCOUNTER — Other Ambulatory Visit (HOSPITAL_COMMUNITY): Payer: Self-pay | Admitting: General Surgery

## 2018-10-28 ENCOUNTER — Other Ambulatory Visit: Payer: Self-pay | Admitting: General Surgery

## 2018-10-28 DIAGNOSIS — K819 Cholecystitis, unspecified: Secondary | ICD-10-CM | POA: Diagnosis not present

## 2018-10-28 DIAGNOSIS — K8050 Calculus of bile duct without cholangitis or cholecystitis without obstruction: Secondary | ICD-10-CM | POA: Diagnosis not present

## 2018-10-28 DIAGNOSIS — Z01818 Encounter for other preprocedural examination: Secondary | ICD-10-CM | POA: Diagnosis not present

## 2018-10-29 DIAGNOSIS — K819 Cholecystitis, unspecified: Secondary | ICD-10-CM | POA: Diagnosis not present

## 2018-10-29 DIAGNOSIS — K8050 Calculus of bile duct without cholangitis or cholecystitis without obstruction: Secondary | ICD-10-CM | POA: Diagnosis not present

## 2018-10-30 DIAGNOSIS — R338 Other retention of urine: Secondary | ICD-10-CM | POA: Diagnosis not present

## 2018-11-04 DIAGNOSIS — Z931 Gastrostomy status: Secondary | ICD-10-CM | POA: Diagnosis not present

## 2018-11-04 DIAGNOSIS — K8050 Calculus of bile duct without cholangitis or cholecystitis without obstruction: Secondary | ICD-10-CM | POA: Diagnosis not present

## 2018-11-04 DIAGNOSIS — K8070 Calculus of gallbladder and bile duct without cholecystitis without obstruction: Secondary | ICD-10-CM | POA: Diagnosis not present

## 2018-11-04 DIAGNOSIS — R0989 Other specified symptoms and signs involving the circulatory and respiratory systems: Secondary | ICD-10-CM | POA: Diagnosis not present

## 2018-11-04 DIAGNOSIS — R918 Other nonspecific abnormal finding of lung field: Secondary | ICD-10-CM | POA: Diagnosis not present

## 2018-11-06 ENCOUNTER — Ambulatory Visit (HOSPITAL_COMMUNITY): Admission: RE | Admit: 2018-11-06 | Payer: Medicare Other | Source: Ambulatory Visit

## 2018-11-07 DIAGNOSIS — I4891 Unspecified atrial fibrillation: Secondary | ICD-10-CM | POA: Diagnosis not present

## 2018-11-07 DIAGNOSIS — K219 Gastro-esophageal reflux disease without esophagitis: Secondary | ICD-10-CM | POA: Insufficient documentation

## 2018-11-07 DIAGNOSIS — N189 Chronic kidney disease, unspecified: Secondary | ICD-10-CM | POA: Diagnosis not present

## 2018-11-07 DIAGNOSIS — I129 Hypertensive chronic kidney disease with stage 1 through stage 4 chronic kidney disease, or unspecified chronic kidney disease: Secondary | ICD-10-CM | POA: Diagnosis not present

## 2018-11-07 DIAGNOSIS — Z950 Presence of cardiac pacemaker: Secondary | ICD-10-CM | POA: Diagnosis not present

## 2018-11-07 DIAGNOSIS — N400 Enlarged prostate without lower urinary tract symptoms: Secondary | ICD-10-CM | POA: Insufficient documentation

## 2018-11-07 DIAGNOSIS — Z9889 Other specified postprocedural states: Secondary | ICD-10-CM | POA: Diagnosis not present

## 2018-11-07 DIAGNOSIS — Z79899 Other long term (current) drug therapy: Secondary | ICD-10-CM | POA: Diagnosis not present

## 2018-11-07 DIAGNOSIS — Z87891 Personal history of nicotine dependence: Secondary | ICD-10-CM | POA: Diagnosis not present

## 2018-11-07 DIAGNOSIS — K8050 Calculus of bile duct without cholangitis or cholecystitis without obstruction: Secondary | ICD-10-CM | POA: Diagnosis not present

## 2018-11-07 DIAGNOSIS — K831 Obstruction of bile duct: Secondary | ICD-10-CM | POA: Diagnosis not present

## 2018-11-07 DIAGNOSIS — Z7901 Long term (current) use of anticoagulants: Secondary | ICD-10-CM | POA: Diagnosis not present

## 2018-11-07 DIAGNOSIS — Z8546 Personal history of malignant neoplasm of prostate: Secondary | ICD-10-CM | POA: Diagnosis not present

## 2018-11-22 DIAGNOSIS — R338 Other retention of urine: Secondary | ICD-10-CM | POA: Diagnosis not present

## 2018-11-25 DIAGNOSIS — Z950 Presence of cardiac pacemaker: Secondary | ICD-10-CM | POA: Diagnosis not present

## 2018-11-25 DIAGNOSIS — E785 Hyperlipidemia, unspecified: Secondary | ICD-10-CM | POA: Diagnosis not present

## 2018-11-25 DIAGNOSIS — I4891 Unspecified atrial fibrillation: Secondary | ICD-10-CM | POA: Diagnosis not present

## 2018-11-25 DIAGNOSIS — K831 Obstruction of bile duct: Secondary | ICD-10-CM | POA: Diagnosis not present

## 2018-11-25 DIAGNOSIS — Z8546 Personal history of malignant neoplasm of prostate: Secondary | ICD-10-CM | POA: Diagnosis not present

## 2018-11-25 DIAGNOSIS — K8050 Calculus of bile duct without cholangitis or cholecystitis without obstruction: Secondary | ICD-10-CM | POA: Diagnosis not present

## 2018-11-25 DIAGNOSIS — I10 Essential (primary) hypertension: Secondary | ICD-10-CM | POA: Diagnosis not present

## 2018-11-25 DIAGNOSIS — K8070 Calculus of gallbladder and bile duct without cholecystitis without obstruction: Secondary | ICD-10-CM | POA: Diagnosis not present

## 2018-11-25 DIAGNOSIS — Z7901 Long term (current) use of anticoagulants: Secondary | ICD-10-CM | POA: Diagnosis not present

## 2018-11-29 MED FILL — NORMAL SALINE FLUSH SYRINGE: 0.9 | 45 days supply | Qty: 450 | Fill #0

## 2018-12-10 DIAGNOSIS — K8010 Calculus of gallbladder with chronic cholecystitis without obstruction: Secondary | ICD-10-CM | POA: Diagnosis not present

## 2018-12-10 DIAGNOSIS — Z4682 Encounter for fitting and adjustment of non-vascular catheter: Secondary | ICD-10-CM | POA: Diagnosis not present

## 2018-12-10 DIAGNOSIS — K819 Cholecystitis, unspecified: Secondary | ICD-10-CM | POA: Diagnosis not present

## 2018-12-23 DIAGNOSIS — K8040 Calculus of bile duct with cholecystitis, unspecified, without obstruction: Secondary | ICD-10-CM | POA: Diagnosis not present

## 2018-12-24 DIAGNOSIS — R338 Other retention of urine: Secondary | ICD-10-CM | POA: Diagnosis not present

## 2018-12-26 ENCOUNTER — Other Ambulatory Visit: Payer: Self-pay

## 2018-12-26 ENCOUNTER — Ambulatory Visit (INDEPENDENT_AMBULATORY_CARE_PROVIDER_SITE_OTHER): Payer: Medicare Other | Admitting: *Deleted

## 2018-12-26 DIAGNOSIS — I495 Sick sinus syndrome: Secondary | ICD-10-CM

## 2018-12-26 LAB — CUP PACEART REMOTE DEVICE CHECK
Battery Remaining Longevity: 113 mo
Battery Remaining Percentage: 95.5 %
Battery Voltage: 3.01 V
Brady Statistic AP VP Percent: 1 %
Brady Statistic AP VS Percent: 92 %
Brady Statistic AS VP Percent: 1 %
Brady Statistic AS VS Percent: 6.2 %
Brady Statistic RA Percent Paced: 87 %
Brady Statistic RV Percent Paced: 2 %
Date Time Interrogation Session: 20200415080014
Implantable Lead Implant Date: 20190408
Implantable Lead Implant Date: 20190408
Implantable Lead Location: 753859
Implantable Lead Location: 753860
Implantable Pulse Generator Implant Date: 20190408
Lead Channel Impedance Value: 490 Ohm
Lead Channel Impedance Value: 540 Ohm
Lead Channel Pacing Threshold Amplitude: 0.625 V
Lead Channel Pacing Threshold Pulse Width: 0.5 ms
Lead Channel Sensing Intrinsic Amplitude: 1.6 mV
Lead Channel Sensing Intrinsic Amplitude: 12 mV
Lead Channel Setting Pacing Amplitude: 1.625
Lead Channel Setting Pacing Amplitude: 2.5 V
Lead Channel Setting Pacing Pulse Width: 0.5 ms
Lead Channel Setting Sensing Sensitivity: 2 mV
Pulse Gen Model: 2272
Pulse Gen Serial Number: 9004195

## 2019-01-01 ENCOUNTER — Encounter: Payer: Self-pay | Admitting: Cardiology

## 2019-01-01 NOTE — Progress Notes (Signed)
Remote pacemaker transmission.   

## 2019-01-03 DIAGNOSIS — R338 Other retention of urine: Secondary | ICD-10-CM | POA: Diagnosis present

## 2019-01-03 DIAGNOSIS — N401 Enlarged prostate with lower urinary tract symptoms: Secondary | ICD-10-CM | POA: Diagnosis present

## 2019-01-03 DIAGNOSIS — K219 Gastro-esophageal reflux disease without esophagitis: Secondary | ICD-10-CM | POA: Diagnosis present

## 2019-01-03 DIAGNOSIS — Z9049 Acquired absence of other specified parts of digestive tract: Secondary | ICD-10-CM | POA: Diagnosis not present

## 2019-01-03 DIAGNOSIS — Z79899 Other long term (current) drug therapy: Secondary | ICD-10-CM | POA: Diagnosis not present

## 2019-01-03 DIAGNOSIS — I495 Sick sinus syndrome: Secondary | ICD-10-CM | POA: Diagnosis present

## 2019-01-03 DIAGNOSIS — H9190 Unspecified hearing loss, unspecified ear: Secondary | ICD-10-CM | POA: Diagnosis present

## 2019-01-03 DIAGNOSIS — Z880 Allergy status to penicillin: Secondary | ICD-10-CM | POA: Diagnosis not present

## 2019-01-03 DIAGNOSIS — E785 Hyperlipidemia, unspecified: Secondary | ICD-10-CM | POA: Diagnosis present

## 2019-01-03 DIAGNOSIS — R109 Unspecified abdominal pain: Secondary | ICD-10-CM | POA: Diagnosis not present

## 2019-01-03 DIAGNOSIS — N183 Chronic kidney disease, stage 3 (moderate): Secondary | ICD-10-CM | POA: Diagnosis present

## 2019-01-03 DIAGNOSIS — Z950 Presence of cardiac pacemaker: Secondary | ICD-10-CM | POA: Diagnosis not present

## 2019-01-03 DIAGNOSIS — Z7901 Long term (current) use of anticoagulants: Secondary | ICD-10-CM | POA: Diagnosis not present

## 2019-01-03 DIAGNOSIS — K8012 Calculus of gallbladder with acute and chronic cholecystitis without obstruction: Secondary | ICD-10-CM | POA: Diagnosis not present

## 2019-01-03 DIAGNOSIS — I482 Chronic atrial fibrillation, unspecified: Secondary | ICD-10-CM | POA: Diagnosis present

## 2019-01-03 DIAGNOSIS — K8040 Calculus of bile duct with cholecystitis, unspecified, without obstruction: Secondary | ICD-10-CM | POA: Diagnosis not present

## 2019-01-03 DIAGNOSIS — K8042 Calculus of bile duct with acute cholecystitis without obstruction: Secondary | ICD-10-CM | POA: Diagnosis not present

## 2019-01-03 DIAGNOSIS — G8918 Other acute postprocedural pain: Secondary | ICD-10-CM | POA: Diagnosis not present

## 2019-01-03 DIAGNOSIS — I129 Hypertensive chronic kidney disease with stage 1 through stage 4 chronic kidney disease, or unspecified chronic kidney disease: Secondary | ICD-10-CM | POA: Diagnosis present

## 2019-01-03 DIAGNOSIS — E7800 Pure hypercholesterolemia, unspecified: Secondary | ICD-10-CM | POA: Diagnosis present

## 2019-01-03 DIAGNOSIS — Z8546 Personal history of malignant neoplasm of prostate: Secondary | ICD-10-CM | POA: Diagnosis not present

## 2019-01-03 DIAGNOSIS — K8062 Calculus of gallbladder and bile duct with acute cholecystitis without obstruction: Secondary | ICD-10-CM | POA: Diagnosis present

## 2019-01-03 DIAGNOSIS — Z87891 Personal history of nicotine dependence: Secondary | ICD-10-CM | POA: Diagnosis not present

## 2019-01-03 DIAGNOSIS — Z5331 Laparoscopic surgical procedure converted to open procedure: Secondary | ICD-10-CM | POA: Diagnosis not present

## 2019-01-03 DIAGNOSIS — K769 Liver disease, unspecified: Secondary | ICD-10-CM | POA: Diagnosis not present

## 2019-01-03 DIAGNOSIS — K660 Peritoneal adhesions (postprocedural) (postinfection): Secondary | ICD-10-CM | POA: Diagnosis not present

## 2019-01-15 DIAGNOSIS — R338 Other retention of urine: Secondary | ICD-10-CM | POA: Diagnosis not present

## 2019-01-15 DIAGNOSIS — I4891 Unspecified atrial fibrillation: Secondary | ICD-10-CM | POA: Diagnosis not present

## 2019-01-15 DIAGNOSIS — I10 Essential (primary) hypertension: Secondary | ICD-10-CM | POA: Diagnosis not present

## 2019-01-15 DIAGNOSIS — K811 Chronic cholecystitis: Secondary | ICD-10-CM | POA: Diagnosis not present

## 2019-01-15 DIAGNOSIS — N320 Bladder-neck obstruction: Secondary | ICD-10-CM | POA: Diagnosis not present

## 2019-01-22 DIAGNOSIS — R338 Other retention of urine: Secondary | ICD-10-CM | POA: Diagnosis not present

## 2019-01-23 DIAGNOSIS — Z48815 Encounter for surgical aftercare following surgery on the digestive system: Secondary | ICD-10-CM | POA: Diagnosis not present

## 2019-02-17 DIAGNOSIS — Z1159 Encounter for screening for other viral diseases: Secondary | ICD-10-CM | POA: Diagnosis not present

## 2019-02-17 DIAGNOSIS — K8050 Calculus of bile duct without cholangitis or cholecystitis without obstruction: Secondary | ICD-10-CM | POA: Diagnosis not present

## 2019-02-17 DIAGNOSIS — Z01818 Encounter for other preprocedural examination: Secondary | ICD-10-CM | POA: Diagnosis not present

## 2019-02-20 DIAGNOSIS — R338 Other retention of urine: Secondary | ICD-10-CM | POA: Diagnosis not present

## 2019-02-20 DIAGNOSIS — C61 Malignant neoplasm of prostate: Secondary | ICD-10-CM | POA: Diagnosis not present

## 2019-02-24 ENCOUNTER — Telehealth: Payer: Self-pay | Admitting: Internal Medicine

## 2019-02-24 DIAGNOSIS — K838 Other specified diseases of biliary tract: Secondary | ICD-10-CM | POA: Diagnosis not present

## 2019-02-24 DIAGNOSIS — Z9889 Other specified postprocedural states: Secondary | ICD-10-CM | POA: Diagnosis not present

## 2019-02-24 DIAGNOSIS — Z4659 Encounter for fitting and adjustment of other gastrointestinal appliance and device: Secondary | ICD-10-CM | POA: Diagnosis not present

## 2019-02-24 DIAGNOSIS — Z950 Presence of cardiac pacemaker: Secondary | ICD-10-CM | POA: Diagnosis not present

## 2019-02-24 DIAGNOSIS — Z7901 Long term (current) use of anticoagulants: Secondary | ICD-10-CM | POA: Diagnosis not present

## 2019-02-24 DIAGNOSIS — I4891 Unspecified atrial fibrillation: Secondary | ICD-10-CM | POA: Diagnosis not present

## 2019-02-24 DIAGNOSIS — K831 Obstruction of bile duct: Secondary | ICD-10-CM | POA: Diagnosis not present

## 2019-02-24 DIAGNOSIS — Z9049 Acquired absence of other specified parts of digestive tract: Secondary | ICD-10-CM | POA: Diagnosis not present

## 2019-02-24 DIAGNOSIS — K8050 Calculus of bile duct without cholangitis or cholecystitis without obstruction: Secondary | ICD-10-CM | POA: Diagnosis not present

## 2019-02-24 NOTE — Telephone Encounter (Signed)
Pt takes Eliquis for afib with CHADS2VASc score of 5 (age x2, HTN, TIA). SCr 1.71, CrCl 38mL/min, pt appropriately on reduced dose Eliquis. Recommend only holding Eliquis for 24 hours prior to procedure due to history of TIA. If > 24 hour hold is required, will need MD input.

## 2019-02-24 NOTE — Telephone Encounter (Signed)
   Primary Cardiologist: [name], MD  Chart reviewed as part of pre-operative protocol coverage. Given past medical history and time since last visit, based on ACC/AHA guidelines, Ernest Parker would be at acceptable risk for the planned procedure without further cardiovascular testing. Easily getting > 4 mets of activity.   Pharmacy to review anticoagulation.   [place], [place] 02/24/2019, 1:09 PM

## 2019-02-24 NOTE — Telephone Encounter (Signed)
     [facility] Pre-operative Risk Assessment    Request for surgical clearance:  1. What type of surgery is being performed? TURP  2. When is this surgery scheduled? TBD  3. What type of clearance is required (medical clearance vs. Pharmacy clearance to hold med vs. Both)? Both  4. Are there any medications that need to be held prior to surgery and how long?Eliquis  5. Practice name and name of physician performing surgery? [facility] Dr Gloriann Loan  6. What is your office phone number [phone] ext [phone]   7.   What is your office fax number [phone]  8.   Anesthesia type (None, local, MAC, general) ? general   Gwendel Hanson 02/24/2019, 10:17 AM  _________________________________________________________________   (provider comments below)

## 2019-02-26 ENCOUNTER — Other Ambulatory Visit: Payer: Self-pay | Admitting: Urology

## 2019-03-04 ENCOUNTER — Other Ambulatory Visit (HOSPITAL_COMMUNITY): Payer: Medicare Other

## 2019-03-05 NOTE — Progress Notes (Addendum)
02/24/2019- Clearance  Noted in Epic and on chart from [facility], [place] about Eliquis.  12/25/2018- noted in [place] check  07/24/2018-noted in [employer] note with [employer]. Lynnell Jude, NP  07/02/2018- noted in Epic- EKG and ECHO  07/01/2018- noted in [place]

## 2019-03-05 NOTE — Patient Instructions (Addendum)
[facility]  03/05/2019   Your procedure is scheduled on: Monday 03/10/2019  Report to [facility] Main  Entrance              Report to admitting at   1135 AM               YOU NEED TO HAVE A COVID 19 TEST ON_______ @_______ , THIS TEST MUST BE DONE BEFORE SURGERY, COME TO [place].             ONCE YOUR COVID TEST IS COMPLETED, PLEASE BEGIN THE QUARANTINE INSTRUCTIONS AS OUTLINED IN YOUR HANDOUT.   Call this number if you have problems the morning of surgery [phone]    Remember: Do not eat food  :After Midnight.  May have clear liquids from midnight up until 0735 am then nothing until after surgery!    CLEAR LIQUID DIET   Foods Allowed                                                                     Foods Excluded  Coffee and tea, regular and decaf                             liquids that you cannot  Plain Jell-O in any flavor                                             see through such as: Fruit ices (not with fruit pulp)                                     milk, soups, orange juice  Iced Popsicles                                    All solid food Carbonated beverages, regular and diet                                    Cranberry, grape and apple juices Sports drinks like Gatorade Lightly seasoned clear broth or consume(fat free) Sugar, honey syrup  Sample Menu Breakfast                                Lunch                                     Supper Cranberry juice                    Beef broth  Chicken broth Jell-O                                     Grape juice                           Apple juice Coffee or tea                        Jell-O                                      Popsicle                                                Coffee or tea                        Coffee or tea  _____________________________________________________________________               BRUSH  YOUR TEETH MORNING OF SURGERY AND RINSE YOUR MOUTH OUT, NO CHEWING GUM CANDY OR MINTS.     Take these medicines the morning of surgery with A SIP OF WATER: Diltiazem (Cardiazem), Metoprolol succinate (Toprol XL), Pantoprazole (Protonix)                                 You may not have any metal on your body including hair pins and              piercings  Do not wear jewelry,  lotions, powders or perfumes, deodorant                         Men may shave face and neck.   Do not bring valuables to the hospital. [place].  Contacts, dentures or bridgework may not be worn into surgery.                    Please read over the following fact sheets you were given: _____________________________________________________________________          [facility] - Preparing for Surgery Before surgery, you can play an important role.  Because skin is not sterile, your skin needs to be as free of germs as possible.  You can reduce the number of germs on your skin by washing with CHG (chlorahexidine gluconate) soap before surgery.  CHG is an antiseptic cleaner which kills germs and bonds with the skin to continue killing germs even after washing. Please DO NOT use if you have an allergy to CHG or antibacterial soaps.  If your skin becomes reddened/irritated stop using the CHG and inform your nurse when you arrive at Short Stay. Do not shave (including legs and underarms) for at least 48 hours prior to the first CHG shower.  You may shave your face/neck. Please follow these instructions carefully:  1.  Shower with CHG Soap the night before surgery and the  morning of Surgery.  2.  If you choose to wash your hair, wash your hair first as usual with your  normal  shampoo.  3.  After you shampoo, rinse your hair and body thoroughly to remove the  shampoo.                           4.  Use CHG as you would any other liquid soap.  You can apply chg directly  to  the skin and wash                       Gently with a scrungie or clean washcloth.  5.  Apply the CHG Soap to your body ONLY FROM THE NECK DOWN.   Do not use on face/ open                           Wound or open sores. Avoid contact with eyes, ears mouth and genitals (private parts).                       Wash face,  Genitals (private parts) with your normal soap.             6.  Wash thoroughly, paying special attention to the area where your surgery  will be performed.  7.  Thoroughly rinse your body with warm water from the neck down.  8.  DO NOT shower/wash with your normal soap after using and rinsing off  the CHG Soap.                9.  Pat yourself dry with a clean towel.            10.  Wear clean pajamas.            11.  Place clean sheets on your bed the night of your first shower and do not  sleep with pets. Day of Surgery : Do not apply any lotions/deodorants the morning of surgery.  Please wear clean clothes to the hospital/surgery center.  FAILURE TO FOLLOW THESE INSTRUCTIONS MAY RESULT IN THE CANCELLATION OF YOUR SURGERY PATIENT SIGNATURE_________________________________  NURSE SIGNATURE__________________________________  ________________________________________________________________________  WHAT IS A BLOOD TRANSFUSION? Blood Transfusion Information  A transfusion is the replacement of blood or some of its parts. Blood is made up of multiple cells which provide different functions.  Red blood cells carry oxygen and are used for blood loss replacement.  White blood cells fight against infection.  Platelets control bleeding.  Plasma helps clot blood.  Other blood products are available for specialized needs, such as hemophilia or other clotting disorders. BEFORE THE TRANSFUSION  Who gives blood for transfusions?   Healthy volunteers who are fully evaluated to make sure their blood is safe. This is blood bank blood. Transfusion therapy is the safest it has ever  been in the practice of medicine. Before blood is taken from a donor, a complete history is taken to make sure that person has no history of diseases nor engages in risky social behavior (examples are intravenous drug use or sexual activity with multiple partners). The donor's travel history is screened to minimize risk of transmitting infections, such as malaria. The donated blood is tested for signs of infectious diseases, such as HIV and hepatitis. The blood is then tested to be sure it is compatible with you in order to minimize the chance of  a transfusion reaction. If you or a relative donates blood, this is often done in anticipation of surgery and is not appropriate for emergency situations. It takes many days to process the donated blood. RISKS AND COMPLICATIONS Although transfusion therapy is very safe and saves many lives, the main dangers of transfusion include:   Getting an infectious disease.  Developing a transfusion reaction. This is an allergic reaction to something in the blood you were given. Every precaution is taken to prevent this. The decision to have a blood transfusion has been considered carefully by your caregiver before blood is given. Blood is not given unless the benefits outweigh the risks. AFTER THE TRANSFUSION  Right after receiving a blood transfusion, you will usually feel much better and more energetic. This is especially true if your red blood cells have gotten low (anemic). The transfusion raises the level of the red blood cells which carry oxygen, and this usually causes an energy increase.  The nurse administering the transfusion will monitor you carefully for complications. HOME CARE INSTRUCTIONS  No special instructions are needed after a transfusion. You may find your energy is better. Speak with your caregiver about any limitations on activity for underlying diseases you may have. SEEK MEDICAL CARE IF:   Your condition is not improving after your  transfusion.  You develop redness or irritation at the intravenous (IV) site. SEEK IMMEDIATE MEDICAL CARE IF:  Any of the following symptoms occur over the next 12 hours:  Shaking chills.  You have a temperature by mouth above 102 F (38.9 C), not controlled by medicine.  Chest, back, or muscle pain.  People around you feel you are not acting correctly or are confused.  Shortness of breath or difficulty breathing.  Dizziness and fainting.  You get a rash or develop hives.  You have a decrease in urine output.  Your urine turns a dark color or changes to pink, red, or brown. Any of the following symptoms occur over the next 10 days:  You have a temperature by mouth above 102 F (38.9 C), not controlled by medicine.  Shortness of breath.  Weakness after normal activity.  The white part of the eye turns yellow (jaundice).  You have a decrease in the amount of urine or are urinating less often.  Your urine turns a dark color or changes to pink, red, or brown. Document Released: 08/25/2000 Document Revised: 11/20/2011 Document Reviewed: 04/13/2008 [facility] Patient Information 2014 [place], [place].  _______________________________________________________________________

## 2019-03-06 ENCOUNTER — Encounter (HOSPITAL_COMMUNITY)
Admission: RE | Admit: 2019-03-06 | Discharge: 2019-03-06 | Disposition: A | Discharge status: Home / Self Care | Payer: Medicare Other | Source: Ambulatory Visit | Attending: Urology | Admitting: Urology

## 2019-03-06 ENCOUNTER — Other Ambulatory Visit: Payer: Self-pay

## 2019-03-06 ENCOUNTER — Other Ambulatory Visit (HOSPITAL_COMMUNITY)
Admission: RE | Admit: 2019-03-06 | Discharge: 2019-03-06 | Disposition: A | Discharge status: Home / Self Care | Payer: Medicare Other | Source: Ambulatory Visit | Attending: Urology | Admitting: Urology

## 2019-03-06 ENCOUNTER — Encounter (HOSPITAL_COMMUNITY): Payer: Self-pay

## 2019-03-06 DIAGNOSIS — Z1159 Encounter for screening for other viral diseases: Secondary | ICD-10-CM | POA: Insufficient documentation

## 2019-03-06 DIAGNOSIS — Z01812 Encounter for preprocedural laboratory examination: Secondary | ICD-10-CM | POA: Diagnosis not present

## 2019-03-06 DIAGNOSIS — R339 Retention of urine, unspecified: Secondary | ICD-10-CM | POA: Insufficient documentation

## 2019-03-06 LAB — BASIC METABOLIC PANEL
Anion gap: 5 (ref 5–15)
BUN: 21 mg/dL (ref 8–23)
CO2: 26 mmol/L (ref 22–32)
Calcium: 8.9 mg/dL (ref 8.9–10.3)
Chloride: 107 mmol/L (ref 98–111)
Creatinine, Ser: 1.5 mg/dL — ABNORMAL HIGH (ref 0.61–1.24)
GFR calc Af Amer: 49 mL/min — ABNORMAL LOW (ref >60)
GFR calc non Af Amer: 42 mL/min — ABNORMAL LOW (ref >60)
Glucose, Bld: 100 mg/dL — ABNORMAL HIGH (ref 70–99)
Potassium: 4.3 mmol/L (ref 3.5–5.1)
Sodium: 138 mmol/L (ref 135–145)

## 2019-03-06 LAB — CBC
HCT: 40.6 % (ref 39.0–52.0)
Hemoglobin: 12.4 g/dL — ABNORMAL LOW (ref 13.0–17.0)
MCH: 29.6 pg (ref 26.0–34.0)
MCHC: 30.5 g/dL (ref 30.0–36.0)
MCV: 96.9 fL (ref 80.0–100.0)
Platelets: 194 10*3/uL (ref 150–400)
RBC: 4.19 MIL/uL — ABNORMAL LOW (ref 4.22–5.81)
RDW: 13.5 % (ref 11.5–15.5)
WBC: 8.4 10*3/uL (ref 4.0–10.5)
nRBC: 0 % (ref 0.0–0.2)

## 2019-03-06 LAB — PROTIME-INR
INR: 1 (ref 0.8–1.2)
Prothrombin Time: 13 seconds (ref 11.4–15.2)

## 2019-03-06 LAB — ABO/RH: ABO/RH(D): O POS

## 2019-03-06 LAB — SARS CORONAVIRUS 2 (TAT 6-24 HRS): SARS Coronavirus 2: NEGATIVE

## 2019-03-07 NOTE — Telephone Encounter (Signed)
See previous note by Gilberto Better and Megan Supple. Based on routing history, the clearance letter was forwarded to [facility] on 6/16. Will forward again and inform patient the need to hold eliquis for 24 hours.

## 2019-03-07 NOTE — Anesthesia Preprocedure Evaluation (Addendum)
Anesthesia Evaluation  Patient identified by MRN, date of birth, ID band Patient awake    Reviewed: Allergy & Precautions, NPO status , Patient's Chart, lab work & pertinent test results  Airway Mallampati: II  TM Distance: >3 FB Neck ROM: Full    Dental  (+) Upper Dentures, Lower Dentures   Pulmonary shortness of breath, former smoker,    Pulmonary exam normal breath sounds clear to auscultation       Cardiovascular Exercise Tolerance: Good hypertension, Pt. on medications and Pt. on home beta blockers Normal cardiovascular exam+ pacemaker  Rhythm:Regular Rate:Normal  ECHO 10/19 50-55%   Neuro/Psych TIAnegative psych ROS   GI/Hepatic GERD  ,  Endo/Other    Renal/GU Renal disease     Musculoskeletal   Abdominal   Peds  Hematology   Anesthesia Other Findings On eliquis   Reproductive/Obstetrics                           Lab Results  Component Value Date   CREATININE 1.50 (H) 03/06/2019   BUN 21 03/06/2019   NA 138 03/06/2019   K 4.3 03/06/2019   CL 107 03/06/2019   CO2 26 03/06/2019    Lab Results  Component Value Date   WBC 8.4 03/06/2019   HGB 12.4 (L) 03/06/2019   HCT 40.6 03/06/2019   MCV 96.9 03/06/2019   PLT 194 03/06/2019    Anesthesia Physical Anesthesia Plan  ASA: III  Anesthesia Plan: General   Post-op Pain Management:    Induction: Intravenous  PONV Risk Score and Plan: Treatment may vary due to age or medical condition and Ondansetron  Airway Management Planned: LMA  Additional Equipment:   Intra-op Plan:   Post-operative Plan:   Informed Consent: I have reviewed the patients History and Physical, chart, labs and discussed the procedure including the risks, benefits and alternatives for the proposed anesthesia with the patient or authorized representative who has indicated his/her understanding and acceptance.     Dental advisory given  Plan  Discussed with:   Anesthesia Plan Comments: (See PAT note 03/06/2019, Konrad Felix, PA-C)      Anesthesia Quick Evaluation

## 2019-03-07 NOTE — Progress Notes (Signed)
Anesthesia Chart Review   Case: [id number] Date/Time: 03/10/19 1327   Procedure: TRANSURETHRAL RESECTION OF THE PROSTATE (TURP) (N/A )   Anesthesia type: General   Pre-op diagnosis: URINARY RETENTION   [facility] ROOM 01 / [facility]   Surgeon: Lucas Mallow, MD      DISCUSSION:85 y.o. former smoker (15 pack years, quit 09/11/78) with h/o HTN, GERD, A-fib (on Eliquis), pacemaker in place, TIA, chronic dyspnea with exertion, BPH, urinary retention scheduled for above procedure 03/10/2019 with Dr. Link Snuffer.   Per Pharmacist, Fuller Canada, RPH, "Recommend only holding Eliquis for 24 hours prior to procedure due to history of TIA.  IF > 24 hour hold is required, will need MD input."  Cleared by cardiology.  Per Leanor Kail, PA-C, "Given past medical history and time since last visit, based on ACC/AHA guidelines, DAIQUAN RESNIK would be at acceptable risk for the planned procedure without further cardiovascular testing. Easily getting > 4 mets of activity."  Anticipate pt can proceed with planned procedure barring acute status change.   VS: BP (!) 146/80   Pulse 97   Temp 36.5 C (Oral)   Resp 16   Ht 5\' 10"  (1.778 m)   Wt 100.7 kg   SpO2 98%   BMI 31.85 kg/m   PROVIDERS: Seward Carol, MD is PCP   [name], MD is Cardiologist  LABS: Labs reviewed: Acceptable for surgery. (all labs ordered are listed, but only abnormal results are displayed)  Labs Reviewed  BASIC METABOLIC PANEL - Abnormal; Notable for the following components:      Result Value   Glucose, Bld 100 (*)    Creatinine, Ser 1.50 (*)    GFR calc non Af Amer 42 (*)    GFR calc Af Amer 49 (*)    All other components within normal limits  CBC - Abnormal; Notable for the following components:   RBC 4.19 (*)    Hemoglobin 12.4 (*)    All other components within normal limits  PROTIME-INR  TYPE AND SCREEN  ABO/RH     IMAGES:   EKG: 07/04/18 Rate 99 bpm Atrial fibrillation  Left posterior  fascicular block   CV: Echo 07/02/18 Study Conclusions  - Left ventricle: The cavity size was normal. There was moderate   concentric hypertrophy. Systolic function was normal. The   estimated ejection fraction was in the range of 55% to 60%. Wall   motion was normal; there were no regional wall motion   abnormalities. - Aortic valve: There was trivial regurgitation. - Mitral valve: Mildly calcified annulus. There was mild   regurgitation. - Left atrium: The atrium was moderately dilated. Past Medical History:  Diagnosis Date  . A-fib ([place])   . Barrett's esophagus   . Dizzy resolved  . Dyspnea    with exertion occ  . GERD (gastroesophageal reflux disease)   . Hard of hearing    wears hearing aids bilat  . High cholesterol   . Hypertension   . Presence of permanent cardiac pacemaker 12/17/2017  . Prostate cancer ([place]) 2002   S/P "radiation for 8-9 weeks"  . SOB (shortness of breath)     Past Surgical History:  Procedure Laterality Date  . BALLOON DILATION N/A 09/05/2018   Procedure: BALLOON DILATION;  Surgeon: Milus Banister, MD;  Location: [facility];  Service: Endoscopy;  Laterality: N/A;  . BILIARY STENT PLACEMENT  07/09/2018   Procedure: BILIARY STENT PLACEMENT;  Surgeon: Milus Banister, MD;  Location:  [facility];  Service: Endoscopy;;  . BILIARY STENT PLACEMENT N/A 09/05/2018   Procedure: BILIARY STENT PLACEMENT;  Surgeon: Milus Banister, MD;  [facility];  Service: Endoscopy;  Laterality: N/A;  . BIOPSY  07/05/2018   Procedure: BIOPSY;  Surgeon: Rush Landmark Telford Nab., MD;  Location: [facility] ENDOSCOPY;  Service: Gastroenterology;;  . CHOLECYSTECTOMY     2020 late april or early may  . ENDOSCOPIC RETROGRADE CHOLANGIOPANCREATOGRAPHY (ERCP) WITH PROPOFOL N/A 09/05/2018   Procedure: ENDOSCOPIC RETROGRADE CHOLANGIOPANCREATOGRAPHY (ERCP) WITH PROPOFOL;  Surgeon: Milus Banister, MD;  [facility];  Service: Endoscopy;  Laterality: N/A;  .  ERCP N/A 07/09/2018   Procedure: ENDOSCOPIC RETROGRADE CHOLANGIOPANCREATOGRAPHY (ERCP);  Surgeon: Milus Banister, MD;  Location: [facility] ENDOSCOPY;  Service: Endoscopy;  Laterality: N/A;  . ESOPHAGOGASTRODUODENOSCOPY (EGD) WITH PROPOFOL N/A 07/05/2018   Procedure: ESOPHAGOGASTRODUODENOSCOPY (EGD) WITH PROPOFOL;  Surgeon: Rush Landmark Telford Nab., MD;  Location: [place];  Service: Gastroenterology;  Laterality: N/A;  . EUS  07/05/2018   Procedure: FULL UPPER ENDOSCOPIC ULTRASOUND (EUS) RADIAL;  Surgeon: Rush Landmark Telford Nab., MD;  Location: [place];  Service: Gastroenterology;;  . INGUINAL HERNIA REPAIR Bilateral   . INSERT / REPLACE / REMOVE PACEMAKER  12/17/2017  . IR CHOLANGIOGRAM EXISTING TUBE  09/10/2018  . IR EXCHANGE BILIARY DRAIN  08/14/2018  . IR EXCHANGE BILIARY DRAIN  10/08/2018  . IR PERC CHOLECYSTOSTOMY  07/10/2018  . PACEMAKER IMPLANT N/A 12/17/2017   Procedure: PACEMAKER IMPLANT;  Surgeon: Evans Lance, MD;  Location: [place] CV LAB;  Service: Cardiovascular;  Laterality: N/A;  . PROSTATE BIOPSY    . REMOVAL OF STONES  07/09/2018   Procedure: REMOVAL OF STONES;  Surgeon: Milus Banister, MD;  Location: [facility]COPY;  Service: Endoscopy;;  . [name]  07/09/2018   Procedure: [name];  Surgeon: Milus Banister, MD;  Location: [facility] ENDOSCOPY;  Service: Endoscopy;;  . [name]  09/05/2018   Procedure: [name];  Surgeon: Milus Banister, MD;  [facility];  Service: Endoscopy;;  . Lavell Islam REMOVAL  09/05/2018   Procedure: STENT REMOVAL;  Surgeon: Milus Banister, MD;  [facility];  Service: Endoscopy;;  . STONE EXTRACTION WITH BASKET  09/05/2018   Procedure: STONE EXTRACTION WITH BASKET;  Surgeon: Milus Banister, MD;  [facility];  Service: Endoscopy;;  . THORACIC AORTIC ANEURYSM REPAIR  03/2018    MEDICATIONS: . Cholecalciferol (VITAMIN D3) 2000 units TABS  . diltiazem (CARDIZEM CD) 180 MG 24 hr capsule  .  ELIQUIS 2.5 MG TABS tablet  . furosemide (LASIX) 40 MG tablet  . metoprolol succinate (TOPROL-XL) 25 MG 24 hr tablet  . pantoprazole (PROTONIX) 40 MG tablet  . tamsulosin (FLOMAX) 0.4 MG CAPS capsule   No current facility-administered medications for this encounter.     Maia Plan [facility] Pre-Surgical Testing [phone] 03/07/19  11:35 AM

## 2019-03-10 ENCOUNTER — Ambulatory Visit (HOSPITAL_COMMUNITY): Payer: Medicare Other | Admitting: Physician Assistant

## 2019-03-10 ENCOUNTER — Encounter (HOSPITAL_COMMUNITY): Payer: Self-pay

## 2019-03-10 ENCOUNTER — Other Ambulatory Visit: Payer: Self-pay

## 2019-03-10 ENCOUNTER — Telehealth (HOSPITAL_COMMUNITY): Payer: Self-pay | Admitting: *Deleted

## 2019-03-10 ENCOUNTER — Encounter (HOSPITAL_COMMUNITY)
Admission: RE | Disposition: A | Discharge status: Home / Self Care | Payer: Self-pay | Source: Ambulatory Visit | Attending: Urology

## 2019-03-10 ENCOUNTER — Ambulatory Visit (HOSPITAL_COMMUNITY): Payer: Medicare Other | Admitting: Anesthesiology

## 2019-03-10 ENCOUNTER — Observation Stay (HOSPITAL_COMMUNITY)
Admission: RE | Admit: 2019-03-10 | Discharge: 2019-03-11 | Disposition: A | Discharge status: Home / Self Care | Payer: Medicare Other | Source: Ambulatory Visit | Attending: Urology | Admitting: Urology

## 2019-03-10 DIAGNOSIS — I509 Heart failure, unspecified: Secondary | ICD-10-CM | POA: Diagnosis not present

## 2019-03-10 DIAGNOSIS — Z7901 Long term (current) use of anticoagulants: Secondary | ICD-10-CM | POA: Diagnosis not present

## 2019-03-10 DIAGNOSIS — N320 Bladder-neck obstruction: Secondary | ICD-10-CM | POA: Diagnosis not present

## 2019-03-10 DIAGNOSIS — R338 Other retention of urine: Secondary | ICD-10-CM | POA: Diagnosis not present

## 2019-03-10 DIAGNOSIS — Z880 Allergy status to penicillin: Secondary | ICD-10-CM | POA: Insufficient documentation

## 2019-03-10 DIAGNOSIS — Z7982 Long term (current) use of aspirin: Secondary | ICD-10-CM | POA: Insufficient documentation

## 2019-03-10 DIAGNOSIS — Z95828 Presence of other vascular implants and grafts: Secondary | ICD-10-CM | POA: Diagnosis not present

## 2019-03-10 DIAGNOSIS — Z79899 Other long term (current) drug therapy: Secondary | ICD-10-CM | POA: Diagnosis not present

## 2019-03-10 DIAGNOSIS — N138 Other obstructive and reflux uropathy: Secondary | ICD-10-CM | POA: Insufficient documentation

## 2019-03-10 DIAGNOSIS — I110 Hypertensive heart disease with heart failure: Secondary | ICD-10-CM | POA: Insufficient documentation

## 2019-03-10 DIAGNOSIS — Z923 Personal history of irradiation: Secondary | ICD-10-CM | POA: Insufficient documentation

## 2019-03-10 DIAGNOSIS — I480 Paroxysmal atrial fibrillation: Secondary | ICD-10-CM | POA: Diagnosis not present

## 2019-03-10 DIAGNOSIS — R339 Retention of urine, unspecified: Secondary | ICD-10-CM | POA: Diagnosis present

## 2019-03-10 DIAGNOSIS — Z87891 Personal history of nicotine dependence: Secondary | ICD-10-CM | POA: Diagnosis not present

## 2019-03-10 DIAGNOSIS — N401 Enlarged prostate with lower urinary tract symptoms: Principal | ICD-10-CM | POA: Insufficient documentation

## 2019-03-10 DIAGNOSIS — Z8673 Personal history of transient ischemic attack (TIA), and cerebral infarction without residual deficits: Secondary | ICD-10-CM | POA: Insufficient documentation

## 2019-03-10 DIAGNOSIS — Z950 Presence of cardiac pacemaker: Secondary | ICD-10-CM | POA: Diagnosis not present

## 2019-03-10 DIAGNOSIS — Z8546 Personal history of malignant neoplasm of prostate: Secondary | ICD-10-CM | POA: Diagnosis not present

## 2019-03-10 DIAGNOSIS — I4891 Unspecified atrial fibrillation: Secondary | ICD-10-CM | POA: Diagnosis not present

## 2019-03-10 HISTORY — PX: TRANSURETHRAL RESECTION OF PROSTATE: SHX73

## 2019-03-10 LAB — TYPE AND SCREEN
ABO/RH(D): O POS
Antibody Screen: NEGATIVE

## 2019-03-10 SURGERY — TURP (TRANSURETHRAL RESECTION OF PROSTATE)
Anesthesia: General

## 2019-03-10 MED ORDER — BELLADONNA ALKALOIDS-OPIUM 16.2-60 MG RE SUPP: 1.0000 | Freq: Four times a day (QID) | RECTAL | Status: DC | PRN

## 2019-03-10 MED ORDER — LIDOCAINE 2% (20 MG/ML) 5 ML SYRINGE
INTRAMUSCULAR | Status: DC | PRN
  Administered 2019-03-10: 100 mg via INTRAVENOUS

## 2019-03-10 MED ORDER — ONDANSETRON HCL 4 MG/2ML IJ SOLN: 4.0000 mg | Freq: Once | INTRAMUSCULAR | Status: DC | PRN

## 2019-03-10 MED ORDER — ACETAMINOPHEN 10 MG/ML IV SOLN: 1000.0000 mg | Freq: Once | INTRAVENOUS | Status: DC | PRN

## 2019-03-10 MED ORDER — PROPOFOL 10 MG/ML IV BOLUS
INTRAVENOUS | Status: DC | PRN
  Administered 2019-03-10: 120 mg via INTRAVENOUS

## 2019-03-10 MED ORDER — SODIUM CHLORIDE 0.9 % IV SOLN
INTRAVENOUS | Status: DC
  Administered 2019-03-10: 15:00:00 via INTRAVENOUS

## 2019-03-10 MED ORDER — DILTIAZEM HCL ER COATED BEADS 180 MG PO CP24
180.0000 mg | ORAL_CAPSULE | Freq: Every day | ORAL | Status: DC
  Administered 2019-03-11: 180 mg via ORAL
  Filled 2019-03-10: qty 1

## 2019-03-10 MED ORDER — DIPHENHYDRAMINE HCL 50 MG/ML IJ SOLN: 12.5000 mg | Freq: Four times a day (QID) | INTRAMUSCULAR | Status: DC | PRN

## 2019-03-10 MED ORDER — TAMSULOSIN HCL 0.4 MG PO CAPS
0.4000 mg | ORAL_CAPSULE | Freq: Two times a day (BID) | ORAL | Status: DC
  Administered 2019-03-10 – 2019-03-11 (×2): 0.4 mg via ORAL
  Filled 2019-03-10 (×2): qty 1

## 2019-03-10 MED ORDER — PANTOPRAZOLE SODIUM 40 MG PO TBEC
40.0000 mg | DELAYED_RELEASE_TABLET | Freq: Two times a day (BID) | ORAL | Status: DC
  Administered 2019-03-10 – 2019-03-11 (×2): 40 mg via ORAL
  Filled 2019-03-10 (×2): qty 1

## 2019-03-10 MED ORDER — FENTANYL CITRATE (PF) 100 MCG/2ML IJ SOLN
INTRAMUSCULAR | Status: AC
  Filled 2019-03-10: qty 2

## 2019-03-10 MED ORDER — SODIUM CHLORIDE 0.9 % IR SOLN
Status: DC | PRN
  Administered 2019-03-10: 9000 mL

## 2019-03-10 MED ORDER — ACETAMINOPHEN 325 MG PO TABS: 650.0000 mg | ORAL_TABLET | ORAL | Status: DC | PRN

## 2019-03-10 MED ORDER — OXYBUTYNIN CHLORIDE 5 MG PO TABS: 5.0000 mg | ORAL_TABLET | Freq: Three times a day (TID) | ORAL | Status: DC | PRN

## 2019-03-10 MED ORDER — FENTANYL CITRATE (PF) 100 MCG/2ML IJ SOLN
INTRAMUSCULAR | Status: DC | PRN
  Administered 2019-03-10 (×4): 25 ug via INTRAVENOUS

## 2019-03-10 MED ORDER — MORPHINE SULFATE (PF) 2 MG/ML IV SOLN: 2.0000 mg | INTRAVENOUS | Status: DC | PRN

## 2019-03-10 MED ORDER — ONDANSETRON HCL 4 MG/2ML IJ SOLN
INTRAMUSCULAR | Status: AC
  Filled 2019-03-10: qty 2

## 2019-03-10 MED ORDER — CIPROFLOXACIN IN D5W 400 MG/200ML IV SOLN
400.0000 mg | INTRAVENOUS | Status: AC
  Administered 2019-03-10: 400 mg via INTRAVENOUS
  Filled 2019-03-10: qty 200

## 2019-03-10 MED ORDER — SODIUM CHLORIDE 0.9 % IR SOLN: 3000.0000 mL | Status: DC

## 2019-03-10 MED ORDER — FENTANYL CITRATE (PF) 100 MCG/2ML IJ SOLN: 25.0000 ug | INTRAMUSCULAR | Status: DC | PRN

## 2019-03-10 MED ORDER — LIDOCAINE 2% (20 MG/ML) 5 ML SYRINGE
INTRAMUSCULAR | Status: AC
  Filled 2019-03-10: qty 5

## 2019-03-10 MED ORDER — BACITRACIN-NEOMYCIN-POLYMYXIN 400-5-5000 EX OINT: 1.0000 "application " | TOPICAL_OINTMENT | Freq: Three times a day (TID) | CUTANEOUS | Status: DC | PRN

## 2019-03-10 MED ORDER — FUROSEMIDE 40 MG PO TABS
40.0000 mg | ORAL_TABLET | Freq: Every day | ORAL | Status: DC
  Administered 2019-03-10 – 2019-03-11 (×2): 40 mg via ORAL
  Filled 2019-03-10 (×2): qty 1

## 2019-03-10 MED ORDER — DIPHENHYDRAMINE HCL 12.5 MG/5ML PO ELIX: 12.5000 mg | ORAL_SOLUTION | Freq: Four times a day (QID) | ORAL | Status: DC | PRN

## 2019-03-10 MED ORDER — METOPROLOL SUCCINATE ER 25 MG PO TB24
25.0000 mg | ORAL_TABLET | Freq: Two times a day (BID) | ORAL | Status: DC
  Administered 2019-03-10 – 2019-03-11 (×2): 25 mg via ORAL
  Filled 2019-03-10 (×2): qty 1

## 2019-03-10 MED ORDER — PROPOFOL 10 MG/ML IV BOLUS
INTRAVENOUS | Status: AC
  Filled 2019-03-10: qty 20

## 2019-03-10 MED ORDER — DEXAMETHASONE SODIUM PHOSPHATE 10 MG/ML IJ SOLN
INTRAMUSCULAR | Status: DC | PRN
  Administered 2019-03-10: 5 mg via INTRAVENOUS

## 2019-03-10 MED ORDER — SENNOSIDES-DOCUSATE SODIUM 8.6-50 MG PO TABS
2.0000 | ORAL_TABLET | Freq: Every day | ORAL | Status: DC
  Administered 2019-03-10: 2 via ORAL
  Filled 2019-03-10: qty 2

## 2019-03-10 MED ORDER — ONDANSETRON HCL 4 MG/2ML IJ SOLN
INTRAMUSCULAR | Status: DC | PRN
  Administered 2019-03-10: 4 mg via INTRAVENOUS

## 2019-03-10 MED ORDER — ONDANSETRON HCL 4 MG/2ML IJ SOLN: 4.0000 mg | INTRAMUSCULAR | Status: DC | PRN

## 2019-03-10 MED ORDER — OXYCODONE HCL 5 MG PO TABS: 5.0000 mg | ORAL_TABLET | ORAL | Status: DC | PRN

## 2019-03-10 MED ORDER — VITAMIN D 25 MCG (1000 UNIT) PO TABS
2000.0000 [IU] | ORAL_TABLET | Freq: Every day | ORAL | Status: DC
  Administered 2019-03-10 – 2019-03-11 (×2): 2000 [IU] via ORAL
  Filled 2019-03-10 (×2): qty 2

## 2019-03-10 MED ORDER — DEXAMETHASONE SODIUM PHOSPHATE 10 MG/ML IJ SOLN
INTRAMUSCULAR | Status: AC
  Filled 2019-03-10: qty 1

## 2019-03-10 MED ORDER — ZOLPIDEM TARTRATE 5 MG PO TABS: 5.0000 mg | ORAL_TABLET | Freq: Every evening | ORAL | Status: DC | PRN

## 2019-03-10 MED ORDER — LACTATED RINGERS IV SOLN
INTRAVENOUS | Status: DC
  Administered 2019-03-10: 11:00:00 via INTRAVENOUS

## 2019-03-10 SURGICAL SUPPLY — 19 items
BAG URINE DRAINAGE (UROLOGICAL SUPPLIES) ×2 IMPLANT
BAG URO CATCHER STRL LF (MISCELLANEOUS) ×2 IMPLANT
CATH FOLEY 3WAY 30CC 24FR (CATHETERS) ×1
CATH URTH STD 24FR FL 3W 2 (CATHETERS) ×1 IMPLANT
CLOTH BEACON ORANGE TIMEOUT ST (SAFETY) ×2 IMPLANT
COVER WAND RF STERILE (DRAPES) IMPLANT
ELECT REM PT RETURN 15FT ADLT (MISCELLANEOUS) IMPLANT
GLOVE BIO SURGEON STRL SZ7.5 (GLOVE) ×4 IMPLANT
GOWN STRL REUS W/TWL LRG LVL3 (GOWN DISPOSABLE) ×2 IMPLANT
GOWN STRL REUS W/TWL XL LVL3 (GOWN DISPOSABLE) ×4 IMPLANT
HOLDER FOLEY CATH W/STRAP (MISCELLANEOUS) ×2 IMPLANT
KIT TURNOVER KIT A (KITS) IMPLANT
LOOP CUT BIPOLAR 24F LRG (ELECTROSURGICAL) ×2 IMPLANT
MANIFOLD NEPTUNE II (INSTRUMENTS) ×2 IMPLANT
PACK CYSTO (CUSTOM PROCEDURE TRAY) ×2 IMPLANT
SET ASPIRATION TUBING (TUBING) IMPLANT
SYRINGE IRR TOOMEY STRL 70CC (SYRINGE) ×2 IMPLANT
TUBING CONNECTING 10 (TUBING) ×2 IMPLANT
TUBING UROLOGY SET (TUBING) ×2 IMPLANT

## 2019-03-10 NOTE — Telephone Encounter (Signed)
Pt is still admitted in the hospital. Looks like surgery already completed.

## 2019-03-10 NOTE — Transfer of Care (Signed)
Immediate Anesthesia Transfer of Care Note  Patient: Ernest Parker  Procedure(s) Performed: TRANSURETHRAL RESECTION OF THE PROSTATE (TURP) (N/A )  Patient Location: PACU  Anesthesia Type:General  Level of Consciousness: drowsy and patient cooperative  Airway & Oxygen Therapy: Patient Spontanous Breathing and Patient connected to face mask oxygen  Post-op Assessment: Report given to RN and Post -op Vital signs reviewed and stable  Post vital signs: Reviewed and stable  Last Vitals:  Vitals Value Taken Time  BP 148/71 03/10/19 1233  Temp    Pulse 60 03/10/19 1234  Resp 11 03/10/19 1234  SpO2 95 % 03/10/19 1234  Vitals shown include unvalidated device data.  Last Pain:  Vitals:   03/10/19 1030  TempSrc:   PainSc: 0-No pain         Complications: No apparent anesthesia complications

## 2019-03-10 NOTE — Discharge Instructions (Signed)

## 2019-03-10 NOTE — Anesthesia Postprocedure Evaluation (Signed)
Anesthesia Post Note  Patient: Ernest Parker  Procedure(s) Performed: TRANSURETHRAL RESECTION OF THE PROSTATE (TURP) (N/A )     Patient location during evaluation: PACU Anesthesia Type: General Level of consciousness: awake and alert Pain management: pain level controlled Vital Signs Assessment: post-procedure vital signs reviewed and stable Respiratory status: spontaneous breathing, nonlabored ventilation, respiratory function stable and patient connected to nasal cannula oxygen Cardiovascular status: blood pressure returned to baseline and stable Postop Assessment: no apparent nausea or vomiting Anesthetic complications: no    Last Vitals:  Vitals:   03/10/19 1345 03/10/19 1418  BP: (!) 147/80 (!) 157/76  Pulse: 66 60  Resp: 13 16  Temp:  36.5 C  SpO2: 99% 96%    Last Pain:  Vitals:   03/10/19 1418  TempSrc: Oral  PainSc: 0-No pain                 Barnet Glasgow

## 2019-03-10 NOTE — Anesthesia Procedure Notes (Signed)
Procedure Name: LMA Insertion Date/Time: 03/10/2019 11:31 AM Performed by: Gwyndolyn Saxon, CRNA Pre-anesthesia Checklist: Patient identified, Emergency Drugs available, Suction available and Patient being monitored Patient Re-evaluated:Patient Re-evaluated prior to induction Oxygen Delivery Method: Circle system utilized Preoxygenation: Pre-oxygenation with 100% oxygen Induction Type: IV induction LMA: LMA with gastric port inserted LMA Size: 5.0 Number of attempts: 2 (LMA #4 with gastric port too small and unable to seal ) Airway Equipment and Method: Patient positioned with wedge pillow Placement Confirmation: positive ETCO2 and breath sounds checked- equal and bilateral Tube secured with: Tape Dental Injury: Teeth and Oropharynx as per pre-operative assessment

## 2019-03-10 NOTE — Op Note (Signed)
Preoperative diagnosis: 1. Bladder outlet obstruction secondary to BPH 2. Urinary retention 3. History of prostate cancer status post radiation  Postoperative diagnosis:  1. Bladder outlet obstruction secondary to BPH 2.  Urinary retention 3.  History of prostate cancer status post radiation Procedure:  1. Cystoscopy 2. Transurethral resection of the prostate  Surgeon: Marton Redwood, III. M.D.  Resident: Dr. Adair Laundry, MD, resident  Anesthesia: General  Complications: None  EBL: Minimal  Specimens: 1. Prostate chips  Indication: Ernest Parker is a patient with bladder outlet obstruction secondary to benign prostatic hyperplasia with urinary retention.  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  He does have a history of radiation for prostate cancer.  He understands that transurethral resection of prostate can result in permanent incontinence.  He greatly desired the procedure in an effort to not have a catheter. After reviewing the management options for treatment, he elected to proceed with the above surgical procedure(s). We have discussed the potential benefits and risks of the procedure, side effects of the proposed treatment, the likelihood of the patient achieving the goals of the procedure, and any potential problems that might occur during the procedure or recuperation. Informed consent has been obtained.  Description of procedure:  The patient was taken to the operating room and general anesthesia was induced.  The patient was placed in the dorsal lithotomy position, prepped and draped in the usual sterile fashion, and preoperative antibiotics were administered. A preoperative time-out was performed.   Cystourethroscopy was performed.  The patient's urethra was examined and was normal/ demonstrated bilobar prostatic hypertrophy.   The bladder was then systematically examined in its entirety. There was  no evidence of any bladder tumors, stones, or other mucosal pathology.  The ureteral orifices were identified and marked so as to be avoided during the procedure.  The prostate adenoma was then resected utilizing loop cautery resection with the bipolar cutting loop.  The prostate adenoma from the bladder neck back to the verumontanum was resected beginning at the six o'clock position and then extended to include the right and left lobes of the prostate and anterior prostate. Care was taken not to resect distal to the verumontanum.  Hemostasis was then achieved with the cautery and the bladder was emptied and reinspected with no significant bleeding noted at the end of the procedure.    A 24 French 3 way catheter was then placed into the bladder.  The patient appeared to tolerate the procedure well and without complications.  The patient was able to be awakened and transferred to the recovery unit in satisfactory condition.

## 2019-03-10 NOTE — H&P (Signed)
CC/HPI: Cc: History of prostate cancer, urinary retention  HPI: A 85 year old male recently underwent an aortic stent graft placement for an aneurysm. This was performed about a month ago. Postoperatively, he developed urinary retention and a Foley catheter was placed. Prior to that, he denied significant voiding complaints. He felt like he was voiding fine on his own. He was not taking any medications for his prostate at that time. He has been started on Flomax 0.4 mg daily. He continues with a Foley catheter currently. Apparently, he had a Foley catheter replaced after an elevated residual post voiding trial. The patient also has a history of prostate cancer. He underwent radiation in 2002. He is from [place] and is relocating here.   04/16/18: Here today for TOV. Continues on tamsulosin. Denies SE. catheter draining appropriately without persistent hematuria or painful urgency.   05/09/18: Here today for repeat TOV. His catheter was replaced one day after last OV after pt re-developed urinary retention with around 900cc in the bladder at the time of catheter replacement. He remains on tamsulosin. At time of last catheter placement, a urine c/s was assessed and resulted positive for Klebsiella. Sensitive to doxycycline, he completed a one-week course of antibiotic. He is not having any problems with catheter drainage denying any painful urgency or gross hematuria. He remains afebrile.   05/23/18: Returns today for TOV. He has been maintained on 0.8mg  tamsulosin since last OV. Denies fevers, problems with catheter draining, no hematuria.   06/07/2018  As noted above, the patient has failed multiple voiding trials despite being on maximum strength tamsulosin. He has a history of prostate cancer status post external beam radiation therapy in the past. He is doing okay with the catheter. No hematuria or dysuria.   06/21/2018:  Patient presents today after undergoing urodynamics. This revealed a  hypotonic bladder. He was able to generate a sustained contraction but it was a weak contraction. He was unable to void a significant amount after with a large PVR.   07/16/2018:  Patient was scheduled for photo vaporization of the prostate. Unfortunately, in the meantime, he was admitted to the hospital with sepsis. He has a cholecystostomy tube because he was unable to undergo cholecystectomy. There was also concern for possible myocardial infarction. He therefore has follow-up scheduled with cardiology for reevaluation. He has no other complaints today.   07/30/18: This patient is here for a Foley catheter exchange. He denies recurrence of gross hematuria. He is tolerating the Foley catheter well. He continues with a cholecystostomy tube. He denies fever, chills, nausea, or vomiting.   02/20/2019  Patient has recovered well since his illness. He has since undergone a cholecystectomy that was open. He remains on eliquis. His cardiologist is Dr. Lovena Le. He is apparently coming off the eliquis for a stent procedure on Monday. He is doing okay with the catheter but would greatly desired to have it out.     ALLERGIES: penicillin    MEDICATIONS: Aspirin 81 mg tablet,chewable  Metoprolol Succinate 100 mg tablet, extended release 24 hr  Tamsulosin Hcl 0.4 mg capsule 1 capsule PO BID  Cardizem Cd 180 mg capsule, ext release 24 hr  Eliquis 2.5 mg tablet  Lasix 40 mg tablet  Vitamin D3     GU PSH: Complex cystometrogram, w/ void pressure and urethral pressure profile studies, any technique - 06/13/2018 Complex Uroflow - 06/13/2018 Cystoscopy - 06/21/2018 Emg surf Electrd - 06/13/2018 Inject For cystogram - 06/13/2018 Intrabd voidng Press - 06/13/2018  PSH Notes: Duanne Guess 2019  aortic aneurysm   NON-GU PSH: Hernia Repair    GU PMH: Prostate Cancer - 04/12/2018 Urinary Retention - 04/12/2018 History of prostate cancer    NON-GU PMH: Anxiety Arrhythmia Atrial Fibrillation Congestive  heart failure Depression GERD Hypercholesterolemia Hypertension Transient ischemic attack    FAMILY HISTORY: 1 Daughter - No Family History 2 sons - No Family History Death - Mother, Father   SOCIAL HISTORY: Marital Status: Widowed Preferred Language: English; Ethnicity: Not Hispanic Or Latino; Race: White Current Smoking Status: Patient does not smoke anymore.   Tobacco Use Assessment Completed: Used Tobacco in last 30 days? Drinks 2 drinks per month. Types of alcohol consumed: Beer.  Drinks 2 caffeinated drinks per day.    REVIEW OF SYSTEMS:    GU Review Male:   Patient denies frequent urination, hard to postpone urination, burning/ pain with urination, get up at night to urinate, leakage of urine, stream starts and stops, trouble starting your stream, have to strain to urinate , erection problems, and penile pain.  Gastrointestinal (Upper):   Patient denies nausea, vomiting, and indigestion/ heartburn.  Gastrointestinal (Lower):   Patient denies diarrhea and constipation.  Constitutional:   Patient denies fever, night sweats, weight loss, and fatigue.  Skin:   Patient denies skin rash/ lesion and itching.  Eyes:   Patient denies blurred vision and double vision.  Ears/ Nose/ Throat:   Patient denies sore throat and sinus problems.  Hematologic/Lymphatic:   Patient denies swollen glands and easy bruising.  Cardiovascular:   Patient denies leg swelling and chest pains.  Respiratory:   Patient denies cough and shortness of breath.  Endocrine:   Patient denies excessive thirst.  Musculoskeletal:   Patient denies back pain and joint pain.  Neurological:   Patient denies dizziness and headaches.  Psychologic:   Patient denies depression and anxiety.   VITAL SIGNS:      02/20/2019 01:50 PM  BP 133/83 mmHg  Heart Rate 108 /min  Temperature 96.7 F / 35.9 C   MULTI-SYSTEM PHYSICAL EXAMINATION:    Constitutional: Well-nourished. No physical deformities. Normally developed. Good  grooming.  Respiratory: No labored breathing, no use of accessory muscles.   Cardiovascular: Normal temperature, adequate perfusion of extremities  Skin: No paleness, no jaundice  Neurologic / Psychiatric: Oriented to time, oriented to place, oriented to person. No depression, no anxiety, no agitation.  Gastrointestinal: No mass, no tenderness, no rigidity, non obese abdomen.  Eyes: Normal conjunctivae. Normal eyelids.  Musculoskeletal: Normal gait and station of head and neck.     PAST DATA REVIEWED:  Source Of History:  Patient  Records Review:   Previous Patient Records   04/12/18  PSA  Total PSA 0.11 ng/mL    PROCEDURES:         Simple Foley Indwelling Cath Change - 51702  The patient's indwelling foley tube was carefully removed. A 16 French Foley catheter was inserted into the bladder using sterile technique. The patient was taught routine catheter care. Hand irrigation of the bladder with sterile water was performed. A leg bag was connected.   ASSESSMENT:      ICD-10 Details  1 GU:   Prostate Cancer - C61   2   Urinary Retention - R33.8    PLAN:           Document Letter(s):  Created for Patient: Clinical Summary         Notes:   The patient has failed medical management for his  lower urinary tract symptoms. He would like to proceed with surgical resection. I discussed bipolar transurethral resection of the prostate. I specifically discussed the risks including but not limited to bleeding which could require blood transfusion, infection, and injury to surrounding structures. Also discussed the possibility that the surgery would not improve symptoms though most men have a great improvement in their symptoms. Also discussed the low likelihood but possibility of development of new symptoms such as irritative voiding symptoms or urinary incontinence. Most men will have some degree of urinary urgency and discomfort immediately following the surgery that resolves in a short amount  of time. He understands that most often this is an outpatient procedure but occasionally patients require hospitalization for continuous bladder irrigation in the event of excess bleeding. He also understands the possibility of being sent home with a urethral catheter. The patient expressed understanding and is eager to proceed.   He understands that surgical resection may result in permanent incontinence given his history of radiation.   Cc: Dr. Jori Moll polite     Signed by Link Snuffer, III, M.D. on 02/20/19 at 5:00 PM (EDT

## 2019-03-11 ENCOUNTER — Encounter (HOSPITAL_COMMUNITY): Payer: Self-pay | Admitting: Urology

## 2019-03-11 DIAGNOSIS — N401 Enlarged prostate with lower urinary tract symptoms: Secondary | ICD-10-CM | POA: Diagnosis not present

## 2019-03-11 DIAGNOSIS — I509 Heart failure, unspecified: Secondary | ICD-10-CM | POA: Diagnosis not present

## 2019-03-11 DIAGNOSIS — N138 Other obstructive and reflux uropathy: Secondary | ICD-10-CM | POA: Diagnosis not present

## 2019-03-11 DIAGNOSIS — I110 Hypertensive heart disease with heart failure: Secondary | ICD-10-CM | POA: Diagnosis not present

## 2019-03-11 DIAGNOSIS — I4891 Unspecified atrial fibrillation: Secondary | ICD-10-CM | POA: Diagnosis not present

## 2019-03-11 DIAGNOSIS — Z923 Personal history of irradiation: Secondary | ICD-10-CM | POA: Diagnosis not present

## 2019-03-11 NOTE — Plan of Care (Signed)

## 2019-03-11 NOTE — Progress Notes (Signed)
Ernest Parker to be D/C'd Home per MD order.  Discussed prescriptions and follow up appointments with the patient. Prescriptions given to patient, medication list explained in detail. Pt verbalized understanding.  Allergies as of 03/11/2019      Reactions   Penicillins Other (See Comments)   Patient can't remember reaction was told by mother he had a SERIOUS allergic rxn as a child  DID THE REACTION INVOLVE: Swelling of the face/tongue/throat, SOB, or low BP? Unknown Sudden or severe rash/hives, skin peeling, or the inside of the mouth or nose? Unknown Did it require medical treatment? Unknown When did it last happen?Childhood allergy  If all above answers are "NO", may proceed with cephalosporin use.      Medication List    TAKE these medications   diltiazem 180 MG 24 hr capsule Commonly known as: CARDIZEM CD Take 1 capsule (180 mg total) by mouth daily.   Eliquis 2.5 MG Tabs tablet Generic drug: apixaban TAKE 1 TABLET BY MOUTH TWICE A DAY   furosemide 40 MG tablet Commonly known as: LASIX TAKE 1 TABLET BY MOUTH EVERY DAY   metoprolol succinate 25 MG 24 hr tablet Commonly known as: TOPROL-XL Take 1 tablet (25 mg total) by mouth 2 (two) times daily.   pantoprazole 40 MG tablet Commonly known as: PROTONIX Take 40 mg by mouth 2 (two) times daily.   tamsulosin 0.4 MG Caps capsule Commonly known as: FLOMAX Take 1 capsule (0.4 mg total) by mouth daily after supper. What changed: when to take this   Vitamin D3 50 MCG (2000 UT) Tabs Take 2,000 Units by mouth daily.       Vitals:   03/11/19 0156 03/11/19 0604  BP: 135/73 (!) 150/77  Pulse: 62 65  Resp: 14 16  Temp: 97.9 F (36.6 C) 97.6 F (36.4 C)  SpO2: 93% 96%    Skin clean, dry and intact without evidence of skin break down, no evidence of skin tears noted. IV catheter discontinued intact. Site without signs and symptoms of complications. Dressing and pressure applied. Pt denies pain at this time. No  complaints noted. Patient received education on foley catheter care and use. Patient verbalized understanding due to patient having foley catheter at home before.   An After Visit Summary was printed and given to the patient. Patient escorted via [place], and D/C home via private auto.  [name] S 03/11/2019 8:26 AM

## 2019-03-11 NOTE — Discharge Summary (Signed)
[facility] Discharge Summary  Admit date: 03/10/2019  Discharge date and time: 03/11/19   Discharge to: Home  Discharge Service: Urology  Discharge Attending Physician:  Gloriann Loan  Discharge  Diagnoses: Urinary retention  Secondary Diagnosis: Active Problems:   Urinary retention   OR Procedures: Procedure(s): TRANSURETHRAL RESECTION OF THE PROSTATE (TURP) 03/10/2019   Ancillary Procedures: None   Discharge Day Services: The patient was seen and examined by the Urology team both in the morning and immediately prior to discharge.  Vital signs and laboratory values were stable and within normal limits.  The physical exam was benign and unchanged. Urine clear off CBI  Discharge instructions were explained and all questions answered.  Subjective  No pain. Tolerating diet. Comfortable with catheter care  Objective Patient Vitals for the past 8 hrs:  BP Temp Temp src Pulse Resp SpO2  03/11/19 0604 (!) 150/77 97.6 F (36.4 C) Oral 65 16 96 %  03/11/19 0156 135/73 97.9 F (36.6 C) Oral 62 14 93 %   No intake/output data recorded.  General Appearance:        No acute distress Lungs:                       Normal work of breathing on room air Heart:                                Regular rate and rhythm Abdomen:                         Soft, non-tender, non-distended GU:        24 fr foley. Clear in tubing.  Extremities:                      Warm and well perfused   Hospital Course:  The patient underwent TURP on 03/10/2019.  Intraoperative findings of blanched prostatic tissue consistent with prior radiation. Minimal resection required to create open channel. Minimal bleeding.  The patient tolerated the procedure well, was extubated in the OR, and afterwards was taken to the PACU for routine post-surgical care. When stable the patient was transferred to the floor.   The patient did well postoperatively.  CBI was weaned and turned off by AM of POD 1.   The patient was discharged  home 1 Day Post-Op, at which point was tolerating a regular solid diet,  Had foley to drainage, have adequate pain control with P.O. pain medication, and could ambulate without difficulty. The patient will follow up with [place] for foley removal, already scheduled in urochart.  Condition at Discharge: Improved  Discharge Medications:  Allergies as of 03/11/2019      Reactions   Penicillins Other (See Comments)   Patient can't remember reaction was told by mother he had a SERIOUS allergic rxn as a child  DID THE REACTION INVOLVE: Swelling of the face/tongue/throat, SOB, or low BP? Unknown Sudden or severe rash/hives, skin peeling, or the inside of the mouth or nose? Unknown Did it require medical treatment? Unknown When did it last happen?Childhood allergy  If all above answers are "NO", may proceed with cephalosporin use.      Medication List    TAKE these medications   diltiazem 180 MG 24 hr capsule Commonly known as: CARDIZEM CD Take 1 capsule (180 mg total) by mouth daily.   Eliquis 2.5 MG Tabs tablet  Generic drug: apixaban TAKE 1 TABLET BY MOUTH TWICE A DAY   furosemide 40 MG tablet Commonly known as: LASIX TAKE 1 TABLET BY MOUTH EVERY DAY   metoprolol succinate 25 MG 24 hr tablet Commonly known as: TOPROL-XL Take 1 tablet (25 mg total) by mouth 2 (two) times daily.   pantoprazole 40 MG tablet Commonly known as: PROTONIX Take 40 mg by mouth 2 (two) times daily.   tamsulosin 0.4 MG Caps capsule Commonly known as: FLOMAX Take 1 capsule (0.4 mg total) by mouth daily after supper. What changed: when to take this   Vitamin D3 50 MCG (2000 UT) Tabs Take 2,000 Units by mouth daily.

## 2019-03-27 ENCOUNTER — Encounter: Payer: Medicare Other | Admitting: *Deleted

## 2019-03-28 ENCOUNTER — Telehealth: Payer: Self-pay

## 2019-03-28 NOTE — Telephone Encounter (Signed)
Left message for patient to remind of missed remote transmission.  

## 2019-04-03 ENCOUNTER — Encounter: Payer: Self-pay | Admitting: Cardiology

## 2019-04-03 DIAGNOSIS — R338 Other retention of urine: Secondary | ICD-10-CM | POA: Diagnosis not present

## 2019-04-10 ENCOUNTER — Ambulatory Visit (INDEPENDENT_AMBULATORY_CARE_PROVIDER_SITE_OTHER): Payer: Medicare Other | Admitting: *Deleted

## 2019-04-10 DIAGNOSIS — R338 Other retention of urine: Secondary | ICD-10-CM | POA: Diagnosis not present

## 2019-04-10 DIAGNOSIS — R8271 Bacteriuria: Secondary | ICD-10-CM | POA: Diagnosis not present

## 2019-04-10 DIAGNOSIS — N401 Enlarged prostate with lower urinary tract symptoms: Secondary | ICD-10-CM | POA: Diagnosis not present

## 2019-04-10 DIAGNOSIS — I495 Sick sinus syndrome: Secondary | ICD-10-CM | POA: Diagnosis not present

## 2019-04-11 LAB — CUP PACEART REMOTE DEVICE CHECK
Date Time Interrogation Session: 20200731091434
Implantable Lead Implant Date: 20190408
Implantable Lead Implant Date: 20190408
Implantable Lead Location: 753859
Implantable Lead Location: 753860
Implantable Pulse Generator Implant Date: 20190408
Pulse Gen Model: 2272
Pulse Gen Serial Number: 9004195

## 2019-04-17 ENCOUNTER — Encounter: Payer: Self-pay | Admitting: Cardiology

## 2019-04-17 NOTE — Progress Notes (Signed)
Remote pacemaker transmission.   

## 2019-04-22 DIAGNOSIS — C61 Malignant neoplasm of prostate: Secondary | ICD-10-CM | POA: Diagnosis not present

## 2019-05-02 ENCOUNTER — Other Ambulatory Visit: Payer: Self-pay

## 2019-05-02 ENCOUNTER — Ambulatory Visit (INDEPENDENT_AMBULATORY_CARE_PROVIDER_SITE_OTHER): Payer: Medicare Other | Admitting: Internal Medicine

## 2019-05-02 ENCOUNTER — Encounter: Payer: Self-pay | Admitting: Internal Medicine

## 2019-05-02 VITALS — BP 106/64 | HR 60 | Ht 70.0 in | Wt 225.0 lb

## 2019-05-02 DIAGNOSIS — I495 Sick sinus syndrome: Secondary | ICD-10-CM | POA: Diagnosis not present

## 2019-05-02 DIAGNOSIS — I480 Paroxysmal atrial fibrillation: Secondary | ICD-10-CM | POA: Diagnosis not present

## 2019-05-02 DIAGNOSIS — Z950 Presence of cardiac pacemaker: Secondary | ICD-10-CM | POA: Diagnosis not present

## 2019-05-02 LAB — CUP PACEART INCLINIC DEVICE CHECK
Battery Remaining Longevity: 116 mo
Battery Voltage: 3.01 V
Brady Statistic RA Percent Paced: 80 %
Brady Statistic RV Percent Paced: 2.7 %
Date Time Interrogation Session: 20200821162353
Implantable Lead Implant Date: 20190408
Implantable Lead Implant Date: 20190408
Implantable Lead Location: 753859
Implantable Lead Location: 753860
Implantable Pulse Generator Implant Date: 20190408
Lead Channel Impedance Value: 475 Ohm
Lead Channel Impedance Value: 550 Ohm
Lead Channel Pacing Threshold Amplitude: 0.5 V
Lead Channel Pacing Threshold Amplitude: 1 V
Lead Channel Pacing Threshold Pulse Width: 0.5 ms
Lead Channel Pacing Threshold Pulse Width: 0.5 ms
Lead Channel Sensing Intrinsic Amplitude: 1.9 mV
Lead Channel Sensing Intrinsic Amplitude: 12 mV
Lead Channel Setting Pacing Amplitude: 2 V
Lead Channel Setting Pacing Amplitude: 2.5 V
Lead Channel Setting Pacing Pulse Width: 0.5 ms
Lead Channel Setting Sensing Sensitivity: 2 mV
Pulse Gen Model: 2272
Pulse Gen Serial Number: 9004195

## 2019-05-02 NOTE — Patient Instructions (Signed)
Your physician recommends that you continue on your current medications as directed. Please refer to the Current Medication list given to you today.   Your physician wants you to follow-up in: YEAR WITH DR TAYLOR  You will receive a reminder letter in the mail two months in advance. If you don't receive a letter, please call our office to schedule the follow-up appointment.  

## 2019-05-02 NOTE — Progress Notes (Signed)
HPI Ernest Parker returns today for followup of his PPM, s/p implant. He has HTN and sinus node dysfunction. He had developed some GB problems and underwent surgery several months ago in [place]. He feels well with no chest pain, sob, or syncope. He has some pain in his right calf with exertion which gets better with rest. Allergies  Allergen Reactions  . Penicillins Other (See Comments)    Patient can't remember reaction was told by mother he had a SERIOUS allergic rxn as a child  DID THE REACTION INVOLVE: Swelling of the face/tongue/throat, SOB, or low BP? Unknown Sudden or severe rash/hives, skin peeling, or the inside of the mouth or nose? Unknown Did it require medical treatment? Unknown When did it last happen?Childhood allergy  If all above answers are "NO", may proceed with cephalosporin use.      Current Outpatient Medications  Medication Sig Dispense Refill  . Cholecalciferol (VITAMIN D3) 2000 units TABS Take 2,000 Units by mouth daily.     Marland Kitchen diltiazem (CARDIZEM CD) 180 MG 24 hr capsule Take 1 capsule (180 mg total) by mouth daily. 90 capsule 3  . ELIQUIS 2.5 MG TABS tablet TAKE 1 TABLET BY MOUTH TWICE A DAY 60 tablet 10  . furosemide (LASIX) 40 MG tablet TAKE 1 TABLET BY MOUTH EVERY DAY 90 tablet 3  . metoprolol succinate (TOPROL-XL) 25 MG 24 hr tablet Take 1 tablet (25 mg total) by mouth 2 (two) times daily. 180 tablet 3  . tamsulosin (FLOMAX) 0.4 MG CAPS capsule Take 1 capsule (0.4 mg total) by mouth daily after supper. (Patient taking differently: Take 0.4 mg by mouth 2 (two) times daily. ) 30 capsule 0   No current facility-administered medications for this visit.      Past Medical History:  Diagnosis Date  . A-fib ([place])   . Barrett's esophagus   . Dizzy resolved  . Dyspnea    with exertion occ  . GERD (gastroesophageal reflux disease)   . Hard of hearing    wears hearing aids bilat  . High cholesterol   . Hypertension   . Presence of permanent  cardiac pacemaker 12/17/2017  . Prostate cancer ([place]) 2002   S/P "radiation for 8-9 weeks"  . SOB (shortness of breath)     ROS:   All systems reviewed and negative except as noted in the HPI.   Past Surgical History:  Procedure Laterality Date  . BALLOON DILATION N/A 09/05/2018   Procedure: BALLOON DILATION;  Surgeon: Milus Banister, MD;  Location: [facility];  Service: Endoscopy;  Laterality: N/A;  . BILIARY STENT PLACEMENT  07/09/2018   Procedure: BILIARY STENT PLACEMENT;  Surgeon: Milus Banister, MD;  Location: [facility] ENDOSCOPY;  Service: Endoscopy;;  . BILIARY STENT PLACEMENT N/A 09/05/2018   Procedure: BILIARY STENT PLACEMENT;  Surgeon: Milus Banister, MD;  [facility] ENDOSCOPY;  Service: Endoscopy;  Laterality: N/A;  . BIOPSY  07/05/2018   Procedure: BIOPSY;  Surgeon: Rush Landmark Telford Nab., MD;  Location: [facility] ENDOSCOPY;  Service: Gastroenterology;;  . CHOLECYSTECTOMY     2020 late april or early may  . ENDOSCOPIC RETROGRADE CHOLANGIOPANCREATOGRAPHY (ERCP) WITH PROPOFOL N/A 09/05/2018   Procedure: ENDOSCOPIC RETROGRADE CHOLANGIOPANCREATOGRAPHY (ERCP) WITH PROPOFOL;  Surgeon: Milus Banister, MD;  [facility] ENDOSCOPY;  Service: Endoscopy;  Laterality: N/A;  . ERCP N/A 07/09/2018   Procedure: ENDOSCOPIC RETROGRADE CHOLANGIOPANCREATOGRAPHY (ERCP);  Surgeon: Milus Banister, MD;  Location: [facility] ENDOSCOPY;  Service: Endoscopy;  Laterality: N/A;  .  ESOPHAGOGASTRODUODENOSCOPY (EGD) WITH PROPOFOL N/A 07/05/2018   Procedure: ESOPHAGOGASTRODUODENOSCOPY (EGD) WITH PROPOFOL;  Surgeon: Rush Landmark Telford Nab., MD;  Location: [place];  Service: Gastroenterology;  Laterality: N/A;  . EUS  07/05/2018   Procedure: FULL UPPER ENDOSCOPIC ULTRASOUND (EUS) RADIAL;  Surgeon: Rush Landmark Telford Nab., MD;  Location: [place];  Service: Gastroenterology;;  . INGUINAL HERNIA REPAIR Bilateral   . INSERT / REPLACE / REMOVE PACEMAKER  12/17/2017  . IR CHOLANGIOGRAM EXISTING TUBE   09/10/2018  . IR EXCHANGE BILIARY DRAIN  08/14/2018  . IR EXCHANGE BILIARY DRAIN  10/08/2018  . IR PERC CHOLECYSTOSTOMY  07/10/2018  . PACEMAKER IMPLANT N/A 12/17/2017   Procedure: PACEMAKER IMPLANT;  Surgeon: Evans Lance, MD;  Location: [place] CV LAB;  Service: Cardiovascular;  Laterality: N/A;  . PROSTATE BIOPSY    . REMOVAL OF STONES  07/09/2018   Procedure: REMOVAL OF STONES;  Surgeon: Milus Banister, MD;  Location: [facility] ENDOSCOPY;  Service: Endoscopy;;  . [name]  07/09/2018   Procedure: [name];  Surgeon: Milus Banister, MD;  Location: [facility] ENDOSCOPY;  Service: Endoscopy;;  . [name]  09/05/2018   Procedure: [name];  Surgeon: Milus Banister, MD;  [facility] END[facility];  Service: Endoscopy;;  . Lavell Islam REMOVAL  09/05/2018   Procedure: STENT REMOVAL;  Surgeon: Milus Banister, MD;  [facility] ENDOS[facility];  Service: Endoscopy;;  . STONE EXTRACTION WITH BASKET  09/05/2018   Procedure: STONE EXTRACTION WITH BASKET;  Surgeon: Milus Banister, MD;  [facility] ENDOSCOPY;  Service: Endoscopy;;  . THORACIC AORTIC ANEURYSM REPAIR  03/2018  . TRANSURETHRAL RESECTION OF PROSTATE N/A 03/10/2019   Procedure: TRANSURETHRAL RESECTION OF THE PROSTATE (TURP);  Surgeon: Lucas Mallow, MD;  [facility] ORS;  Service: Urology;  Laterality: N/A;     Family History  Problem Relation Age of Onset  . Hypertension Mother   . Tuberculosis Father   . Kidney disease Sister   . Sleep apnea Sister      Social History   Socioeconomic History  . Marital status: Widowed    Spouse name: Not on file  . Number of children: Not on file  . Years of education: Not on file  . Highest education level: Not on file  Occupational History  . Occupation: RETIRED  Social Needs  . Financial resource strain: Not on file  . Food insecurity    Worry: Not on file    Inability: Not on file  . Transportation needs    Medical: Not on file    Non-medical: Not on file   Tobacco Use  . Smoking status: Former Smoker    Packs/day: 0.50    Years: 30.00    Pack years: 15.00    Types: Cigarettes    Quit date: 1980    Years since quitting: 40.6  . Smokeless tobacco: Never Used  Substance and Sexual Activity  . Alcohol use: Not Currently    Alcohol/week: 1.0 standard drinks    Types: 1 Cans of beer per week  . Drug use: No  . Sexual activity: Not Currently  Lifestyle  . Physical activity    Days per week: Not on file    Minutes per session: Not on file  . Stress: Not on file  Relationships  . Social Herbalist on phone: Not on file    Gets together: Not on file    Attends religious service: Not on file    Active member of club or organization: Not  on file    Attends meetings of clubs or organizations: Not on file    Relationship status: Not on file  . Intimate partner violence    Fear of current or ex partner: Not on file    Emotionally abused: Not on file    Physically abused: Not on file    Forced sexual activity: Not on file  Other Topics Concern  . 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 Lives alone during the summer in [place] and lives with daughter during the Winter in [place], moving to [place]. Has 1 daughter and 2 sons. One sone lives in [place] and one in [place].   Is an avid golfer, plays 4-5 days per week. Likes to do yardwork.      BP 106/64   Pulse 60   Ht 5\' 10"  (1.778 m)   Wt 225 lb (102.1 kg)   SpO2 95%   BMI 32.28 kg/m   Physical Exam:  Well appearing NAD HEENT: Unremarkable Neck:  No JVD, no thyromegally Lymphatics:  No adenopathy Back:  No CVA tenderness Lungs:  Clear HEART:  Regular rate rhythm, no murmurs, no rubs, no clicks Abd:  soft, positive bowel sounds, no organomegally, no rebound, no guarding Ext:  2 plus pulses, no edema, no cyanosis, no clubbing Skin:  No rashes no nodules Neuro:  CN II through XII intact, motor  grossly intact  EKG - nsr with atrial pacing  DEVICE  Normal device function.  See PaceArt for details.   Assess/Plan: 1, sinus node dysfunction - he is asymptomatic, s/p PPM insertion. 2. PPM - his St. Jude DDD PM is working normally. We will recheck in several months. 3. HTN - his bp is well controlled.  4. Aneurysm - he has no abdominal pain. We will follow.  Mikle Bosworth.D

## 2019-05-03 ENCOUNTER — Other Ambulatory Visit: Payer: Self-pay | Admitting: Internal Medicine

## 2019-05-23 ENCOUNTER — Other Ambulatory Visit: Payer: Self-pay | Admitting: Internal Medicine

## 2019-05-29 DIAGNOSIS — Z23 Encounter for immunization: Secondary | ICD-10-CM | POA: Diagnosis not present

## 2019-06-13 MED ORDER — FUROSEMIDE 40 MG PO TABS: 40.0000 mg | ORAL_TABLET | Freq: Every day | ORAL | 3 refills | Status: DC

## 2019-06-24 DIAGNOSIS — N312 Flaccid neuropathic bladder, not elsewhere classified: Secondary | ICD-10-CM | POA: Diagnosis not present

## 2019-07-10 ENCOUNTER — Ambulatory Visit (INDEPENDENT_AMBULATORY_CARE_PROVIDER_SITE_OTHER): Payer: Medicare Other | Admitting: *Deleted

## 2019-07-10 DIAGNOSIS — I480 Paroxysmal atrial fibrillation: Secondary | ICD-10-CM | POA: Diagnosis not present

## 2019-07-10 DIAGNOSIS — I495 Sick sinus syndrome: Secondary | ICD-10-CM

## 2019-07-10 LAB — CUP PACEART REMOTE DEVICE CHECK
Battery Remaining Longevity: 112 mo
Battery Remaining Percentage: 95.5 %
Battery Voltage: 3.01 V
Brady Statistic AP VP Percent: 1 %
Brady Statistic AP VS Percent: 93 %
Brady Statistic AS VP Percent: 1 %
Brady Statistic AS VS Percent: 4.9 %
Brady Statistic RA Percent Paced: 86 %
Brady Statistic RV Percent Paced: 2.9 %
Date Time Interrogation Session: 20201029093043
Implantable Lead Implant Date: 20190408
Implantable Lead Implant Date: 20190408
Implantable Lead Location: 753859
Implantable Lead Location: 753860
Implantable Pulse Generator Implant Date: 20190408
Lead Channel Impedance Value: 460 Ohm
Lead Channel Impedance Value: 550 Ohm
Lead Channel Pacing Threshold Amplitude: 0.5 V
Lead Channel Pacing Threshold Amplitude: 1 V
Lead Channel Pacing Threshold Pulse Width: 0.5 ms
Lead Channel Pacing Threshold Pulse Width: 0.5 ms
Lead Channel Sensing Intrinsic Amplitude: 12 mV
Lead Channel Sensing Intrinsic Amplitude: 2 mV
Lead Channel Setting Pacing Amplitude: 2 V
Lead Channel Setting Pacing Amplitude: 2.5 V
Lead Channel Setting Pacing Pulse Width: 0.5 ms
Lead Channel Setting Sensing Sensitivity: 2 mV
Pulse Gen Model: 2272
Pulse Gen Serial Number: 9004195

## 2019-07-21 DIAGNOSIS — N312 Flaccid neuropathic bladder, not elsewhere classified: Secondary | ICD-10-CM | POA: Diagnosis not present

## 2019-08-01 NOTE — Progress Notes (Signed)
Remote pacemaker transmission.   

## 2019-08-11 ENCOUNTER — Other Ambulatory Visit: Payer: Self-pay | Admitting: *Deleted

## 2019-08-11 MED ORDER — METOPROLOL SUCCINATE ER 25 MG PO TB24: 25.0000 mg | ORAL_TABLET | Freq: Two times a day (BID) | ORAL | 3 refills | Status: DC

## 2019-08-18 DIAGNOSIS — N312 Flaccid neuropathic bladder, not elsewhere classified: Secondary | ICD-10-CM | POA: Diagnosis not present

## 2019-08-19 IMAGING — MR MR 3D RECON AT SCANNER
9 of 13 series · 40 of 48 positions shown · non-contrast
Comparison: Right upper quadrant ultrasound dated 07/01/2018

CLINICAL DATA: Jaundice, abdominal pain, fever

EXAM:
MRI ABDOMEN WITHOUT CONTRAST  (INCLUDING MRCP)
TECHNIQUE: Multiplanar multisequence MR imaging of the abdomen was performed.
Heavily T2-weighted images of the biliary and pancreatic ducts were
obtained, and three-dimensional MRCP images were rendered by post
processing.

[Series 4: ax haste · axial · 6.0mm · 1.50mm/px · z∈[-32,+206]mm · 3 of 34 slices shown]
[im 1/34]
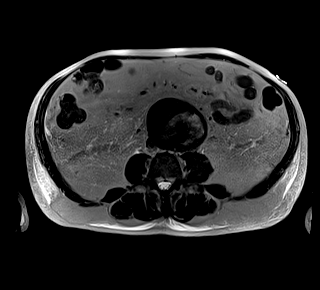
[im 17/34]
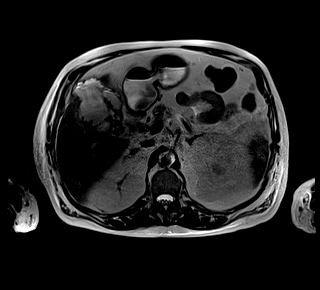
[im 34/34]
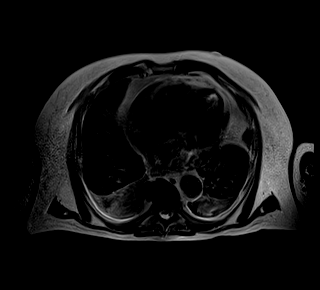

[Series 5: bSSFP · coronal · 6.0mm · 0.94mm/px · 3 of 40 slices shown]
[im 1/40]
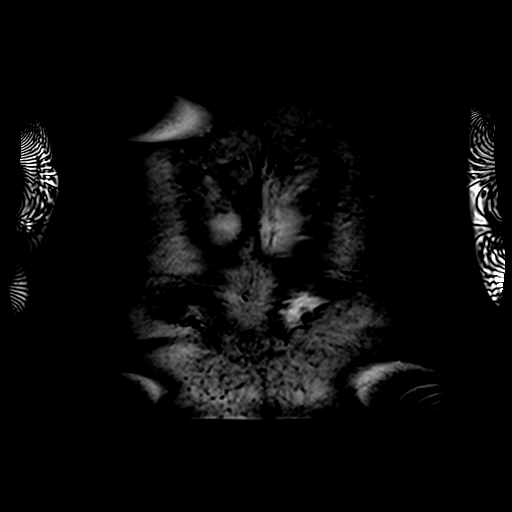
[im 20/40]
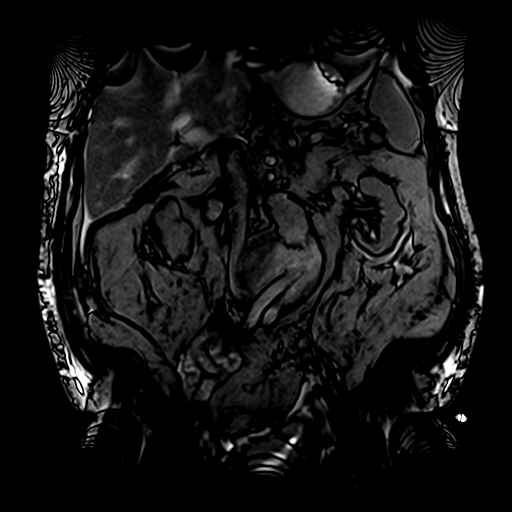
[im 40/40]
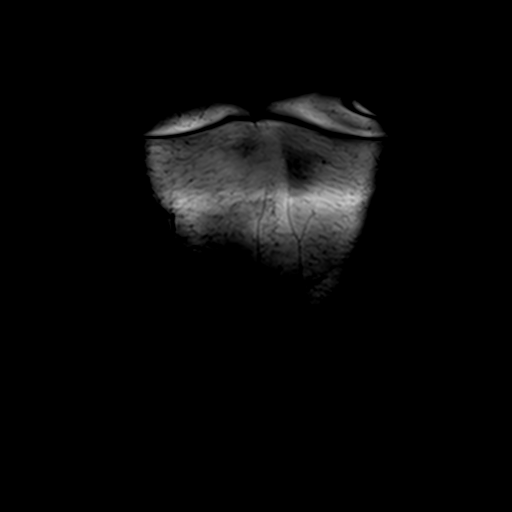

[Series 8: T2 fat-sat · axial · 6.0mm · 1.50mm/px · z∈[-16,+221]mm · 3 of 34 slices shown]
[im 1/34]
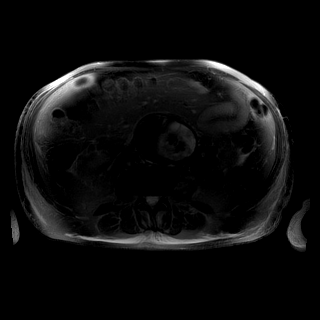
[im 17/34]
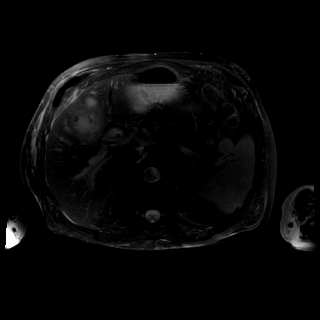
[im 34/34]
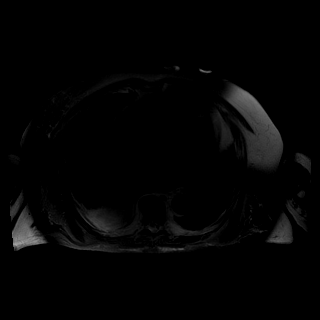

[Series 11: DWI · axial · 6.0mm · 1.79mm/px · z∈[-18,+220]mm · 8 of 102 slices shown (1 of 2)]
[im 1/102]
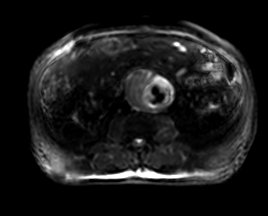
[im 15/102]
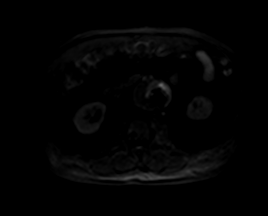
[im 29/102]
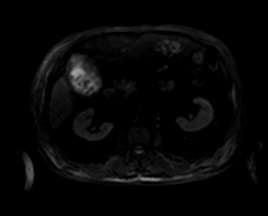
[im 44/102]
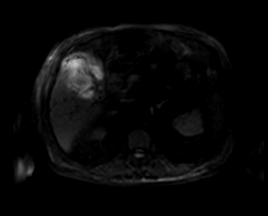
[im 58/102]
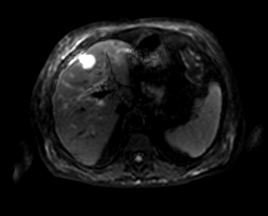
[im 73/102]
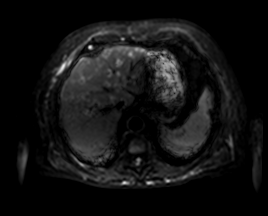
[im 87/102]
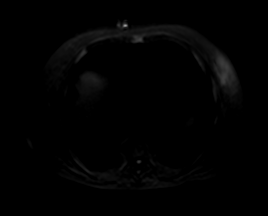
[im 102/102]
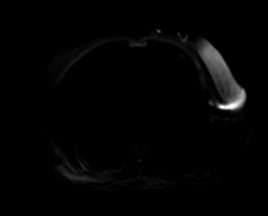

[Series 12: DWI · axial · 6.0mm · 1.79mm/px · z∈[-18,+220]mm · 3 of 34 slices shown (2 of 2)]
[im 1/34]
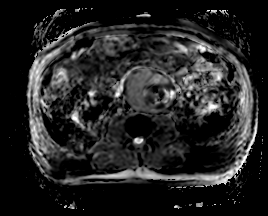
[im 17/34]
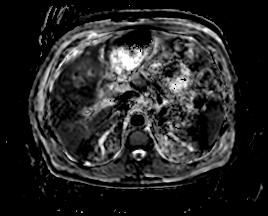
[im 34/34]
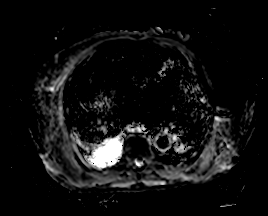

[Series 13: MRCP · coronal · 4.0mm · 1.41mm/px · 1 of 15 slices shown]
[im 1/15]
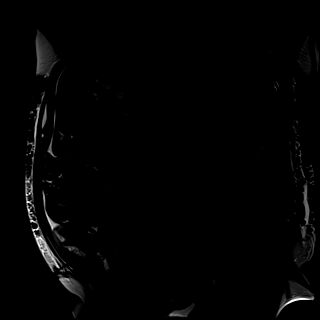

[Series 14: ax in and · axial · 3.0mm · 1.50mm/px · z∈[-15,+222]mm · 12 of 160 slices shown]
[im 1/160]
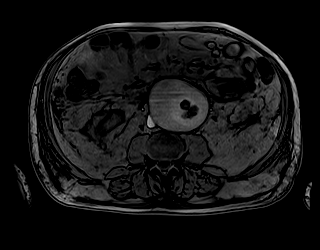
[im 15/160]
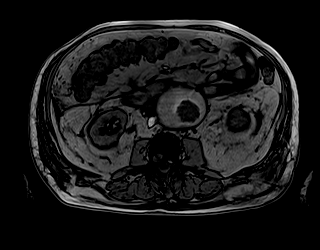
[im 29/160]
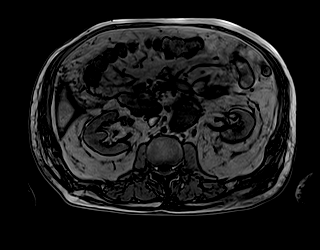
[im 44/160]
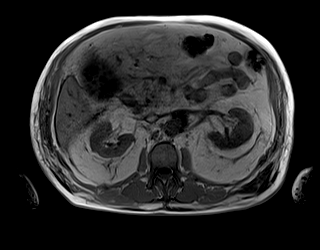
[im 58/160]
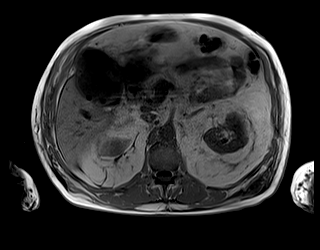
[im 73/160]
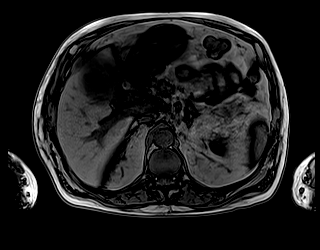
[im 87/160]
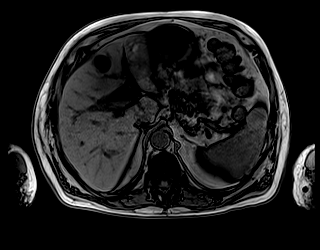
[im 102/160]
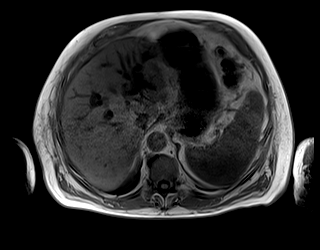
[im 116/160]
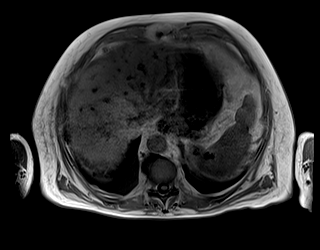
[im 131/160]
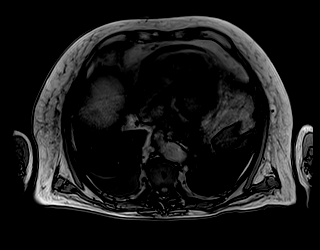
[im 145/160]
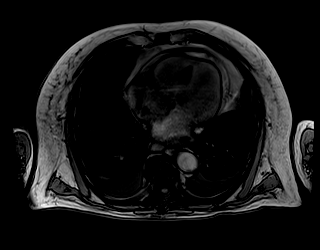
[im 160/160]
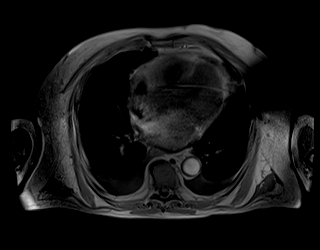

[Series 15: radials · coronal · 50.0mm · 0.78mm/px · 1 of 5 slices shown]
[im 1/5]
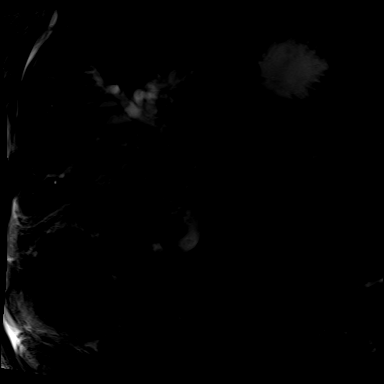

[Series 16: T1 dynamic · axial · non-contrast · 3.0mm · 1.50mm/px · z∈[-14,+223]mm · 6 of 80 slices shown]
[im 1/80]
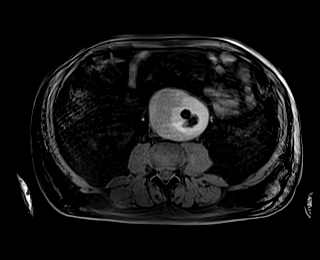
[im 16/80]
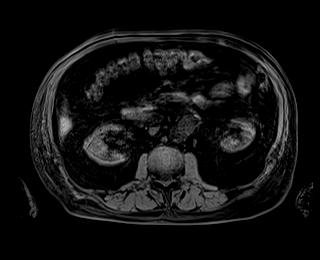
[im 32/80]
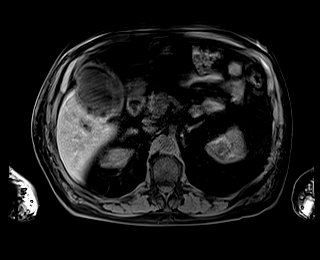
[im 48/80]
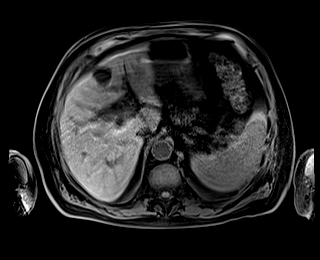
[im 64/80]
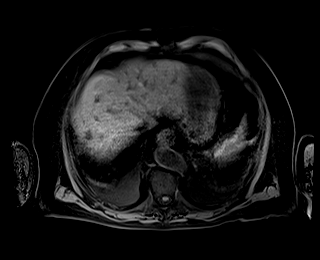
[im 80/80]
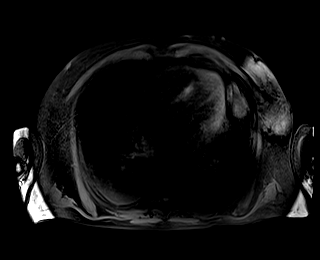

[40 of 48 positions shown; findings below may reference images not displayed]

FINDINGS: Motion degraded images.

Lower chest: Small bilateral pleural effusions.

Hepatobiliary: No morphologic findings of cirrhosis. 2.9 cm cyst in
segment 4B (series 4/image 13).

Mild intrahepatic and extrahepatic ductal dilatation. Multiple
common duct stones, measuring up to 12 mm in the central common duct
(series 13/image 6).

Distended, irregular gallbladder with numerous layering gallstones
and mild gallbladder wall thickening. Given the associated findings
and irregular appearance of the gallbladder wall, this appearance is
worrisome for acute cholecystitis.

Pancreas:  Within normal limits.

Spleen:  Within normal limits.

Adrenals/Urinary Tract:  Adrenal glands are within normal limits.

Kidneys are within normal limits.  No hydronephrosis.

Stomach/Bowel: Stomach is notable for a tiny hiatal hernia.

Duodenal diverticulum (series 4/image 25) along the posterior aspect
of the 2nd/3rd portion of the duodenum.

Visualized bowel is otherwise grossly unremarkable.

Vascular/Lymphatic: 7.6 x 9.6 cm infrarenal abdominal aortic
aneurysm (series 4/image 33), incompletely visualized. Suspected
indwelling aorto bi-iliac stent.

No suspicious abdominal lymphadenopathy.

Other:  No abdominal ascites.

Musculoskeletal: No focal osseous lesions.
IMPRESSION: Distended, irregular gallbladder with cholelithiasis and gallbladder
wall thickening, favoring acute cholecystitis.

Associated intrahepatic and extrahepatic dilatation with multiple
common duct stones measuring up to 12 mm.

7.6 x 9.6 cm infrarenal abdominal aortic aneurysm, incompletely
visualized. Suspected aorto bi-iliac stents.

## 2019-08-22 IMAGING — US IR CHOLECYSTOSTOMY
1 series · 1 of 1 positions shown · non-contrast
Comparison: none

INDICATION: Acute cholecystitis

[Series 1: ir cholecystostomy · 1 of 1 slices shown]
[im 1/1]
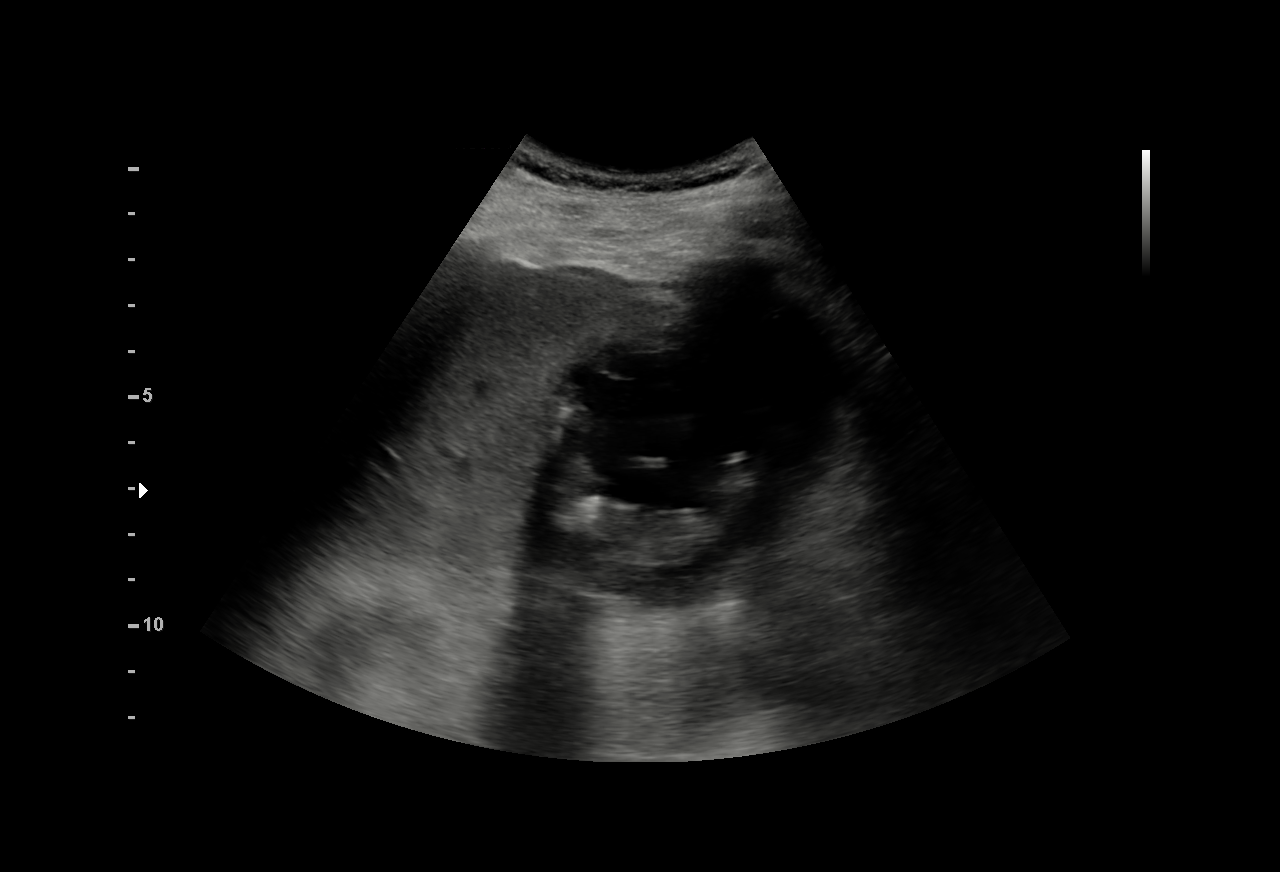

[1 of 1 positions shown; findings below may reference images not displayed]

EXAM:
CHOLECYSTOSTOMY

MEDICATIONS:
The patient is currently on Levaquin

ANESTHESIA/SEDATION:
Fentanyl 50 mcg IV; Versed 1 mg IV

Moderate Sedation Time:  6 minutes

The patient was continuously monitored during the procedure by the
interventional radiology nurse under my direct supervision.

FLUOROSCOPY TIME:  Fluoroscopy Time:  minutes 24 seconds (3 mGy).

COMPLICATIONS:
None immediate.

PROCEDURE:
Informed written consent was obtained from the patient after a
thorough discussion of the procedural risks, benefits and
alternatives. All questions were addressed. Maximal Sterile Barrier
Technique was utilized including caps, mask, sterile gowns, sterile
gloves, sterile drape, hand hygiene and skin antiseptic. A timeout
was performed prior to the initiation of the procedure.

The right upper quadrant was prepped with ChloraPrep in a sterile
fashion, and a sterile drape was applied covering the operative
field. A sterile gown and sterile gloves were used for the
procedure.

A 21 gauge needle was inserted into the gallbladder lumen under
sonographic guidance and via transhepatic approach. It was removed
over a 018 wire, which was upsized to a 3-J. A 10-French drain was
advanced over the wire and coiled in the gallbladder lumen. It was
sewn to the skin after being string fixed. Frank pus was aspirated.
FINDINGS: The cystic duct is occluded.

Final imaging demonstrates a 10 French drain coiled in the lumen of
the gallbladder.
IMPRESSION: Successful cholecystostomy. This needs to remain in place at least 6
weeks.

## 2019-09-08 ENCOUNTER — Other Ambulatory Visit: Payer: Self-pay | Admitting: *Deleted

## 2019-09-08 MED ORDER — APIXABAN 2.5 MG PO TABS: 2.5000 mg | ORAL_TABLET | Freq: Two times a day (BID) | ORAL | 5 refills | Status: DC

## 2019-09-08 NOTE — Telephone Encounter (Signed)
Prescription refill request for Eliquis received.  Last office visit: 05/02/2019 Scr:  1.50, 03/06/2019 Age: 85 y.o. Weight: 102.1kg  Prescription refill sent.

## 2019-09-22 DIAGNOSIS — N1832 Chronic kidney disease, stage 3b: Secondary | ICD-10-CM | POA: Diagnosis not present

## 2019-09-22 DIAGNOSIS — I4891 Unspecified atrial fibrillation: Secondary | ICD-10-CM | POA: Diagnosis not present

## 2019-09-22 DIAGNOSIS — N320 Bladder-neck obstruction: Secondary | ICD-10-CM | POA: Diagnosis not present

## 2019-09-22 DIAGNOSIS — Z23 Encounter for immunization: Secondary | ICD-10-CM | POA: Diagnosis not present

## 2019-09-22 DIAGNOSIS — Z1389 Encounter for screening for other disorder: Secondary | ICD-10-CM | POA: Diagnosis not present

## 2019-09-22 DIAGNOSIS — Z0000 Encounter for general adult medical examination without abnormal findings: Secondary | ICD-10-CM | POA: Diagnosis not present

## 2019-09-22 DIAGNOSIS — R7309 Other abnormal glucose: Secondary | ICD-10-CM | POA: Diagnosis not present

## 2019-09-22 DIAGNOSIS — I10 Essential (primary) hypertension: Secondary | ICD-10-CM | POA: Diagnosis not present

## 2019-09-23 DIAGNOSIS — R338 Other retention of urine: Secondary | ICD-10-CM | POA: Diagnosis not present

## 2019-10-09 ENCOUNTER — Ambulatory Visit (INDEPENDENT_AMBULATORY_CARE_PROVIDER_SITE_OTHER): Payer: Medicare Other | Admitting: *Deleted

## 2019-10-09 DIAGNOSIS — I480 Paroxysmal atrial fibrillation: Secondary | ICD-10-CM

## 2019-10-09 LAB — CUP PACEART REMOTE DEVICE CHECK
Battery Remaining Longevity: 112 mo
Battery Remaining Percentage: 95.5 %
Battery Voltage: 3.01 V
Brady Statistic AP VP Percent: 1 %
Brady Statistic AP VS Percent: 93 %
Brady Statistic AS VP Percent: 1 %
Brady Statistic AS VS Percent: 5.4 %
Brady Statistic RA Percent Paced: 87 %
Brady Statistic RV Percent Paced: 2.6 %
Date Time Interrogation Session: 20210128031317
Implantable Lead Implant Date: 20190408
Implantable Lead Implant Date: 20190408
Implantable Lead Location: 753859
Implantable Lead Location: 753860
Implantable Pulse Generator Implant Date: 20190408
Lead Channel Impedance Value: 440 Ohm
Lead Channel Impedance Value: 560 Ohm
Lead Channel Pacing Threshold Amplitude: 0.5 V
Lead Channel Pacing Threshold Amplitude: 1 V
Lead Channel Pacing Threshold Pulse Width: 0.5 ms
Lead Channel Pacing Threshold Pulse Width: 0.5 ms
Lead Channel Sensing Intrinsic Amplitude: 11.8 mV
Lead Channel Sensing Intrinsic Amplitude: 2.2 mV
Lead Channel Setting Pacing Amplitude: 2 V
Lead Channel Setting Pacing Amplitude: 2.5 V
Lead Channel Setting Pacing Pulse Width: 0.5 ms
Lead Channel Setting Sensing Sensitivity: 2 mV
Pulse Gen Model: 2272
Pulse Gen Serial Number: 9004195

## 2019-10-09 NOTE — Progress Notes (Signed)
PPM Remote  

## 2019-10-22 DIAGNOSIS — N312 Flaccid neuropathic bladder, not elsewhere classified: Secondary | ICD-10-CM | POA: Diagnosis not present

## 2019-11-19 DIAGNOSIS — R338 Other retention of urine: Secondary | ICD-10-CM | POA: Diagnosis not present

## 2019-12-22 DIAGNOSIS — N312 Flaccid neuropathic bladder, not elsewhere classified: Secondary | ICD-10-CM | POA: Diagnosis not present

## 2020-01-08 ENCOUNTER — Ambulatory Visit (INDEPENDENT_AMBULATORY_CARE_PROVIDER_SITE_OTHER): Payer: Medicare Other | Admitting: *Deleted

## 2020-01-08 DIAGNOSIS — I480 Paroxysmal atrial fibrillation: Secondary | ICD-10-CM | POA: Diagnosis not present

## 2020-01-08 LAB — CUP PACEART REMOTE DEVICE CHECK
Battery Remaining Longevity: 112 mo
Battery Remaining Percentage: 95.5 %
Battery Voltage: 3.01 V
Brady Statistic AP VP Percent: 1 %
Brady Statistic AP VS Percent: 92 %
Brady Statistic AS VP Percent: 1 %
Brady Statistic AS VS Percent: 5.7 %
Brady Statistic RA Percent Paced: 86 %
Brady Statistic RV Percent Paced: 2.6 %
Date Time Interrogation Session: 20210429020015
Implantable Lead Implant Date: 20190408
Implantable Lead Implant Date: 20190408
Implantable Lead Location: 753859
Implantable Lead Location: 753860
Implantable Pulse Generator Implant Date: 20190408
Lead Channel Impedance Value: 430 Ohm
Lead Channel Impedance Value: 550 Ohm
Lead Channel Pacing Threshold Amplitude: 0.5 V
Lead Channel Pacing Threshold Amplitude: 1 V
Lead Channel Pacing Threshold Pulse Width: 0.5 ms
Lead Channel Pacing Threshold Pulse Width: 0.5 ms
Lead Channel Sensing Intrinsic Amplitude: 11.5 mV
Lead Channel Sensing Intrinsic Amplitude: 2.1 mV
Lead Channel Setting Pacing Amplitude: 2 V
Lead Channel Setting Pacing Amplitude: 2.5 V
Lead Channel Setting Pacing Pulse Width: 0.5 ms
Lead Channel Setting Sensing Sensitivity: 2 mV
Pulse Gen Model: 2272
Pulse Gen Serial Number: 9004195

## 2020-01-09 NOTE — Progress Notes (Signed)
PPM Remote  

## 2020-01-19 DIAGNOSIS — N312 Flaccid neuropathic bladder, not elsewhere classified: Secondary | ICD-10-CM | POA: Diagnosis not present

## 2020-02-23 DIAGNOSIS — N312 Flaccid neuropathic bladder, not elsewhere classified: Secondary | ICD-10-CM | POA: Diagnosis not present

## 2020-02-27 ENCOUNTER — Other Ambulatory Visit: Payer: Self-pay | Admitting: Internal Medicine

## 2020-02-27 NOTE — Telephone Encounter (Signed)
Pt last saw Dr Lovena Le 05/02/19, last labs 03/06/19 Creat 1.50, age 86, weight 102.1kg, based on specified criteria pt is on appropriate dosage of Eliquis 2.5mg  BID.  Will refill rx.

## 2020-03-16 MED ORDER — APIXABAN 2.5 MG PO TABS: 2.5000 mg | ORAL_TABLET | Freq: Two times a day (BID) | ORAL | 11 refills | Status: DC

## 2020-03-16 NOTE — Telephone Encounter (Signed)
Sent again d/t Pt states [facility] did not refill.

## 2020-03-16 NOTE — Addendum Note (Signed)
Addended by: Willeen Cass A on: 03/16/2020 04:58 PM   Modules accepted: Orders

## 2020-04-08 ENCOUNTER — Ambulatory Visit (INDEPENDENT_AMBULATORY_CARE_PROVIDER_SITE_OTHER): Payer: Medicare Other | Admitting: *Deleted

## 2020-04-08 DIAGNOSIS — I495 Sick sinus syndrome: Secondary | ICD-10-CM | POA: Diagnosis not present

## 2020-04-08 LAB — CUP PACEART REMOTE DEVICE CHECK
Battery Remaining Longevity: 112 mo
Battery Remaining Percentage: 95.5 %
Battery Voltage: 3.01 V
Brady Statistic AP VP Percent: 1.6 %
Brady Statistic AP VS Percent: 91 %
Brady Statistic AS VP Percent: 1 %
Brady Statistic AS VS Percent: 5.9 %
Brady Statistic RA Percent Paced: 86 %
Brady Statistic RV Percent Paced: 3.5 %
Date Time Interrogation Session: 20210729033514
Implantable Lead Implant Date: 20190408
Implantable Lead Implant Date: 20190408
Implantable Lead Location: 753859
Implantable Lead Location: 753860
Implantable Pulse Generator Implant Date: 20190408
Lead Channel Impedance Value: 430 Ohm
Lead Channel Impedance Value: 550 Ohm
Lead Channel Pacing Threshold Amplitude: 0.5 V
Lead Channel Pacing Threshold Amplitude: 1 V
Lead Channel Pacing Threshold Pulse Width: 0.5 ms
Lead Channel Pacing Threshold Pulse Width: 0.5 ms
Lead Channel Sensing Intrinsic Amplitude: 2.2 mV
Lead Channel Sensing Intrinsic Amplitude: 8.1 mV
Lead Channel Setting Pacing Amplitude: 2 V
Lead Channel Setting Pacing Amplitude: 2.5 V
Lead Channel Setting Pacing Pulse Width: 0.5 ms
Lead Channel Setting Sensing Sensitivity: 2 mV
Pulse Gen Model: 2272
Pulse Gen Serial Number: 9004195

## 2020-04-12 NOTE — Progress Notes (Signed)
Remote pacemaker transmission.   

## 2020-04-24 ENCOUNTER — Other Ambulatory Visit: Payer: Self-pay | Admitting: Internal Medicine

## 2020-04-29 DIAGNOSIS — N312 Flaccid neuropathic bladder, not elsewhere classified: Secondary | ICD-10-CM | POA: Diagnosis not present

## 2020-04-29 DIAGNOSIS — Z8546 Personal history of malignant neoplasm of prostate: Secondary | ICD-10-CM | POA: Diagnosis not present

## 2020-05-03 ENCOUNTER — Other Ambulatory Visit: Payer: Self-pay

## 2020-05-03 ENCOUNTER — Ambulatory Visit (INDEPENDENT_AMBULATORY_CARE_PROVIDER_SITE_OTHER): Payer: Medicare Other | Admitting: Internal Medicine

## 2020-05-03 VITALS — BP 122/62 | HR 61 | Ht 70.0 in | Wt 233.0 lb

## 2020-05-03 DIAGNOSIS — I10 Essential (primary) hypertension: Secondary | ICD-10-CM | POA: Diagnosis not present

## 2020-05-03 DIAGNOSIS — I495 Sick sinus syndrome: Secondary | ICD-10-CM

## 2020-05-03 DIAGNOSIS — I480 Paroxysmal atrial fibrillation: Secondary | ICD-10-CM

## 2020-05-03 DIAGNOSIS — Z950 Presence of cardiac pacemaker: Secondary | ICD-10-CM

## 2020-05-03 MED ORDER — METOPROLOL SUCCINATE ER 25 MG PO TB24: 25.0000 mg | ORAL_TABLET | Freq: Every day | ORAL | 3 refills | Status: DC

## 2020-05-03 NOTE — Progress Notes (Signed)
HPI Ernest Parker returns today for ongoing evaluation and management of sinus node dysfunction status post permanent pacemaker insertion.  He has a history of hypertension and dyslipidemia.  He has been maintained on chronic systemic anticoagulation.  He has a history of hypertension, but most recently his blood pressure has been controlled.  Today he complained of fatigue.  He also has some dyspnea with exertion which is chronic Allergies  Allergen Reactions  . Penicillins Other (See Comments)    Patient can't remember reaction was told by mother he had a SERIOUS allergic rxn as a child  DID THE REACTION INVOLVE: Swelling of the face/tongue/throat, SOB, or low BP? Unknown Sudden or severe rash/hives, skin peeling, or the inside of the mouth or nose? Unknown Did it require medical treatment? Unknown When did it last happen?Childhood allergy  If all above answers are "NO", may proceed with cephalosporin use.      Current Outpatient Medications  Medication Sig Dispense Refill  . apixaban (ELIQUIS) 2.5 MG TABS tablet Take 1 tablet (2.5 mg total) by mouth 2 (two) times daily. 60 tablet 11  . Cholecalciferol (VITAMIN D3) 2000 units TABS Take 2,000 Units by mouth daily.     Marland Kitchen diltiazem (CARDIZEM CD) 180 MG 24 hr capsule TAKE 1 CAPSULE BY MOUTH EVERY DAY 90 capsule 2  . furosemide (LASIX) 40 MG tablet Take 1 tablet (40 mg total) by mouth daily. 90 tablet 3  . tamsulosin (FLOMAX) 0.4 MG CAPS capsule Take 1 capsule (0.4 mg total) by mouth daily after supper. (Patient taking differently: Take 0.4 mg by mouth 2 (two) times daily. ) 30 capsule 0  . metoprolol succinate (TOPROL XL) 25 MG 24 hr tablet Take 1 tablet (25 mg total) by mouth daily. 90 tablet 3   No current facility-administered medications for this visit.     Past Medical History:  Diagnosis Date  . A-fib ([place])   . Barrett's esophagus   . Dizzy resolved  . Dyspnea    with exertion occ  . GERD (gastroesophageal reflux  disease)   . Hard of hearing    wears hearing aids bilat  . High cholesterol   . Hypertension   . Presence of permanent cardiac pacemaker 12/17/2017  . Prostate cancer ([place]) 2002   S/P "radiation for 8-9 weeks"  . SOB (shortness of breath)     ROS:   All systems reviewed and negative except as noted in the HPI.   Past Surgical History:  Procedure Laterality Date  . BALLOON DILATION N/A 09/05/2018   Procedure: BALLOON DILATION;  Surgeon: Milus Banister, MD;  Location: [facility] ENDOSCOPY;  Service: Endoscopy;  Laterality: N/A;  . BILIARY STENT PLACEMENT  07/09/2018   Procedure: BILIARY STENT PLACEMENT;  Surgeon: Milus Banister, MD;  Location: [facility] ENDOSCOPY;  Service: Endoscopy;;  . BILIARY STENT PLACEMENT N/A 09/05/2018   Procedure: BILIARY STENT PLACEMENT;  Surgeon: Milus Banister, MD;  [facility] ENDOSCOPY;  Service: Endoscopy;  Laterality: N/A;  . BIOPSY  07/05/2018   Procedure: BIOPSY;  Surgeon: Rush Landmark Telford Nab., MD;  Location: [facility] ENDOSCOPY;  Service: Gastroenterology;;  . CHOLECYSTECTOMY     2020 late april or early may  . ENDOSCOPIC RETROGRADE CHOLANGIOPANCREATOGRAPHY (ERCP) WITH PROPOFOL N/A 09/05/2018   Procedure: ENDOSCOPIC RETROGRADE CHOLANGIOPANCREATOGRAPHY (ERCP) WITH PROPOFOL;  Surgeon: Milus Banister, MD;  [facility] ENDOSCOPY;  Service: Endoscopy;  Laterality: N/A;  . ERCP N/A 07/09/2018   Procedure: ENDOSCOPIC RETROGRADE CHOLANGIOPANCREATOGRAPHY (ERCP);  Surgeon: Owens Loffler  P, MD;  Location: [place];  Service: Endoscopy;  Laterality: N/A;  . ESOPHAGOGASTRODUODENOSCOPY (EGD) WITH PROPOFOL N/A 07/05/2018   Procedure: ESOPHAGOGASTRODUODENOSCOPY (EGD) WITH PROPOFOL;  Surgeon: Rush Landmark Telford Nab., MD;  Location: [place];  Service: Gastroenterology;  Laterality: N/A;  . EUS  07/05/2018   Procedure: FULL UPPER ENDOSCOPIC ULTRASOUND (EUS) RADIAL;  Surgeon: Rush Landmark Telford Nab., MD;  Location: [place];  Service: Gastroenterology;;  .  INGUINAL HERNIA REPAIR Bilateral   . INSERT / REPLACE / REMOVE PACEMAKER  12/17/2017  . IR CHOLANGIOGRAM EXISTING TUBE  09/10/2018  . IR EXCHANGE BILIARY DRAIN  08/14/2018  . IR EXCHANGE BILIARY DRAIN  10/08/2018  . IR PERC CHOLECYSTOSTOMY  07/10/2018  . PACEMAKER IMPLANT N/A 12/17/2017   Procedure: PACEMAKER IMPLANT;  Surgeon: Evans Lance, MD;  Location: [place] CV LAB;  Service: Cardiovascular;  Laterality: N/A;  . PROSTATE BIOPSY    . REMOVAL OF STONES  07/09/2018   Procedure: REMOVAL OF STONES;  Surgeon: Milus Banister, MD;  Location: [facility] ENDOSCOPY;  Service: Endoscopy;;  . [name]  07/09/2018   Procedure: [name];  Surgeon: Milus Banister, MD;  Location: [facility] ENDOSCOPY;  Service: Endoscopy;;  . [name]  09/05/2018   Procedure: [name];  Surgeon: Milus Banister, MD;  [facility];  Service: Endoscopy;;  . Lavell Islam REMOVAL  09/05/2018   Procedure: STENT REMOVAL;  Surgeon: Milus Banister, MD;  [facility];  Service: Endoscopy;;  . STONE EXTRACTION WITH BASKET  09/05/2018   Procedure: STONE EXTRACTION WITH BASKET;  Surgeon: Milus Banister, MD;  [facility] ENDOS[facility];  Service: Endoscopy;;  . THORACIC AORTIC ANEURYSM REPAIR  03/2018  . TRANSURETHRAL RESECTION OF PROSTATE N/A 03/10/2019   Procedure: TRANSURETHRAL RESECTION OF THE PROSTATE (TURP);  Surgeon: Lucas Mallow, MD;  Location: [facility];  Service: Urology;  Laterality: N/A;     Family History  Problem Relation Age of Onset  . Hypertension Mother   . Tuberculosis Father   . Kidney disease Sister   . Sleep apnea Sister      Social History   Socioeconomic History  . Marital status: Widowed    Spouse name: Not on file  . Number of children: Not on file  . Years of education: Not on file  . Highest education level: Not on file  Occupational History  . Occupation: RETIRED  Tobacco Use  . Smoking status: Former Smoker    Packs/day: 0.50    Years: 30.00    Pack  years: 15.00    Types: Cigarettes    Quit date: 1980    Years since quitting: 41.6  . Smokeless tobacco: Never Used  Vaping Use  . Vaping Use: Never used  Substance and Sexual Activity  . Alcohol use: Not Currently    Alcohol/week: 1.0 standard drink    Types: 1 Cans of beer per week  . Drug use: No  . Sexual activity: Not Currently  Other Topics Concern  . 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 Lives alone during the summer in [place] and lives with daughter during the Winter in [place], moving to [place]. Has 1 daughter and 2 sons. One sone lives in [place] and one in [place].   Is an avid golfer, plays 4-5 days per week. Likes to do yardwork.    Social Determinants of Health   Financial Resource Strain:   . Difficulty of Paying Living  Expenses: Not on file  Food Insecurity:   . Worried About Charity fundraiser in the Last Year: Not on file  . Ran Out of Food in the Last Year: Not on file  Transportation Needs:   . Lack of Transportation (Medical): Not on file  . Lack of Transportation (Non-Medical): Not on file  Physical Activity:   . Days of Exercise per Week: Not on file  . Minutes of Exercise per Session: Not on file  Stress:   . Feeling of Stress : Not on file  Social Connections:   . Frequency of Communication with Friends and Family: Not on file  . Frequency of Social Gatherings with Friends and Family: Not on file  . Attends Religious Services: Not on file  . Active Member of Clubs or Organizations: Not on file  . Attends Archivist Meetings: Not on file  . Marital Status: Not on file  Intimate Partner Violence:   . Fear of Current or Ex-Partner: Not on file  . Emotionally Abused: Not on file  . Physically Abused: Not on file  . Sexually Abused: Not on file     BP 122/62   Pulse 61   Ht 5\' 10"  (1.778 m)   Wt 233 lb (105.7 kg)   BMI 33.43 kg/m   Physical Exam:  Well  appearing NAD HEENT: Unremarkable Neck:  No JVD, no thyromegally Lymphatics:  No adenopathy Back:  No CVA tenderness Lungs:  Clear HEART:  Regular rate rhythm, no murmurs, no rubs, no clicks Abd:  soft, positive bowel sounds, no organomegally, no rebound, no guarding Ext:  2 plus pulses, no edema, no cyanosis, no clubbing Skin:  No rashes no nodules Neuro:  CN II through XII intact, motor grossly intact  EKG normal sinus rhythm with incomplete right bundle branch block, old inferior MI  DEVICE  Normal device function.  See PaceArt for details.   Assess/Plan: 1.  Sinus node dysfunction -the patient is asymptomatic status post [facility] Jude dual-chamber pacemaker insertion. 2.  Hypertension -his blood pressure is fairly well controlled. 3.  Fatigue-I have asked the patient to reduce his Toprol to once daily instead of twice daily.  If there is no improvement he is to call us and we will reuptitrate his Toprol. 4.  Permanent pacemaker insertion -his [employer] dual-chamber pacemaker is working normally.  We will recheck in several months.  [name], MD

## 2020-05-03 NOTE — Patient Instructions (Addendum)
Medication Instructions:  Your physician has recommended you make the following change in your medication:   1.  REDUCE your Toprol XL 25 mg-  Take one tablet by mouth at bedtime.  Labwork: None ordered.  Testing/Procedures: None ordered.  Follow-Up: Your physician wants you to follow-up in: one year with Dr. Lovena Le.   You will receive a reminder letter in the mail two months in advance. If you don't receive a letter, please call our office to schedule the follow-up appointment.  Remote monitoring is used to monitor your Pacemaker from home. This monitoring reduces the number of office visits required to check your device to one time per year. It allows [place] to keep an eye on the functioning of your device to ensure it is working properly. You are scheduled for a device check from home on 07/08/2020. You may send your transmission at any time that day. If you have a wireless device, the transmission will be sent automatically. After your physician reviews your transmission, you will receive a postcard with your next transmission date.  Any Other Special Instructions Will Be Listed Below (If Applicable).  If you need a refill on your cardiac medications before your next appointment, please call your pharmacy.

## 2020-05-31 DIAGNOSIS — N312 Flaccid neuropathic bladder, not elsewhere classified: Secondary | ICD-10-CM | POA: Diagnosis not present

## 2020-06-30 DIAGNOSIS — N312 Flaccid neuropathic bladder, not elsewhere classified: Secondary | ICD-10-CM | POA: Diagnosis not present

## 2020-07-02 DIAGNOSIS — Z23 Encounter for immunization: Secondary | ICD-10-CM | POA: Diagnosis not present

## 2020-07-06 ENCOUNTER — Telehealth: Payer: Self-pay

## 2020-07-06 NOTE — Telephone Encounter (Signed)
Attempted to reach patient to assist with remote transmission, no answer.  Left VM with device clinic phone number to return call if assistance is still needed

## 2020-07-06 NOTE — Telephone Encounter (Signed)
-----   Message from Damian Leavell, RN sent at 07/06/2020 10:45 AM EDT ----- Regarding: can you please get a remote check?  Pt with fatigue-increasing

## 2020-07-07 NOTE — Telephone Encounter (Signed)
2nd attempt to reach patient, no answer.  Left detailed messsage with Device clinic # to return call.  Also left message for pt daughter, Baker Janus (DPR on file)

## 2020-07-07 NOTE — Telephone Encounter (Signed)
The pt daughter did call back. She states her husband tried to call [facility] yesterday but they was closed. I told her to try sending the transmission. I gave her verbal instructions. I told her even if they do the transmission after hours the nurse will be able to see the transmission the next day. She agreed to try it today.

## 2020-07-08 ENCOUNTER — Ambulatory Visit (INDEPENDENT_AMBULATORY_CARE_PROVIDER_SITE_OTHER): Payer: Medicare Other

## 2020-07-08 ENCOUNTER — Telehealth: Payer: Self-pay

## 2020-07-08 DIAGNOSIS — R5383 Other fatigue: Secondary | ICD-10-CM

## 2020-07-08 DIAGNOSIS — I495 Sick sinus syndrome: Secondary | ICD-10-CM

## 2020-07-08 DIAGNOSIS — I480 Paroxysmal atrial fibrillation: Secondary | ICD-10-CM

## 2020-07-08 LAB — CUP PACEART REMOTE DEVICE CHECK
Battery Remaining Longevity: 112 mo
Battery Remaining Percentage: 95.5 %
Battery Voltage: 3.01 V
Brady Statistic AP VP Percent: 1 %
Brady Statistic AP VS Percent: 82 %
Brady Statistic AS VP Percent: 1 %
Brady Statistic AS VS Percent: 16 %
Brady Statistic RA Percent Paced: 76 %
Brady Statistic RV Percent Paced: 2.2 %
Date Time Interrogation Session: 20211027181925
Implantable Lead Implant Date: 20190408
Implantable Lead Implant Date: 20190408
Implantable Lead Location: 753859
Implantable Lead Location: 753860
Implantable Pulse Generator Implant Date: 20190408
Lead Channel Impedance Value: 410 Ohm
Lead Channel Impedance Value: 550 Ohm
Lead Channel Pacing Threshold Amplitude: 1 V
Lead Channel Pacing Threshold Amplitude: 1.75 V
Lead Channel Pacing Threshold Pulse Width: 0.5 ms
Lead Channel Pacing Threshold Pulse Width: 0.5 ms
Lead Channel Sensing Intrinsic Amplitude: 2 mV
Lead Channel Sensing Intrinsic Amplitude: 8.5 mV
Lead Channel Setting Pacing Amplitude: 2.5 V
Lead Channel Setting Pacing Amplitude: 3.25 V
Lead Channel Setting Pacing Pulse Width: 0.5 ms
Lead Channel Setting Sensing Sensitivity: 2 mV
Pulse Gen Model: 2272
Pulse Gen Serial Number: 9004195

## 2020-07-08 NOTE — Telephone Encounter (Signed)
Per Dr. Elodia Florence Echo, CBC with diff and TSH for fatigue

## 2020-07-12 ENCOUNTER — Other Ambulatory Visit: Payer: Medicare Other | Admitting: *Deleted

## 2020-07-12 ENCOUNTER — Ambulatory Visit (HOSPITAL_COMMUNITY): Payer: Medicare Other | Attending: Cardiology

## 2020-07-12 ENCOUNTER — Other Ambulatory Visit: Payer: Self-pay

## 2020-07-12 DIAGNOSIS — I480 Paroxysmal atrial fibrillation: Secondary | ICD-10-CM

## 2020-07-12 DIAGNOSIS — R5383 Other fatigue: Secondary | ICD-10-CM

## 2020-07-12 LAB — ECHOCARDIOGRAM COMPLETE
Area-P 1/2: 1.89 cm2
S' Lateral: 3.85 cm

## 2020-07-12 NOTE — Telephone Encounter (Signed)
Following up with patient. Transmission received on 07/07/20. Spoke to daughter (DPR), states patient has felt better over the weekend. States patient has fatigue sometimes and other times he feels ok. States patient comes in today for testing and blood work. Advised to give [place] a call if patient has any concerns or questions. Verbalized understanding.

## 2020-07-13 LAB — CBC WITH DIFFERENTIAL/PLATELET
Basophils Absolute: 0.1 10*3/uL (ref 0.0–0.2)
Basos: 1 %
EOS (ABSOLUTE): 0.5 10*3/uL — ABNORMAL HIGH (ref 0.0–0.4)
Eos: 6 %
Hematocrit: 44.8 % (ref 37.5–51.0)
Hemoglobin: 15.6 g/dL (ref 13.0–17.7)
Immature Grans (Abs): 0.1 10*3/uL (ref 0.0–0.1)
Immature Granulocytes: 1 %
Lymphocytes Absolute: 1.9 10*3/uL (ref 0.7–3.1)
Lymphs: 20 %
MCH: 31.8 pg (ref 26.6–33.0)
MCHC: 34.8 g/dL (ref 31.5–35.7)
MCV: 91 fL (ref 79–97)
Monocytes Absolute: 0.7 10*3/uL (ref 0.1–0.9)
Monocytes: 7 %
Neutrophils Absolute: 5.9 10*3/uL (ref 1.4–7.0)
Neutrophils: 65 %
Platelets: 245 10*3/uL (ref 150–450)
RBC: 4.9 x10E6/uL (ref 4.14–5.80)
RDW: 13.2 % (ref 11.6–15.4)
WBC: 9.1 10*3/uL (ref 3.4–10.8)

## 2020-07-13 LAB — TSH: TSH: 2.91 u[IU]/mL (ref 0.450–4.500)

## 2020-07-13 NOTE — Progress Notes (Signed)
Remote pacemaker transmission.   

## 2020-07-23 DIAGNOSIS — Z23 Encounter for immunization: Secondary | ICD-10-CM | POA: Diagnosis not present

## 2020-08-02 DIAGNOSIS — N312 Flaccid neuropathic bladder, not elsewhere classified: Secondary | ICD-10-CM | POA: Diagnosis not present

## 2020-08-18 ENCOUNTER — Other Ambulatory Visit: Payer: Self-pay | Admitting: Internal Medicine

## 2020-08-30 DIAGNOSIS — R338 Other retention of urine: Secondary | ICD-10-CM | POA: Diagnosis not present

## 2020-08-30 DIAGNOSIS — N312 Flaccid neuropathic bladder, not elsewhere classified: Secondary | ICD-10-CM | POA: Diagnosis not present

## 2020-09-29 DIAGNOSIS — N312 Flaccid neuropathic bladder, not elsewhere classified: Secondary | ICD-10-CM | POA: Diagnosis not present

## 2020-10-07 ENCOUNTER — Ambulatory Visit (INDEPENDENT_AMBULATORY_CARE_PROVIDER_SITE_OTHER): Payer: Medicare Other

## 2020-10-07 DIAGNOSIS — I480 Paroxysmal atrial fibrillation: Secondary | ICD-10-CM

## 2020-10-07 LAB — CUP PACEART REMOTE DEVICE CHECK
Battery Remaining Longevity: 113 mo
Battery Remaining Percentage: 95.5 %
Battery Voltage: 3.01 V
Brady Statistic AP VP Percent: 1.4 %
Brady Statistic AP VS Percent: 69 %
Brady Statistic AS VP Percent: 1 %
Brady Statistic AS VS Percent: 28 %
Brady Statistic RA Percent Paced: 63 %
Brady Statistic RV Percent Paced: 3.1 %
Date Time Interrogation Session: 20220127040050
Implantable Lead Implant Date: 20190408
Implantable Lead Implant Date: 20190408
Implantable Lead Location: 753859
Implantable Lead Location: 753860
Implantable Pulse Generator Implant Date: 20190408
Lead Channel Impedance Value: 400 Ohm
Lead Channel Impedance Value: 510 Ohm
Lead Channel Pacing Threshold Amplitude: 0.625 V
Lead Channel Pacing Threshold Amplitude: 1 V
Lead Channel Pacing Threshold Pulse Width: 0.5 ms
Lead Channel Pacing Threshold Pulse Width: 0.5 ms
Lead Channel Sensing Intrinsic Amplitude: 2.2 mV
Lead Channel Sensing Intrinsic Amplitude: 8.3 mV
Lead Channel Setting Pacing Amplitude: 1.625
Lead Channel Setting Pacing Amplitude: 2.5 V
Lead Channel Setting Pacing Pulse Width: 0.5 ms
Lead Channel Setting Sensing Sensitivity: 2 mV
Pulse Gen Model: 2272
Pulse Gen Serial Number: 9004195

## 2020-10-13 DIAGNOSIS — I739 Peripheral vascular disease, unspecified: Secondary | ICD-10-CM | POA: Diagnosis not present

## 2020-10-13 DIAGNOSIS — Z8546 Personal history of malignant neoplasm of prostate: Secondary | ICD-10-CM | POA: Diagnosis not present

## 2020-10-13 DIAGNOSIS — R7309 Other abnormal glucose: Secondary | ICD-10-CM | POA: Diagnosis not present

## 2020-10-13 DIAGNOSIS — N1832 Chronic kidney disease, stage 3b: Secondary | ICD-10-CM | POA: Diagnosis not present

## 2020-10-13 DIAGNOSIS — I4891 Unspecified atrial fibrillation: Secondary | ICD-10-CM | POA: Diagnosis not present

## 2020-10-13 DIAGNOSIS — Z1389 Encounter for screening for other disorder: Secondary | ICD-10-CM | POA: Diagnosis not present

## 2020-10-13 DIAGNOSIS — I10 Essential (primary) hypertension: Secondary | ICD-10-CM | POA: Diagnosis not present

## 2020-10-13 DIAGNOSIS — E7800 Pure hypercholesterolemia, unspecified: Secondary | ICD-10-CM | POA: Diagnosis not present

## 2020-10-13 DIAGNOSIS — N320 Bladder-neck obstruction: Secondary | ICD-10-CM | POA: Diagnosis not present

## 2020-10-13 DIAGNOSIS — Z0000 Encounter for general adult medical examination without abnormal findings: Secondary | ICD-10-CM | POA: Diagnosis not present

## 2020-10-14 ENCOUNTER — Other Ambulatory Visit: Payer: Self-pay | Admitting: Internal Medicine

## 2020-10-14 DIAGNOSIS — I739 Peripheral vascular disease, unspecified: Secondary | ICD-10-CM

## 2020-10-16 NOTE — Progress Notes (Signed)
Remote pacemaker transmission.   

## 2020-10-20 ENCOUNTER — Ambulatory Visit
Admission: RE | Admit: 2020-10-20 | Discharge: 2020-10-20 | Disposition: A | Discharge status: Home / Self Care | Payer: Medicare Other | Source: Ambulatory Visit | Attending: Internal Medicine | Admitting: Internal Medicine

## 2020-10-20 DIAGNOSIS — I739 Peripheral vascular disease, unspecified: Secondary | ICD-10-CM

## 2020-10-20 DIAGNOSIS — I70211 Atherosclerosis of native arteries of extremities with intermittent claudication, right leg: Secondary | ICD-10-CM | POA: Diagnosis not present

## 2020-10-27 DIAGNOSIS — N312 Flaccid neuropathic bladder, not elsewhere classified: Secondary | ICD-10-CM | POA: Diagnosis not present

## 2020-11-04 ENCOUNTER — Ambulatory Visit (INDEPENDENT_AMBULATORY_CARE_PROVIDER_SITE_OTHER): Payer: Medicare Other | Admitting: Vascular Surgery

## 2020-11-04 ENCOUNTER — Other Ambulatory Visit: Payer: Self-pay | Admitting: *Deleted

## 2020-11-04 ENCOUNTER — Encounter: Payer: Self-pay | Admitting: Vascular Surgery

## 2020-11-04 ENCOUNTER — Other Ambulatory Visit: Payer: Self-pay

## 2020-11-04 VITALS — BP 161/77 | HR 60 | Temp 98.3°F | Resp 20 | Ht 70.0 in | Wt 224.0 lb

## 2020-11-04 DIAGNOSIS — I714 Abdominal aortic aneurysm, without rupture, unspecified: Secondary | ICD-10-CM

## 2020-11-04 DIAGNOSIS — I739 Peripheral vascular disease, unspecified: Secondary | ICD-10-CM | POA: Diagnosis not present

## 2020-11-04 NOTE — Progress Notes (Signed)
Referring Physician: Dr Delfina Redwood  Patient name: Ernest Parker MRN: [id number] DOB: 10/28/1933 Sex: male  REASON FOR CONSULT: Peripheral arterial disease with claudication  HPI: Ernest Parker is a 87 y.o. male, with a several month history of tightness in his right calf with walking.  This occurs after walking about a quarter of a mile.  He does not really have any symptoms in the left leg.  He is a former smoker but quit about 40 years ago.  He does have a history of atrial fibrillation.  He is on Eliquis.  He also has a history of hypertension.  He also had some type of stent graft repair of an aneurysm at [facility] in 2019.  Records not available regarding that today.  He did have an MRCP performed in 2019 for other reasons which showed the aneurysm and a stent graft inside the aneurysm.  He has not had follow-up scans of his aneurysm at least since 2019.  He does not have rest pain in the right foot.  He has no nonhealing wounds.  The pain that he gets in his right calf is somewhat annoying but certainly not lifestyle limiting or disabling to him.   Past Medical History:  Diagnosis Date  . A-fib ([place])   . Barrett's esophagus   . Dizzy resolved  . Dyspnea    with exertion occ  . GERD (gastroesophageal reflux disease)   . Hard of hearing    wears hearing aids bilat  . High cholesterol   . Hypertension   . Presence of permanent cardiac pacemaker 12/17/2017  . Prostate cancer ([place]) 2002   S/P "radiation for 8-9 weeks"  . SOB (shortness of breath)    Past Surgical History:  Procedure Laterality Date  . BALLOON DILATION N/A 09/05/2018   Procedure: BALLOON DILATION;  Surgeon: Milus Banister, MD;  Location: [facility] ENDOSCOPY;  Service: Endoscopy;  Laterality: N/A;  . BILIARY STENT PLACEMENT  07/09/2018   Procedure: BILIARY STENT PLACEMENT;  Surgeon: Milus Banister, MD;  Location: [facility] ENDOSCOPY;  Service: Endoscopy;;  . BILIARY STENT PLACEMENT N/A 09/05/2018   Procedure: BILIARY STENT  PLACEMENT;  Surgeon: Milus Banister, MD;  [facility]: [facility] ENDOSCOPY;  Service: Endoscopy;  Laterality: N/A;  . BIOPSY  07/05/2018   Procedure: BIOPSY;  Surgeon: Rush Landmark Telford Nab., MD;  Location: [facility] ENDOSCOPY;  Service: Gastroenterology;;  . CHOLECYSTECTOMY     2020 late april or early may  . ENDOSCOPIC RETROGRADE CHOLANGIOPANCREATOGRAPHY (ERCP) WITH PROPOFOL N/A 09/05/2018   Procedure: ENDOSCOPIC RETROGRADE CHOLANGIOPANCREATOGRAPHY (ERCP) WITH PROPOFOL;  Surgeon: Milus Banister, MD;  [facility] ENDOSCOPY;  Service: Endoscopy;  Laterality: N/A;  . ERCP N/A 07/09/2018   Procedure: ENDOSCOPIC RETROGRADE CHOLANGIOPANCREATOGRAPHY (ERCP);  Surgeon: Milus Banister, MD;  Location: [facility] ENDOSCOPY;  Service: Endoscopy;  Laterality: N/A;  . ESOPHAGOGASTRODUODENOSCOPY (EGD) WITH PROPOFOL N/A 07/05/2018   Procedure: ESOPHAGOGASTRODUODENOSCOPY (EGD) WITH PROPOFOL;  Surgeon: Rush Landmark Telford Nab., MD;  Location: [place];  Service: Gastroenterology;  Laterality: N/A;  . EUS  07/05/2018   Procedure: FULL UPPER ENDOSCOPIC ULTRASOUND (EUS) RADIAL;  Surgeon: Rush Landmark Telford Nab., MD;  Location: [place];  Service: Gastroenterology;;  . INGUINAL HERNIA REPAIR Bilateral   . INSERT / REPLACE / REMOVE PACEMAKER  12/17/2017  . IR CHOLANGIOGRAM EXISTING TUBE  09/10/2018  . IR EXCHANGE BILIARY DRAIN  08/14/2018  . IR EXCHANGE BILIARY DRAIN  10/08/2018  . IR PERC CHOLECYSTOSTOMY  07/10/2018  . PACEMAKER IMPLANT N/A 12/17/2017   Procedure: PACEMAKER  IMPLANT;  Surgeon: Evans Lance, MD;  Location: [place] CV LAB;  Service: Cardiovascular;  Laterality: N/A;  . PROSTATE BIOPSY    . REMOVAL OF STONES  07/09/2018   Procedure: REMOVAL OF STONES;  Surgeon: Milus Banister, MD;  Location: [facility] ENDOSCOPY;  Service: Endoscopy;;  . [name]  07/09/2018   Procedure: [name];  Surgeon: Milus Banister, MD;  Location: [facility] ENDOSCOPY;  Service: Endoscopy;;  . [name]  09/05/2018    Procedure: [name];  Surgeon: Milus Banister, MD;  [facility] ENDOSCOPY;  Service: Endoscopy;;  . Lavell Islam REMOVAL  09/05/2018   Procedure: STENT REMOVAL;  Surgeon: Milus Banister, MD;  [facility] ENDOSCOPY;  Service: Endoscopy;;  . STONE EXTRACTION WITH BASKET  09/05/2018   Procedure: STONE EXTRACTION WITH BASKET;  Surgeon: Milus Banister, MD;  [facility] ENDOSCOPY;  Service: Endoscopy;;  . THORACIC AORTIC ANEURYSM REPAIR  03/2018  . TRANSURETHRAL RESECTION OF PROSTATE N/A 03/10/2019   Procedure: TRANSURETHRAL RESECTION OF THE PROSTATE (TURP);  Surgeon: Lucas Mallow, MD;  [facility] ORS;  Service: Urology;  Laterality: N/A;    Family History  Problem Relation Age of Onset  . Hypertension Mother   . Tuberculosis Father   . Kidney disease Sister   . Sleep apnea Sister     SOCIAL HISTORY: Social History   Socioeconomic History  . Marital status: Widowed    Spouse name: Not on file  . Number of children: Not on file  . Years of education: Not on file  . Highest education level: Not on file  Occupational History  . Occupation: RETIRED  Tobacco Use  . Smoking status: Former Smoker    Packs/day: 0.50    Years: 30.00    Pack years: 15.00    Types: Cigarettes    Quit date: 1980    Years since quitting: 42.1  . Smokeless tobacco: Never Used  Vaping Use  . Vaping Use: Never used  Substance and Sexual Activity  . Alcohol use: Not Currently    Alcohol/week: 1.0 standard drink    Types: 1 Cans of beer per week  . Drug use: No  . Sexual activity: Not Currently  Other Topics Concern  . 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 Lives alone during the summer in [place] and lives with daughter during the Winter in [place], moving to [place]. Has 1 daughter and 2 sons. One sone lives in [place] and one in [place].   Is an avid golfer, plays 4-5 days per week. Likes to do yardwork.    Social  Determinants of Health   Financial Resource Strain: Not on file  Food Insecurity: Not on file  Transportation Needs: Not on file  Physical Activity: Not on file  Stress: Not on file  Social Connections: Not on file  Intimate Partner Violence: Not on file    Allergies  Allergen Reactions  . Penicillins Other (See Comments)    Patient can't remember reaction was told by mother he had a SERIOUS allergic rxn as a child  DID THE REACTION INVOLVE: Swelling of the face/tongue/throat, SOB, or low BP? Unknown Sudden or severe rash/hives, skin peeling, or the inside of the mouth or nose? Unknown Did it require medical treatment? Unknown When did it last happen?Childhood allergy  If all above answers are "NO", may proceed with cephalosporin use.     Current Outpatient Medications  Medication Sig  Dispense Refill  . apixaban (ELIQUIS) 2.5 MG TABS tablet Take 1 tablet (2.5 mg total) by mouth 2 (two) times daily. 60 tablet 11  . Cholecalciferol (VITAMIN D3) 2000 units TABS Take 2,000 Units by mouth daily.     Marland Kitchen diltiazem (CARDIZEM CD) 180 MG 24 hr capsule TAKE 1 CAPSULE BY MOUTH EVERY DAY 90 capsule 2  . furosemide (LASIX) 40 MG tablet TAKE 1 TABLET BY MOUTH EVERY DAY 90 tablet 2  . tamsulosin (FLOMAX) 0.4 MG CAPS capsule Take 1 capsule (0.4 mg total) by mouth daily after supper. 30 capsule 0   No current facility-administered medications for this visit.    ROS:   General:  No weight loss, Fever, chills  HEENT: No recent headaches, no nasal bleeding, no visual changes, no sore throat  Neurologic: No dizziness, blackouts, seizures. No recent symptoms of stroke or mini- stroke. No recent episodes of slurred speech, or temporary blindness.  Cardiac: No recent episodes of chest pain/pressure, no shortness of breath at rest.  + shortness of breath with exertion.  + history of atrial fibrillation or irregular heartbeat  Vascular: No history of rest pain in feet.  + history of  claudication.  No history of non-healing ulcer, No history of DVT   Pulmonary: No home oxygen, no productive cough, no hemoptysis,  No asthma or wheezing  Musculoskeletal:  [ ]  Arthritis, [ ]  Low back pain,  [ ]  Joint pain  Hematologic:No history of hypercoagulable state.  No history of easy bleeding.  No history of anemia  Gastrointestinal: No hematochezia or melena,  No gastroesophageal reflux, no trouble swallowing  Urinary: [ ]  chronic Kidney disease, [ ]  on HD - [ ]  MWF or [ ]  TTHS, [ ]  Burning with urination, [ ]  Frequent urination, [ ]  Difficulty urinating;   Skin: No rashes  Psychological: No history of anxiety,  No history of depression   Physical Examination  Vitals:   11/04/20 1011  BP: (!) 161/77  Pulse: 60  Resp: 20  Temp: 98.3 F (36.8 C)  SpO2: 94%  Weight: 224 lb (101.6 kg)  Height: 5\' 10"  (1.778 m)    Body mass index is 32.14 kg/m.  General:  Alert and oriented, no acute distress HEENT: Normal Neck: No JVD Cardiac: Regular Rate and Rhythm Abdomen: Soft, non-tender, non-distended, aorta is palpable with no pulsatile mass Skin: No rash Extremity Pulses:  2+ radial, brachial, femoral, absent popliteal dorsalis pedis, posterior tibial pulses bilaterally Musculoskeletal: No deformity or edema  Neurologic: Upper and lower extremity motor 5/5 and symmetric  DATA:  I reviewed the patient's recent ABIs and segmental pressures which showed an ABI of 0.8 bilaterally and pressures consistent with possible superficial femoral artery occlusive disease bilaterally.  ASSESSMENT: Patient has bilateral lower extremity peripheral arterial disease.  Left leg is essentially asymptomatic.  Right leg is the main problem leg.  He was counseled today that he is not at risk of limb loss with the amount of perfusion he currently gets to his legs.  We discussed conservative management with a walking program risk factor modification as well as a possible intervention.  Currently  his lifetime risk of limb loss with his current level of perfusion and risk factors would be less than 5% lifetime.   PLAN: Patient needs follow-up in 3 months time with an aortic ultrasound for continued surveillance of his stent graft.  He will try to walk for 30 minutes daily to improve collaterals and tolerance of his muscles in his  right leg.  We discussed a possible intervention in his lower extremities if his symptoms become disabling or lifestyle limiting to him.  He does not currently feel that he is in that category.  We will repeat his ABIs at his 47-month visit.  Ruta Hinds, MD [facility] [place] Office: [phone]

## 2020-11-24 DIAGNOSIS — N312 Flaccid neuropathic bladder, not elsewhere classified: Secondary | ICD-10-CM | POA: Diagnosis not present

## 2020-11-26 DIAGNOSIS — I10 Essential (primary) hypertension: Secondary | ICD-10-CM

## 2020-11-26 DIAGNOSIS — I480 Paroxysmal atrial fibrillation: Secondary | ICD-10-CM

## 2020-11-26 DIAGNOSIS — R0602 Shortness of breath: Secondary | ICD-10-CM

## 2020-12-01 MED ORDER — FUROSEMIDE 40 MG PO TABS: 40.0000 mg | ORAL_TABLET | Freq: Two times a day (BID) | ORAL | 3 refills | Status: DC

## 2020-12-03 ENCOUNTER — Telehealth: Payer: Self-pay

## 2020-12-03 DIAGNOSIS — R0602 Shortness of breath: Secondary | ICD-10-CM

## 2020-12-03 NOTE — Telephone Encounter (Signed)
Spoke to patients daughter (DPR), states she has been talking with Sonia Baller, RN about patient has had recent complaint of dizziness and and shortness of breath. Daughter states his blood pressure has been elevated, patient is compliant with all medications.   [facility] alert received for HVR rates, appears AF w/ RVR. Longest duration unknown d/t no time given for several events. Daughter states the patient was golfing on 11/23/20 became short of breath and dizzy, states he almost could not make it back to the cart. States this has worsened as well as his blood pressure has been elevated recently. Yesterday during AF RVR events, daughter states patient reported he felt well. Patient has previous hx of asymptomatic when in AF per daughter. Advised daughter I will forward to Dr. Lovena Le for review and we will return call either way for update.

## 2020-12-08 NOTE — Telephone Encounter (Signed)
Start amiodarone 200 mg daily.

## 2020-12-10 NOTE — Telephone Encounter (Signed)
Sent mychart message to family offering f/u appt to discuss amiodarone.

## 2020-12-14 NOTE — Telephone Encounter (Signed)
Received mychart message  Pt does NOT want to start amiodarone.  Per message Pt has felt better on lasix BID and will continue.  Labs are scheduled for this Friday 12/17/20  No action needed at this time.

## 2020-12-17 ENCOUNTER — Other Ambulatory Visit: Payer: Medicare Other | Admitting: *Deleted

## 2020-12-17 ENCOUNTER — Other Ambulatory Visit: Payer: Self-pay

## 2020-12-17 DIAGNOSIS — R0602 Shortness of breath: Secondary | ICD-10-CM

## 2020-12-17 DIAGNOSIS — I10 Essential (primary) hypertension: Secondary | ICD-10-CM | POA: Diagnosis not present

## 2020-12-17 DIAGNOSIS — I480 Paroxysmal atrial fibrillation: Secondary | ICD-10-CM

## 2020-12-17 NOTE — Addendum Note (Signed)
Addended by: Willeen Cass A on: 12/17/2020 09:03 AM   Modules accepted: Orders

## 2020-12-21 LAB — BASIC METABOLIC PANEL
BUN/Creatinine Ratio: 12 (ref 10–24)
BUN: 18 mg/dL (ref 8–27)
CO2: 22 mmol/L (ref 20–29)
Calcium: 9 mg/dL (ref 8.6–10.2)
Chloride: 101 mmol/L (ref 96–106)
Creatinine, Ser: 1.55 mg/dL — ABNORMAL HIGH (ref 0.76–1.27)
Glucose: 182 mg/dL — ABNORMAL HIGH (ref 65–99)
Potassium: 3.8 mmol/L (ref 3.5–5.2)
Sodium: 139 mmol/L (ref 134–144)
eGFR: 44 mL/min/{1.73_m2} — ABNORMAL LOW (ref >59)

## 2020-12-21 LAB — PRO B NATRIURETIC PEPTIDE: NT-Pro BNP: 1003 pg/mL — ABNORMAL HIGH (ref 0–486)

## 2020-12-29 DIAGNOSIS — N312 Flaccid neuropathic bladder, not elsewhere classified: Secondary | ICD-10-CM | POA: Diagnosis not present

## 2021-01-06 ENCOUNTER — Ambulatory Visit (INDEPENDENT_AMBULATORY_CARE_PROVIDER_SITE_OTHER): Payer: Medicare Other

## 2021-01-06 DIAGNOSIS — I495 Sick sinus syndrome: Secondary | ICD-10-CM | POA: Diagnosis not present

## 2021-01-06 DIAGNOSIS — N312 Flaccid neuropathic bladder, not elsewhere classified: Secondary | ICD-10-CM | POA: Diagnosis not present

## 2021-01-06 LAB — CUP PACEART REMOTE DEVICE CHECK
Battery Remaining Longevity: 114 mo
Battery Remaining Percentage: 95.5 %
Battery Voltage: 3.01 V
Brady Statistic AP VP Percent: 1.6 %
Brady Statistic AP VS Percent: 70 %
Brady Statistic AS VP Percent: 1 %
Brady Statistic AS VS Percent: 27 %
Brady Statistic RA Percent Paced: 63 %
Brady Statistic RV Percent Paced: 3.5 %
Date Time Interrogation Session: 20220428034158
Implantable Lead Implant Date: 20190408
Implantable Lead Implant Date: 20190408
Implantable Lead Location: 753859
Implantable Lead Location: 753860
Implantable Pulse Generator Implant Date: 20190408
Lead Channel Impedance Value: 410 Ohm
Lead Channel Impedance Value: 600 Ohm
Lead Channel Pacing Threshold Amplitude: 0.625 V
Lead Channel Pacing Threshold Amplitude: 1 V
Lead Channel Pacing Threshold Pulse Width: 0.5 ms
Lead Channel Pacing Threshold Pulse Width: 0.5 ms
Lead Channel Sensing Intrinsic Amplitude: 10.3 mV
Lead Channel Sensing Intrinsic Amplitude: 3.7 mV
Lead Channel Setting Pacing Amplitude: 1.625
Lead Channel Setting Pacing Amplitude: 2.5 V
Lead Channel Setting Pacing Pulse Width: 0.5 ms
Lead Channel Setting Sensing Sensitivity: 2 mV
Pulse Gen Model: 2272
Pulse Gen Serial Number: 9004195

## 2021-01-07 ENCOUNTER — Telehealth: Payer: Self-pay

## 2021-01-07 NOTE — Telephone Encounter (Addendum)
[facility] alert received for AF with RVR. "Alert remote transmission reviewed.  1 HVR event duration 26:36 minutes; appears AF w/ RVR average V rate 167 bpm.  A burden 8%.  Known PAF.  Meds: Eliquis, Diltiazem.  Forwarding to triage.  RP"  Spoke with patients daughter Ernest Parker (on [place]) whom patient lives with. According to daughter patient was in a significant amt of pain the night of the 27th and morning of 4/28 secondary to his foley cath not draining resulting in overextended bladder. Patient doing well today and not having any symptoms of RVR or volume overload. Offered appointment with Dr. Lovena Le which patient again refused via daughter. Patient also does not want to consider amiodarone with this current event. Compliant with all medications including Eliquis and Diltiazem. Will continue to monitor closely. Encouraged patient/daughter to request appointment for follow up if patient becomes symptomatic or if frequency of RVR increases.

## 2021-01-20 DIAGNOSIS — I10 Essential (primary) hypertension: Secondary | ICD-10-CM | POA: Diagnosis not present

## 2021-01-20 DIAGNOSIS — R339 Retention of urine, unspecified: Secondary | ICD-10-CM | POA: Diagnosis not present

## 2021-01-20 DIAGNOSIS — I4891 Unspecified atrial fibrillation: Secondary | ICD-10-CM | POA: Diagnosis not present

## 2021-01-20 DIAGNOSIS — T83091A Other mechanical complication of indwelling urethral catheter, initial encounter: Secondary | ICD-10-CM | POA: Diagnosis not present

## 2021-01-20 DIAGNOSIS — Z7901 Long term (current) use of anticoagulants: Secondary | ICD-10-CM | POA: Diagnosis not present

## 2021-01-20 DIAGNOSIS — Z7982 Long term (current) use of aspirin: Secondary | ICD-10-CM | POA: Diagnosis not present

## 2021-01-20 DIAGNOSIS — I714 Abdominal aortic aneurysm, without rupture: Secondary | ICD-10-CM | POA: Diagnosis not present

## 2021-01-23 ENCOUNTER — Other Ambulatory Visit: Payer: Self-pay | Admitting: Internal Medicine

## 2021-01-25 ENCOUNTER — Other Ambulatory Visit: Payer: Self-pay

## 2021-01-26 NOTE — Progress Notes (Signed)
Remote pacemaker transmission.   

## 2021-02-01 DIAGNOSIS — N312 Flaccid neuropathic bladder, not elsewhere classified: Secondary | ICD-10-CM | POA: Diagnosis not present

## 2021-02-01 DIAGNOSIS — R338 Other retention of urine: Secondary | ICD-10-CM | POA: Diagnosis not present

## 2021-02-10 ENCOUNTER — Ambulatory Visit (HOSPITAL_COMMUNITY)
Admission: RE | Admit: 2021-02-10 | Discharge: 2021-02-10 | Disposition: A | Discharge status: Home / Self Care | Payer: Medicare Other | Source: Ambulatory Visit | Attending: Vascular Surgery | Admitting: Vascular Surgery

## 2021-02-10 ENCOUNTER — Encounter: Payer: Self-pay | Admitting: Vascular Surgery

## 2021-02-10 ENCOUNTER — Ambulatory Visit (INDEPENDENT_AMBULATORY_CARE_PROVIDER_SITE_OTHER)
Admission: RE | Admit: 2021-02-10 | Discharge: 2021-02-10 | Disposition: A | Discharge status: Home / Self Care | Payer: Medicare Other | Source: Ambulatory Visit | Attending: Vascular Surgery | Admitting: Vascular Surgery

## 2021-02-10 ENCOUNTER — Ambulatory Visit (INDEPENDENT_AMBULATORY_CARE_PROVIDER_SITE_OTHER): Payer: Medicare Other | Admitting: Vascular Surgery

## 2021-02-10 ENCOUNTER — Other Ambulatory Visit: Payer: Self-pay

## 2021-02-10 VITALS — BP 153/84 | HR 62 | Temp 98.1°F | Resp 20 | Ht 70.0 in | Wt 216.2 lb

## 2021-02-10 DIAGNOSIS — I714 Abdominal aortic aneurysm, without rupture, unspecified: Secondary | ICD-10-CM

## 2021-02-10 DIAGNOSIS — I739 Peripheral vascular disease, unspecified: Secondary | ICD-10-CM | POA: Insufficient documentation

## 2021-02-10 NOTE — Progress Notes (Signed)
Patient is a 87 year old male who returns for follow-up today.  He was last seen February 2022 with complaints of tightness in his right calf with walking.  His ABIs were 0.8 bilaterally in February.  He had no symptoms in his left leg.  Today he complains of similar symptoms now in both legs.  He is able to walk about 3 blocks before experiencing bilateral calf pain.  He states both legs are essentially equal at this point.  He does not describe rest pain.  He does not have any nonhealing wounds. At his last visit he was placed on a 30-minute walking program daily.  He has tried to walk more but has not really been diligent on doing a full 30 minutes.  He states that he goes until he has pain and then he comes in and rests and goes for another walk later in the day.  He had an aneurysm stent graft repair at [facility] in 2019.  He returns today after follow-up ultrasound for surveillance of this.   He is on Eliquis for atrial fibrillation.  He also has hypertension.   Past Medical History:  Diagnosis Date  . A-fib ([place])   . Barrett's esophagus   . Dizzy resolved  . Dyspnea    with exertion occ  . GERD (gastroesophageal reflux disease)   . Hard of hearing    wears hearing aids bilat  . High cholesterol   . Hypertension   . Presence of permanent cardiac pacemaker 12/17/2017  . Prostate cancer ([place]) 2002   S/P "radiation for 8-9 weeks"  . SOB (shortness of breath)     Past Surgical History:  Procedure Laterality Date  . BALLOON DILATION N/A 09/05/2018   Procedure: BALLOON DILATION;  Surgeon: Milus Banister, MD;  Location: [facility] ENDOSCOPY;  Service: Endoscopy;  Laterality: N/A;  . BILIARY STENT PLACEMENT  07/09/2018   Procedure: BILIARY STENT PLACEMENT;  Surgeon: Milus Banister, MD;  Location: [facility] ENDOSCOPY;  Service: Endoscopy;;  . BILIARY STENT PLACEMENT N/A 09/05/2018   Procedure: BILIARY STENT PLACEMENT;  Surgeon: Milus Banister, MD;  [facility] ENDOSCOPY;  Service: Endoscopy;   Laterality: N/A;  . BIOPSY  07/05/2018   Procedure: BIOPSY;  Surgeon: Rush Landmark Telford Nab., MD;  Location: [facility] ENDOSCOPY;  Service: Gastroenterology;;  . CHOLECYSTECTOMY     2020 late april or early may  . ENDOSCOPIC RETROGRADE CHOLANGIOPANCREATOGRAPHY (ERCP) WITH PROPOFOL N/A 09/05/2018   Procedure: ENDOSCOPIC RETROGRADE CHOLANGIOPANCREATOGRAPHY (ERCP) WITH PROPOFOL;  Surgeon: Milus Banister, MD;  Location[facility] ENDOSCOPY;  Service: Endoscopy;  Laterality: N/A;  . ERCP N/A 07/09/2018   Procedure: ENDOSCOPIC RETROGRADE CHOLANGIOPANCREATOGRAPHY (ERCP);  Surgeon: Milus Banister, MD;  Location: [facility] ENDOSCOPY;  Service: Endoscopy;  Laterality: N/A;  . ESOPHAGOGASTRODUODENOSCOPY (EGD) WITH PROPOFOL N/A 07/05/2018   Procedure: ESOPHAGOGASTRODUODENOSCOPY (EGD) WITH PROPOFOL;  Surgeon: Rush Landmark Telford Nab., MD;  Location: [place];  Service: Gastroenterology;  Laterality: N/A;  . EUS  07/05/2018   Procedure: FULL UPPER ENDOSCOPIC ULTRASOUND (EUS) RADIAL;  Surgeon: Rush Landmark Telford Nab., MD;  Location: [place];  Service: Gastroenterology;;  . INGUINAL HERNIA REPAIR Bilateral   . INSERT / REPLACE / REMOVE PACEMAKER  12/17/2017  . IR CHOLANGIOGRAM EXISTING TUBE  09/10/2018  . IR EXCHANGE BILIARY DRAIN  08/14/2018  . IR EXCHANGE BILIARY DRAIN  10/08/2018  . IR PERC CHOLECYSTOSTOMY  07/10/2018  . PACEMAKER IMPLANT N/A 12/17/2017   Procedure: PACEMAKER IMPLANT;  Surgeon: Evans Lance, MD;  Location: [place] CV LAB;  Service: Cardiovascular;  Laterality: N/A;  . PROSTATE BIOPSY    . REMOVAL OF STONES  07/09/2018   Procedure: REMOVAL OF STONES;  Surgeon: Milus Banister, MD;  Location: [facility] ENDOSCOPY;  Service: Endoscopy;;  . [name]  07/09/2018   Procedure: [name];  Surgeon: Milus Banister, MD;  Location: [facility] ENDOSCOPY;  Service: Endoscopy;;  . [name]  09/05/2018   Procedure: [name];  Surgeon: Milus Banister, MD;  [facility] ENDOSCOPY;  Service:  Endoscopy;;  . Lavell Islam REMOVAL  09/05/2018   Procedure: STENT REMOVAL;  Surgeon: Milus Banister, MD;  [facility] ENDOSCOPY;  Service: Endoscopy;;  . STONE EXTRACTION WITH BASKET  09/05/2018   Procedure: STONE EXTRACTION WITH BASKET;  Surgeon: Milus Banister, MD;  [facility] ENDOSCOPY;  Service: Endoscopy;;  . THORACIC AORTIC ANEURYSM REPAIR  03/2018  . TRANSURETHRAL RESECTION OF PROSTATE N/A 03/10/2019   Procedure: TRANSURETHRAL RESECTION OF THE PROSTATE (TURP);  Surgeon: Lucas Mallow, MD;  [facility] ORS;  Service: Urology;  Laterality: N/A;    Review of systems: He has no chest pain.  He has no shortness of breath.  Data: He had a duplex ultrasound of his abdominal aorta today.  This showed an aortic diameter of 6.33 cm x 7.26 cm.  Previous diameter measurement was 9.7 x 9.6 in July 2019 in [place].  There was no endoleak.  Physical exam:  Vitals:   02/10/21 0930  BP: (!) 153/84  Pulse: 62  Resp: 20  Temp: 98.1 F (36.7 C)  SpO2: 94%  Weight: 216 lb 3.2 oz (98.1 kg)  Height: 5\' 10"  (1.778 m)    Abdomen: soft nontender no pulsatile mass  Extremities: 2+ femoral pulses absent popliteal pedal pulses bilaterally  Skin: No open wound or ulcer  Data: Patient had bilateral ABIs performed today which were 0.7 bilaterally.  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  Patient status post endovascular aneurysm repair elsewhere aneurysm diameter has decreased from 9.7 to 7.2 cm needs follow-up ultrasound in 1 year  2.  Peripheral arterial disease mild claudication symptoms.  I discussed with the patient today the possibility of arteriogram intervention versus continued conservative management.  He feels that currently his symptoms are not severe enough that he wants an intervention.  He will follow-up in 3 months time for repeat ABIs and a  duplex scan and further discussions regarding this.  Ruta Hinds, MD [facility] [place] Office: [phone]

## 2021-02-13 ENCOUNTER — Other Ambulatory Visit: Payer: Self-pay

## 2021-02-13 ENCOUNTER — Emergency Department (HOSPITAL_COMMUNITY)
Admission: EM | Admit: 2021-02-13 | Discharge: 2021-02-13 | Disposition: A | Discharge status: Home / Self Care | Payer: Medicare Other | Attending: Emergency Medicine | Admitting: Emergency Medicine

## 2021-02-13 ENCOUNTER — Encounter (HOSPITAL_COMMUNITY): Payer: Self-pay | Admitting: Emergency Medicine

## 2021-02-13 DIAGNOSIS — Z8546 Personal history of malignant neoplasm of prostate: Secondary | ICD-10-CM | POA: Diagnosis not present

## 2021-02-13 DIAGNOSIS — Z87891 Personal history of nicotine dependence: Secondary | ICD-10-CM | POA: Insufficient documentation

## 2021-02-13 DIAGNOSIS — R339 Retention of urine, unspecified: Secondary | ICD-10-CM | POA: Insufficient documentation

## 2021-02-13 DIAGNOSIS — I129 Hypertensive chronic kidney disease with stage 1 through stage 4 chronic kidney disease, or unspecified chronic kidney disease: Secondary | ICD-10-CM | POA: Insufficient documentation

## 2021-02-13 DIAGNOSIS — N183 Chronic kidney disease, stage 3 unspecified: Secondary | ICD-10-CM | POA: Insufficient documentation

## 2021-02-13 DIAGNOSIS — Z79899 Other long term (current) drug therapy: Secondary | ICD-10-CM | POA: Diagnosis not present

## 2021-02-13 DIAGNOSIS — I480 Paroxysmal atrial fibrillation: Secondary | ICD-10-CM | POA: Diagnosis not present

## 2021-02-13 DIAGNOSIS — R1030 Lower abdominal pain, unspecified: Secondary | ICD-10-CM | POA: Insufficient documentation

## 2021-02-13 DIAGNOSIS — Z7901 Long term (current) use of anticoagulants: Secondary | ICD-10-CM | POA: Diagnosis not present

## 2021-02-13 DIAGNOSIS — Z950 Presence of cardiac pacemaker: Secondary | ICD-10-CM | POA: Diagnosis not present

## 2021-02-13 LAB — URINALYSIS, ROUTINE W REFLEX MICROSCOPIC
Bilirubin Urine: NEGATIVE
Glucose, UA: NEGATIVE mg/dL
Ketones, ur: NEGATIVE mg/dL
Nitrite: POSITIVE — AB
Protein, ur: 30 mg/dL — AB
RBC / HPF: >50 RBC/hpf — ABNORMAL HIGH (ref 0–5)
Specific Gravity, Urine: 1.012 (ref 1.005–1.030)
pH: 9 — ABNORMAL HIGH (ref 5.0–8.0)

## 2021-02-13 MED ORDER — SULFAMETHOXAZOLE-TRIMETHOPRIM 800-160 MG PO TABS
1.0000 | ORAL_TABLET | Freq: Once | ORAL | Status: AC
  Administered 2021-02-13: 1 via ORAL
  Filled 2021-02-13: qty 1

## 2021-02-13 MED ORDER — SULFAMETHOXAZOLE-TRIMETHOPRIM 800-160 MG PO TABS: 1.0000 | ORAL_TABLET | Freq: Two times a day (BID) | ORAL | 0 refills | Status: AC

## 2021-02-13 NOTE — ED Triage Notes (Signed)
Patient reports urine bag was empty this morning when waking up. He reports he typically wakes up at 4 AM to drain the bag. He reports bladder fullness and noticed some urine leaking around the catheter. He has had issues with catheter blockages in the past. He has a hx of prostate cancer.

## 2021-02-13 NOTE — ED Provider Notes (Signed)
[facility] DEPT Provider Note   CSN: [id number] Arrival date & time: 02/13/21  0907     History No chief complaint on file.   Ernest Parker is a 87 y.o. male.  HPI Patient presents with concern of decreased urine production through his catheter and leakage around.  Patient has minimal lower abdominal discomfort, no fever, nausea, vomiting.  He has had a Foley catheter for months secondary to obstruction attributed to his prior prostate cancer surgery.  He notes that he has had Foley catheter changes about every 2 months, but recently has required interval adjustments.  Now, over the past half day the patient has had negligible urine production into the bag, and that which is there has some sediment with it. As above, no fever, no vomiting, no consistent pain.    Past Medical History:  Diagnosis Date  . A-fib ([place])   . Barrett's esophagus   . Dizzy resolved  . Dyspnea    with exertion occ  . GERD (gastroesophageal reflux disease)   . Hard of hearing    wears hearing aids bilat  . High cholesterol   . Hypertension   . Presence of permanent cardiac pacemaker 12/17/2017  . Prostate cancer ([place]) 2002   S/P "radiation for 8-9 weeks"  . SOB (shortness of breath)     Patient Active Problem List   Diagnosis Date Noted  . Pacemaker 05/03/2020  . Urinary retention 03/10/2019  . Cholecystitis   . Gall stones, common bile duct   . Mallory-Weiss syndrome   . Cirrhosis of liver without ascites ([place])   . E coli bacteremia   . PAF (paroxysmal atrial fibrillation) ([place]) 07/04/2018  . Elevated troponin 07/04/2018  . Syncope and collapse 07/01/2018  . Abnormal LFTs 07/01/2018  . CKD (chronic kidney disease) stage 3, GFR 30-59 ml/min (HCC) 07/01/2018  . Sinus node dysfunction ([place]) 12/17/2017  . TIA (transient ischemic attack) 11/12/2017  . History of prostate cancer 11/12/2017  . Atrial fibrillation with rapid ventricular response ([place])   . Essential  hypertension     Past Surgical History:  Procedure Laterality Date  . BALLOON DILATION N/A 09/05/2018   Procedure: BALLOON DILATION;  Surgeon: Milus Banister, MD;  Location: [facility] ENDOSCOPY;  Service: Endoscopy;  Laterality: N/A;  . BILIARY STENT PLACEMENT  07/09/2018   Procedure: BILIARY STENT PLACEMENT;  Surgeon: Milus Banister, MD;  Location: [facility] ENDOSCOPY;  Service: Endoscopy;;  . BILIARY STENT PLACEMENT N/A 09/05/2018   Procedure: BILIARY STENT PLACEMENT;  Surgeon: Milus Banister, MD;  Location: [facility] ENDOSCOPY;  Service: Endoscopy;  Laterality: N/A;  . BIOPSY  07/05/2018   Procedure: BIOPSY;  Surgeon: Rush Landmark Telford Nab., MD;  Location: [facility] ENDOSCOPY;  Service: Gastroenterology;;  . CHOLECYSTECTOMY     2020 late april or early may  . ENDOSCOPIC RETROGRADE CHOLANGIOPANCREATOGRAPHY (ERCP) WITH PROPOFOL N/A 09/05/2018   Procedure: ENDOSCOPIC RETROGRADE CHOLANGIOPANCREATOGRAPHY (ERCP) WITH PROPOFOL;  Surgeon: Milus Banister, MD;  Location: [facility] ENDOSCOPY;  Service: Endoscopy;  Laterality: N/A;  . ERCP N/A 07/09/2018   Procedure: ENDOSCOPIC RETROGRADE CHOLANGIOPANCREATOGRAPHY (ERCP);  Surgeon: Milus Banister, MD;  Location: [facility] ENDOSCOPY;  Service: Endoscopy;  Laterality: N/A;  . ESOPHAGOGASTRODUODENOSCOPY (EGD) WITH PROPOFOL N/A 07/05/2018   Procedure: ESOPHAGOGASTRODUODENOSCOPY (EGD) WITH PROPOFOL;  Surgeon: Rush Landmark Telford Nab., MD;  Location: [facility];  Service: Gastroenterology;  Laterality: N/A;  . EUS  07/05/2018   Procedure: FULL UPPER ENDOSCOPIC ULTRASOUND (EUS) RADIAL;  Surgeon: Rush Landmark Telford Nab., MD;  Location: [facility];  Service: Gastroenterology;;  . INGUINAL HERNIA REPAIR Bilateral   . INSERT / REPLACE / REMOVE PACEMAKER  12/17/2017  . IR CHOLANGIOGRAM EXISTING TUBE  09/10/2018  . IR EXCHANGE BILIARY DRAIN  08/14/2018  . IR EXCHANGE BILIARY DRAIN  10/08/2018  . IR PERC CHOLECYSTOSTOMY  07/10/2018  . PACEMAKER IMPLANT N/A 12/17/2017   Procedure: PACEMAKER  IMPLANT;  Surgeon: Evans Lance, MD;  Location: [facility] CV LAB;  Service: Cardiovascular;  Laterality: N/A;  . PROSTATE BIOPSY    . REMOVAL OF STONES  07/09/2018   Procedure: REMOVAL OF STONES;  Surgeon: Milus Banister, MD;  Location: [facility] ENDOSCOPY;  Service: Endoscopy;;  . [name]  07/09/2018   Procedure: [name];  Surgeon: Milus Banister, MD;  Location: [facility] ENDOSCOPY;  Service: Endoscopy;;  . [name]  09/05/2018   Procedure: [name];  Surgeon: Milus Banister, MD;  Location: [facility] ENDOSCOPY;  Service: Endoscopy;;  . Lavell Islam REMOVAL  09/05/2018   Procedure: STENT REMOVAL;  Surgeon: Milus Banister, MD;  Location: [facility] ENDOSCOPY;  Service: Endoscopy;;  . STONE EXTRACTION WITH BASKET  09/05/2018   Procedure: STONE EXTRACTION WITH BASKET;  Surgeon: Milus Banister, MD;  Location: [facility] ENDOSCOPY;  Service: Endoscopy;;  . THORACIC AORTIC ANEURYSM REPAIR  03/2018  . TRANSURETHRAL RESECTION OF PROSTATE N/A 03/10/2019   Procedure: TRANSURETHRAL RESECTION OF THE PROSTATE (TURP);  Surgeon: Lucas Mallow, MD;  Location: [facility] ORS;  Service: Urology;  Laterality: N/A;       Family History  Problem Relation Age of Onset  . Hypertension Mother   . Tuberculosis Father   . Kidney disease Sister   . Sleep apnea Sister     Social History   Tobacco Use  . Smoking status: Former Smoker    Packs/day: 0.50    Years: 30.00    Pack years: 15.00    Types: Cigarettes    Quit date: 1980    Years since quitting: 42.4  . Smokeless tobacco: Never Used  Vaping Use  . Vaping Use: Never used  Substance Use Topics  . Alcohol use: Not Currently    Alcohol/week: 1.0 standard drink    Types: 1 Cans of beer per week  . Drug use: No    Home Medications Prior to Admission medications   Medication Sig Start Date End Date Taking? Authorizing Provider  sulfamethoxazole-trimethoprim (BACTRIM DS) 800-160 MG tablet Take 1 tablet by mouth 2 (two) times daily for 5 days. 02/13/21  02/18/21 Yes Carmin Muskrat, MD  apixaban (ELIQUIS) 2.5 MG TABS tablet Take 1 tablet (2.5 mg total) by mouth 2 (two) times daily. 03/16/20   Evans Lance, MD  Cholecalciferol (VITAMIN D3) 2000 units TABS Take 2,000 Units by mouth daily.     [provider]  diltiazem (CARDIZEM CD) 180 MG 24 hr capsule TAKE 1 CAPSULE BY MOUTH EVERY DAY 01/25/21   Evans Lance, MD  furosemide (LASIX) 40 MG tablet Take 1 tablet (40 mg total) by mouth 2 (two) times daily. 12/01/20 03/01/21  Evans Lance, MD  tamsulosin (FLOMAX) 0.4 MG CAPS capsule Take 1 capsule (0.4 mg total) by mouth daily after supper. 03/15/18   Evans Lance, MD    Allergies    Penicillins  Review of Systems   Review of Systems  Constitutional:       Per HPI, otherwise negative  HENT:       Per HPI, otherwise negative  Respiratory:       Per HPI, otherwise negative  Cardiovascular:       Per HPI, otherwise negative  Gastrointestinal: Negative for vomiting.  Endocrine:       Negative aside from HPI  Genitourinary:       Neg aside from HPI   Musculoskeletal:       Per HPI, otherwise negative  Skin: Negative.   Neurological: Negative for syncope.    Physical Exam Updated Vital Signs BP (!) 164/81   Pulse (!) 58   Temp (!) 97.5 F (36.4 C) (Oral)   Resp 16   Wt 99.8 kg   SpO2 95%   BMI 31.58 kg/m   Physical Exam Vitals and nursing note reviewed.  Constitutional:      General: He is not in acute distress.    Appearance: He is well-developed.  HENT:     Head: Normocephalic and atraumatic.  Eyes:     Conjunctiva/sclera: Conjunctivae normal.  Cardiovascular:     Rate and Rhythm: Normal rate and regular rhythm.  Pulmonary:     Effort: Pulmonary effort is normal. No respiratory distress.     Breath sounds: No stridor.  Abdominal:     General: There is no distension.  Genitourinary:   Skin:    General: Skin is warm and dry.  Neurological:     Mental Status: He is alert and oriented to person,  place, and time.     ED Results / Procedures / Treatments   Labs (all labs ordered are listed, but only abnormal results are displayed) Labs Reviewed  URINALYSIS, ROUTINE W REFLEX MICROSCOPIC - Abnormal; Notable for the following components:      Result Value   APPearance CLOUDY (*)    pH 9.0 (*)    Hgb urine dipstick SMALL (*)    Protein, ur 30 (*)    Nitrite POSITIVE (*)    Leukocytes,Ua LARGE (*)    RBC / HPF >50 (*)    Bacteria, UA RARE (*)    All other components within normal limits    EKG None  Radiology No results found.  Procedures Procedures   Medications Ordered in ED Medications  sulfamethoxazole-trimethoprim (BACTRIM DS) 800-160 MG per tablet 1 tablet (1 tablet Oral Given 02/13/21 1451)    ED Course  I have reviewed the triage vital signs and the nursing notes.  Pertinent labs & imaging results that were available during my care of the patient were reviewed by me and considered in my medical decision making (see chart for details).    3:03 PM On repeat exam patient is awake, alert, in no distress.  Foley catheter has been exchanged, is draining appropriately. Remains afebrile, without complaints, he and I discussed all findings including urinalysis suggesting infection.  No evidence for bacteremia, sepsis given his description of no other concerns. With indwelling catheter patient will start a course of antibiotics, follow-up with his primary care physician.  Final Clinical Impression(s) / ED Diagnoses Final diagnoses:  Urinary retention    Rx / DC Orders ED Discharge Orders         Ordered    sulfamethoxazole-trimethoprim (BACTRIM DS) 800-160 MG tablet  2 times daily        02/13/21 1438           Carmin Muskrat, MD 02/13/21 1503

## 2021-02-13 NOTE — ED Provider Notes (Signed)
Emergency Medicine Provider Triage Evaluation Note  Ernest Parker , a 87 y.o. male  was evaluated in triage.  Pt complains of Catheter blocked. Hx of same - last time was sediment. No urinary op overnight. + leakeage around the catheter. + urinary urgency and abd distnsion..  Review of Systems  Positive: Urinary retention Negative: Fever  Physical Exam  BP (!) 172/92   Pulse (!) 59   Temp (!) 97.5 F (36.4 C) (Oral)   Resp 18   SpO2 95%  Gen:   Awake, no distress   Resp:  Normal effort  MSK:   Moves extremities without difficulty  Other:  Catheter in place, No OP in bag. No Bleeding. Bladder palpably distended.  Medical Decision Making  Medically screening exam initiated at 10:00 AM.  Appropriate orders placed.  Ernest Parker was informed that the remainder of the evaluation will be completed by another provider, this initial triage assessment does not replace that evaluation, and the importance of remaining in the ED until their evaluation is complete.  URianary catheter blocked. Cather in place form hx of retention. There for the 3 years Hx of stricture.Hx of prostate   [name], [name] 02/13/21 1004    Carmin Muskrat, MD 02/13/21 1248

## 2021-02-13 NOTE — Discharge Instructions (Signed)
Monitor your condition carefully and do not hesitate to return here for concerning changes. 

## 2021-02-15 ENCOUNTER — Other Ambulatory Visit: Payer: Self-pay

## 2021-02-15 DIAGNOSIS — I739 Peripheral vascular disease, unspecified: Secondary | ICD-10-CM

## 2021-03-02 DIAGNOSIS — R338 Other retention of urine: Secondary | ICD-10-CM | POA: Diagnosis not present

## 2021-03-02 DIAGNOSIS — N312 Flaccid neuropathic bladder, not elsewhere classified: Secondary | ICD-10-CM | POA: Diagnosis not present

## 2021-03-12 ENCOUNTER — Other Ambulatory Visit: Payer: Self-pay | Admitting: Internal Medicine

## 2021-03-12 DIAGNOSIS — I4891 Unspecified atrial fibrillation: Secondary | ICD-10-CM

## 2021-03-15 NOTE — Telephone Encounter (Signed)
Eliquis 2.5mg  refill request received. Patient is 87 years old, weight-99.8kg, Crea-1.55 on 12/17/20, Diagnosis-Afib, and last seen by Dr. Lovena Le on 05/03/2020. Dose is appropriate based on dosing criteria. Will send in refill to requested pharmacy.

## 2021-03-21 DIAGNOSIS — R338 Other retention of urine: Secondary | ICD-10-CM | POA: Diagnosis not present

## 2021-03-21 DIAGNOSIS — N312 Flaccid neuropathic bladder, not elsewhere classified: Secondary | ICD-10-CM | POA: Diagnosis not present

## 2021-04-06 DIAGNOSIS — N312 Flaccid neuropathic bladder, not elsewhere classified: Secondary | ICD-10-CM | POA: Diagnosis not present

## 2021-04-06 DIAGNOSIS — R8271 Bacteriuria: Secondary | ICD-10-CM | POA: Diagnosis not present

## 2021-04-07 ENCOUNTER — Ambulatory Visit (INDEPENDENT_AMBULATORY_CARE_PROVIDER_SITE_OTHER): Payer: Medicare Other

## 2021-04-07 DIAGNOSIS — I495 Sick sinus syndrome: Secondary | ICD-10-CM | POA: Diagnosis not present

## 2021-04-07 LAB — CUP PACEART REMOTE DEVICE CHECK
Battery Remaining Longevity: 79 mo
Battery Remaining Percentage: 70 %
Battery Voltage: 2.99 V
Brady Statistic AP VP Percent: 1.7 %
Brady Statistic AP VS Percent: 70 %
Brady Statistic AS VP Percent: 1.1 %
Brady Statistic AS VS Percent: 26 %
Brady Statistic RA Percent Paced: 62 %
Brady Statistic RV Percent Paced: 4 %
Date Time Interrogation Session: 20220728020016
Implantable Lead Implant Date: 20190408
Implantable Lead Implant Date: 20190408
Implantable Lead Location: 753859
Implantable Lead Location: 753860
Implantable Pulse Generator Implant Date: 20190408
Lead Channel Impedance Value: 410 Ohm
Lead Channel Impedance Value: 600 Ohm
Lead Channel Pacing Threshold Amplitude: 0.625 V
Lead Channel Pacing Threshold Amplitude: 1 V
Lead Channel Pacing Threshold Pulse Width: 0.5 ms
Lead Channel Pacing Threshold Pulse Width: 0.5 ms
Lead Channel Sensing Intrinsic Amplitude: 11.3 mV
Lead Channel Sensing Intrinsic Amplitude: 3.1 mV
Lead Channel Setting Pacing Amplitude: 1.625
Lead Channel Setting Pacing Amplitude: 2.5 V
Lead Channel Setting Pacing Pulse Width: 0.5 ms
Lead Channel Setting Sensing Sensitivity: 2 mV
Pulse Gen Model: 2272
Pulse Gen Serial Number: 9004195

## 2021-04-12 DIAGNOSIS — I10 Essential (primary) hypertension: Secondary | ICD-10-CM | POA: Diagnosis not present

## 2021-04-12 DIAGNOSIS — E1122 Type 2 diabetes mellitus with diabetic chronic kidney disease: Secondary | ICD-10-CM | POA: Diagnosis not present

## 2021-04-12 DIAGNOSIS — E7800 Pure hypercholesterolemia, unspecified: Secondary | ICD-10-CM | POA: Diagnosis not present

## 2021-04-12 DIAGNOSIS — I4891 Unspecified atrial fibrillation: Secondary | ICD-10-CM | POA: Diagnosis not present

## 2021-04-12 DIAGNOSIS — I739 Peripheral vascular disease, unspecified: Secondary | ICD-10-CM | POA: Diagnosis not present

## 2021-04-12 DIAGNOSIS — N320 Bladder-neck obstruction: Secondary | ICD-10-CM | POA: Diagnosis not present

## 2021-04-12 DIAGNOSIS — N1832 Chronic kidney disease, stage 3b: Secondary | ICD-10-CM | POA: Diagnosis not present

## 2021-04-12 DIAGNOSIS — E1165 Type 2 diabetes mellitus with hyperglycemia: Secondary | ICD-10-CM | POA: Diagnosis not present

## 2021-04-12 DIAGNOSIS — E1151 Type 2 diabetes mellitus with diabetic peripheral angiopathy without gangrene: Secondary | ICD-10-CM | POA: Diagnosis not present

## 2021-04-27 ENCOUNTER — Other Ambulatory Visit: Payer: Self-pay | Admitting: Internal Medicine

## 2021-04-29 DIAGNOSIS — Z8546 Personal history of malignant neoplasm of prostate: Secondary | ICD-10-CM | POA: Diagnosis not present

## 2021-04-29 DIAGNOSIS — N312 Flaccid neuropathic bladder, not elsewhere classified: Secondary | ICD-10-CM | POA: Diagnosis not present

## 2021-04-29 DIAGNOSIS — R338 Other retention of urine: Secondary | ICD-10-CM | POA: Diagnosis not present

## 2021-05-03 NOTE — Progress Notes (Signed)
Remote pacemaker transmission.   

## 2021-05-12 ENCOUNTER — Ambulatory Visit (HOSPITAL_COMMUNITY)
Admission: RE | Admit: 2021-05-12 | Discharge: 2021-05-12 | Disposition: A | Discharge status: Home / Self Care | Payer: Medicare Other | Source: Ambulatory Visit | Attending: Vascular Surgery | Admitting: Vascular Surgery

## 2021-05-12 ENCOUNTER — Ambulatory Visit (INDEPENDENT_AMBULATORY_CARE_PROVIDER_SITE_OTHER)
Admission: RE | Admit: 2021-05-12 | Discharge: 2021-05-12 | Disposition: A | Discharge status: Home / Self Care | Payer: Medicare Other | Source: Ambulatory Visit | Attending: Vascular Surgery | Admitting: Vascular Surgery

## 2021-05-12 ENCOUNTER — Ambulatory Visit (INDEPENDENT_AMBULATORY_CARE_PROVIDER_SITE_OTHER): Payer: Medicare Other | Admitting: Physician Assistant

## 2021-05-12 ENCOUNTER — Other Ambulatory Visit: Payer: Self-pay

## 2021-05-12 VITALS — BP 187/74 | HR 60 | Temp 97.9°F | Resp 20 | Ht 70.0 in | Wt 216.0 lb

## 2021-05-12 DIAGNOSIS — I739 Peripheral vascular disease, unspecified: Secondary | ICD-10-CM

## 2021-05-12 NOTE — Progress Notes (Signed)
Peripheral Arterial Disease Follow-Up   VASCULAR SURGERY ASSESSMENT & PLAN:   Ernest Parker is a 87 y.o. male who returns for 3 month follow-up evaluation. He was evaluated by Dr. Oneida Alar in June: Patient had bilateral ABIs performed today which were 0.7 bilaterally.  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   Stable peripheral arterial disease with worsening claudication symptoms.  Pain is equally bothersome in both calves with short distances. Slight decrease in ABIs as compared to 3 months ago.  Worsening estimation of left SFA stenosis. Chronic right SFA stenosis.   Rec: aortogram with BLE run-off, +/- intervention. History of mildly elevated serum creatinine. History of a. Fib on Eliquis.  Patient status post endovascular aneurysm repair elsewhere aneurysm diameter has decreased from 9.7 to 7.2 cm needs follow-up ultrasound in June 2023. SUBJECTIVE:   The patient complains of worsening bilateral lower extremity pain with minimal walking. Can no longer play golf. Pain resolves with rest. Denies rest pain . Denies skin loss or ulceration.  PHYSICAL EXAM:   Vitals:   05/12/21 1003  BP: (!) 187/74  Pulse: 60  Resp: 20  Temp: 97.9 F (36.6 C)  SpO2: 96%     General appearance: Well-developed, well-nourished in no apparent distress Neurologic: Alert and oriented x 4. Cardiovascular: Heart rate and rhythm are regular.   Extremities: Skin intact.  Both feet are warm and well perfused.  Motor function and sensation intact Pulse exam: 2+ femoral, absent pedal pulses bilaterally   NON-INVASIVE VASCULAR STUDIES   05/12/2021 ABIs ABI/TBIToday's ABIToday's TBIPrevious ABIPrevious TBI  +-------+-----------+-----------+------------+------------+  Right  0.66       0.43       0.77        0.48          +-------+-----------+-----------+------------+------------+  Left    0.65       0.47       0.74        0.51          +-------+-----------+-----------+------------+------------+   Bilateral ABIs appear decreased compared to prior study on 02/10/2021.     Summary:  Right: Resting right ankle-brachial index indicates moderate right lower  extremity arterial disease. The right toe-brachial index is abnormal.   Left: Resting left ankle-brachial index indicates moderate left lower  extremity arterial disease. The left toe-brachial index is abnormal.    *See table(s) above for measurements and observations.      Preliminary    LE arterial duplex: Summary:  Right: 30-49% stenosis involving the proximal deep femoral artery.  Minimal flow observed from the proximal to mid superficial femoral artery  segment.  No color or spectral Doppler flow observed in the distal posterior tibial  artery.   Left: 75-99% stenosis involving the distal superficial femoral artery.      See table(s) above for measurements and observations.     Preliminary     PROBLEM LIST:    The patient's past medical history, past surgical history, family history, social history, allergy list and medication list are reviewed. He is on Eliquis for atrial fibrillation.  He also has hypertension. He is not diabetic.  CURRENT MEDS:    Current Outpatient Medications:    apixaban (ELIQUIS) 2.5 MG TABS tablet, TAKE 1 TABLET BY MOUTH TWICE A DAY, Disp: 60 tablet, Rfl: 5   Cholecalciferol (VITAMIN D3) 2000 units TABS, Take 2,000 Units by mouth daily. , Disp: ,  Rfl:    diltiazem (CARDIZEM CD) 180 MG 24 hr capsule, TAKE 1 CAPSULE BY MOUTH EVERY DAY, Disp: 30 capsule, Rfl: 0   furosemide (LASIX) 40 MG tablet, Take 1 tablet (40 mg total) by mouth 2 (two) times daily., Disp: 180 tablet, Rfl: 3   tamsulosin (FLOMAX) 0.4 MG CAPS capsule, Take 1 capsule (0.4 mg total) by mouth daily after supper., Disp: 30 capsule, Rfl: 0   REVIEW OF SYSTEMS:   '[X]'$  denotes positive finding, '[ ]'$  denotes  negative finding Cardiac  Comments:  Chest pain or chest pressure:    Shortness of breath upon exertion:    Short of breath when lying flat:    Irregular heart rhythm: x       Vascular    Pain in calf, thigh, or hip brought on by ambulation: x   Pain in feet at night that wakes you up from your sleep:     Blood clot in your veins:    Leg swelling:         Pulmonary    Oxygen at home:    Productive cough:     Wheezing:         Neurologic    Sudden weakness in arms or legs:     Sudden numbness in arms or legs:     Sudden onset of difficulty speaking or slurred speech:    Temporary loss of vision in one eye:     Problems with dizziness:         Gastrointestinal    Blood in stool:     Vomited blood:         Genitourinary    Burning when urinating:     Blood in urine:        Psychiatric    Major depression:         Hematologic    Bleeding problems:    Problems with blood clotting too easily:        Skin    Rashes or ulcers:        Constitutional    Fever or chills:     Barbie Banner, PA-C  Office: [phone] 05/12/2021

## 2021-05-12 NOTE — H&P (View-Only) (Signed)
Peripheral Arterial Disease Follow-Up   VASCULAR SURGERY ASSESSMENT & PLAN:   Ernest Parker is a 87 y.o. male who returns for 3 month follow-up evaluation. He was evaluated by Dr. Oneida Alar in June: Patient had bilateral ABIs performed today which were 0.7 bilaterally.  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   Stable peripheral arterial disease with worsening claudication symptoms.  Pain is equally bothersome in both calves with short distances. Slight decrease in ABIs as compared to 3 months ago.  Worsening estimation of left SFA stenosis. Chronic right SFA stenosis.   Rec: aortogram with BLE run-off, +/- intervention. History of mildly elevated serum creatinine. History of a. Fib on Eliquis.  Patient status post endovascular aneurysm repair elsewhere aneurysm diameter has decreased from 9.7 to 7.2 cm needs follow-up ultrasound in June 2023. SUBJECTIVE:   The patient complains of worsening bilateral lower extremity pain with minimal walking. Can no longer play golf. Pain resolves with rest. Denies rest pain . Denies skin loss or ulceration.  PHYSICAL EXAM:   Vitals:   05/12/21 1003  BP: (!) 187/74  Pulse: 60  Resp: 20  Temp: 97.9 F (36.6 C)  SpO2: 96%     General appearance: Well-developed, well-nourished in no apparent distress Neurologic: Alert and oriented x 4. Cardiovascular: Heart rate and rhythm are regular.   Extremities: Skin intact.  Both feet are warm and well perfused.  Motor function and sensation intact Pulse exam: 2+ femoral, absent pedal pulses bilaterally   NON-INVASIVE VASCULAR STUDIES   05/12/2021 ABIs ABI/TBIToday's ABIToday's TBIPrevious ABIPrevious TBI  +-------+-----------+-----------+------------+------------+  Right  0.66       0.43       0.77        0.48          +-------+-----------+-----------+------------+------------+  Left    0.65       0.47       0.74        0.51          +-------+-----------+-----------+------------+------------+   Bilateral ABIs appear decreased compared to prior study on 02/10/2021.     Summary:  Right: Resting right ankle-brachial index indicates moderate right lower  extremity arterial disease. The right toe-brachial index is abnormal.   Left: Resting left ankle-brachial index indicates moderate left lower  extremity arterial disease. The left toe-brachial index is abnormal.    *See table(s) above for measurements and observations.      Preliminary    LE arterial duplex: Summary:  Right: 30-49% stenosis involving the proximal deep femoral artery.  Minimal flow observed from the proximal to mid superficial femoral artery  segment.  No color or spectral Doppler flow observed in the distal posterior tibial  artery.   Left: 75-99% stenosis involving the distal superficial femoral artery.      See table(s) above for measurements and observations.     Preliminary     PROBLEM LIST:    The patient's past medical history, past surgical history, family history, social history, allergy list and medication list are reviewed. He is on Eliquis for atrial fibrillation.  He also has hypertension. He is not diabetic.  CURRENT MEDS:    Current Outpatient Medications:    apixaban (ELIQUIS) 2.5 MG TABS tablet, TAKE 1 TABLET BY MOUTH TWICE A DAY, Disp: 60 tablet, Rfl: 5   Cholecalciferol (VITAMIN D3) 2000 units TABS, Take 2,000 Units by mouth daily. , Disp: ,  Rfl:    diltiazem (CARDIZEM CD) 180 MG 24 hr capsule, TAKE 1 CAPSULE BY MOUTH EVERY DAY, Disp: 30 capsule, Rfl: 0   furosemide (LASIX) 40 MG tablet, Take 1 tablet (40 mg total) by mouth 2 (two) times daily., Disp: 180 tablet, Rfl: 3   tamsulosin (FLOMAX) 0.4 MG CAPS capsule, Take 1 capsule (0.4 mg total) by mouth daily after supper., Disp: 30 capsule, Rfl: 0   REVIEW OF SYSTEMS:   '[X]'$  denotes positive finding, '[ ]'$  denotes  negative finding Cardiac  Comments:  Chest pain or chest pressure:    Shortness of breath upon exertion:    Short of breath when lying flat:    Irregular heart rhythm: x       Vascular    Pain in calf, thigh, or hip brought on by ambulation: x   Pain in feet at night that wakes you up from your sleep:     Blood clot in your veins:    Leg swelling:         Pulmonary    Oxygen at home:    Productive cough:     Wheezing:         Neurologic    Sudden weakness in arms or legs:     Sudden numbness in arms or legs:     Sudden onset of difficulty speaking or slurred speech:    Temporary loss of vision in one eye:     Problems with dizziness:         Gastrointestinal    Blood in stool:     Vomited blood:         Genitourinary    Burning when urinating:     Blood in urine:        Psychiatric    Major depression:         Hematologic    Bleeding problems:    Problems with blood clotting too easily:        Skin    Rashes or ulcers:        Constitutional    Fever or chills:     Barbie Banner, PA-C  Office: [phone] 05/12/2021

## 2021-05-17 ENCOUNTER — Observation Stay (HOSPITAL_COMMUNITY)
Admission: RE | Admit: 2021-05-17 | Discharge: 2021-05-18 | Disposition: A | Discharge status: Home / Self Care | Payer: Medicare Other | Source: Ambulatory Visit | Attending: Surgery | Admitting: Surgery

## 2021-05-17 ENCOUNTER — Other Ambulatory Visit: Payer: Self-pay

## 2021-05-17 ENCOUNTER — Encounter (HOSPITAL_COMMUNITY)
Admission: RE | Disposition: A | Discharge status: Home / Self Care | Payer: Self-pay | Source: Ambulatory Visit | Attending: Surgery

## 2021-05-17 ENCOUNTER — Encounter (HOSPITAL_COMMUNITY): Payer: Self-pay | Admitting: Surgery

## 2021-05-17 DIAGNOSIS — I70212 Atherosclerosis of native arteries of extremities with intermittent claudication, left leg: Secondary | ICD-10-CM | POA: Diagnosis not present

## 2021-05-17 DIAGNOSIS — I739 Peripheral vascular disease, unspecified: Secondary | ICD-10-CM | POA: Diagnosis present

## 2021-05-17 DIAGNOSIS — I10 Essential (primary) hypertension: Secondary | ICD-10-CM | POA: Insufficient documentation

## 2021-05-17 DIAGNOSIS — Z7901 Long term (current) use of anticoagulants: Secondary | ICD-10-CM | POA: Diagnosis not present

## 2021-05-17 DIAGNOSIS — Z79899 Other long term (current) drug therapy: Secondary | ICD-10-CM | POA: Diagnosis not present

## 2021-05-17 DIAGNOSIS — I4891 Unspecified atrial fibrillation: Secondary | ICD-10-CM | POA: Diagnosis not present

## 2021-05-17 DIAGNOSIS — Z9889 Other specified postprocedural states: Secondary | ICD-10-CM | POA: Diagnosis not present

## 2021-05-17 DIAGNOSIS — Z20822 Contact with and (suspected) exposure to covid-19: Secondary | ICD-10-CM | POA: Diagnosis not present

## 2021-05-17 DIAGNOSIS — I70213 Atherosclerosis of native arteries of extremities with intermittent claudication, bilateral legs: Secondary | ICD-10-CM | POA: Diagnosis not present

## 2021-05-17 HISTORY — PX: ABDOMINAL AORTOGRAM W/LOWER EXTREMITY: CATH118223

## 2021-05-17 HISTORY — PX: PERIPHERAL VASCULAR INTERVENTION: CATH118257

## 2021-05-17 LAB — POCT I-STAT, CHEM 8
BUN: 24 mg/dL — ABNORMAL HIGH (ref 8–23)
Calcium, Ion: 1.18 mmol/L (ref 1.15–1.40)
Chloride: 101 mmol/L (ref 98–111)
Creatinine, Ser: 1.8 mg/dL — ABNORMAL HIGH (ref 0.61–1.24)
Glucose, Bld: 132 mg/dL — ABNORMAL HIGH (ref 70–99)
HCT: 44 % (ref 39.0–52.0)
Hemoglobin: 15 g/dL (ref 13.0–17.0)
Potassium: 3.5 mmol/L (ref 3.5–5.1)
Sodium: 141 mmol/L (ref 135–145)
TCO2: 28 mmol/L (ref 22–32)

## 2021-05-17 LAB — SARS CORONAVIRUS 2 BY RT PCR (HOSPITAL ORDER, PERFORMED IN ~~LOC~~ HOSPITAL LAB): SARS Coronavirus 2: NEGATIVE

## 2021-05-17 SURGERY — ABDOMINAL AORTOGRAM W/LOWER EXTREMITY
Anesthesia: LOCAL | Laterality: Left

## 2021-05-17 MED ORDER — PROTAMINE SULFATE 10 MG/ML IV SOLN
INTRAVENOUS | Status: DC | PRN
Start: 2021-05-17 — End: 2021-05-17
  Administered 2021-05-17: 50 mg via INTRAVENOUS

## 2021-05-17 MED ORDER — ACETAMINOPHEN 325 MG PO TABS
650.0000 mg | ORAL_TABLET | ORAL | Status: DC | PRN
Start: 2021-05-17 — End: 2021-05-18

## 2021-05-17 MED ORDER — HEPARIN SODIUM (PORCINE) 1000 UNIT/ML IJ SOLN
INTRAMUSCULAR | Status: DC | PRN
  Administered 2021-05-17: 9000 [IU] via INTRAVENOUS

## 2021-05-17 MED ORDER — MIDAZOLAM HCL 2 MG/2ML IJ SOLN
INTRAMUSCULAR | Status: AC
  Filled 2021-05-17: qty 2

## 2021-05-17 MED ORDER — SODIUM CHLORIDE 0.9% FLUSH: 3.0000 mL | INTRAVENOUS | Status: DC | PRN

## 2021-05-17 MED ORDER — HEPARIN (PORCINE) IN NACL 1000-0.9 UT/500ML-% IV SOLN
INTRAVENOUS | Status: AC
  Filled 2021-05-17: qty 1000

## 2021-05-17 MED ORDER — FENTANYL CITRATE (PF) 100 MCG/2ML IJ SOLN
INTRAMUSCULAR | Status: AC
  Filled 2021-05-17: qty 2

## 2021-05-17 MED ORDER — IODIXANOL 320 MG/ML IV SOLN
INTRAVENOUS | Status: DC | PRN
  Administered 2021-05-17: 55 mL via INTRA_ARTERIAL

## 2021-05-17 MED ORDER — ASPIRIN EC 81 MG PO TBEC
81.0000 mg | DELAYED_RELEASE_TABLET | Freq: Every day | ORAL | Status: DC
  Administered 2021-05-18: 81 mg via ORAL
  Filled 2021-05-17: qty 1

## 2021-05-17 MED ORDER — ONDANSETRON HCL 4 MG/2ML IJ SOLN: 4.0000 mg | Freq: Four times a day (QID) | INTRAMUSCULAR | Status: DC | PRN

## 2021-05-17 MED ORDER — LABETALOL HCL 5 MG/ML IV SOLN: 10.0000 mg | INTRAVENOUS | Status: DC | PRN

## 2021-05-17 MED ORDER — HEPARIN (PORCINE) IN NACL 1000-0.9 UT/500ML-% IV SOLN
INTRAVENOUS | Status: DC | PRN
  Administered 2021-05-17 (×2): 500 mL

## 2021-05-17 MED ORDER — MIDAZOLAM HCL 2 MG/2ML IJ SOLN
INTRAMUSCULAR | Status: DC | PRN
Start: 2021-05-17 — End: 2021-05-17
  Administered 2021-05-17: 1 mg via INTRAVENOUS

## 2021-05-17 MED ORDER — PROTAMINE SULFATE 10 MG/ML IV SOLN
INTRAVENOUS | Status: DC | PRN
Start: 2021-05-17 — End: 2021-05-17
  Administered 2021-05-17: 45 mg via INTRAVENOUS
  Administered 2021-05-17: 5 mg via INTRAVENOUS

## 2021-05-17 MED ORDER — FENTANYL CITRATE (PF) 100 MCG/2ML IJ SOLN
INTRAMUSCULAR | Status: DC | PRN
  Administered 2021-05-17: 25 ug via INTRAVENOUS

## 2021-05-17 MED ORDER — SODIUM CHLORIDE 0.9 % IV SOLN: 250.0000 mL | INTRAVENOUS | Status: DC | PRN

## 2021-05-17 MED ORDER — PROTAMINE SULFATE 10 MG/ML IV SOLN
INTRAVENOUS | Status: AC
  Filled 2021-05-17: qty 5

## 2021-05-17 MED ORDER — HYDRALAZINE HCL 20 MG/ML IJ SOLN: 5.0000 mg | INTRAMUSCULAR | Status: DC | PRN

## 2021-05-17 MED ORDER — MORPHINE SULFATE (PF) 2 MG/ML IV SOLN
2.0000 mg | INTRAVENOUS | Status: DC | PRN
Start: 2021-05-17 — End: 2021-05-18

## 2021-05-17 MED ORDER — ROSUVASTATIN CALCIUM 5 MG PO TABS
10.0000 mg | ORAL_TABLET | Freq: Every day | ORAL | Status: DC
  Administered 2021-05-17 – 2021-05-18 (×2): 10 mg via ORAL
  Filled 2021-05-17 (×2): qty 2

## 2021-05-17 MED ORDER — SODIUM CHLORIDE 0.9 % IV SOLN: INTRAVENOUS | Status: DC

## 2021-05-17 MED ORDER — MIDAZOLAM HCL 2 MG/2ML IJ SOLN
INTRAMUSCULAR | Status: DC | PRN
  Administered 2021-05-17: 1 mg via INTRAVENOUS
  Administered 2021-05-17: 2 mg via INTRAVENOUS
  Administered 2021-05-17 (×2): 1 mg via INTRAVENOUS

## 2021-05-17 MED ORDER — SODIUM CHLORIDE 0.9% FLUSH: 3.0000 mL | Freq: Two times a day (BID) | INTRAVENOUS | Status: DC

## 2021-05-17 MED ORDER — LIDOCAINE HCL (PF) 1 % IJ SOLN
INTRAMUSCULAR | Status: DC | PRN
  Administered 2021-05-17 (×2): 20 mL via INTRADERMAL

## 2021-05-17 MED ORDER — LIDOCAINE HCL (PF) 1 % IJ SOLN
INTRAMUSCULAR | Status: AC
  Filled 2021-05-17: qty 30

## 2021-05-17 MED ORDER — SODIUM CHLORIDE 0.9 % WEIGHT BASED INFUSION
1.0000 mL/kg/h | INTRAVENOUS | Status: AC
  Administered 2021-05-17: 1 mL/kg/h via INTRAVENOUS

## 2021-05-17 MED ORDER — CHLORHEXIDINE GLUCONATE CLOTH 2 % EX PADS
6.0000 | MEDICATED_PAD | Freq: Every day | CUTANEOUS | Status: DC
  Administered 2021-05-17: 6 via TOPICAL

## 2021-05-17 MED ORDER — FENTANYL CITRATE (PF) 100 MCG/2ML IJ SOLN
INTRAMUSCULAR | Status: DC | PRN
  Administered 2021-05-17 (×2): 25 ug via INTRAVENOUS
  Administered 2021-05-17: 50 ug via INTRAVENOUS
  Administered 2021-05-17: 25 ug via INTRAVENOUS

## 2021-05-17 MED ORDER — VITAMIN D 25 MCG (1000 UNIT) PO TABS
2000.0000 [IU] | ORAL_TABLET | Freq: Every day | ORAL | Status: DC
  Administered 2021-05-18: 2000 [IU] via ORAL
  Filled 2021-05-17 (×2): qty 2

## 2021-05-17 MED ORDER — OXYCODONE HCL 5 MG PO TABS: 5.0000 mg | ORAL_TABLET | ORAL | Status: DC | PRN

## 2021-05-17 SURGICAL SUPPLY — 21 items
BALLN MUSTANG 6X120X135 (BALLOONS) ×3
BALLOON MUSTANG 6X120X135 (BALLOONS) ×2 IMPLANT
CATH OMNI FLUSH 5F 65CM (CATHETERS) ×3 IMPLANT
CATH SOFTOUCH MOTARJEME 5F (CATHETERS) ×3 IMPLANT
CLOSURE MYNX CONTROL 6F/7F (Vascular Products) ×3 IMPLANT
GLIDEWIRE NITREX 0.018X80X5 (WIRE) ×1
GUIDEWIRE NITREX 0.018X80X5 (WIRE) ×2 IMPLANT
KIT ANGIASSIST CO2 SYSTEM (KITS) ×3 IMPLANT
KIT ENCORE 26 ADVANTAGE (KITS) ×3 IMPLANT
KIT MICROPUNCTURE NIT STIFF (SHEATH) ×6 IMPLANT
KIT PV (KITS) ×3 IMPLANT
SHEATH PINNACLE 5F 10CM (SHEATH) ×6 IMPLANT
SHEATH PINNACLE 7F 10CM (SHEATH) ×6 IMPLANT
SHEATH PINNACLE ST 7F 45CM (SHEATH) ×3 IMPLANT
SHEATH PROBE COVER 6X72 (BAG) ×3 IMPLANT
STENT ELUVIA 7X150X130 (Permanent Stent) ×3 IMPLANT
STOPCOCK MORSE 400PSI 3WAY (MISCELLANEOUS) ×3 IMPLANT
TRANSDUCER W/STOPCOCK (MISCELLANEOUS) ×3 IMPLANT
TRAY PV CATH (CUSTOM PROCEDURE TRAY) ×3 IMPLANT
TUBING CIL FLEX 10 FLL-RA (TUBING) ×3 IMPLANT
WIRE STARTER BENTSON 035X150 (WIRE) ×3 IMPLANT

## 2021-05-17 NOTE — Op Note (Signed)
Patient name: Ernest Parker MRN: [id number] DOB: 05-26-1934 Sex: male  05/17/2021 Pre-operative Diagnosis: Bilateral claudication Post-operative diagnosis:  Same Surgeon:  Annamarie Major Procedure Performed:  1.  Ultrasound-guided access, right femoral artery  2.  Ultrasound-guided access, left femoral artery  3.  Abdominal aortogram  4.  Bilateral lower extremity runoff  5.  Stent, left superficial femoral artery  6.  Closure device, Celt to right groin and Mynx to left groin  7.  Conscious sedation, 121mnutes   Indications: This is an 87year old gentleman with severe bilateral claudication.  He has a history of a endovascular aneurysm repair.  He comes in today for angiography.  Procedure:  The patient was identified in the holding area and taken to room 8.  The patient was then placed supine on the table and prepped and draped in the usual sterile fashion.  A time out was called.  Conscious sedation was administered with the use of IV fentanyl and Versed under continuous physician and nurse monitoring.  Heart rate, blood pressure, and oxygen saturation were continuously monitored.  Total sedation time was 1315mutes.  Ultrasound was used to evaluate the right common femoral artery.  It was patent .  A digital ultrasound image was acquired.  A micropuncture needle was used to access the right common femoral artery under ultrasound guidance.  An 018 wire was advanced without resistance and a micropuncture sheath was placed.  The 018 wire was removed and a benson wire was placed.  The micropuncture sheath was exchanged for a 5 french sheath.  An omniflush catheter was advanced over the wire to the level of L-1.  An abdominal angiogram with CO2 was obtained.  Next, using a reverse curve catheter, the left leg was selected from the stent graft limb and left leg runoff was performed.  Retrograde injections were performed for right leg runoff.  A mixture of CO2 and contrast were utilized. Findings:    Aortogram: A endovascular infrarenal stent graft is visualized without evidence of endoleak.  The visualized right renal artery is patent without stenosis.  Bilateral common iliac arteries are widely patent.  Right Lower Extremity: The right common femoral and profundofemoral artery are patent throughout their course.  There is a flush occlusion superficial femoral artery.  There is reconstitution of the above-knee popliteal artery with single-vessel runoff via the peroneal artery  Left Lower Extremity: Left common femoral profundofemoral artery widely patent.  Superficial femoral artery is patent throughout its course there is a 80-90% stenosis within the superficial femoral and above-knee popliteal artery.  There is single-vessel runoff via the anterior tibial artery which does have disease at its origin.  Intervention: After the above images were acquired the decision made to proceed with intervention.  I elected to get antegrade access in the left groin.  The initial attempt was unsuccessful as he has a high profunda origin.  Therefore elected to cannulate directly the proximal superficial femoral artery.  This was done under ultrasound guidance with a micropuncture needle.  Ultimately a 7 French sheath was inserted.  The patient was fully heparinized.  Next a 035 wire was advanced across the stenosis and a 7 x 150 Elluvia stent was deployed across the lesion and postdilated with a 6 mm balloon.  Completion imaging revealed resolution of the stenosis with disease less than 10%.  There is no change in runoff.  I then attempted to close the groin with a Celt device.  I was unable to visualize this with ultrasound  and so I switched out to close it with a minx.  The right groin was closed with a Celt.  Patient did have a hematoma and proximal thigh that was controlled with manual pressure.  Impression:  #1  80% left superficial femoral artery stenosis successfully stented using a 7 x 150 Elluvia.   Single-vessel runoff via a diseased anterior tibial  #2  Flush occlusion of the right superficial femoral artery with reconstitution of the above-knee popliteal artery and single-vessel runoff via the peroneal artery.   Theotis Burrow, M.D., [facility] [place] Office: [phone] Pager:  [phone]

## 2021-05-17 NOTE — Progress Notes (Signed)
Pt received from cath lab. VSS. L groin w/ bruising, R groin level 0. Pt and family oriented to room and unit. Call light in reach.   Clyde Canterbury, RN

## 2021-05-17 NOTE — Interval H&P Note (Signed)
History and Physical Interval Note:  05/17/2021 9:39 AM  Ernest Parker  has presented today for surgery, with the diagnosis of PAD, claudication.  The various methods of treatment have been discussed with the patient and family. After consideration of risks, benefits and other options for treatment, the patient has consented to  Procedure(s): ABDOMINAL AORTOGRAM W/LOWER EXTREMITY (N/A) as a surgical intervention.  The patient's history has been reviewed, patient examined, no change in status, stable for surgery.  I have reviewed the patient's chart and labs.  Questions were answered to the patient's satisfaction.     Annamarie Major

## 2021-05-18 DIAGNOSIS — Z95820 Peripheral vascular angioplasty status with implants and grafts: Secondary | ICD-10-CM | POA: Diagnosis not present

## 2021-05-18 DIAGNOSIS — I10 Essential (primary) hypertension: Secondary | ICD-10-CM | POA: Diagnosis not present

## 2021-05-18 DIAGNOSIS — Z7901 Long term (current) use of anticoagulants: Secondary | ICD-10-CM | POA: Diagnosis not present

## 2021-05-18 DIAGNOSIS — Z79899 Other long term (current) drug therapy: Secondary | ICD-10-CM | POA: Diagnosis not present

## 2021-05-18 DIAGNOSIS — I4891 Unspecified atrial fibrillation: Secondary | ICD-10-CM | POA: Diagnosis not present

## 2021-05-18 DIAGNOSIS — Z20822 Contact with and (suspected) exposure to covid-19: Secondary | ICD-10-CM | POA: Diagnosis not present

## 2021-05-18 DIAGNOSIS — N179 Acute kidney failure, unspecified: Secondary | ICD-10-CM | POA: Diagnosis not present

## 2021-05-18 DIAGNOSIS — I70213 Atherosclerosis of native arteries of extremities with intermittent claudication, bilateral legs: Secondary | ICD-10-CM | POA: Diagnosis not present

## 2021-05-18 DIAGNOSIS — I70212 Atherosclerosis of native arteries of extremities with intermittent claudication, left leg: Secondary | ICD-10-CM | POA: Diagnosis not present

## 2021-05-18 DIAGNOSIS — D62 Acute posthemorrhagic anemia: Secondary | ICD-10-CM | POA: Diagnosis not present

## 2021-05-18 DIAGNOSIS — Z960 Presence of urogenital implants: Secondary | ICD-10-CM | POA: Diagnosis not present

## 2021-05-18 LAB — URINALYSIS, ROUTINE W REFLEX MICROSCOPIC
Bilirubin Urine: NEGATIVE
Glucose, UA: NEGATIVE mg/dL
Ketones, ur: NEGATIVE mg/dL
Nitrite: NEGATIVE
Protein, ur: 100 mg/dL — AB
Specific Gravity, Urine: 1.01 (ref 1.005–1.030)
pH: 8.5 — ABNORMAL HIGH (ref 5.0–8.0)

## 2021-05-18 LAB — CBC
HCT: 37.3 % — ABNORMAL LOW (ref 39.0–52.0)
Hemoglobin: 12.5 g/dL — ABNORMAL LOW (ref 13.0–17.0)
MCH: 31.3 pg (ref 26.0–34.0)
MCHC: 33.5 g/dL (ref 30.0–36.0)
MCV: 93.3 fL (ref 80.0–100.0)
Platelets: 169 10*3/uL (ref 150–400)
RBC: 4 MIL/uL — ABNORMAL LOW (ref 4.22–5.81)
RDW: 13.3 % (ref 11.5–15.5)
WBC: 9 10*3/uL (ref 4.0–10.5)
nRBC: 0 % (ref 0.0–0.2)

## 2021-05-18 LAB — BASIC METABOLIC PANEL
Anion gap: 8 (ref 5–15)
BUN: 23 mg/dL (ref 8–23)
CO2: 24 mmol/L (ref 22–32)
Calcium: 8.5 mg/dL — ABNORMAL LOW (ref 8.9–10.3)
Chloride: 106 mmol/L (ref 98–111)
Creatinine, Ser: 1.5 mg/dL — ABNORMAL HIGH (ref 0.61–1.24)
GFR, Estimated: 45 mL/min — ABNORMAL LOW (ref >60)
Glucose, Bld: 110 mg/dL — ABNORMAL HIGH (ref 70–99)
Potassium: 3.5 mmol/L (ref 3.5–5.1)
Sodium: 138 mmol/L (ref 135–145)

## 2021-05-18 LAB — URINALYSIS, MICROSCOPIC (REFLEX)
RBC / HPF: >50 RBC/hpf (ref 0–5)
Squamous Epithelial / HPF: NONE SEEN (ref 0–5)

## 2021-05-18 MED ORDER — ASPIRIN 81 MG PO TBEC: 81.0000 mg | DELAYED_RELEASE_TABLET | Freq: Every day | ORAL | Status: DC

## 2021-05-18 NOTE — Progress Notes (Signed)
Foley Catheter exchanged out per order with 20Fr foley with 10cc placed in balloon.  Patient tolerated well.  Urinalysis sent.

## 2021-05-18 NOTE — Plan of Care (Signed)
DISCHARGE NOTE HOME Ernest Parker to be discharged home per MD order. Discussed prescriptions and follow up appointments with the patient. Medication list explained in detail. Patient verbalized understanding.  Skin clean, dry and intact without evidence of skin break down, no evidence of skin tears noted. IV catheter discontinued intact. Site without signs and symptoms of complications. Dressing and pressure applied. Pt denies pain at the site currently. No complaints noted.  Patient free of lines, drains, and wounds.   An After Visit Summary (AVS) was printed and given to the patient. Patient escorted via wheelchair, and discharged home via private auto.  Stephan Minister, RN

## 2021-05-18 NOTE — Discharge Instructions (Signed)
° °  [facility] [place] ° °Discharge Instructions ° °Lower Extremity Angiogram; Angioplasty/Stenting ° °Please refer to the following instructions for your post-procedure care. Your surgeon or physician assistant will discuss any changes with you. ° °Activity ° °Avoid lifting more than 8 pounds (1 gallons of milk) for 72 hours (3 days) after your procedure. You may walk as much as you can tolerate. It's OK to drive after 72 hours. ° °Bathing/Showering ° °You may shower the day after your procedure. If you have a bandage, you may remove it at 24- 48 hours. Clean your incision site with mild soap and water. Pat the area dry with a clean towel. ° °Diet ° °Resume your pre-procedure diet. There are no special food restrictions following this procedure. All patients with peripheral vascular disease should follow a low fat/low cholesterol diet. In order to heal from your surgery, it is CRITICAL to get adequate nutrition. Your body requires vitamins, minerals, and protein. Vegetables are the best source of vitamins and minerals. Vegetables also provide the perfect balance of protein. Processed food has little nutritional value, so try to avoid this. ° °Medications ° °Resume taking all of your medications unless your doctor tells you not to. If your incision is causing pain, you may take over-the-counter pain relievers such as acetaminophen (Tylenol) ° °Follow Up ° °Follow up will be arranged at the time of your procedure. You may have an office visit scheduled or may be scheduled for surgery. Ask your surgeon if you have any questions. ° °Please call us immediately for any of the following conditions: °•Severe or worsening pain your legs or feet at rest or with walking. °•Increased pain, redness, drainage at your groin puncture site. °•Fever of 101 degrees or higher. °•If you have any mild or slow bleeding from your puncture site: lie down, apply firm constant pressure over the area with a piece of  gauze or a clean wash cloth for 30 minutes- no peeking!, call 911 right away if you are still bleeding after 30 minutes, or if the bleeding is heavy and unmanageable. ° °Reduce your risk factors of vascular disease: ° °Stop smoking. If you would like help call [facility] at 1-800-QUIT-NOW (1-800-784-8669) or [place] at [phone]. °Manage your cholesterol °Maintain a desired weight °Control your diabetes °Keep your blood pressure down ° °If you have any questions, please call the office at [phone] ° °

## 2021-05-18 NOTE — Progress Notes (Addendum)
Progress Note    05/18/2021 7:09 AM 1 Day Post-Op  Subjective:  wants to go home.  Wants his catheter changed  Afebrile HR 60's-110's [id number] systolic [id number] RA  Vitals:   05/17/21 2300 05/18/21 0350  BP: (!) 147/66 (!) 169/90  Pulse: 77 63  Resp: 16 20  Temp: 98 F (36.7 C) 97.6 F (36.4 C)  SpO2: 96% 94%    Physical Exam: Cardiac:  regular Lungs:  non labored Incisions:  bilateral groins are soft; left groin/thigh with ecchymosis Extremities:  +doppler signals bilateral DP/PT/peroneal   CBC    Component Value Date/Time   WBC 9.0 05/18/2021 0058   RBC 4.00 (L) 05/18/2021 0058   HGB 12.5 (L) 05/18/2021 0058   HGB 15.6 07/12/2020 1405   HCT 37.3 (L) 05/18/2021 0058   HCT 44.8 07/12/2020 1405   PLT 169 05/18/2021 0058   PLT 245 07/12/2020 1405   MCV 93.3 05/18/2021 0058   MCV 91 07/12/2020 1405   MCH 31.3 05/18/2021 0058   MCHC 33.5 05/18/2021 0058   RDW 13.3 05/18/2021 0058   RDW 13.2 07/12/2020 1405   LYMPHSABS 1.9 07/12/2020 1405   MONOABS 0.6 08/13/2018 1501   EOSABS 0.5 (H) 07/12/2020 1405   BASOSABS 0.1 07/12/2020 1405    BMET    Component Value Date/Time   NA 138 05/18/2021 0058   NA 139 12/17/2020 0905   K 3.5 05/18/2021 0058   CL 106 05/18/2021 0058   CO2 24 05/18/2021 0058   GLUCOSE 110 (H) 05/18/2021 0058   BUN 23 05/18/2021 0058   BUN 18 12/17/2020 0905   CREATININE 1.50 (H) 05/18/2021 0058   CALCIUM 8.5 (L) 05/18/2021 0058   GFRNONAA 45 (L) 05/18/2021 0058   GFRAA 49 (L) 03/06/2019 0904    INR    Component Value Date/Time   INR 1.0 03/06/2019 0904     Intake/Output Summary (Last 24 hours) at 05/18/2021 0709 Last data filed at 05/18/2021 0539 Gross per 24 hour  Intake 1370.77 ml  Output 450 ml  Net 920.77 ml     Assessment/Plan:  87 y.o. male is s/p:  Aortogram with BLE runoff, bilateral femoral artery access, stent of left SFA  1 Day Post-Op   -pt with +doppler signals bilateral DP/PT/pero.  Bilateral groins soft.   Left groin with some ecchymosis.   -pt with chronic indwelling catheter and followed by Dr. Baron Sane at [facility]-he is having pain around his catheter and requesting it to be changed.  Will ask [facility] to see pt.  -creatinine improved this am to 1.5 from 1.8 -acute blood loss anemia with hgb from 15 to 12.5-pt tolerating -home later this morning after he has mobilized. -f/u with [facility] in 4-6 weeks with ABI and LLE arterial duplex.   Leontine Locket, PA-C [facility] [phone] 05/18/2021 7:09 AM  Addendum:  spoke with Dr. Diona Fanti with [facility] regarding foley catheter.  Asked for RN to change catheter with same size he has in.  Feels his pain most likely related to bladder spasms.  If pain is not improved with changing catheter, can start Oxybutynin.   He can follow up with them at his next scheduled appt or call if needed before then.  Spoke with Dr. Ardelle Park will be on Eliquis and asa-not on Plavix.    Leontine Locket, [facility] 05/18/2021 8:25 AM  I agree with the above.  I have seen and evaluate the patient.  He has had no propagation of the hematoma  in the left thigh.  Acute blood loss anemia: There is been a mild decrease in his hemoglobin.  He did not require transfusion. Acute renal insufficiency: There is a slight bump in his creatinine but he is basically at baseline.  He was hydrated overnight.  Foley catheter: We will contact urology to determine how to best deal with the pain he is having from his chronic indwelling Foley.  I expect he can go home today  [employer]

## 2021-05-18 NOTE — Consult Note (Signed)
Patient well known to our practice.   He has remote history of treated prostate cancer with no evidence of the.  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  He is catheter dependent.    At times he has had poor drainage of his bladder with his catheter.  It was last changed in our office on the 19th of August.    Vascular surgery consult this morning for catheter change.  He is having some pain.  Perhaps his catheter is not draining well.    If after catheter change he is still having bladder pain, I would recommend short treatment with oxybutynin 5 mg every 8 hours as needed for spasms.  At this point, I would recommend catheter change with either an 18 or 20 French  Foley catheter.  If difficulty placing, please contact in patient rehab, as they are the catheter team.  He has an appointment to see [place] in 2 weeks.

## 2021-05-18 NOTE — Plan of Care (Signed)
  Problem: Education: Goal: Knowledge of General Education information will improve Description: Including pain rating scale, medication(s)/side effects and non-pharmacologic comfort measures Outcome: Completed/Met

## 2021-05-18 NOTE — Discharge Summary (Signed)
Discharge Summary    Ernest Parker [birth year] 87 y.o. male  [id number]  Admission Date: 05/17/2021  Discharge Date: 05/22/2021  Physician: No att. providers found  Admission Diagnosis: PAD (peripheral artery disease) ([place]) [I73.9]   HPI:   This is a 87 y.o. male with severe bilateral claudication.  He has a history of a endovascular aneurysm repair.  He comes in today for angiography.  Hospital Course:  The patient was admitted to the hospital and taken to the [facility] lab on 05/17/2021 and underwent:            1.  Ultrasound-guided access, right femoral artery            2.  Ultrasound-guided access, left femoral artery            3.  Abdominal aortogram            4.  Bilateral lower extremity runoff            5.  Stent, left superficial femoral artery            6.  Closure device, Celt to right groin and Mynx to left groin            7.  Conscious sedation, 140mnutes    Findings as follows:   Aortogram: A endovascular infrarenal stent graft is visualized without evidence of endoleak.  The visualized right renal artery is patent without stenosis.  Bilateral common iliac arteries are widely patent.            Right Lower Extremity: The right common femoral and profundofemoral artery are patent throughout their course.  There is a flush occlusion superficial femoral artery.  There is reconstitution of the above-knee popliteal artery with single-vessel runoff via the peroneal artery             Left Lower Extremity: Left common femoral profundofemoral artery widely patent.  Superficial femoral artery is patent throughout its course there is a 80-90% stenosis within the superficial femoral and above-knee popliteal artery.  There is single-vessel runoff via the anterior tibial artery which does have disease at its origin.  The pt tolerated the procedure well and was transported to the PACU in good condition.   By POD 1, pt doing well with bilateral doppler signals DP/PT/peroneal.  He  did have some issues with his indwelling catheter.  Spoke with Dr. DDiona Fantiand he asked for RN to change catheter with same size that is present.    Pt was started on asa in addition to his Eliquis.   Per Dr. BTrula Slade Acute blood loss anemia: There is been a mild decrease in his hemoglobin.  He did not require transfusion. Acute renal insufficiency: There is a slight bump in his creatinine but he is basically at baseline.  He was hydrated overnight.   Per Dr. DDiona Fanti Patient well known to our practice.   He has remote history of treated prostate cancer with no evidence of the.  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  He is catheter dependent.     At times he has had poor drainage of his bladder with his catheter.  It was last changed in our office on the 19th of August.     Vascular surgery consult this morning for catheter change.  He is having some pain.  Perhaps his catheter is not draining well.     If after catheter change he is still  having bladder pain, I would recommend short treatment with oxybutynin 5 mg every 8 hours as needed for spasms.   At this point, I would recommend catheter change with either an 18 or 20 French  Foley catheter.  If difficulty placing, please contact in patient rehab, as they are the catheter team.   He has an appointment to see [place] in 2 weeks.          CBC    Component Value Date/Time   WBC 9.0 05/18/2021 0058   RBC 4.00 (L) 05/18/2021 0058   HGB 12.5 (L) 05/18/2021 0058   HGB 15.6 07/12/2020 1405   HCT 37.3 (L) 05/18/2021 0058   HCT 44.8 07/12/2020 1405   PLT 169 05/18/2021 0058   PLT 245 07/12/2020 1405   MCV 93.3 05/18/2021 0058   MCV 91 07/12/2020 1405   MCH 31.3 05/18/2021 0058   MCHC 33.5 05/18/2021 0058   RDW 13.3 05/18/2021 0058   RDW 13.2 07/12/2020 1405   LYMPHSABS 1.9 07/12/2020 1405   MONOABS 0.6 08/13/2018 1501   EOSABS 0.5 (H) 07/12/2020 1405   BASOSABS 0.1 07/12/2020  1405    BMET    Component Value Date/Time   NA 138 05/18/2021 0058   NA 139 12/17/2020 0905   K 3.5 05/18/2021 0058   CL 106 05/18/2021 0058   CO2 24 05/18/2021 0058   GLUCOSE 110 (H) 05/18/2021 0058   BUN 23 05/18/2021 0058   BUN 18 12/17/2020 0905   CREATININE 1.50 (H) 05/18/2021 0058   CALCIUM 8.5 (L) 05/18/2021 0058   GFRNONAA 45 (L) 05/18/2021 0058   GFRAA 49 (L) 03/06/2019 0904      Discharge Instructions     Discharge patient   Complete by: As directed    Discharge home after foley changed and pt has ambulated and foley discomfort improved.   Discharge disposition: 01-Home or Self Care   Discharge patient date: 05/18/2021       Discharge Diagnosis:  PAD (peripheral artery disease) ([place]) [I73.9]  Secondary Diagnosis: Patient Active Problem List   Diagnosis Date Noted   PAD (peripheral artery disease) ([place]) 05/17/2021   Pacemaker 05/03/2020   Urinary retention 03/10/2019   Cholecystitis    Gall stones, common bile duct    Mallory-Weiss syndrome    Cirrhosis of liver without ascites (HCC)    E coli bacteremia    PAF (paroxysmal atrial fibrillation) (HCC) 07/04/2018   Elevated troponin 07/04/2018   Syncope and collapse 07/01/2018   Abnormal LFTs 07/01/2018   CKD (chronic kidney disease) stage 3, GFR 30-59 ml/min (HCC) 07/01/2018   Sinus node dysfunction ([place]) 12/17/2017   TIA (transient ischemic attack) 11/12/2017   History of prostate cancer 11/12/2017   Atrial fibrillation with rapid ventricular response ([place])    Essential hypertension    Past Medical History:  Diagnosis Date   A-fib ([place])    Barrett's esophagus    Dizzy resolved   Dyspnea    with exertion occ   GERD (gastroesophageal reflux disease)    Hard of hearing    wears hearing aids bilat   High cholesterol    Hypertension    Presence of permanent cardiac pacemaker 12/17/2017   Prostate cancer ([place]) 2002   S/P "radiation for 8-9 weeks"   SOB (shortness of breath)      Allergies  as of 05/18/2021       Reactions   Penicillins Other (See Comments)   Patient can't remember reaction was told by mother he  had a SERIOUS allergic rxn as a child  DID THE REACTION INVOLVE: Swelling of the face/tongue/throat, SOB, or low BP? Unknown Sudden or severe rash/hives, skin peeling, or the inside of the mouth or nose? Unknown Did it require medical treatment? Unknown When did it last happen?      Childhood allergy  If all above answers are "NO", may proceed with cephalosporin use.        Medication List     TAKE these medications    aspirin 81 MG EC tablet Take 1 tablet (81 mg total) by mouth daily. Swallow whole.   diltiazem 180 MG 24 hr capsule Commonly known as: CARDIZEM CD TAKE 1 CAPSULE BY MOUTH EVERY DAY   Eliquis 2.5 MG Tabs tablet Generic drug: apixaban TAKE 1 TABLET BY MOUTH TWICE A DAY   furosemide 40 MG tablet Commonly known as: LASIX Take 1 tablet (40 mg total) by mouth 2 (two) times daily.   REFRESH OP Place 1 drop into both eyes 2 (two) times daily as needed (dry eyes).   Vitamin D3 50 MCG (2000 UT) Tabs Take 2,000 Units by mouth daily.          Instructions:  [facility]  Discharge Instructions  Lower Extremity Angiogram; Angioplasty/Stenting  Please refer to the following instructions for your post-procedure care. Your surgeon or physician assistant will discuss any changes with you.  Activity  Avoid lifting more than 8 pounds (1 gallons of milk) for 72 hours (3 days) after your procedure. You may walk as much as you can tolerate. It's OK to drive after 72 hours.  Bathing/Showering  You may shower the day after your procedure. If you have a bandage, you may remove it at 24- 48 hours. Clean your incision site with mild soap and water. Pat the area dry with a clean towel.  Diet  Resume your pre-procedure diet. There are no special food restrictions following this procedure. All patients with  peripheral vascular disease should follow a low fat/low cholesterol diet. In order to heal from your surgery, it is CRITICAL to get adequate nutrition. Your body requires vitamins, minerals, and protein. Vegetables are the best source of vitamins and minerals. Vegetables also provide the perfect balance of protein. Processed food has little nutritional value, so try to avoid this.  Medications  Resume taking all of your medications unless your doctor tells you not to. If your incision is causing pain, you may take over-the-counter pain relievers such as acetaminophen (Tylenol)  Follow Up  Follow up will be arranged at the time of your procedure. You may have an office visit scheduled or may be scheduled for surgery. Ask your surgeon if you have any questions.  Please call us immediately for any of the following conditions: Severe or worsening pain your legs or feet at rest or with walking. Increased pain, redness, drainage at your groin puncture site. Fever of 101 degrees or higher. If you have any mild or slow bleeding from your puncture site: lie down, apply firm constant pressure over the area with a piece of gauze or a clean wash cloth for 30 minutes- no peeking!, call 911 right away if you are still bleeding after 30 minutes, or if the bleeding is heavy and unmanageable.  Reduce your risk factors of vascular disease:  Stop smoking. If you would like help call [facility] at 1-800-QUIT-NOW 850 368 1117) or [place] at ([phone]. Manage your cholesterol Maintain a desired weight Control your diabetes Keep your  blood pressure down  If you have any questions, please call the office at [phone]  Prescriptions given:  Aspirin '81mg'$  daily   Disposition: home  Patient's condition: is Good  Follow up: 1. [facility] in 4-6 weeks with ABI and LLE arterial duplex 2. [facility] at next scheduled appt or sooner if any issues.   Leontine Locket, PA-C [facility] [phone] 05/22/2021  9:05 AM

## 2021-05-18 NOTE — Progress Notes (Signed)
Pt complain of pain from chronic foley. Charge nurse and  rapid response nurse. Bladder scan 0%. Notified provider about foley and L leg hematoma. Dr. Trula Slade called back stated he will speak to urology in the am about the foley.

## 2021-05-19 ENCOUNTER — Other Ambulatory Visit: Payer: Self-pay | Admitting: Physician Assistant

## 2021-05-19 MED ORDER — SULFAMETHOXAZOLE-TRIMETHOPRIM 400-80 MG PO TABS: 1.0000 | ORAL_TABLET | Freq: Two times a day (BID) | ORAL | 0 refills | Status: DC

## 2021-05-19 NOTE — Progress Notes (Signed)
Pt's u/a came back with many bacteria & moderate leukocytes.    Discussed with Dr. Trula Slade and bactrim DS one po bid x 5 days was sent to his pharmacy.   I spoke with pt on the phone and he is feeling much better since foley changed.  He will keep his f/u appt with [facility] in a couple of weeks.   He has f/u with [facility] in mid October.   Kyleah P[place]ene, PAC .td 9:45 AM

## 2021-05-22 ENCOUNTER — Other Ambulatory Visit: Payer: Self-pay | Admitting: Internal Medicine

## 2021-05-27 ENCOUNTER — Telehealth: Payer: Self-pay

## 2021-05-27 NOTE — Telephone Encounter (Signed)
[facility] alert for HVR EGM shows AF with RVR, duration 25mn, mean HR 168 Known PAR, historically poor rate control, burden stable 8%, recent UTI per EPIC Eliquis, Diltiazem, Metoprolol prescribed Route to triage for symptom evaluation LR  Transmission revieved. AF with RVR 05/26/21 at 9am. Successful telephone encounter to patient who states [place] the [place] that [place]. Symptoms of mild dizziness and shortness of breath. Symptoms have resolved today. Medications reviewed. Patient acknowledges compliance with eliquis and cardizem. Patient declines AF clinic at this time. He is advised of ED precautions. Will route to Dr. TLovena Lefor review and advisement. Patient due for ROV 05/02/21. Recall sent.

## 2021-06-01 DIAGNOSIS — N312 Flaccid neuropathic bladder, not elsewhere classified: Secondary | ICD-10-CM | POA: Diagnosis not present

## 2021-06-01 DIAGNOSIS — R338 Other retention of urine: Secondary | ICD-10-CM | POA: Diagnosis not present

## 2021-06-14 ENCOUNTER — Other Ambulatory Visit: Payer: Self-pay | Admitting: *Deleted

## 2021-06-14 DIAGNOSIS — I739 Peripheral vascular disease, unspecified: Secondary | ICD-10-CM

## 2021-06-23 ENCOUNTER — Other Ambulatory Visit: Payer: Self-pay | Admitting: Internal Medicine

## 2021-06-27 ENCOUNTER — Encounter (HOSPITAL_COMMUNITY): Payer: Medicare Other

## 2021-06-27 ENCOUNTER — Encounter: Payer: Medicare Other | Admitting: Surgery

## 2021-06-27 MED ORDER — DILTIAZEM HCL ER COATED BEADS 180 MG PO CP24: 180.0000 mg | ORAL_CAPSULE | Freq: Every day | ORAL | 0 refills | Status: DC

## 2021-06-29 DIAGNOSIS — R338 Other retention of urine: Secondary | ICD-10-CM | POA: Diagnosis not present

## 2021-07-06 DIAGNOSIS — Z23 Encounter for immunization: Secondary | ICD-10-CM | POA: Diagnosis not present

## 2021-07-07 ENCOUNTER — Ambulatory Visit (INDEPENDENT_AMBULATORY_CARE_PROVIDER_SITE_OTHER): Payer: Medicare Other

## 2021-07-07 DIAGNOSIS — I495 Sick sinus syndrome: Secondary | ICD-10-CM | POA: Diagnosis not present

## 2021-07-07 LAB — CUP PACEART REMOTE DEVICE CHECK
Battery Remaining Longevity: 75 mo
Battery Remaining Percentage: 68 %
Battery Voltage: 2.99 V
Brady Statistic AP VP Percent: 1.7 %
Brady Statistic AP VS Percent: 70 %
Brady Statistic AS VP Percent: 1.2 %
Brady Statistic AS VS Percent: 26 %
Brady Statistic RA Percent Paced: 61 %
Brady Statistic RV Percent Paced: 4.5 %
Date Time Interrogation Session: 20221027020015
Implantable Lead Implant Date: 20190408
Implantable Lead Implant Date: 20190408
Implantable Lead Location: 753859
Implantable Lead Location: 753860
Implantable Pulse Generator Implant Date: 20190408
Lead Channel Impedance Value: 400 Ohm
Lead Channel Impedance Value: 610 Ohm
Lead Channel Pacing Threshold Amplitude: 1 V
Lead Channel Pacing Threshold Amplitude: 2 V
Lead Channel Pacing Threshold Pulse Width: 0.5 ms
Lead Channel Pacing Threshold Pulse Width: 0.5 ms
Lead Channel Sensing Intrinsic Amplitude: 11.3 mV
Lead Channel Sensing Intrinsic Amplitude: 2.8 mV
Lead Channel Setting Pacing Amplitude: 2.5 V
Lead Channel Setting Pacing Amplitude: 3.5 V
Lead Channel Setting Pacing Pulse Width: 0.5 ms
Lead Channel Setting Sensing Sensitivity: 2 mV
Pulse Gen Model: 2272
Pulse Gen Serial Number: 9004195

## 2021-07-11 ENCOUNTER — Ambulatory Visit (INDEPENDENT_AMBULATORY_CARE_PROVIDER_SITE_OTHER)
Admission: RE | Admit: 2021-07-11 | Discharge: 2021-07-11 | Disposition: A | Discharge status: Home / Self Care | Payer: Medicare Other | Source: Ambulatory Visit | Attending: Physician Assistant | Admitting: Physician Assistant

## 2021-07-11 ENCOUNTER — Ambulatory Visit (INDEPENDENT_AMBULATORY_CARE_PROVIDER_SITE_OTHER): Payer: Medicare Other | Admitting: Surgery

## 2021-07-11 ENCOUNTER — Encounter: Payer: Self-pay | Admitting: Surgery

## 2021-07-11 ENCOUNTER — Ambulatory Visit (HOSPITAL_COMMUNITY)
Admission: RE | Admit: 2021-07-11 | Discharge: 2021-07-11 | Disposition: A | Discharge status: Home / Self Care | Payer: Medicare Other | Source: Ambulatory Visit | Attending: Physician Assistant | Admitting: Physician Assistant

## 2021-07-11 ENCOUNTER — Other Ambulatory Visit: Payer: Self-pay

## 2021-07-11 VITALS — BP 162/87 | HR 59 | Temp 97.8°F | Resp 20 | Ht 70.0 in | Wt 215.0 lb

## 2021-07-11 DIAGNOSIS — I739 Peripheral vascular disease, unspecified: Secondary | ICD-10-CM

## 2021-07-11 DIAGNOSIS — I70213 Atherosclerosis of native arteries of extremities with intermittent claudication, bilateral legs: Secondary | ICD-10-CM

## 2021-07-11 NOTE — Progress Notes (Signed)
[facility] [place]  Patient name: Ernest Parker MRN: [id number] DOB: [birth year] Sex: male   REASON FOR VISIT:    Follow up  HISOTRY OF PRESENT ILLNESS:    Ernest Parker is a 87 y.o. male who was initially evaluated by Dr. Oneida Alar in June 2022 for claudication.  At that time he had ABIs of 0.7 bilaterally.  His symptoms worsened to where he developed cramping with minimal walking.  On 05/17/2021 he underwent angiography.  He was found to have an 80% left superficial femoral artery stenosis which was successfully stented.  He had a flush occlusion of the right superficial femoral artery.  He is back today to discuss surgical options.  He is back today stating that his symptoms are nearly resolved on both legs  The patient has a history of endovascular aneurysm repair performed at a different institution.  Aortic diameter has decreased from 9.7 down to 7.2 cm.  His next surveillance should be in June 2023.  He is on Eliquis for atrial fibrillation.  He is a former smoker   PAST MEDICAL HISTORY:   Past Medical History:  Diagnosis Date   A-fib ([place])    Barrett's esophagus    Dizzy resolved   Dyspnea    with exertion occ   GERD (gastroesophageal reflux disease)    Hard of hearing    wears hearing aids bilat   High cholesterol    Hypertension    Presence of permanent cardiac pacemaker 12/17/2017   Prostate cancer ([place]) 2002   S/P "radiation for 8-9 weeks"   SOB (shortness of breath)      FAMILY HISTORY:   Family History  Problem Relation Age of Onset   Hypertension Mother    Tuberculosis Father    Kidney disease Sister    Sleep apnea Sister     SOCIAL HISTORY:   Social History   Tobacco Use   Smoking status: Former    Packs/day: 0.50    Years: 30.00    Pack years: 15.00    Types: Cigarettes    Quit date: 1980    Years since quitting: 42.8   Smokeless tobacco: Never  Substance Use Topics   Alcohol use: Not  Currently    Alcohol/week: 1.0 standard drink    Types: 1 Cans of beer per week     ALLERGIES:   Allergies  Allergen Reactions   Penicillins Other (See Comments)    Patient can't remember reaction was told by mother he had a SERIOUS allergic rxn as a child  DID THE REACTION INVOLVE: Swelling of the face/tongue/throat, SOB, or low BP? Unknown Sudden or severe rash/hives, skin peeling, or the inside of the mouth or nose? Unknown Did it require medical treatment? Unknown When did it last happen?      Childhood allergy  If all above answers are "NO", may proceed with cephalosporin use.      CURRENT MEDICATIONS:   Current Outpatient Medications  Medication Sig Dispense Refill   apixaban (ELIQUIS) 2.5 MG TABS tablet TAKE 1 TABLET BY MOUTH TWICE A DAY 60 tablet 5   aspirin EC 81 MG EC tablet Take 1 tablet (81 mg total) by mouth daily. Swallow whole.     Cholecalciferol (VITAMIN D3) 2000 units TABS Take 2,000 Units by mouth daily.      diltiazem (CARDIZEM CD) 180 MG 24 hr capsule Take 1 capsule (180 mg total) by mouth daily. Please keep upcoming appointment for further refills. 90 capsule  0   furosemide (LASIX) 40 MG tablet Take 1 tablet (40 mg total) by mouth 2 (two) times daily. 180 tablet 3   Polyvinyl Alcohol-Povidone (REFRESH OP) Place 1 drop into both eyes 2 (two) times daily as needed (dry eyes).     sulfamethoxazole-trimethoprim (BACTRIM) 400-80 MG tablet Take 1 tablet by mouth 2 (two) times daily. 10 tablet 0   No current facility-administered medications for this visit.    REVIEW OF SYSTEMS:   [X]  denotes positive finding, [ ]  denotes negative finding Cardiac  Comments:  Chest pain or chest pressure:    Shortness of breath upon exertion:    Short of breath when lying flat:    Irregular heart rhythm:        Vascular    Pain in calf, thigh, or hip brought on by ambulation:    Pain in feet at night that wakes you up from your sleep:     Blood clot in your veins:    Leg  swelling:         Pulmonary    Oxygen at home:    Productive cough:     Wheezing:         Neurologic    Sudden weakness in arms or legs:     Sudden numbness in arms or legs:     Sudden onset of difficulty speaking or slurred speech:    Temporary loss of vision in one eye:     Problems with dizziness:         Gastrointestinal    Blood in stool:     Vomited blood:         Genitourinary    Burning when urinating:     Blood in urine:        Psychiatric    Major depression:         Hematologic    Bleeding problems:    Problems with blood clotting too easily:        Skin    Rashes or ulcers:        Constitutional    Fever or chills:      PHYSICAL EXAM:   There were no vitals filed for this visit.  GENERAL: The patient is a well-nourished male, in no acute distress. The vital signs are documented above. CARDIAC: There is a regular rate and rhythm.  VASCULAR: Nonpalpable pedal pulses PULMONARY: Non-labored respirations MUSCULOSKELETAL: There are no major deformities or cyanosis. NEUROLOGIC: No focal weakness or paresthesias are detected. SKIN: There are no ulcers or rashes noted. PSYCHIATRIC: The patient has a normal affect.  STUDIES:   I have reviewed the following duplex studies: Left leg duplex: Left: Patent stent with no evidence of stenosis in the Distal left SFA  artery.   +-------+-----------+-----------+------------+------------+  ABI/TBIToday's ABIToday's TBIPrevious ABIPrevious TBI  +-------+-----------+-----------+------------+------------+  Right  0.69       0.53       0.66        0.43          +-------+-----------+-----------+------------+------------+  Left   0.90       0.84       0.65        0.47          +-------+-----------+-----------+------------+------------+   Vein mapping:  +-------------+-----------+----------------------+-------------+-----------  ----+   RT Diameter RT Findings         GSV           LT Diameter    LT  Findings        (  cm)                                          (cm)                       +-------------+-----------+----------------------+-------------+-----------  ----+      0.61                    Saphenofemoral        0.54                                                      Junction                                      +-------------+-----------+----------------------+-------------+-----------  ----+      0.46                    Proximal thigh        0.35                       +-------------+-----------+----------------------+-------------+-----------  ----+      0.46                      Mid thigh           0.38                       +-------------+-----------+----------------------+-------------+-----------  ----+      0.47                     Distal thigh         0.20        Distal  to                                                                    superficial                                                                   branch        +-------------+-----------+----------------------+-------------+-----------  ----+      0.32                         Knee             0.31                       +-------------+-----------+----------------------+-------------+-----------  ----+      0.40  Prox calf           0.31                       +-------------+-----------+----------------------+-------------+-----------  ----+      0.38                       Mid calf           0.30                       +-------------+-----------+----------------------+-------------+-----------  ----+      0.25                     Distal calf          0.23                       +-------------+-----------+----------------------+-------------+-----------  ----+                                  Ankle             0.19                        +-------------+-----------+----------------------+-------------+-----------  ----+  MEDICAL ISSUES:   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  I do not think his symptoms are severe enough to warrant additional intervention at this time.  I discussed that we would bring him back in 3 months for surveillance ultrasound.  He will contact me should he have any change in his symptoms.  He is now only taking Eliquis and has stopped his aspirin.  He has refused a statin in the past but will talk to his primary care doctor about starting one    Annamarie Major, IV, MD, FACS [facility] [phone] Pager [phone]

## 2021-07-13 ENCOUNTER — Other Ambulatory Visit: Payer: Self-pay

## 2021-07-13 DIAGNOSIS — I70213 Atherosclerosis of native arteries of extremities with intermittent claudication, bilateral legs: Secondary | ICD-10-CM

## 2021-07-14 NOTE — Progress Notes (Signed)
Remote pacemaker transmission.   

## 2021-07-14 NOTE — Progress Notes (Signed)
Cardiology Office Note Date:  07/15/2021  Patient ID:  Ernest, Parker 11-Aug-1934, MRN [id number] PCP:  Seward Carol, MD  Electrophysiologist: Dr. Lovena Le     Chief Complaint:  annual visit  History of Present Illness: Ernest Parker is a 87 y.o. male with history of sinus node dysfunction w/PPM, HTN, HLD, AFib, thoracic aortic aneurysm repair, stent graft 2019 at [facility]), PVD (follows with [facility])  He comes in today to be seen for Dr. Lovena Le, last seen by him Aug 2021, c/o fatigue, chronic DOE.  His BB was reduced to try and alleviate his fatigue.  He follows his hx of aortic aneurysm repair and PVD with [facility], dr. Trula Slade.  He underwent stent placement L superficial femoral artery 05/17/21   TODAY He feels well Still golfs, and enjoys that, occasionally when he stands back up after bending to place the ball down, he will feel lightheaded. No CP, palpitations No near syncope or syncope.  Rarely he has a little blood in his urine, maybe once every 73mo or so   Device information SJM dual chamber PPM implanted 12/17/2017   Past Medical History:  Diagnosis Date   A-fib ([place])    Barrett's esophagus    Dizzy resolved   Dyspnea    with exertion occ   GERD (gastroesophageal reflux disease)    Hard of hearing    wears hearing aids bilat   High cholesterol    Hypertension    Presence of permanent cardiac pacemaker 12/17/2017   Prostate cancer ([place]) 2002   S/P "radiation for 8-9 weeks"   SOB (shortness of breath)     Past Surgical History:  Procedure Laterality Date   ABDOMINAL AORTOGRAM W/LOWER EXTREMITY Bilateral 05/17/2021   Procedure: ABDOMINAL AORTOGRAM W/LOWER EXTREMITY;  Surgeon: Serafina Mitchell, MD;  Location: [facility] [facility];  Service: Cardiovascular;  Laterality: Bilateral;   BALLOON DILATION N/A 09/05/2018   Procedure: BALLOON DILATION;  Surgeon: Milus Banister, MD;  Location: [facility]OS[facility];  Service: Endoscopy;  Laterality: N/A;   BILIARY STENT PLACEMENT   07/09/2018   Procedure: BILIARY STENT PLACEMENT;  Surgeon: Milus Banister, MD;  Location: [facility] ENDOSCOPY;  Service: Endoscopy;;   BILIARY STENT PLACEMENT N/A 09/05/2018   Procedure: BILIARY STENT PLACEMENT;  Surgeon: Milus Banister, MD;  Location: [facility];  Service: Endoscopy;  Laterality: N/A;   BIOPSY  07/05/2018   Procedure: BIOPSY;  Surgeon: Rush Landmark Telford Nab., MD;  Location: [facility];  Service: Gastroenterology;;   CHOLECYSTECTOMY     2020 late april or early may   ENDOSCOPIC RETROGRADE CHOLANGIOPANCREATOGRAPHY (ERCP) WITH PROPOFOL N/A 09/05/2018   Procedure: ENDOSCOPIC RETROGRADE CHOLANGIOPANCREATOGRAPHY (ERCP) WITH PROPOFOL;  Surgeon: Milus Banister, MD;  Location: [facility];  Service: Endoscopy;  Laterality: N/A;   ERCP N/A 07/09/2018   Procedure: ENDOSCOPIC RETROGRADE CHOLANGIOPANCREATOGRAPHY (ERCP);  Surgeon: Milus Banister, MD;  Location: [facility] ENDOSCOPY;  Service: Endoscopy;  Laterality: N/A;   ESOPHAGOGASTRODUODENOSCOPY (EGD) WITH PROPOFOL N/A 07/05/2018   Procedure: ESOPHAGOGASTRODUODENOSCOPY (EGD) WITH PROPOFOL;  Surgeon: Rush Landmark Telford Nab., MD;  Location: [place];  Service: Gastroenterology;  Laterality: N/A;   EUS  07/05/2018   Procedure: FULL UPPER ENDOSCOPIC ULTRASOUND (EUS) RADIAL;  Surgeon: Rush Landmark Telford Nab., MD;  Location: [place];  Service: Gastroenterology;;   INGUINAL HERNIA REPAIR Bilateral    INSERT / REPLACE / [place]  12/17/2017   IR CHOLANGIOGRAM EXISTING TUBE  09/10/2018   IR EXCHANGE BILIARY DRAIN  08/14/2018   IR EXCHANGE BILIARY DRAIN  10/08/2018   IR PERC CHOLECYSTOSTOMY  07/10/2018   PACEMAKER IMPLANT N/A 12/17/2017   Procedure: PACEMAKER IMPLANT;  Surgeon: Evans Lance, MD;  Location: [place] [facility];  Service: Cardiovascular;  Laterality: N/A;   PERIPHERAL VASCULAR INTERVENTION Left 05/17/2021   Procedure: PERIPHERAL VASCULAR INTERVENTION;  Surgeon: Serafina Mitchell, MD;  Location: [place] [facility];   Service: Cardiovascular;  Laterality: Left;  superficial femoral   PROSTATE BIOPSY     REMOVAL OF STONES  07/09/2018   Procedure: REMOVAL OF STONES;  Surgeon: Milus Banister, MD;  Location: [facility] ENDOSCOPY;  Service: Endoscopy;;   SPHINCTEROTOMY  07/09/2018   Procedure: [name];  Surgeon: Milus Banister, MD;  Location: [facility] ENDOSCOPY;  Service: Endoscopy;;   SPHINCTEROTOMY  09/05/2018   Procedure: [name];  Surgeon: Milus Banister, MD;  Location: [facility];  Service: Endoscopy;;   STENT REMOVAL  09/05/2018   Procedure: STENT REMOVAL;  Surgeon: Milus Banister, MD;  Location: [facility];  Service: Endoscopy;;   STONE EXTRACTION WITH BASKET  09/05/2018   Procedure: STONE EXTRACTION WITH BASKET;  Surgeon: Milus Banister, MD;  Location: [facility] ENDOS[facility];  Service: Endoscopy;;   THORACIC AORTIC ANEURYSM REPAIR  03/2018   TRANSURETHRAL RESECTION OF PROSTATE N/A 03/10/2019   Procedure: TRANSURETHRAL RESECTION OF THE PROSTATE (TURP);  Surgeon: Lucas Mallow, MD;  Location: [facility];  Service: Urology;  Laterality: N/A;    Current Outpatient Medications  Medication Sig Dispense Refill   apixaban (ELIQUIS) 2.5 MG TABS tablet TAKE 1 TABLET BY MOUTH TWICE A DAY 60 tablet 5   Cholecalciferol (VITAMIN D3) 2000 units TABS Take 2,000 Units by mouth daily.      diltiazem (CARDIZEM CD) 240 MG 24 hr capsule Take 1 capsule (240 mg total) by mouth daily. 90 capsule 2   furosemide (LASIX) 40 MG tablet Take 1 tablet (40 mg total) by mouth 2 (two) times daily. 180 tablet 3   Polyvinyl Alcohol-Povidone (REFRESH OP) Place 1 drop into both eyes 2 (two) times daily as needed (dry eyes).     No current facility-administered medications for this visit.    Allergies:   Penicillins   Social History:  The patient  reports that he quit smoking about 42 years ago. His smoking use included cigarettes. He has a 15.00 pack-year smoking history. He has never used smokeless tobacco. He reports that he  does not currently use alcohol after a past usage of about 1.0 standard drink per week. He reports that he does not use drugs.   Family History:  The patient's family history includes Hypertension in his mother; Kidney disease in his sister; Sleep apnea in his sister; Tuberculosis in his father.  ROS:  Please see the history of present illness.    All other systems are reviewed and otherwise negative.   PHYSICAL EXAM:  VS:  BP (!) 159/93   Pulse (!) 59   Ht 5\' 10"  (1.778 m)   Wt 214 lb (97.1 kg)   SpO2 96%   BMI 30.71 kg/m  BMI: Body mass index is 30.71 kg/m. Well nourished, well developed, in no acute distress HEENT: normocephalic, atraumatic Neck: no JVD, carotid bruits or masses Cardiac:  RRR; no significant murmurs, no rubs, or gallops Lungs:  CTA b/l, no wheezing, rhonchi or rales Abd: soft, nontender MS: no deformity or atrophy Ext: no edema Skin: warm and dry, no rash Neuro:  No gross deficits appreciated Psych: euthymic mood, full affect  PPM site is stable,  no tethering or discomfort   EKG:  not done today  Device interrogation done today and reviewed by myself:  Battery and lead measurements are good + AMS, 11% All available EGMs are reviewed, some are true AFib some of those with RVR Others look like FF on the A lead Oct 3. He had a 14 hour episodes General buren appears to be trending upwards (though not all is real AF)   07/12/2020: TTE  1. Left ventricular ejection fraction, by estimation, is 50 to 55%. The  left ventricle has low normal function. The left ventricle demonstrates  regional wall motion abnormalities (see scoring diagram/findings for  description). The left ventricular  internal cavity size was mildly dilated. There is moderate concentric left  ventricular hypertrophy. Left ventricular diastolic parameters are  indeterminate. There is dyskinesis of the left ventricular, basal-mid  inferior wall. There is hypokinesis of the   left  ventricular, basal-mid inferoseptal wall.   2. Right ventricular systolic function is normal. The right ventricular  size is normal. There is normal pulmonary artery systolic pressure.   3. Left atrial size was mildly dilated.   4. The mitral valve is grossly normal. Trivial mitral valve  regurgitation. No evidence of mitral stenosis. Moderate mitral annular  calcification.   5. The aortic valve is tricuspid. There is mild calcification of the  aortic valve. There is mild thickening of the aortic valve. Aortic valve  regurgitation is not visualized. No aortic stenosis is present.   6. Aortic dilatation noted. There is moderate dilatation of the ascending  aorta, measuring 47 mm.   Comparison(s): Prior images reviewed side by side.   Conclusion(s)/Recommendation(s): Side by side comparison shows that  current and immediate prior echoes are similar. There is likely mild  dyskinesis in basal inferior wall in prior images, better seen on current  study. Recommend use of echo contrast in  future to better delineate wall motion abnormalities.    Recent Labs: 12/17/2020: NT-Pro BNP 1,003 05/18/2021: BUN 23; Creatinine, Ser 1.50; Hemoglobin 12.5; Platelets 169; Potassium 3.5; Sodium 138  No results found for requested labs within last 8760 hours.   CrCl cannot be calculated (Patient's most recent lab result is older than the maximum 21 days allowed.).   Wt Readings from Last 3 Encounters:  07/15/21 214 lb (97.1 kg)  07/11/21 215 lb (97.5 kg)  05/17/21 215 lb (97.5 kg)     Other studies reviewed: Additional studies/records reviewed today include: summarized above  ASSESSMENT AND PLAN:  PPM Intact function No programming changes made  Paroxysmal Afib CHA2DS2Vasc is 4, on Eliquis, appropriately dosed for Creat/age 51 % burden Unaware Frequent PACs today noted  HTN uncontrolled   HLD Not addressed today   Increase his diltiazem to 240mg  daily see him back in  31mo   Disposition: F/u with remotes as usual and as above  Current medicines are reviewed at length with the patient today.  The patient did not have any concerns regarding medicines.  Venetia Night, PA-C 07/15/2021 1:37 PM     [place] [place] [address] [phone] (office)  [phone] (fax)

## 2021-07-15 ENCOUNTER — Encounter: Payer: Self-pay | Admitting: Physician Assistant

## 2021-07-15 ENCOUNTER — Ambulatory Visit (INDEPENDENT_AMBULATORY_CARE_PROVIDER_SITE_OTHER): Payer: Medicare Other | Admitting: Physician Assistant

## 2021-07-15 ENCOUNTER — Other Ambulatory Visit: Payer: Self-pay

## 2021-07-15 VITALS — BP 159/93 | HR 59 | Ht 70.0 in | Wt 214.0 lb

## 2021-07-15 DIAGNOSIS — I495 Sick sinus syndrome: Secondary | ICD-10-CM | POA: Diagnosis not present

## 2021-07-15 DIAGNOSIS — Z950 Presence of cardiac pacemaker: Secondary | ICD-10-CM

## 2021-07-15 DIAGNOSIS — I70213 Atherosclerosis of native arteries of extremities with intermittent claudication, bilateral legs: Secondary | ICD-10-CM | POA: Diagnosis not present

## 2021-07-15 DIAGNOSIS — I10 Essential (primary) hypertension: Secondary | ICD-10-CM | POA: Diagnosis not present

## 2021-07-15 DIAGNOSIS — I480 Paroxysmal atrial fibrillation: Secondary | ICD-10-CM | POA: Diagnosis not present

## 2021-07-15 LAB — CUP PACEART INCLINIC DEVICE CHECK
Date Time Interrogation Session: 20221104183541
Implantable Lead Implant Date: 20190408
Implantable Lead Implant Date: 20190408
Implantable Lead Location: 753859
Implantable Lead Location: 753860
Implantable Pulse Generator Implant Date: 20190408
Pulse Gen Model: 2272
Pulse Gen Serial Number: 9004195

## 2021-07-15 MED ORDER — DILTIAZEM HCL ER COATED BEADS 240 MG PO CP24: 240.0000 mg | ORAL_CAPSULE | Freq: Every day | ORAL | 2 refills | Status: DC

## 2021-07-15 NOTE — Patient Instructions (Signed)
Medication Instructions:   START TAKING DILTIAZEM 240 MG ONCE A DAY   *If you need a refill on your cardiac medications before your next appointment, please call your pharmacy*   Lab [place]    If you have labs (blood work) drawn today and your tests are completely normal, you will receive your results only by: [place] (if you have MyChart) OR A paper copy in the mail If you have any lab test that is abnormal or we need to change your treatment, we will call you to review the results.   Testing/Procedures: NONE ORDERED  TODAY    Follow-Up: At [facility], you and your health needs are our priority.  As part of our continuing mission to provide you with exceptional heart care, we have created designated Provider Care Teams.  These Care Teams include your primary Cardiologist (physician) and Advanced Practice Providers (APPs -  Physician Assistants and Nurse Practitioners) who all work together to provide you with the care you need, when you need it.  We recommend signing up for the patient portal called "MyChart".  Sign up information is provided on this After Visit Summary.  MyChart is used to connect with patients for Virtual Visits (Telemedicine).  Patients are able to view lab/test results, encounter notes, upcoming appointments, etc.  Non-urgent messages can be sent to your provider as well.   To learn more about what you can do with MyChart, go to [email or web address].    Your next appointment:    6 month(s)  The format for your next appointment:   In Person  Provider:   You may see Dr.Taylor  or one of the following Advanced Practice Providers on your designated Care Team:   Tommye Standard, [place]     Other Instructions

## 2021-07-27 DIAGNOSIS — R338 Other retention of urine: Secondary | ICD-10-CM | POA: Diagnosis not present

## 2021-08-16 ENCOUNTER — Emergency Department (HOSPITAL_COMMUNITY)
Admission: EM | Admit: 2021-08-16 | Discharge: 2021-08-16 | Disposition: A | Discharge status: Home / Self Care | Payer: Medicare Other | Attending: Emergency Medicine | Admitting: Emergency Medicine

## 2021-08-16 ENCOUNTER — Other Ambulatory Visit: Payer: Self-pay

## 2021-08-16 ENCOUNTER — Encounter (HOSPITAL_COMMUNITY): Payer: Self-pay

## 2021-08-16 DIAGNOSIS — Z87891 Personal history of nicotine dependence: Secondary | ICD-10-CM | POA: Diagnosis not present

## 2021-08-16 DIAGNOSIS — I4891 Unspecified atrial fibrillation: Secondary | ICD-10-CM | POA: Diagnosis not present

## 2021-08-16 DIAGNOSIS — T83091A Other mechanical complication of indwelling urethral catheter, initial encounter: Secondary | ICD-10-CM

## 2021-08-16 DIAGNOSIS — T83098A Other mechanical complication of other indwelling urethral catheter, initial encounter: Secondary | ICD-10-CM | POA: Insufficient documentation

## 2021-08-16 DIAGNOSIS — N183 Chronic kidney disease, stage 3 unspecified: Secondary | ICD-10-CM | POA: Diagnosis not present

## 2021-08-16 DIAGNOSIS — Z7901 Long term (current) use of anticoagulants: Secondary | ICD-10-CM | POA: Diagnosis not present

## 2021-08-16 DIAGNOSIS — Z79899 Other long term (current) drug therapy: Secondary | ICD-10-CM | POA: Diagnosis not present

## 2021-08-16 DIAGNOSIS — Z8546 Personal history of malignant neoplasm of prostate: Secondary | ICD-10-CM | POA: Diagnosis not present

## 2021-08-16 DIAGNOSIS — I129 Hypertensive chronic kidney disease with stage 1 through stage 4 chronic kidney disease, or unspecified chronic kidney disease: Secondary | ICD-10-CM | POA: Insufficient documentation

## 2021-08-16 DIAGNOSIS — Y846 Urinary catheterization as the cause of abnormal reaction of the patient, or of later complication, without mention of misadventure at the time of the procedure: Secondary | ICD-10-CM | POA: Diagnosis not present

## 2021-08-16 NOTE — ED Triage Notes (Signed)
Pt reports that his urinary catheter has been clogged since 3 pm yesterday.

## 2021-08-16 NOTE — ED Provider Notes (Signed)
[facility] DEPT Provider Note   CSN: [id number] Arrival date & time: 08/16/21  0044     History Chief Complaint  Patient presents with   Catheter clogged     Ernest Parker is a 87 y.o. male.  Patient presents to the emergency department with complaints of Foley catheter dysfunction.  Patient has a chronic indwelling Foley catheter.  This particular catheter was changed out 3 weeks ago at urology.  Patient reports that he has not had any drainage since 3 PM.  He feels fullness and pain in his bladder area which worsens when he moves.  He has not had any hematuria.  No fever.      Past Medical History:  Diagnosis Date   A-fib [facility])    Barrett's esophagus    Dizzy resolved   Dyspnea    with exertion occ   GERD (gastroesophageal reflux disease)    Hard of hearing    wears hearing aids bilat   High cholesterol    Hypertension    Presence of permanent cardiac pacemaker 12/17/2017   Prostate cancer ([place]) 2002   S/P "radiation for 8-9 weeks"   SOB (shortness of breath)     Patient Active Problem List   Diagnosis Date Noted   PAD (peripheral artery disease) ([place]) 05/17/2021   Pacemaker 05/03/2020   Urinary retention 03/10/2019   Cholecystitis    Gall stones, common bile duct    Mallory-Weiss syndrome    Cirrhosis of liver without ascites (HCC)    E coli bacteremia    PAF (paroxysmal atrial fibrillation) ([place]) 07/04/2018   Elevated troponin 07/04/2018   Syncope and collapse 07/01/2018   Abnormal LFTs 07/01/2018   CKD (chronic kidney disease) stage 3, GFR 30-59 ml/min (HCC) 07/01/2018   Sinus node dysfunction ([place]) 12/17/2017   TIA (transient ischemic attack) 11/12/2017   History of prostate cancer 11/12/2017   Atrial fibrillation with rapid ventricular response ([place])    Essential hypertension     Past Surgical History:  Procedure Laterality Date   ABDOMINAL AORTOGRAM W/LOWER EXTREMITY Bilateral 05/17/2021   Procedure: ABDOMINAL  AORTOGRAM W/LOWER EXTREMITY;  Surgeon: Serafina Mitchell, MD;  Location: [place] CV LAB;  Service: Cardiovascular;  Laterality: Bilateral;   BALLOON DILATION N/A 09/05/2018   Procedure: BALLOON DILATION;  Surgeon: Milus Banister, MD;  [facility] ENDOS[facility];  Service: Endoscopy;  Laterality: N/A;   BILIARY STENT PLACEMENT  07/09/2018   Procedure: BILIARY STENT PLACEMENT;  Surgeon: Milus Banister, MD;  Location: [facility] ENDOSCOPY;  Service: Endoscopy;;   BILIARY STENT PLACEMENT N/A 09/05/2018   Procedure: BILIARY STENT PLACEMENT;  Surgeon: Milus Banister, MD;  [facility]OS[facility];  Service: Endoscopy;  Laterality: N/A;   BIOPSY  07/05/2018   Procedure: BIOPSY;  Surgeon: Rush Landmark Telford Nab., MD;  Location: [facility] ENDOSCOPY;  Service: Gastroenterology;;   CHOLECYSTECTOMY     2020 late april or early may   ENDOSCOPIC RETROGRADE CHOLANGIOPANCREATOGRAPHY (ERCP) WITH PROPOFOL N/A 09/05/2018   Procedure: ENDOSCOPIC RETROGRADE CHOLANGIOPANCREATOGRAPHY (ERCP) WITH PROPOFOL;  Surgeon: Milus Banister, MD;  [facility];  Service: Endoscopy;  Laterality: N/A;   ERCP N/A 07/09/2018   Procedure: ENDOSCOPIC RETROGRADE CHOLANGIOPANCREATOGRAPHY (ERCP);  Surgeon: Milus Banister, MD;  Location: [facility] ENDOSCOPY;  Service: Endoscopy;  Laterality: N/A;   ESOPHAGOGASTRODUODENOSCOPY (EGD) WITH PROPOFOL N/A 07/05/2018   Procedure: ESOPHAGOGASTRODUODENOSCOPY (EGD) WITH PROPOFOL;  Surgeon: Rush Landmark Telford Nab., MD;  Location: [place];  Service: Gastroenterology;  Laterality: N/A;   EUS  07/05/2018  Procedure: FULL UPPER ENDOSCOPIC ULTRASOUND (EUS) RADIAL;  Surgeon: Rush Landmark Telford Nab., MD;  Location: [place];  Service: Gastroenterology;;   INGUINAL HERNIA REPAIR Bilateral    INSERT / REPLACE / [place]  12/17/2017   IR CHOLANGIOGRAM EXISTING TUBE  09/10/2018   IR EXCHANGE BILIARY DRAIN  08/14/2018   IR EXCHANGE BILIARY DRAIN  10/08/2018   IR PERC CHOLECYSTOSTOMY  07/10/2018    PACEMAKER IMPLANT N/A 12/17/2017   Procedure: PACEMAKER IMPLANT;  Surgeon: Evans Lance, MD;  Location: [place] CV LAB;  Service: Cardiovascular;  Laterality: N/A;   PERIPHERAL VASCULAR INTERVENTION Left 05/17/2021   Procedure: PERIPHERAL VASCULAR INTERVENTION;  Surgeon: Serafina Mitchell, MD;  Location: [place] CV LAB;  Service: Cardiovascular;  Laterality: Left;  superficial femoral   PROSTATE BIOPSY     REMOVAL OF STONES  07/09/2018   Procedure: REMOVAL OF STONES;  Surgeon: Milus Banister, MD;  Location: [facility] ENDOSCOPY;  Service: Endoscopy;;   SPHINCTEROTOMY  07/09/2018   Procedure: [name];  Surgeon: Milus Banister, MD;  Location: [facility] ENDOSCOPY;  Service: Endoscopy;;   SPHINCTEROTOMY  09/05/2018   Procedure: [name];  Surgeon: Milus Banister, MD;  [facility];  Service: Endoscopy;;   STENT REMOVAL  09/05/2018   Procedure: STENT REMOVAL;  Surgeon: Milus Banister, MD;  [facility];  Service: Endoscopy;;   STONE EXTRACTION WITH BASKET  09/05/2018   Procedure: STONE EXTRACTION WITH BASKET;  Surgeon: Milus Banister, MD;  [facility];  Service: Endoscopy;;   THORACIC AORTIC ANEURYSM REPAIR  03/2018   TRANSURETHRAL RESECTION OF PROSTATE N/A 03/10/2019   Procedure: TRANSURETHRAL RESECTION OF THE PROSTATE (TURP);  Surgeon: Lucas Mallow, MD;  [facility];  Service: Urology;  Laterality: N/A;       Family History  Problem Relation Age of Onset   Hypertension Mother    Tuberculosis Father    Kidney disease Sister    Sleep apnea Sister     Social History   Tobacco Use   Smoking status: Former    Packs/day: 0.50    Years: 30.00    Pack years: 15.00    Types: Cigarettes    Quit date: 1980    Years since quitting: 42.9   Smokeless tobacco: Never  Vaping Use   Vaping Use: Never used  Substance Use Topics   Alcohol use: Not Currently    Alcohol/week: 1.0 standard drink    Types: 1 Cans of beer per week   Drug use:  No    Home Medications Prior to Admission medications   Medication Sig Start Date End Date Taking? Authorizing Provider  apixaban (ELIQUIS) 2.5 MG TABS tablet TAKE 1 TABLET BY MOUTH TWICE A DAY 03/15/21   Evans Lance, MD  Cholecalciferol (VITAMIN D3) 2000 units TABS Take 2,000 Units by mouth daily.     [provider]  diltiazem (CARDIZEM CD) 240 MG 24 hr capsule Take 1 capsule (240 mg total) by mouth daily. 07/15/21   Baldwin Jamaica, PA-C  furosemide (LASIX) 40 MG tablet Take 1 tablet (40 mg total) by mouth 2 (two) times daily. 12/01/20 05/12/21  Evans Lance, MD  Polyvinyl Alcohol-Povidone (REFRESH OP) Place 1 drop into both eyes 2 (two) times daily as needed (dry eyes).    [provider]    Allergies    Penicillins  Review of Systems   Review of Systems  Constitutional:  Negative for fever.  Genitourinary:  Positive for decreased urine  volume. Negative for hematuria.  All other systems reviewed and are negative.  Physical Exam Updated Vital Signs BP (!) 157/86 (BP Location: Left Arm)   Pulse 85   Temp (!) 97.5 F (36.4 C) (Oral)   Resp 16   Ht 5\' 10"  (1.778 m)   Wt 95.3 kg   SpO2 94%   BMI 30.13 kg/m   Physical Exam Vitals and nursing note reviewed.  Constitutional:      General: He is not in acute distress.    Appearance: Normal appearance. He is well-developed.  HENT:     Head: Normocephalic and atraumatic.     Right Ear: Hearing normal.     Left Ear: Hearing normal.     Nose: Nose normal.  Eyes:     Conjunctiva/sclera: Conjunctivae normal.     Pupils: Pupils are equal, round, and reactive to light.  Cardiovascular:     Rate and Rhythm: Regular rhythm.     Heart sounds: S1 normal and S2 normal. No murmur heard.   No friction rub. No gallop.  Pulmonary:     Effort: Pulmonary effort is normal. No respiratory distress.     Breath sounds: Normal breath sounds.  Chest:     Chest wall: No tenderness.  Abdominal:     General: Bowel sounds  are normal.     Palpations: Abdomen is soft.     Tenderness: There is abdominal tenderness in the suprapubic area. There is no guarding or rebound. Negative signs include Murphy's sign and McBurney's sign.     Hernia: No hernia is present.  Musculoskeletal:        General: Normal range of motion.     Cervical back: Normal range of motion and neck supple.  Skin:    General: Skin is warm and dry.     Findings: No rash.  Neurological:     Mental Status: He is alert and oriented to person, place, and time.     GCS: GCS eye subscore is 4. GCS verbal subscore is 5. GCS motor subscore is 6.     Cranial Nerves: No cranial nerve deficit.     Sensory: No sensory deficit.     Coordination: Coordination normal.  Psychiatric:        Speech: Speech normal.        Behavior: Behavior normal.        Thought Content: Thought content normal.    ED Results / Procedures / Treatments   Labs (all labs ordered are listed, but only abnormal results are displayed) Labs Reviewed - No data to display  EKG None  Radiology No results found.  Procedures Procedures   Medications Ordered in ED Medications - No data to display  ED Course  I have reviewed the triage vital signs and the nursing notes.  Pertinent labs & imaging results that were available during my care of the patient were reviewed by me and considered in my medical decision making (see chart for details).    MDM Rules/Calculators/A&P                           Patient presents with obstruction of his chronic indwelling Foley catheter.  He appears well.  Foley catheter changed.  Follow-up with urology as needed.  Final Clinical Impression(s) / ED Diagnoses Final diagnoses:  Obstructed Foley catheter, initial encounter [facility])    Rx / DC Orders ED Discharge Orders     None  Orpah Greek, MD 08/16/21 [phone]

## 2021-08-18 DIAGNOSIS — N312 Flaccid neuropathic bladder, not elsewhere classified: Secondary | ICD-10-CM | POA: Diagnosis not present

## 2021-08-18 DIAGNOSIS — R8271 Bacteriuria: Secondary | ICD-10-CM | POA: Diagnosis not present

## 2021-08-19 ENCOUNTER — Telehealth: Payer: Self-pay

## 2021-08-19 NOTE — Telephone Encounter (Signed)
[facility] alert for HVR, long AT/AF EGM show AF with RVR, duration 37min, 35sec, HR 166 Event triggered by 20 cycle 150 Burden 9.8%, known PAF, +OAC Presenting is AF, route per protocol LR  Successful telephone call to patient to follow up on RVR symptoms. Patient states he is not feeling well, has no energy, and occasionally lightheaded when he stands. He presented to the ED 12/6 for foley cath complications and feels his lack of energy is related to his catheter "draining all the urine out of me". Reviewed medications and patient adherent with eliquis and Cardizem as prescribed. He is offered referral to AF clinic and declines. Requested patient send manual transmission but states he doesn't know how and he is ok. Lives with daughter who will not return home until after business hours. Patient advised of ED precautions. He admits to driving. Encouraged patient to not drive while symptomatic and not feeling well. Will route to Dr. Lovena Le for review secondary to RVR episodes. Patient has known history of AF.       PRESENTING

## 2021-08-31 ENCOUNTER — Encounter: Payer: Self-pay | Admitting: Internal Medicine

## 2021-08-31 ENCOUNTER — Ambulatory Visit (INDEPENDENT_AMBULATORY_CARE_PROVIDER_SITE_OTHER): Payer: Medicare Other | Admitting: Internal Medicine

## 2021-08-31 ENCOUNTER — Other Ambulatory Visit: Payer: Self-pay

## 2021-08-31 VITALS — BP 132/78 | HR 87 | Ht 70.0 in | Wt 214.0 lb

## 2021-08-31 DIAGNOSIS — I70213 Atherosclerosis of native arteries of extremities with intermittent claudication, bilateral legs: Secondary | ICD-10-CM | POA: Diagnosis not present

## 2021-08-31 DIAGNOSIS — Z950 Presence of cardiac pacemaker: Secondary | ICD-10-CM

## 2021-08-31 DIAGNOSIS — I480 Paroxysmal atrial fibrillation: Secondary | ICD-10-CM | POA: Diagnosis not present

## 2021-08-31 DIAGNOSIS — I10 Essential (primary) hypertension: Secondary | ICD-10-CM | POA: Diagnosis not present

## 2021-08-31 DIAGNOSIS — I495 Sick sinus syndrome: Secondary | ICD-10-CM | POA: Diagnosis not present

## 2021-08-31 LAB — CUP PACEART INCLINIC DEVICE CHECK
Battery Remaining Longevity: 73 mo
Battery Voltage: 2.99 V
Brady Statistic RA Percent Paced: 57 %
Brady Statistic RV Percent Paced: 5.7 %
Date Time Interrogation Session: 20221221163707
Implantable Lead Implant Date: 20190408
Implantable Lead Implant Date: 20190408
Implantable Lead Location: 753859
Implantable Lead Location: 753860
Implantable Pulse Generator Implant Date: 20190408
Lead Channel Impedance Value: 375 Ohm
Lead Channel Impedance Value: 487.5 Ohm
Lead Channel Pacing Threshold Amplitude: 1.25 V
Lead Channel Pacing Threshold Amplitude: 1.25 V
Lead Channel Pacing Threshold Pulse Width: 0.5 ms
Lead Channel Pacing Threshold Pulse Width: 0.5 ms
Lead Channel Sensing Intrinsic Amplitude: 12 mV
Lead Channel Sensing Intrinsic Amplitude: 2.4 mV
Lead Channel Setting Pacing Amplitude: 1.625
Lead Channel Setting Pacing Amplitude: 2.5 V
Lead Channel Setting Pacing Pulse Width: 0.5 ms
Lead Channel Setting Sensing Sensitivity: 2 mV
Pulse Gen Model: 2272
Pulse Gen Serial Number: 9004195

## 2021-08-31 MED ORDER — AMIODARONE HCL 200 MG PO TABS: 200.0000 mg | ORAL_TABLET | Freq: Every day | ORAL | 3 refills | Status: DC

## 2021-08-31 NOTE — Progress Notes (Signed)
HPI Ernest Parker returns today for ongoing evaluation and management of sinus node dysfunction status post permanent pacemaker insertion.  He has a history of hypertension and dyslipidemia.  He has been maintained on chronic systemic anticoagulation.  He has a history of hypertension, but most recently his blood pressure has been controlled.  Today he complained of fatigue.  He also has some dyspnea with exertion which is chronic. He has developed atrial fib. His VR is increased. He does not have palpitatations.  Allergies  Allergen Reactions   Penicillins Other (See Comments)    Patient can't remember reaction was told by mother he had a SERIOUS allergic rxn as a child  DID THE REACTION INVOLVE: Swelling of the face/tongue/throat, SOB, or low BP? Unknown Sudden or severe rash/hives, skin peeling, or the inside of the mouth or nose? Unknown Did it require medical treatment? Unknown When did it last happen?      Childhood allergy  If all above answers are "NO", may proceed with cephalosporin use.      Current Outpatient Medications  Medication Sig Dispense Refill   apixaban (ELIQUIS) 2.5 MG TABS tablet TAKE 1 TABLET BY MOUTH TWICE A DAY 60 tablet 5   Cholecalciferol (VITAMIN D3) 2000 units TABS Take 2,000 Units by mouth daily.      diltiazem (CARDIZEM CD) 240 MG 24 hr capsule Take 1 capsule (240 mg total) by mouth daily. 90 capsule 2   Polyvinyl Alcohol-Povidone (REFRESH OP) Place 1 drop into both eyes 2 (two) times daily as needed (dry eyes).     furosemide (LASIX) 40 MG tablet Take 1 tablet (40 mg total) by mouth 2 (two) times daily. 180 tablet 3   No current facility-administered medications for this visit.     Past Medical History:  Diagnosis Date   A-fib ([place])    Barrett's esophagus    Dizzy resolved   Dyspnea    with exertion occ   GERD (gastroesophageal reflux disease)    Hard of hearing    wears hearing aids bilat   High cholesterol    Hypertension    Presence  of permanent cardiac pacemaker 12/17/2017   Prostate cancer ([place]) 2002   S/P "radiation for 8-9 weeks"   SOB (shortness of breath)     ROS:   All systems reviewed and negative except as noted in the HPI.   Past Surgical History:  Procedure Laterality Date   ABDOMINAL AORTOGRAM W/LOWER EXTREMITY Bilateral 05/17/2021   Procedure: ABDOMINAL AORTOGRAM W/LOWER EXTREMITY;  Surgeon: Serafina Mitchell, MD;  Location: [place] CV LAB;  Service: Cardiovascular;  Laterality: Bilateral;   BALLOON DILATION N/A 09/05/2018   Procedure: BALLOON DILATION;  Surgeon: Milus Banister, MD;  [facility] ENDOS[facility];  Service: Endoscopy;  Laterality: N/A;   BILIARY STENT PLACEMENT  07/09/2018   Procedure: BILIARY STENT PLACEMENT;  Surgeon: Milus Banister, MD;  Location: [facility] ENDOSCOPY;  Service: Endoscopy;;   BILIARY STENT PLACEMENT N/A 09/05/2018   Procedure: BILIARY STENT PLACEMENT;  Surgeon: Milus Banister, MD;  [facility];  Service: Endoscopy;  Laterality: N/A;   BIOPSY  07/05/2018   Procedure: BIOPSY;  Surgeon: Rush Landmark Telford Nab., MD;  Location: [facility] ENDOSCOPY;  Service: Gastroenterology;;   CHOLECYSTECTOMY     2020 late april or early may   ENDOSCOPIC RETROGRADE CHOLANGIOPANCREATOGRAPHY (ERCP) WITH PROPOFOL N/A 09/05/2018   Procedure: ENDOSCOPIC RETROGRADE CHOLANGIOPANCREATOGRAPHY (ERCP) WITH PROPOFOL;  Surgeon: Milus Banister, MD;  [facility];  Service: Endoscopy;  Laterality: N/A;   ERCP N/A 07/09/2018   Procedure: ENDOSCOPIC RETROGRADE CHOLANGIOPANCREATOGRAPHY (ERCP);  Surgeon: Milus Banister, MD;  Location: [facility] ENDOSCOPY;  Service: Endoscopy;  Laterality: N/A;   ESOPHAGOGASTRODUODENOSCOPY (EGD) WITH PROPOFOL N/A 07/05/2018   Procedure: ESOPHAGOGASTRODUODENOSCOPY (EGD) WITH PROPOFOL;  Surgeon: Rush Landmark Telford Nab., MD;  Location: [place];  Service: Gastroenterology;  Laterality: N/A;   EUS  07/05/2018   Procedure: FULL UPPER ENDOSCOPIC ULTRASOUND (EUS)  RADIAL;  Surgeon: Rush Landmark Telford Nab., MD;  Location: [place];  Service: Gastroenterology;;   INGUINAL HERNIA REPAIR Bilateral    INSERT / REPLACE / [place]  12/17/2017   IR CHOLANGIOGRAM EXISTING TUBE  09/10/2018   IR EXCHANGE BILIARY DRAIN  08/14/2018   IR EXCHANGE BILIARY DRAIN  10/08/2018   IR PERC CHOLECYSTOSTOMY  07/10/2018   PACEMAKER IMPLANT N/A 12/17/2017   Procedure: PACEMAKER IMPLANT;  Surgeon: Evans Lance, MD;  Location: [place] CV LAB;  Service: Cardiovascular;  Laterality: N/A;   PERIPHERAL VASCULAR INTERVENTION Left 05/17/2021   Procedure: PERIPHERAL VASCULAR INTERVENTION;  Surgeon: Serafina Mitchell, MD;  Location: [place] CV LAB;  Service: Cardiovascular;  Laterality: Left;  superficial femoral   PROSTATE BIOPSY     REMOVAL OF STONES  07/09/2018   Procedure: REMOVAL OF STONES;  Surgeon: Milus Banister, MD;  Location: [facility] ENDOSCOPY;  Service: Endoscopy;;   SPHINCTEROTOMY  07/09/2018   Procedure: [name];  Surgeon: Milus Banister, MD;  Location: [facility] ENDOSCOPY;  Service: Endoscopy;;   SPHINCTEROTOMY  09/05/2018   Procedure: [name];  Surgeon: Milus Banister, MD;  [facility];  Service: Endoscopy;;   STENT REMOVAL  09/05/2018   Procedure: STENT REMOVAL;  Surgeon: Milus Banister, MD;  [facility];  Service: Endoscopy;;   STONE EXTRACTION WITH BASKET  09/05/2018   Procedure: STONE EXTRACTION WITH BASKET;  Surgeon: Milus Banister, MD;  [facility];  Service: Endoscopy;;   THORACIC AORTIC ANEURYSM REPAIR  03/2018   TRANSURETHRAL RESECTION OF PROSTATE N/A 03/10/2019   Procedure: TRANSURETHRAL RESECTION OF THE PROSTATE (TURP);  Surgeon: Lucas Mallow, MD;  [facility];  Service: Urology;  Laterality: N/A;     Family History  Problem Relation Age of Onset   Hypertension Mother    Tuberculosis Father    Kidney disease Sister    Sleep apnea Sister      Social History   Socioeconomic  History   Marital status: Widowed    Spouse name: Not on file   Number of children: Not on file   Years of education: Not on file   Highest education level: Not on file  Occupational History   Occupation: RETIRED  Tobacco Use   Smoking status: Former    Packs/day: 0.50    Years: 30.00    Pack years: 15.00    Types: Cigarettes    Quit date: 1980    Years since quitting: 43.0   Smokeless tobacco: Never  Vaping Use   Vaping Use: Never used  Substance and Sexual Activity   Alcohol use: Not Currently    Alcohol/week: 1.0 standard drink    Types: 1 Cans of beer per week   Drug use: No   Sexual activity: Not Currently  Other Topics Concern   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 Lives alone during the summer in [place] and lives with daughter during the Winter in [place], moving to  [place]. Has 1 daughter and 2 sons. One sone lives in [place] and one in [place].   Is an avid golfer, plays 4-5 days per week. Likes to do yardwork.    Social Determinants of Health   Financial Resource Strain: Not on file  Food Insecurity: Not on file  Transportation Needs: Not on file  Physical Activity: Not on file  Stress: Not on file  Social Connections: Not on file  Intimate Partner Violence: Not on file     BP 132/78    Pulse 87    Ht 5\' 10"  (1.778 m)    Wt 214 lb (97.1 kg)    SpO2 96%    BMI 30.71 kg/m   Physical Exam:  Well appearing NAD HEENT: Unremarkable Neck:  No JVD, no thyromegally Lymphatics:  No adenopathy Back:  No CVA tenderness Lungs:  Clear with no wheezes HEART:  Regular rate rhythm, no murmurs, no rubs, no clicks Abd:  soft, positive bowel sounds, no organomegally, no rebound, no guarding Ext:  2 plus pulses, no edema, no cyanosis, no clubbing Skin:  No rashes no nodules Neuro:  CN II through XII intact, motor grossly intact   DEVICE  Normal device function.  See PaceArt for details.    Assess/Plan:  1.  Sinus node dysfunction -the patient is asymptomatic status post [facility] Jude dual-chamber pacemaker insertion. 2.  Hypertension -his blood pressure is fairly well controlled. 3.  Fatigue-I have asked the patient to reduce his Toprol to once daily instead of twice daily.  If there is no improvement he is to call us and we will reuptitrate his Toprol. 4.  Permanent pacemaker insertion -his [place] dual-chamber pacemaker is working normally.  We will recheck in several months. 5. Atrial fib - this is new. He will start amiodarone 200 mg daily and plan for DCCV in 5-6 weeks if he does not revert back to rhythm and/or feel better.   Ernest Peru, MD

## 2021-08-31 NOTE — Patient Instructions (Addendum)
Medication Instructions:  Your physician has recommended you make the following change in your medication:    START taking amiodarone 200 mg-  Take one tablet by mouth daily.  Labwork: None ordered.  Testing/Procedures: None ordered.  Follow-Up: Your physician wants you to follow-up TO BE DETERMINED  Remote monitoring is used to monitor your Pacemaker from home. This monitoring reduces the number of office visits required to check your device to one time per year. It allows [place] to keep an eye on the functioning of your device to ensure it is working properly. You are scheduled for a device check from home on 10/06/2021. You may send your transmission at any time that day. If you have a wireless device, the transmission will be sent automatically. After your physician reviews your transmission, you will receive a postcard with your next transmission date.  Any Other Special Instructions Will Be Listed Below (If Applicable).  If you need a refill on your cardiac medications before your next appointment, please call your pharmacy.   Amiodarone Tablets What is this medication? AMIODARONE (a MEE oh da rone) prevents and treats a fast or irregular heartbeat (arrhythmia). It works by slowing down overactive electric signals in the heart, which stabilizes your heart rhythm. It belongs to a group of medications called antiarrhythmics. This medicine may be used for other purposes; ask your health care provider or pharmacist if you have questions. COMMON BRAND NAME(S): Cordarone, Pacerone What should I tell my care team before I take this medication? They need to know if you have any of these conditions: Liver disease Lung disease Other heart problems Thyroid disease An unusual or allergic reaction to amiodarone, iodine, other medications, foods, dyes, or preservatives Pregnant or trying to get pregnant Breast-feeding How should I use this medication? Take this medication by mouth with a glass  of water. Follow the directions on the prescription label. You can take this medication with or without food. However, you should always take it the same way each time. Take your doses at regular intervals. Do not take your medication more often than directed. Do not stop taking except on the advice of your care team. A special MedGuide will be given to you by the pharmacist with each prescription and refill. Be sure to read this information carefully each time. Talk to your care team regarding the use of this medication in children. Special care may be needed. Overdosage: If you think you have taken too much of this medicine contact a poison control center or emergency room at once. NOTE: This medicine is only for you. Do not share this medicine with others. What if I miss a dose? If you miss a dose, take it as soon as you can. If it is almost time for your next dose, take only that dose. Do not take double or extra doses. What may interact with this medication? Do not take this medication with any of the following: Abarelix Apomorphine Arsenic trioxide Certain antibiotics like erythromycin, gemifloxacin, levofloxacin, pentamidine Certain medications for depression like amoxapine, tricyclic antidepressants Certain medications for fungal infections like fluconazole, itraconazole, ketoconazole, posaconazole, voriconazole Certain medications for irregular heartbeat like disopyramide, dronedarone, ibutilide, propafenone, sotalol Certain medications for malaria like chloroquine, halofantrine Cisapride Droperidol Haloperidol [place] Maprotiline Methadone Phenothiazines like chlorpromazine, mesoridazine, thioridazine Pimozide Ranolazine Red yeast rice Vardenafil This medication may also interact with the following: Antiviral medications for HIV or AIDS Certain medications for blood pressure, heart disease, irregular heart beat Certain medications for cholesterol like atorvastatin,  cerivastatin, lovastatin, simvastatin Certain medications for hepatitis C like sofosbuvir and ledipasvir; sofosbuvir Certain medications for seizures like phenytoin Certain medications for thyroid problems Certain medications that treat or prevent blood clots like warfarin Cholestyramine Cimetidine Clopidogrel Cyclosporine Dextromethorphan Diuretics Dofetilide Fentanyl General anesthetics Grapefruit juice Lidocaine Loratadine Methotrexate Other medications that prolong the QT interval (cause an abnormal heart rhythm) Procainamide Quinidine Rifabutin, rifampin, or rifapentine St. John's Wort Trazodone Ziprasidone This list may not describe all possible interactions. Give your health care provider a list of all the medicines, herbs, non-prescription drugs, or dietary supplements you use. Also tell them if you smoke, drink alcohol, or use illegal drugs. Some items may interact with your medicine. What should I watch for while using this medication? Your condition will be monitored closely when you first begin therapy. Often, this medication is first started in a hospital or other monitored health care setting. Once you are on maintenance therapy, visit your care team for regular checks on your progress. Because your condition and use of this medication carry some risk, it is a good idea to carry an identification card, necklace or bracelet with details of your condition, medications, and care team. You may get drowsy or dizzy. Do not drive, use machinery, or do anything that needs mental alertness until you know how this medication affects you. Do not stand or sit up quickly, especially if you are an older patient. This reduces the risk of dizzy or fainting spells. This medication can make you more sensitive to the sun. Keep out of the sun. If you cannot avoid being in the sun, wear protective clothing and use sunscreen. Do not use sun lamps or tanning beds/booths. You should have regular  eye exams before and during treatment. Call your care team if you have blurred vision, see halos, or your eyes become sensitive to light. Your eyes may get dry. It may be helpful to use a lubricating eye solution or artificial tears solution. If you are going to have surgery or a procedure that requires contrast dyes, tell your care team that you are taking this medication. What side effects may I notice from receiving this medication? Side effects that you should report to your care team as soon as possible: Allergic reactions--skin rash, itching, hives, swelling of the face, lips, tongue, or throat Bluish-gray skin Change in vision such as blurry vision, seeing halos around lights, vision loss Heart failure--shortness of breath, swelling of the ankles, feet, or hands, sudden weight gain, unusual weakness or fatigue Heart rhythm changes--fast or irregular heartbeat, dizziness, feeling faint or lightheaded, chest pain, trouble breathing High thyroid levels (hyperthyroidism)--fast or irregular heartbeat, weight loss, excessive sweating or sensitivity to heat, tremors or shaking, anxiety, nervousness, irregular menstrual cycle or spotting Liver injury--right upper belly pain, loss of appetite, nausea, light-colored stool, dark yellow or brown urine, yellowing skin or eyes, unusual weakness or fatigue Low thyroid levels (hypothyroidism)--unusual weakness or fatigue, sensitivity to cold, constipation, hair loss, dry skin, weight gain, feelings of depression Lung injury--shortness of breath or trouble breathing, cough, spitting up blood, chest pain, fever Pain, tingling, or numbness in the hands or feet, muscle weakness, trouble walking, loss of balance or coordination Side effects that usually do not require medical attention (report to your care team if they continue or are bothersome): Nausea Vomiting This list may not describe all possible side effects. Call your doctor for medical advice about side  effects. You may report side effects to FDA at 1-800-FDA-1088. Where should I keep  my medication? Keep out of the reach of children and pets. Store at room temperature between 20 and 25 degrees C (68 and 77 degrees F). Protect from light. Keep container tightly closed. Throw away any unused medication after the expiration date. NOTE: This sheet is a summary. It may not cover all possible information. If you have questions about this medicine, talk to your doctor, pharmacist, or health care provider.  2022 Elsevier/Gold Standard (2020-10-22 00:00:00)

## 2021-09-04 ENCOUNTER — Other Ambulatory Visit: Payer: Self-pay | Admitting: Internal Medicine

## 2021-09-04 DIAGNOSIS — I4891 Unspecified atrial fibrillation: Secondary | ICD-10-CM

## 2021-09-06 NOTE — Telephone Encounter (Signed)
Pt last saw Dr Lovena Le 08/31/21, last labs 05/18/21 Creat 1.50,on 05/17/21 Creat 1.80, age 87, weight 97.1kg, based on specified criteria pt is on appropriate dosage of Eliquis 2.5mg  BID for afib.  Will refill rx.

## 2021-09-07 DIAGNOSIS — R338 Other retention of urine: Secondary | ICD-10-CM | POA: Diagnosis not present

## 2021-09-23 DIAGNOSIS — N312 Flaccid neuropathic bladder, not elsewhere classified: Secondary | ICD-10-CM | POA: Diagnosis not present

## 2021-09-23 DIAGNOSIS — R338 Other retention of urine: Secondary | ICD-10-CM | POA: Diagnosis not present

## 2021-09-27 DIAGNOSIS — N312 Flaccid neuropathic bladder, not elsewhere classified: Secondary | ICD-10-CM | POA: Diagnosis not present

## 2021-10-03 ENCOUNTER — Other Ambulatory Visit: Payer: Self-pay

## 2021-10-03 ENCOUNTER — Ambulatory Visit (INDEPENDENT_AMBULATORY_CARE_PROVIDER_SITE_OTHER)
Admission: RE | Admit: 2021-10-03 | Discharge: 2021-10-03 | Disposition: A | Discharge status: Home / Self Care | Payer: Medicare Other | Source: Ambulatory Visit | Attending: Surgery | Admitting: Surgery

## 2021-10-03 ENCOUNTER — Encounter: Payer: Self-pay | Admitting: Internal Medicine

## 2021-10-03 ENCOUNTER — Ambulatory Visit (INDEPENDENT_AMBULATORY_CARE_PROVIDER_SITE_OTHER): Payer: Medicare Other | Admitting: Physician Assistant

## 2021-10-03 ENCOUNTER — Ambulatory Visit (HOSPITAL_COMMUNITY)
Admission: RE | Admit: 2021-10-03 | Discharge: 2021-10-03 | Disposition: A | Discharge status: Home / Self Care | Payer: Medicare Other | Source: Ambulatory Visit | Attending: Surgery | Admitting: Surgery

## 2021-10-03 VITALS — BP 153/90 | HR 74 | Temp 97.3°F | Resp 18 | Ht 70.0 in | Wt 215.4 lb

## 2021-10-03 DIAGNOSIS — I739 Peripheral vascular disease, unspecified: Secondary | ICD-10-CM | POA: Insufficient documentation

## 2021-10-03 DIAGNOSIS — N320 Bladder-neck obstruction: Secondary | ICD-10-CM | POA: Insufficient documentation

## 2021-10-03 DIAGNOSIS — I70213 Atherosclerosis of native arteries of extremities with intermittent claudication, bilateral legs: Secondary | ICD-10-CM

## 2021-10-03 DIAGNOSIS — E7800 Pure hypercholesterolemia, unspecified: Secondary | ICD-10-CM | POA: Insufficient documentation

## 2021-10-03 DIAGNOSIS — R002 Palpitations: Secondary | ICD-10-CM | POA: Insufficient documentation

## 2021-10-03 DIAGNOSIS — E1121 Type 2 diabetes mellitus with diabetic nephropathy: Secondary | ICD-10-CM | POA: Insufficient documentation

## 2021-10-03 DIAGNOSIS — R0600 Dyspnea, unspecified: Secondary | ICD-10-CM | POA: Insufficient documentation

## 2021-10-03 DIAGNOSIS — I2510 Atherosclerotic heart disease of native coronary artery without angina pectoris: Secondary | ICD-10-CM | POA: Insufficient documentation

## 2021-10-03 DIAGNOSIS — E1151 Type 2 diabetes mellitus with diabetic peripheral angiopathy without gangrene: Secondary | ICD-10-CM | POA: Insufficient documentation

## 2021-10-03 NOTE — Progress Notes (Signed)
[facility] [place] HISTORY AND PHYSICAL   History of Present Illness:  Patient is a 88 y.o. year old male who presents for evaluation of claudication symptoms with known PAD.  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    Back in Sept 2022 he developed worsening claudication at shorter distances.  He is s/p left SFA stent placement 05/17/21 by Dr. Trula Slade.    Patient status post endovascular aneurysm repair elsewhere aneurysm diameter was 9.7 cm.  He denies new symptoms of claudication, rest pain or non healing wounds. His symptoms of claudication have resolved since surgery.  Past Medical History:  Diagnosis Date   A-fib ([place])    Barrett's esophagus    Dizzy resolved   Dyspnea    with exertion occ   GERD (gastroesophageal reflux disease)    Hard of hearing    wears hearing aids bilat   High cholesterol    Hypertension    Presence of permanent cardiac pacemaker 12/17/2017   Prostate cancer ([place]) 2002   S/P "radiation for 8-9 weeks"   SOB (shortness of breath)     Past Surgical History:  Procedure Laterality Date   ABDOMINAL AORTOGRAM W/LOWER EXTREMITY Bilateral 05/17/2021   Procedure: ABDOMINAL AORTOGRAM W/LOWER EXTREMITY;  Surgeon: Serafina Mitchell, MD;  Location: [facility];  Service: Cardiovascular;  Laterality: Bilateral;   BALLOON DILATION N/A 09/05/2018   Procedure: BALLOON DILATION;  Surgeon: Milus Banister, MD;  Location: [facility];  Service: Endoscopy;  Laterality: N/A;   BILIARY STENT PLACEMENT  07/09/2018   Procedure: BILIARY STENT PLACEMENT;  Surgeon: Milus Banister, MD;  Location[facility] ENDOSCOPY;  Service: Endoscopy;;   BILIARY STENT PLACEMENT N/A 09/05/2018   Procedure: BILIARY STENT PLACEMENT;  Surgeon: Milus Banister, MD;  Location: [facility]OSCOPY;  Service: Endoscopy;  Laterality: N/A;   BIOPSY  07/05/2018   Procedure:  BIOPSY;  Surgeon: Rush Landmark Telford Nab., MD;  Location: [facility] ENDOSCOPY;  Service: Gastroenterology;;   CHOLECYSTECTOMY     2020 late april or early may   ENDOSCOPIC RETROGRADE CHOLANGIOPANCREATOGRAPHY (ERCP) WITH PROPOFOL N/A 09/05/2018   Procedure: ENDOSCOPIC RETROGRADE CHOLANGIOPANCREATOGRAPHY (ERCP) WITH PROPOFOL;  Surgeon: Milus Banister, MD;  Location: [facility]OS[facility];  Service: Endoscopy;  Laterality: N/A;   ERCP N/A 07/09/2018   Procedure: ENDOSCOPIC RETROGRADE CHOLANGIOPANCREATOGRAPHY (ERCP);  Surgeon: Milus Banister, MD;  Location[facility] ENDOSCOPY;  Service: Endoscopy;  Laterality: N/A;   ESOPHAGOGASTRODUODENOSCOPY (EGD) WITH PROPOFOL N/A 07/05/2018   Procedure: ESOPHAGOGASTRODUODENOSCOPY (EGD) WITH PROPOFOL;  Surgeon: Rush Landmark Telford Nab., MD;  Location: [facility];  Service: Gastroenterology;  Laterality: N/A;   EUS  07/05/2018   Procedure: FULL UPPER ENDOSCOPIC ULTRASOUND (EUS) RADIAL;  Surgeon: Rush Landmark Telford Nab., MD;  Location: [facility];  Service: Gastroenterology;;   INGUINAL HERNIA REPAIR Bilateral    INSERT / REPLACE / [place]  12/17/2017   IR CHOLANGIOGRAM EXISTING TUBE  09/10/2018   IR EXCHANGE BILIARY DRAIN  08/14/2018   IR EXCHANGE BILIARY DRAIN  10/08/2018   IR PERC CHOLECYSTOSTOMY  07/10/2018   PACEMAKER IMPLANT N/A 12/17/2017   Procedure: PACEMAKER IMPLANT;  Surgeon: Evans Lance, MD;  Location: [facility];  Service: Cardiovascular;  Laterality: N/A;   PERIPHERAL VASCULAR INTERVENTION Left 05/17/2021   Procedure: PERIPHERAL VASCULAR INTERVENTION;  Surgeon: Serafina Mitchell, MD;  Location: [facility];  Service: Cardiovascular;  Laterality: Left;  superficial  femoral   PROSTATE BIOPSY     REMOVAL OF STONES  07/09/2018   Procedure: REMOVAL OF STONES;  Surgeon: Milus Banister, MD;  Location[facility] ENDOSCOPY;  Service: Endoscopy;;   SPHINCTEROTOMY  07/09/2018   Procedure: [name];  Surgeon: Milus Banister, MD;  Location: [facility]  ENDOSCOPY;  Service: Endoscopy;;   SPHINCTEROTOMY  09/05/2018   Procedure: [name];  Surgeon: Milus Banister, MD;  Location: [facility] ENDOS[facility];  Service: Endoscopy;;   STENT REMOVAL  09/05/2018   Procedure: STENT REMOVAL;  Surgeon: Milus Banister, MD;  Location: [facility] ENDOSCOPY;  Service: Endoscopy;;   STONE EXTRACTION WITH BASKET  09/05/2018   Procedure: STONE EXTRACTION WITH BASKET;  Surgeon: Milus Banister, MD;  Location: [facility] ENDOSCOPY;  Service: Endoscopy;;   THORACIC AORTIC ANEURYSM REPAIR  03/2018   TRANSURETHRAL RESECTION OF PROSTATE N/A 03/10/2019   Procedure: TRANSURETHRAL RESECTION OF THE PROSTATE (TURP);  Surgeon: Lucas Mallow, MD;  Location: [facility];  Service: Urology;  Laterality: N/A;    ROS:   General:  No weight loss, Fever, chills  HEENT: No recent headaches, no nasal bleeding, no visual changes, no sore throat  Neurologic: No dizziness, blackouts, seizures. No recent symptoms of stroke or mini- stroke. No recent episodes of slurred speech, or temporary blindness.  Cardiac: No recent episodes of chest pain/pressure, no shortness of breath at rest.  No shortness of breath with exertion.  Denies history of atrial fibrillation or irregular heartbeat  Vascular: No history of rest pain in feet.  No history of claudication.  No history of non-healing ulcer, No history of DVT   Pulmonary: No home oxygen, no productive cough, no hemoptysis,  No asthma or wheezing  Musculoskeletal:  [ ]  Arthritis, [ ]  Low back pain,  [ ]  Joint pain  Hematologic:No history of hypercoagulable state.  No history of easy bleeding.  No history of anemia  Gastrointestinal: No hematochezia or melena,  No gastroesophageal reflux, no trouble swallowing  Urinary: [ ]  chronic Kidney disease, [ ]  on HD - [ ]  MWF or [ ]  TTHS, [ ]  Burning with urination, [ ]  Frequent urination, [ ]  Difficulty urinating;   Skin: No rashes  Psychological: No history of anxiety,  No history of depression  Social  History Social History   Tobacco Use   Smoking status: Former    Packs/day: 0.50    Years: 30.00    Pack years: 15.00    Types: Cigarettes    Quit date: 1980    Years since quitting: 43.0   Smokeless tobacco: Never  Vaping Use   Vaping Use: Never used  Substance Use Topics   Alcohol use: Not Currently    Alcohol/week: 1.0 standard drink    Types: 1 Cans of beer per week   Drug use: No    Family History Family History  Problem Relation Age of Onset   Hypertension Mother    Tuberculosis Father    Kidney disease Sister    Sleep apnea Sister     Allergies  Allergies  Allergen Reactions   Penicillins Other (See Comments)    Patient can't remember reaction was told by mother he had a SERIOUS allergic rxn as a child  DID THE REACTION INVOLVE: Swelling of the face/tongue/throat, SOB, or low BP? Unknown Sudden or severe rash/hives, skin peeling, or the inside of the mouth or nose? Unknown Did it require medical treatment? Unknown When did it last happen?      Childhood allergy  If  all above answers are "NO", may proceed with cephalosporin use.      Current Outpatient Medications  Medication Sig Dispense Refill   amiodarone (PACERONE) 200 MG tablet Take 1 tablet (200 mg total) by mouth daily. 90 tablet 3   Cholecalciferol (VITAMIN D3) 2000 units TABS Take 2,000 Units by mouth daily.      diltiazem (CARDIZEM CD) 240 MG 24 hr capsule Take 1 capsule (240 mg total) by mouth daily. 90 capsule 2   ELIQUIS 2.5 MG TABS tablet TAKE 1 TABLET BY MOUTH TWICE A DAY 60 tablet 5   Polyvinyl Alcohol-Povidone (REFRESH OP) Place 1 drop into both eyes 2 (two) times daily as needed (dry eyes).     furosemide (LASIX) 40 MG tablet Take 1 tablet (40 mg total) by mouth 2 (two) times daily. 180 tablet 3   No current facility-administered medications for this visit.    Physical Examination  Vitals:   10/03/21 1108  BP: (!) 153/90  Pulse: 74  Resp: 18  Temp: (!) 97.3 F (36.3 C)   TempSrc: Oral  SpO2: 95%  Weight: 215 lb 6.4 oz (97.7 kg)  Height: 5\' 10"  (1.778 m)    Body mass index is 30.91 kg/m.  General:  Alert and oriented, no acute distress HEENT: Normal Neck: No bruit or JVD Pulmonary: Clear to auscultation bilaterally Cardiac: Regular Rate and Rhythm without murmur Abdomen: Soft, non-tender, non-distended, no mass, no scars Skin: No rash Musculoskeletal: No deformity or edema  Neurologic: Upper and lower extremity motor 5/5 and symmetric  DATA:  +----------+--------+-----+--------+--------+--------+   LEFT       PSV cm/s Ratio Stenosis Waveform Comments   +----------+--------+-----+--------+--------+--------+   CFA Distal 107                     biphasic            +----------+--------+-----+--------+--------+--------+   SFA Prox   117                     biphasic            +----------+--------+-----+--------+--------+--------+   SFA Mid                                     stent      +----------+--------+-----+--------+--------+--------+   SFA Distal                                  stent      +----------+--------+-----+--------+--------+--------+   POP Prox   43                      biphasic            +----------+--------+-----+--------+--------+--------+      Left Stent(s):  +---------------+--------+--------+--------+--------+   mid-distal SFA  PSV cm/s Stenosis Waveform Comments   +---------------+--------+--------+--------+--------+   Prox to Stent   91                biphasic            +---------------+--------+--------+--------+--------+   Proximal Stent  102               biphasic            +---------------+--------+--------+--------+--------+   Mid Stent       62  biphasic            +---------------+--------+--------+--------+--------+   Distal Stent    53                biphasic            +---------------+--------+--------+--------+--------+   Distal to Stent 42                biphasic             +---------------+--------+--------+--------+--------+      Summary:  Left: Widely patent superficial femoral artery stent without evidence of  stenosis.       ABI Findings:  +---------+------------------+-----+----------+--------+   Right     Rt Pressure (mmHg) Index Waveform   Comment    +---------+------------------+-----+----------+--------+   Brachial  180                                            +---------+------------------+-----+----------+--------+   PTA       136                0.72  monophasic            +---------+------------------+-----+----------+--------+   DP        99                 0.52  monophasic            +---------+------------------+-----+----------+--------+   Great Toe 95                 0.50                        +---------+------------------+-----+----------+--------+   +---------+------------------+-----+----------+-------+   Left      Lt Pressure (mmHg) Index Waveform   Comment   +---------+------------------+-----+----------+-------+   Brachial  189                                           +---------+------------------+-----+----------+-------+   PTA       119                0.63  monophasic           +---------+------------------+-----+----------+-------+   DP        151                0.80  biphasic             +---------+------------------+-----+----------+-------+   Great Toe 140                0.74                       +---------+------------------+-----+----------+-------+   +-------+-----------+-----------+------------+------------+   ABI/TBI Today's ABI Today's TBI Previous ABI Previous TBI   +-------+-----------+-----------+------------+------------+   Right   0.72        0.50        0.69         0.53           +-------+-----------+-----------+------------+------------+   Left    0.80        0.74        0.90         0.84           +-------+-----------+-----------+------------+------------+  Right ABIs appear  essentially unchanged. Left ABIs appear decreased  compared to prior study on 07/11/2021.     Summary:  Right: Resting right ankle-brachial index indicates moderate right lower  extremity arterial disease. The right toe-brachial index is abnormal.   Left: Resting left ankle-brachial index indicates mild left lower  extremity arterial disease. The left toe-brachial index is normal.   ASSESSMENT/PLAN:  PAD with history of sever B LE claudication symptoms. S/P left SFA stent placement for 80-90% SFA stenosis. ABI are reporting slight decrease, but his left LE claudication symptoms have improved.  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  He will f/u in 6 months for repeat duplex/ABI's.  If he has concerns he will call our office.       Roxy Horseman PA-C [facility] [place] Office: [phone]   MD in clinic [place]

## 2021-10-05 ENCOUNTER — Other Ambulatory Visit: Payer: Self-pay | Admitting: *Deleted

## 2021-10-05 DIAGNOSIS — I739 Peripheral vascular disease, unspecified: Secondary | ICD-10-CM

## 2021-10-05 DIAGNOSIS — I70213 Atherosclerosis of native arteries of extremities with intermittent claudication, bilateral legs: Secondary | ICD-10-CM

## 2021-10-06 ENCOUNTER — Ambulatory Visit (INDEPENDENT_AMBULATORY_CARE_PROVIDER_SITE_OTHER): Payer: Medicare Other

## 2021-10-06 DIAGNOSIS — I495 Sick sinus syndrome: Secondary | ICD-10-CM

## 2021-10-06 LAB — CUP PACEART REMOTE DEVICE CHECK
Battery Remaining Longevity: 71 mo
Battery Remaining Percentage: 65 %
Battery Voltage: 2.99 V
Brady Statistic AP VP Percent: 2.1 %
Brady Statistic AP VS Percent: 69 %
Brady Statistic AS VP Percent: 1.9 %
Brady Statistic AS VS Percent: 27 %
Brady Statistic RA Percent Paced: 47 %
Brady Statistic RV Percent Paced: 12 %
Date Time Interrogation Session: 20230126025441
Implantable Lead Implant Date: 20190408
Implantable Lead Implant Date: 20190408
Implantable Lead Location: 753859
Implantable Lead Location: 753860
Implantable Pulse Generator Implant Date: 20190408
Lead Channel Impedance Value: 410 Ohm
Lead Channel Impedance Value: 560 Ohm
Lead Channel Pacing Threshold Amplitude: 1.25 V
Lead Channel Pacing Threshold Amplitude: 1.375 V
Lead Channel Pacing Threshold Pulse Width: 0.5 ms
Lead Channel Pacing Threshold Pulse Width: 0.5 ms
Lead Channel Sensing Intrinsic Amplitude: 10.9 mV
Lead Channel Sensing Intrinsic Amplitude: 2.6 mV
Lead Channel Setting Pacing Amplitude: 2.375
Lead Channel Setting Pacing Amplitude: 2.5 V
Lead Channel Setting Pacing Pulse Width: 0.5 ms
Lead Channel Setting Sensing Sensitivity: 2 mV
Pulse Gen Model: 2272
Pulse Gen Serial Number: 9004195

## 2021-10-11 ENCOUNTER — Other Ambulatory Visit: Payer: Self-pay | Admitting: *Deleted

## 2021-10-11 DIAGNOSIS — I739 Peripheral vascular disease, unspecified: Secondary | ICD-10-CM

## 2021-10-11 DIAGNOSIS — I70213 Atherosclerosis of native arteries of extremities with intermittent claudication, bilateral legs: Secondary | ICD-10-CM

## 2021-10-17 ENCOUNTER — Other Ambulatory Visit: Payer: Self-pay | Admitting: Internal Medicine

## 2021-10-17 ENCOUNTER — Ambulatory Visit
Admission: RE | Admit: 2021-10-17 | Discharge: 2021-10-17 | Disposition: A | Discharge status: Home / Self Care | Payer: Medicare Other | Source: Ambulatory Visit | Attending: Internal Medicine | Admitting: Internal Medicine

## 2021-10-17 DIAGNOSIS — E1122 Type 2 diabetes mellitus with diabetic chronic kidney disease: Secondary | ICD-10-CM | POA: Diagnosis not present

## 2021-10-17 DIAGNOSIS — R0989 Other specified symptoms and signs involving the circulatory and respiratory systems: Secondary | ICD-10-CM | POA: Diagnosis not present

## 2021-10-17 DIAGNOSIS — J984 Other disorders of lung: Secondary | ICD-10-CM | POA: Diagnosis not present

## 2021-10-17 DIAGNOSIS — N1832 Chronic kidney disease, stage 3b: Secondary | ICD-10-CM | POA: Diagnosis not present

## 2021-10-17 DIAGNOSIS — E7800 Pure hypercholesterolemia, unspecified: Secondary | ICD-10-CM | POA: Diagnosis not present

## 2021-10-17 DIAGNOSIS — Z0000 Encounter for general adult medical examination without abnormal findings: Secondary | ICD-10-CM | POA: Diagnosis not present

## 2021-10-17 DIAGNOSIS — I517 Cardiomegaly: Secondary | ICD-10-CM | POA: Diagnosis not present

## 2021-10-17 DIAGNOSIS — Z1389 Encounter for screening for other disorder: Secondary | ICD-10-CM | POA: Diagnosis not present

## 2021-10-17 DIAGNOSIS — E1151 Type 2 diabetes mellitus with diabetic peripheral angiopathy without gangrene: Secondary | ICD-10-CM | POA: Diagnosis not present

## 2021-10-17 DIAGNOSIS — I739 Peripheral vascular disease, unspecified: Secondary | ICD-10-CM | POA: Diagnosis not present

## 2021-10-17 DIAGNOSIS — R918 Other nonspecific abnormal finding of lung field: Secondary | ICD-10-CM | POA: Diagnosis not present

## 2021-10-17 NOTE — Progress Notes (Signed)
Remote pacemaker transmission.   

## 2021-10-19 DIAGNOSIS — N312 Flaccid neuropathic bladder, not elsewhere classified: Secondary | ICD-10-CM | POA: Diagnosis not present

## 2021-11-11 DIAGNOSIS — R338 Other retention of urine: Secondary | ICD-10-CM | POA: Diagnosis not present

## 2021-11-16 DIAGNOSIS — H524 Presbyopia: Secondary | ICD-10-CM | POA: Diagnosis not present

## 2021-11-16 DIAGNOSIS — H5203 Hypermetropia, bilateral: Secondary | ICD-10-CM | POA: Diagnosis not present

## 2021-11-16 DIAGNOSIS — Z135 Encounter for screening for eye and ear disorders: Secondary | ICD-10-CM | POA: Diagnosis not present

## 2021-11-16 DIAGNOSIS — Z961 Presence of intraocular lens: Secondary | ICD-10-CM | POA: Diagnosis not present

## 2021-11-16 DIAGNOSIS — H52223 Regular astigmatism, bilateral: Secondary | ICD-10-CM | POA: Diagnosis not present

## 2021-11-30 DIAGNOSIS — N312 Flaccid neuropathic bladder, not elsewhere classified: Secondary | ICD-10-CM | POA: Diagnosis not present

## 2021-12-14 ENCOUNTER — Other Ambulatory Visit: Payer: Self-pay | Admitting: Internal Medicine

## 2021-12-21 DIAGNOSIS — R338 Other retention of urine: Secondary | ICD-10-CM | POA: Diagnosis not present

## 2022-01-05 ENCOUNTER — Ambulatory Visit (INDEPENDENT_AMBULATORY_CARE_PROVIDER_SITE_OTHER): Payer: Medicare Other

## 2022-01-05 DIAGNOSIS — I495 Sick sinus syndrome: Secondary | ICD-10-CM

## 2022-01-05 LAB — CUP PACEART REMOTE DEVICE CHECK
Battery Remaining Longevity: 67 mo
Battery Remaining Percentage: 62 %
Battery Voltage: 2.99 V
Brady Statistic AP VP Percent: 5.8 %
Brady Statistic AP VS Percent: 78 %
Brady Statistic AS VP Percent: 1.1 %
Brady Statistic AS VS Percent: 15 %
Brady Statistic RA Percent Paced: 68 %
Brady Statistic RV Percent Paced: 12 %
Date Time Interrogation Session: 20230427043140
Implantable Lead Implant Date: 20190408
Implantable Lead Implant Date: 20190408
Implantable Lead Location: 753859
Implantable Lead Location: 753860
Implantable Pulse Generator Implant Date: 20190408
Lead Channel Impedance Value: 400 Ohm
Lead Channel Impedance Value: 540 Ohm
Lead Channel Pacing Threshold Amplitude: 1.25 V
Lead Channel Pacing Threshold Amplitude: 1.375 V
Lead Channel Pacing Threshold Pulse Width: 0.5 ms
Lead Channel Pacing Threshold Pulse Width: 0.5 ms
Lead Channel Sensing Intrinsic Amplitude: 12 mV
Lead Channel Sensing Intrinsic Amplitude: 2.1 mV
Lead Channel Setting Pacing Amplitude: 2.375
Lead Channel Setting Pacing Amplitude: 2.5 V
Lead Channel Setting Pacing Pulse Width: 0.5 ms
Lead Channel Setting Sensing Sensitivity: 2 mV
Pulse Gen Model: 2272
Pulse Gen Serial Number: 9004195

## 2022-01-11 DIAGNOSIS — R338 Other retention of urine: Secondary | ICD-10-CM | POA: Diagnosis not present

## 2022-01-20 NOTE — Progress Notes (Signed)
Remote pacemaker transmission.   

## 2022-01-30 ENCOUNTER — Ambulatory Visit (INDEPENDENT_AMBULATORY_CARE_PROVIDER_SITE_OTHER): Payer: Medicare Other | Admitting: Internal Medicine

## 2022-01-30 ENCOUNTER — Encounter: Payer: Self-pay | Admitting: Internal Medicine

## 2022-01-30 VITALS — BP 134/70 | HR 60 | Ht 70.0 in | Wt 218.2 lb

## 2022-01-30 DIAGNOSIS — I480 Paroxysmal atrial fibrillation: Secondary | ICD-10-CM | POA: Diagnosis not present

## 2022-01-30 DIAGNOSIS — Z950 Presence of cardiac pacemaker: Secondary | ICD-10-CM

## 2022-01-30 DIAGNOSIS — I495 Sick sinus syndrome: Secondary | ICD-10-CM

## 2022-01-30 DIAGNOSIS — I70213 Atherosclerosis of native arteries of extremities with intermittent claudication, bilateral legs: Secondary | ICD-10-CM

## 2022-01-30 DIAGNOSIS — I10 Essential (primary) hypertension: Secondary | ICD-10-CM

## 2022-01-30 NOTE — Progress Notes (Signed)
HPI Ernest Parker returns today for followup. He is a pleasant 88 yo man with a h/o sinus node dysfunction, HTN, s/p PPM insertion who was found to have atrial fib at the time of his last visit. He was placed on anti-coagulation. He was started on amiodarone. He has maintained NSR over 95% of the time since starting the amio. He feels better.  Allergies  Allergen Reactions   Penicillins Other (See Comments)    Patient can't remember reaction was told by mother he had a SERIOUS allergic rxn as a child  DID THE REACTION INVOLVE: Swelling of the face/tongue/throat, SOB, or low BP? Unknown Sudden or severe rash/hives, skin peeling, or the inside of the mouth or nose? Unknown Did it require medical treatment? Unknown When did it last happen?      Childhood allergy  If all above answers are "NO", may proceed with cephalosporin use.      Current Outpatient Medications  Medication Sig Dispense Refill   amiodarone (PACERONE) 200 MG tablet Take 1 tablet (200 mg total) by mouth daily. 90 tablet 3   Cholecalciferol (VITAMIN D3) 2000 units TABS Take 2,000 Units by mouth daily.      diltiazem (CARDIZEM CD) 240 MG 24 hr capsule Take 1 capsule (240 mg total) by mouth daily. 90 capsule 2   ELIQUIS 2.5 MG TABS tablet TAKE 1 TABLET BY MOUTH TWICE A DAY 60 tablet 5   furosemide (LASIX) 40 MG tablet TAKE 1 TABLET BY MOUTH TWICE A DAY 180 tablet 3   Polyvinyl Alcohol-Povidone (REFRESH OP) Place 1 drop into both eyes 2 (two) times daily as needed (dry eyes).     No current facility-administered medications for this visit.     Past Medical History:  Diagnosis Date   A-fib ([place])    Barrett's esophagus    Dizzy resolved   Dyspnea    with exertion occ   GERD (gastroesophageal reflux disease)    Hard of hearing    wears hearing aids bilat   High cholesterol    Hypertension    Presence of permanent cardiac pacemaker 12/17/2017   Prostate cancer ([place]) 2002   S/P "radiation for 8-9 weeks"   SOB  (shortness of breath)     ROS:   All systems reviewed and negative except as noted in the HPI.   Past Surgical History:  Procedure Laterality Date   ABDOMINAL AORTOGRAM W/LOWER EXTREMITY Bilateral 05/17/2021   Procedure: ABDOMINAL AORTOGRAM W/LOWER EXTREMITY;  Surgeon: Serafina Mitchell, MD;  Location: [place] [facility];  Service: Cardiovascular;  Laterality: Bilateral;   BALLOON DILATION N/A 09/05/2018   Procedure: BALLOON DILATION;  Surgeon: Milus Banister, MD;  Location: [facility];  Service: Endoscopy;  Laterality: N/A;   BILIARY STENT PLACEMENT  07/09/2018   Procedure: BILIARY STENT PLACEMENT;  Surgeon: Milus Banister, MD;  Location: [facility] ENDOSCOPY;  Service: Endoscopy;;   BILIARY STENT PLACEMENT N/A 09/05/2018   Procedure: BILIARY STENT PLACEMENT;  Surgeon: Milus Banister, MD;  Location: [facility];  Service: Endoscopy;  Laterality: N/A;   BIOPSY  07/05/2018   Procedure: BIOPSY;  Surgeon: Rush Landmark Telford Nab., MD;  Location: [facility] ENDOSCOPY;  Service: Gastroenterology;;   CHOLECYSTECTOMY     2020 late april or early may   ENDOSCOPIC RETROGRADE CHOLANGIOPANCREATOGRAPHY (ERCP) WITH PROPOFOL N/A 09/05/2018   Procedure: ENDOSCOPIC RETROGRADE CHOLANGIOPANCREATOGRAPHY (ERCP) WITH PROPOFOL;  Surgeon: Milus Banister, MD;  Location: [facility];  Service: Endoscopy;  Laterality: N/A;   ERCP  N/A 07/09/2018   Procedure: ENDOSCOPIC RETROGRADE CHOLANGIOPANCREATOGRAPHY (ERCP);  Surgeon: Milus Banister, MD;  Location: [facility] ENDOSCOPY;  Service: Endoscopy;  Laterality: N/A;   ESOPHAGOGASTRODUODENOSCOPY (EGD) WITH PROPOFOL N/A 07/05/2018   Procedure: ESOPHAGOGASTRODUODENOSCOPY (EGD) WITH PROPOFOL;  Surgeon: Rush Landmark Telford Nab., MD;  Location: [place];  Service: Gastroenterology;  Laterality: N/A;   EUS  07/05/2018   Procedure: FULL UPPER ENDOSCOPIC ULTRASOUND (EUS) RADIAL;  Surgeon: Rush Landmark Telford Nab., MD;  Location: [place];  Service: Gastroenterology;;   INGUINAL  HERNIA REPAIR Bilateral    INSERT / REPLACE / [place]  12/17/2017   IR CHOLANGIOGRAM EXISTING TUBE  09/10/2018   IR EXCHANGE BILIARY DRAIN  08/14/2018   IR EXCHANGE BILIARY DRAIN  10/08/2018   IR PERC CHOLECYSTOSTOMY  07/10/2018   PACEMAKER IMPLANT N/A 12/17/2017   Procedure: PACEMAKER IMPLANT;  Surgeon: Evans Lance, MD;  Location: [place] [facility];  Service: Cardiovascular;  Laterality: N/A;   PERIPHERAL VASCULAR INTERVENTION Left 05/17/2021   Procedure: PERIPHERAL VASCULAR INTERVENTION;  Surgeon: Serafina Mitchell, MD;  Location: [place] [facility];  Service: Cardiovascular;  Laterality: Left;  superficial femoral   PROSTATE BIOPSY     REMOVAL OF STONES  07/09/2018   Procedure: REMOVAL OF STONES;  Surgeon: Milus Banister, MD;  Location: [facility] ENDOSCOPY;  Service: Endoscopy;;   SPHINCTEROTOMY  07/09/2018   Procedure: [name];  Surgeon: Milus Banister, MD;  Location: [facility]COPY;  Service: Endoscopy;;   SPHINCTEROTOMY  09/05/2018   Procedure: [name];  Surgeon: Milus Banister, MD;  Location[facility];  Service: Endoscopy;;   STENT REMOVAL  09/05/2018   Procedure: STENT REMOVAL;  Surgeon: Milus Banister, MD;  Location: [facility];  Service: Endoscopy;;   STONE EXTRACTION WITH BASKET  09/05/2018   Procedure: STONE EXTRACTION WITH BASKET;  Surgeon: Milus Banister, MD;  Location: [facility];  Service: Endoscopy;;   THORACIC AORTIC ANEURYSM REPAIR  03/2018   TRANSURETHRAL RESECTION OF PROSTATE N/A 03/10/2019   Procedure: TRANSURETHRAL RESECTION OF THE PROSTATE (TURP);  Surgeon: Lucas Mallow, MD;  Location: [facility];  Service: Urology;  Laterality: N/A;     Family History  Problem Relation Age of Onset   Hypertension Mother    Tuberculosis Father    Kidney disease Sister    Sleep apnea Sister      Social History   Socioeconomic History   Marital status: Widowed    Spouse name: Not on file   Number of children: Not on file   Years of  education: Not on file   Highest education level: Not on file  Occupational History   Occupation: RETIRED  Tobacco Use   Smoking status: Former    Packs/day: 0.50    Years: 30.00    Pack years: 15.00    Types: Cigarettes    Quit date: 1980    Years since quitting: 43.4   Smokeless tobacco: Never  Vaping Use   Vaping Use: Never used  Substance and Sexual Activity   Alcohol use: Not Currently    Alcohol/week: 1.0 standard drink    Types: 1 Cans of beer per week   Drug use: No   Sexual activity: Not Currently  Other Topics Concern   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 Lives alone during the summer in [place] and lives with daughter during the Winter in [place], moving to [place]. Has 1 daughter and  2 sons. One sone lives in [place] and one in [place].   Is an avid golfer, plays 4-5 days per week. Likes to do yardwork.    Social Determinants of Health   Financial Resource Strain: Not on file  Food Insecurity: Not on file  Transportation Needs: Not on file  Physical Activity: Not on file  Stress: Not on file  Social Connections: Not on file  Intimate Partner Violence: Not on file     There were no vitals taken for this visit.  Physical Exam:  Well appearing NAD HEENT: Unremarkable Neck:  No JVD, no thyromegally Lymphatics:  No adenopathy Back:  No CVA tenderness Lungs:  Clear HEART:  Regular rate rhythm, no murmurs, no rubs, no clicks Abd:  soft, positive bowel sounds, no organomegally, no rebound, no guarding Ext:  2 plus pulses, no edema, no cyanosis, no clubbing Skin:  No rashes no nodules Neuro:  CN II through XII intact, motor grossly intact  EKG  DEVICE  Normal device function.  See PaceArt for details.   Assess/Plan:  Persistent Atrial fib - since starting amiodarone, he has mostly maintained NSR. Continue amio 200 mg daily. When I see him back in a year, we will start to reduce  his dose. Sinus node dysfunction - he is asymptomatic s/p PPM insertion. HTN - his bp is  Fatigue - since going back to NSR, he feels better and his fatigue has resolved except when he tries to do anything quickly PM - His [employer] DDD PPM is working normally. However, his histograms are blunted. I have increased the rate response.  Carleene Overlie Willadene Mounsey,MD

## 2022-01-30 NOTE — Patient Instructions (Signed)
Medication Instructions:  Your physician recommends that you continue on your current medications as directed. Please refer to the Current Medication list given to you today. *If you need a refill on your cardiac medications before your next appointment, please call your pharmacy*  Lab Work: None. If you have labs (blood work) drawn today and your tests are completely normal, you will receive your results only by: MyChart Message (if you have MyChart) OR A paper copy in the mail If you have any lab test that is abnormal or we need to change your treatment, we will call you to review the results.  Testing/Procedures: None.  Follow-Up: At [facility], you and your health needs are our priority.  As part of our continuing mission to provide you with exceptional heart care, we have created designated Provider Care Teams.  These Care Teams include your primary Cardiologist (physician) and Advanced Practice Providers (APPs -  Physician Assistants and Nurse Practitioners) who all work together to provide you with the care you need, when you need it.  Your physician wants you to follow-up in: 12 months with Gregg Taylor, MD or one of the following Advanced Practice Providers on your designated Care Team:    Renee Ursuy, PA-C Michael "Andy" Tillery, PA-C   You will receive a reminder letter in the mail two months in advance. If you don't receive a letter, please call our office to schedule the follow-up appointment.  We recommend signing up for the patient portal called "MyChart".  Sign up information is provided on this After Visit Summary.  MyChart is used to connect with patients for Virtual Visits (Telemedicine).  Patients are able to view lab/test results, encounter notes, upcoming appointments, etc.  Non-urgent messages can be sent to your provider as well.   To learn more about what you can do with MyChart, go to [email or web address]://www.mychart.com.    Any Other Special Instructions Will Be Listed  Below (If Applicable).         

## 2022-02-01 DIAGNOSIS — R338 Other retention of urine: Secondary | ICD-10-CM | POA: Diagnosis not present

## 2022-02-22 DIAGNOSIS — R338 Other retention of urine: Secondary | ICD-10-CM | POA: Diagnosis not present

## 2022-03-16 DIAGNOSIS — N312 Flaccid neuropathic bladder, not elsewhere classified: Secondary | ICD-10-CM | POA: Diagnosis not present

## 2022-03-18 ENCOUNTER — Other Ambulatory Visit: Payer: Self-pay | Admitting: Internal Medicine

## 2022-03-18 DIAGNOSIS — I4891 Unspecified atrial fibrillation: Secondary | ICD-10-CM

## 2022-03-20 NOTE — Telephone Encounter (Signed)
Eliquis 2.'5mg'$  refill request received. Patient is 88 years old, weight-99kg, Crea-1.50 on 05/18/2021, Diagnosis-Afib, and last seen by Dr. Lovena Le on 01/30/2022. Dose is appropriate based on dosing criteria. Will send in refill to requested pharmacy.

## 2022-04-05 DIAGNOSIS — N312 Flaccid neuropathic bladder, not elsewhere classified: Secondary | ICD-10-CM | POA: Diagnosis not present

## 2022-04-06 ENCOUNTER — Ambulatory Visit (INDEPENDENT_AMBULATORY_CARE_PROVIDER_SITE_OTHER): Payer: Medicare Other

## 2022-04-06 DIAGNOSIS — I495 Sick sinus syndrome: Secondary | ICD-10-CM

## 2022-04-06 LAB — CUP PACEART REMOTE DEVICE CHECK
Battery Remaining Longevity: 64 mo
Battery Remaining Percentage: 60 %
Battery Voltage: 2.99 V
Brady Statistic AP VP Percent: 26 %
Brady Statistic AP VS Percent: 64 %
Brady Statistic AS VP Percent: 1.1 %
Brady Statistic AS VS Percent: 8.4 %
Brady Statistic RA Percent Paced: 88 %
Brady Statistic RV Percent Paced: 28 %
Date Time Interrogation Session: 20230727020019
Implantable Lead Implant Date: 20190408
Implantable Lead Implant Date: 20190408
Implantable Lead Location: 753859
Implantable Lead Location: 753860
Implantable Pulse Generator Implant Date: 20190408
Lead Channel Impedance Value: 400 Ohm
Lead Channel Impedance Value: 550 Ohm
Lead Channel Pacing Threshold Amplitude: 1.25 V
Lead Channel Pacing Threshold Amplitude: 1.875 V
Lead Channel Pacing Threshold Pulse Width: 0.5 ms
Lead Channel Pacing Threshold Pulse Width: 0.5 ms
Lead Channel Sensing Intrinsic Amplitude: 12 mV
Lead Channel Sensing Intrinsic Amplitude: 2.1 mV
Lead Channel Setting Pacing Amplitude: 2.5 V
Lead Channel Setting Pacing Amplitude: 3.375
Lead Channel Setting Pacing Pulse Width: 0.5 ms
Lead Channel Setting Sensing Sensitivity: 2 mV
Pulse Gen Model: 2272
Pulse Gen Serial Number: 9004195

## 2022-04-07 ENCOUNTER — Other Ambulatory Visit: Payer: Self-pay | Admitting: Physician Assistant

## 2022-04-19 DIAGNOSIS — E1151 Type 2 diabetes mellitus with diabetic peripheral angiopathy without gangrene: Secondary | ICD-10-CM | POA: Diagnosis not present

## 2022-04-19 DIAGNOSIS — E7800 Pure hypercholesterolemia, unspecified: Secondary | ICD-10-CM | POA: Diagnosis not present

## 2022-04-19 DIAGNOSIS — Z8546 Personal history of malignant neoplasm of prostate: Secondary | ICD-10-CM | POA: Diagnosis not present

## 2022-04-19 DIAGNOSIS — E1122 Type 2 diabetes mellitus with diabetic chronic kidney disease: Secondary | ICD-10-CM | POA: Diagnosis not present

## 2022-04-19 DIAGNOSIS — D6869 Other thrombophilia: Secondary | ICD-10-CM | POA: Diagnosis not present

## 2022-04-19 DIAGNOSIS — I4891 Unspecified atrial fibrillation: Secondary | ICD-10-CM | POA: Diagnosis not present

## 2022-04-19 DIAGNOSIS — I10 Essential (primary) hypertension: Secondary | ICD-10-CM | POA: Diagnosis not present

## 2022-04-19 DIAGNOSIS — N1832 Chronic kidney disease, stage 3b: Secondary | ICD-10-CM | POA: Diagnosis not present

## 2022-04-19 DIAGNOSIS — I739 Peripheral vascular disease, unspecified: Secondary | ICD-10-CM | POA: Diagnosis not present

## 2022-04-19 DIAGNOSIS — N320 Bladder-neck obstruction: Secondary | ICD-10-CM | POA: Diagnosis not present

## 2022-04-21 DIAGNOSIS — R338 Other retention of urine: Secondary | ICD-10-CM | POA: Diagnosis not present

## 2022-04-21 DIAGNOSIS — N312 Flaccid neuropathic bladder, not elsewhere classified: Secondary | ICD-10-CM | POA: Diagnosis not present

## 2022-04-27 NOTE — Progress Notes (Signed)
Remote pacemaker transmission.   

## 2022-05-10 DIAGNOSIS — R338 Other retention of urine: Secondary | ICD-10-CM | POA: Diagnosis not present

## 2022-05-30 ENCOUNTER — Emergency Department (HOSPITAL_COMMUNITY)
Admission: EM | Admit: 2022-05-30 | Discharge: 2022-05-30 | Disposition: A | Discharge status: Home / Self Care | Payer: Medicare Other | Attending: Emergency Medicine | Admitting: Emergency Medicine

## 2022-05-30 ENCOUNTER — Other Ambulatory Visit: Payer: Self-pay

## 2022-05-30 ENCOUNTER — Encounter (HOSPITAL_COMMUNITY): Payer: Self-pay

## 2022-05-30 DIAGNOSIS — T83091A Other mechanical complication of indwelling urethral catheter, initial encounter: Secondary | ICD-10-CM | POA: Insufficient documentation

## 2022-05-30 DIAGNOSIS — R140 Abdominal distension (gaseous): Secondary | ICD-10-CM | POA: Insufficient documentation

## 2022-05-30 DIAGNOSIS — Z978 Presence of other specified devices: Secondary | ICD-10-CM

## 2022-05-30 NOTE — ED Notes (Signed)
Patient see's Dr Gloriann Loan as his urologist. Patient has an appointment in the morning. Has his catheter changed every 2-3 weeks.

## 2022-05-30 NOTE — Discharge Instructions (Signed)
Your Foley catheter was replaced in the ER.  Follow-up with urology as planned.

## 2022-05-30 NOTE — ED Triage Notes (Signed)
Patient notice around 530pm that his catheter may be blocked. Patient is having abdominal pain and urine is flowing around the catheter insertion site.

## 2022-05-31 NOTE — ED Provider Notes (Signed)
[place] DEPT Provider Note   CSN: [id number] Arrival date & time: 05/30/22  2018     History  Chief Complaint  Patient presents with   Foley Catheter issue    Ernest Parker is a 88 y.o. male.  HPI    88 year old male comes in with chief complaint of Foley catheter issue. Patient has chronic indwelling Foley catheter.  States that he started noticing reduced output earlier today and now the urine is flowing around the catheter.  He wanted to come to the ER to get checked out before he ends up with obstruction related pain.  In the past he has had postobstructive discomfort.  Denies any nausea, vomiting, fevers, chills.  No blood in the urine.  Home Medications Prior to Admission medications   Medication Sig Start Date End Date Taking? Authorizing Provider  amiodarone (PACERONE) 200 MG tablet Take 1 tablet (200 mg total) by mouth daily. 08/31/21   Evans Lance, MD  Cholecalciferol (VITAMIN D3) 2000 units TABS Take 2,000 Units by mouth daily.     [provider]  diltiazem (CARDIZEM CD) 240 MG 24 hr capsule TAKE 1 CAPSULE BY MOUTH EVERY DAY 04/07/22   Evans Lance, MD  ELIQUIS 2.5 MG TABS tablet TAKE 1 TABLET BY MOUTH TWICE A DAY 03/20/22   Evans Lance, MD  furosemide (LASIX) 40 MG tablet TAKE 1 TABLET BY MOUTH TWICE A DAY 12/14/21   Evans Lance, MD  Polyvinyl Alcohol-Povidone (REFRESH OP) Place 1 drop into both eyes 2 (two) times daily as needed (dry eyes).    [provider]  rosuvastatin (CRESTOR) 10 MG tablet Take 10 mg by mouth daily. 01/13/22   [provider]      Allergies    Penicillins    Review of Systems   Review of Systems  All other systems reviewed and are negative.   Physical Exam Updated Vital Signs BP (!) 181/82   Pulse 66   Temp 97.6 F (36.4 C) (Oral)   Resp 18   Ht '5\' 10"'$  (1.778 m)   Wt 97.5 kg   SpO2 92%   BMI 30.85 kg/m  Physical Exam Vitals and nursing note reviewed.   Constitutional:      Appearance: He is well-developed.  HENT:     Head: Atraumatic.  Cardiovascular:     Rate and Rhythm: Normal rate.  Pulmonary:     Effort: Pulmonary effort is normal.  Abdominal:     General: There is distension.     Tenderness: There is no abdominal tenderness.  Genitourinary:    Comments: 41 French Foley catheter in place Musculoskeletal:     Cervical back: Neck supple.  Skin:    General: Skin is warm.  Neurological:     Mental Status: He is alert and oriented to person, place, and time.     ED Results / Procedures / Treatments   Labs (all labs ordered are listed, but only abnormal results are displayed) Labs Reviewed - No data to display  EKG None  Radiology No results found.  Procedures Procedures    Medications Ordered in ED Medications - No data to display  ED Course/ Medical Decision Making/ A&P                           Medical Decision Making  This patient presents to the ED with chief complaint(s) of reduced urine output with pertinent past medical  history of chronic indwelling Foley catheter placement which is changed every month and previous history of Foley obstruction.  The differential diagnosis includes obstructive uropathy, clogged Foley/mechanical obstruction, UTI.  Patient has no clinical symptoms of UTI.  No back pain, no lower abdominal pain. The obstruction started earlier today, therefore suspicion for postobstructive renal failure is quite low.  I do not think labs are indicated at this time.  Plan is to pretty much change the Foley catheter now and have patient see the urologist as planned tomorrow.  Additional history obtained: Records reviewed  previous urology visits    Final Clinical Impression(s) / ED Diagnoses Final diagnoses:  Obstruction of Foley catheter, initial encounter [facility])  Status post insertion of Foley catheter    Rx / DC Orders ED Discharge Orders     None         Varney Biles, MD 06/05/22 1517

## 2022-06-15 DIAGNOSIS — R059 Cough, unspecified: Secondary | ICD-10-CM | POA: Diagnosis not present

## 2022-06-15 DIAGNOSIS — J069 Acute upper respiratory infection, unspecified: Secondary | ICD-10-CM | POA: Diagnosis not present

## 2022-06-15 DIAGNOSIS — Z03818 Encounter for observation for suspected exposure to other biological agents ruled out: Secondary | ICD-10-CM | POA: Diagnosis not present

## 2022-06-16 ENCOUNTER — Telehealth: Payer: Self-pay

## 2022-06-16 NOTE — Telephone Encounter (Signed)
     Patient  visit on 05/30/2022  at [facility] was for complication of indwelling urethral catheter.  Have you been able to follow up with your primary care physician? Patient had follow up visit with his Urologist.  The patient was or was not able to obtain any needed medicine or equipment. No medications were prescribed.  Are there diet recommendations that you are having difficulty following? No.  Patient expresses understanding of discharge instructions and education provided has no other needs at this time.    [place] Resource Care Guide   [email or web address]  ?? [phone]   Website: [email or web address]  [email or web address]

## 2022-06-19 DIAGNOSIS — R338 Other retention of urine: Secondary | ICD-10-CM | POA: Diagnosis not present

## 2022-06-21 ENCOUNTER — Encounter (HOSPITAL_COMMUNITY): Payer: Self-pay

## 2022-06-21 ENCOUNTER — Inpatient Hospital Stay (HOSPITAL_COMMUNITY)
Admission: EM | Admit: 2022-06-21 | Discharge: 2022-07-03 | DRG: 193 | Disposition: A | Discharge status: Other Acute Inpatient Hospital | Payer: Medicare Other | Attending: Internal Medicine | Admitting: Internal Medicine

## 2022-06-21 ENCOUNTER — Emergency Department (HOSPITAL_COMMUNITY): Payer: Medicare Other

## 2022-06-21 DIAGNOSIS — T380X5A Adverse effect of glucocorticoids and synthetic analogues, initial encounter: Secondary | ICD-10-CM | POA: Diagnosis present

## 2022-06-21 DIAGNOSIS — E876 Hypokalemia: Secondary | ICD-10-CM

## 2022-06-21 DIAGNOSIS — Z950 Presence of cardiac pacemaker: Secondary | ICD-10-CM | POA: Diagnosis not present

## 2022-06-21 DIAGNOSIS — Z978 Presence of other specified devices: Secondary | ICD-10-CM | POA: Diagnosis not present

## 2022-06-21 DIAGNOSIS — R7989 Other specified abnormal findings of blood chemistry: Secondary | ICD-10-CM | POA: Diagnosis not present

## 2022-06-21 DIAGNOSIS — I495 Sick sinus syndrome: Secondary | ICD-10-CM | POA: Diagnosis present

## 2022-06-21 DIAGNOSIS — E1122 Type 2 diabetes mellitus with diabetic chronic kidney disease: Secondary | ICD-10-CM | POA: Diagnosis present

## 2022-06-21 DIAGNOSIS — K2270 Barrett's esophagus without dysplasia: Secondary | ICD-10-CM | POA: Diagnosis present

## 2022-06-21 DIAGNOSIS — E7800 Pure hypercholesterolemia, unspecified: Secondary | ICD-10-CM | POA: Diagnosis present

## 2022-06-21 DIAGNOSIS — I480 Paroxysmal atrial fibrillation: Secondary | ICD-10-CM | POA: Diagnosis present

## 2022-06-21 DIAGNOSIS — Z87891 Personal history of nicotine dependence: Secondary | ICD-10-CM | POA: Diagnosis not present

## 2022-06-21 DIAGNOSIS — Z7982 Long term (current) use of aspirin: Secondary | ICD-10-CM

## 2022-06-21 DIAGNOSIS — N179 Acute kidney failure, unspecified: Secondary | ICD-10-CM | POA: Diagnosis not present

## 2022-06-21 DIAGNOSIS — B9789 Other viral agents as the cause of diseases classified elsewhere: Secondary | ICD-10-CM | POA: Diagnosis present

## 2022-06-21 DIAGNOSIS — J188 Other pneumonia, unspecified organism: Secondary | ICD-10-CM

## 2022-06-21 DIAGNOSIS — I739 Peripheral vascular disease, unspecified: Secondary | ICD-10-CM | POA: Diagnosis not present

## 2022-06-21 DIAGNOSIS — N183 Chronic kidney disease, stage 3 unspecified: Secondary | ICD-10-CM | POA: Diagnosis present

## 2022-06-21 DIAGNOSIS — E669 Obesity, unspecified: Secondary | ICD-10-CM | POA: Diagnosis present

## 2022-06-21 DIAGNOSIS — E1165 Type 2 diabetes mellitus with hyperglycemia: Secondary | ICD-10-CM | POA: Diagnosis present

## 2022-06-21 DIAGNOSIS — J159 Unspecified bacterial pneumonia: Secondary | ICD-10-CM | POA: Diagnosis present

## 2022-06-21 DIAGNOSIS — J189 Pneumonia, unspecified organism: Secondary | ICD-10-CM | POA: Diagnosis not present

## 2022-06-21 DIAGNOSIS — Z6832 Body mass index (BMI) 32.0-32.9, adult: Secondary | ICD-10-CM

## 2022-06-21 DIAGNOSIS — K449 Diaphragmatic hernia without obstruction or gangrene: Secondary | ICD-10-CM | POA: Diagnosis not present

## 2022-06-21 DIAGNOSIS — I5032 Chronic diastolic (congestive) heart failure: Secondary | ICD-10-CM | POA: Diagnosis present

## 2022-06-21 DIAGNOSIS — I10 Essential (primary) hypertension: Secondary | ICD-10-CM | POA: Diagnosis present

## 2022-06-21 DIAGNOSIS — Z923 Personal history of irradiation: Secondary | ICD-10-CM | POA: Diagnosis not present

## 2022-06-21 DIAGNOSIS — J9601 Acute respiratory failure with hypoxia: Secondary | ICD-10-CM | POA: Diagnosis present

## 2022-06-21 DIAGNOSIS — J90 Pleural effusion, not elsewhere classified: Secondary | ICD-10-CM | POA: Diagnosis not present

## 2022-06-21 DIAGNOSIS — B348 Other viral infections of unspecified site: Secondary | ICD-10-CM

## 2022-06-21 DIAGNOSIS — Z1152 Encounter for screening for COVID-19: Secondary | ICD-10-CM

## 2022-06-21 DIAGNOSIS — Z95828 Presence of other vascular implants and grafts: Secondary | ICD-10-CM

## 2022-06-21 DIAGNOSIS — R079 Chest pain, unspecified: Secondary | ICD-10-CM | POA: Diagnosis not present

## 2022-06-21 DIAGNOSIS — J1289 Other viral pneumonia: Secondary | ICD-10-CM | POA: Diagnosis present

## 2022-06-21 DIAGNOSIS — R609 Edema, unspecified: Secondary | ICD-10-CM | POA: Diagnosis not present

## 2022-06-21 DIAGNOSIS — I4510 Unspecified right bundle-branch block: Secondary | ICD-10-CM | POA: Diagnosis not present

## 2022-06-21 DIAGNOSIS — R0902 Hypoxemia: Principal | ICD-10-CM

## 2022-06-21 DIAGNOSIS — G4730 Sleep apnea, unspecified: Secondary | ICD-10-CM | POA: Diagnosis present

## 2022-06-21 DIAGNOSIS — J9621 Acute and chronic respiratory failure with hypoxia: Secondary | ICD-10-CM | POA: Diagnosis not present

## 2022-06-21 DIAGNOSIS — K219 Gastro-esophageal reflux disease without esophagitis: Secondary | ICD-10-CM | POA: Diagnosis present

## 2022-06-21 DIAGNOSIS — J129 Viral pneumonia, unspecified: Secondary | ICD-10-CM

## 2022-06-21 DIAGNOSIS — R0603 Acute respiratory distress: Secondary | ICD-10-CM | POA: Diagnosis not present

## 2022-06-21 DIAGNOSIS — R069 Unspecified abnormalities of breathing: Secondary | ICD-10-CM | POA: Diagnosis not present

## 2022-06-21 DIAGNOSIS — N1832 Chronic kidney disease, stage 3b: Secondary | ICD-10-CM | POA: Diagnosis present

## 2022-06-21 DIAGNOSIS — R0601 Orthopnea: Secondary | ICD-10-CM | POA: Diagnosis not present

## 2022-06-21 DIAGNOSIS — R051 Acute cough: Secondary | ICD-10-CM

## 2022-06-21 DIAGNOSIS — I130 Hypertensive heart and chronic kidney disease with heart failure and stage 1 through stage 4 chronic kidney disease, or unspecified chronic kidney disease: Secondary | ICD-10-CM | POA: Diagnosis present

## 2022-06-21 DIAGNOSIS — I77819 Aortic ectasia, unspecified site: Secondary | ICD-10-CM | POA: Diagnosis present

## 2022-06-21 DIAGNOSIS — Z8546 Personal history of malignant neoplasm of prostate: Secondary | ICD-10-CM | POA: Diagnosis not present

## 2022-06-21 DIAGNOSIS — Z79899 Other long term (current) drug therapy: Secondary | ICD-10-CM

## 2022-06-21 DIAGNOSIS — Z743 Need for continuous supervision: Secondary | ICD-10-CM | POA: Diagnosis not present

## 2022-06-21 DIAGNOSIS — R001 Bradycardia, unspecified: Secondary | ICD-10-CM | POA: Diagnosis not present

## 2022-06-21 DIAGNOSIS — R059 Cough, unspecified: Secondary | ICD-10-CM | POA: Diagnosis not present

## 2022-06-21 DIAGNOSIS — I428 Other cardiomyopathies: Secondary | ICD-10-CM | POA: Diagnosis not present

## 2022-06-21 DIAGNOSIS — Z841 Family history of disorders of kidney and ureter: Secondary | ICD-10-CM

## 2022-06-21 DIAGNOSIS — Z7901 Long term (current) use of anticoagulants: Secondary | ICD-10-CM

## 2022-06-21 DIAGNOSIS — Z8249 Family history of ischemic heart disease and other diseases of the circulatory system: Secondary | ICD-10-CM

## 2022-06-21 DIAGNOSIS — J121 Respiratory syncytial virus pneumonia: Principal | ICD-10-CM | POA: Diagnosis present

## 2022-06-21 DIAGNOSIS — E1151 Type 2 diabetes mellitus with diabetic peripheral angiopathy without gangrene: Secondary | ICD-10-CM | POA: Diagnosis present

## 2022-06-21 DIAGNOSIS — R0989 Other specified symptoms and signs involving the circulatory and respiratory systems: Secondary | ICD-10-CM | POA: Diagnosis not present

## 2022-06-21 DIAGNOSIS — Z880 Allergy status to penicillin: Secondary | ICD-10-CM

## 2022-06-21 DIAGNOSIS — R0602 Shortness of breath: Secondary | ICD-10-CM | POA: Diagnosis not present

## 2022-06-21 DIAGNOSIS — R0609 Other forms of dyspnea: Secondary | ICD-10-CM | POA: Diagnosis not present

## 2022-06-21 LAB — CBC WITH DIFFERENTIAL/PLATELET
Abs Immature Granulocytes: 0.05 10*3/uL (ref 0.00–0.07)
Basophils Absolute: 0 10*3/uL (ref 0.0–0.1)
Basophils Relative: 0 %
Eosinophils Absolute: 0.3 10*3/uL (ref 0.0–0.5)
Eosinophils Relative: 3 %
HCT: 38.7 % — ABNORMAL LOW (ref 39.0–52.0)
Hemoglobin: 12.8 g/dL — ABNORMAL LOW (ref 13.0–17.0)
Immature Granulocytes: 1 %
Lymphocytes Relative: 11 %
Lymphs Abs: 1.1 10*3/uL (ref 0.7–4.0)
MCH: 30.8 pg (ref 26.0–34.0)
MCHC: 33.1 g/dL (ref 30.0–36.0)
MCV: 93 fL (ref 80.0–100.0)
Monocytes Absolute: 0.7 10*3/uL (ref 0.1–1.0)
Monocytes Relative: 7 %
Neutro Abs: 7.9 10*3/uL — ABNORMAL HIGH (ref 1.7–7.7)
Neutrophils Relative %: 78 %
Platelets: 231 10*3/uL (ref 150–400)
RBC: 4.16 MIL/uL — ABNORMAL LOW (ref 4.22–5.81)
RDW: 14.6 % (ref 11.5–15.5)
WBC: 10.1 10*3/uL (ref 4.0–10.5)
nRBC: 0 % (ref 0.0–0.2)

## 2022-06-21 LAB — HEPATIC FUNCTION PANEL
ALT: 16 U/L (ref 0–44)
AST: 21 U/L (ref 15–41)
Albumin: 2.9 g/dL — ABNORMAL LOW (ref 3.5–5.0)
Alkaline Phosphatase: 72 U/L (ref 38–126)
Bilirubin, Direct: 0.2 mg/dL (ref 0.0–0.2)
Indirect Bilirubin: 0.6 mg/dL (ref 0.3–0.9)
Total Bilirubin: 0.8 mg/dL (ref 0.3–1.2)
Total Protein: 7 g/dL (ref 6.5–8.1)

## 2022-06-21 LAB — TROPONIN I (HIGH SENSITIVITY)
Troponin I (High Sensitivity): 16 ng/L (ref <18)
Troponin I (High Sensitivity): 15 ng/L (ref <18)

## 2022-06-21 LAB — BASIC METABOLIC PANEL
Anion gap: 9 (ref 5–15)
BUN: 21 mg/dL (ref 8–23)
CO2: 27 mmol/L (ref 22–32)
Calcium: 8.7 mg/dL — ABNORMAL LOW (ref 8.9–10.3)
Chloride: 100 mmol/L (ref 98–111)
Creatinine, Ser: 1.98 mg/dL — ABNORMAL HIGH (ref 0.61–1.24)
GFR, Estimated: 32 mL/min — ABNORMAL LOW (ref >60)
Glucose, Bld: 117 mg/dL — ABNORMAL HIGH (ref 70–99)
Potassium: 3.2 mmol/L — ABNORMAL LOW (ref 3.5–5.1)
Sodium: 136 mmol/L (ref 135–145)

## 2022-06-21 LAB — PROCALCITONIN: Procalcitonin: 0.15 ng/mL

## 2022-06-21 LAB — RESP PANEL BY RT-PCR (FLU A&B, COVID) ARPGX2
Influenza A by PCR: NEGATIVE
Influenza B by PCR: NEGATIVE
SARS Coronavirus 2 by RT PCR: NEGATIVE

## 2022-06-21 LAB — STREP PNEUMONIAE URINARY ANTIGEN: Strep Pneumo Urinary Antigen: NEGATIVE

## 2022-06-21 LAB — MAGNESIUM: Magnesium: 2.2 mg/dL (ref 1.7–2.4)

## 2022-06-21 MED ORDER — METHYLPREDNISOLONE SODIUM SUCC 125 MG IJ SOLR
125.0000 mg | Freq: Once | INTRAMUSCULAR | Status: AC
  Administered 2022-06-21: 125 mg via INTRAVENOUS
  Filled 2022-06-21: qty 2

## 2022-06-21 MED ORDER — DILTIAZEM HCL ER COATED BEADS 240 MG PO CP24
240.0000 mg | ORAL_CAPSULE | Freq: Every day | ORAL | Status: DC
  Administered 2022-06-22 – 2022-07-03 (×12): 240 mg via ORAL
  Filled 2022-06-21 (×12): qty 1

## 2022-06-21 MED ORDER — IPRATROPIUM-ALBUTEROL 0.5-2.5 (3) MG/3ML IN SOLN
3.0000 mL | Freq: Once | RESPIRATORY_TRACT | Status: AC
  Administered 2022-06-21: 3 mL via RESPIRATORY_TRACT
  Filled 2022-06-21: qty 3

## 2022-06-21 MED ORDER — METOPROLOL TARTRATE 5 MG/5ML IV SOLN: 5.0000 mg | Freq: Four times a day (QID) | INTRAVENOUS | Status: DC | PRN

## 2022-06-21 MED ORDER — AMIODARONE HCL 200 MG PO TABS
200.0000 mg | ORAL_TABLET | Freq: Every day | ORAL | Status: DC
  Administered 2022-06-22 – 2022-06-26 (×5): 200 mg via ORAL
  Filled 2022-06-21 (×5): qty 1

## 2022-06-21 MED ORDER — ACETAMINOPHEN 325 MG PO TABS
650.0000 mg | ORAL_TABLET | Freq: Four times a day (QID) | ORAL | Status: DC | PRN
  Administered 2022-07-01: 650 mg via ORAL
  Filled 2022-06-21: qty 2

## 2022-06-21 MED ORDER — SODIUM CHLORIDE 0.9 % IV SOLN
500.0000 mg | INTRAVENOUS | Status: AC
  Administered 2022-06-21: 500 mg via INTRAVENOUS
  Filled 2022-06-21: qty 5

## 2022-06-21 MED ORDER — SODIUM CHLORIDE 0.9% FLUSH: 3.0000 mL | INTRAVENOUS | Status: DC | PRN

## 2022-06-21 MED ORDER — APIXABAN 2.5 MG PO TABS
2.5000 mg | ORAL_TABLET | Freq: Two times a day (BID) | ORAL | Status: DC
  Administered 2022-06-21 – 2022-07-03 (×24): 2.5 mg via ORAL
  Filled 2022-06-21 (×24): qty 1

## 2022-06-21 MED ORDER — SODIUM CHLORIDE 0.9 % IV SOLN
500.0000 mg | INTRAVENOUS | Status: AC
  Administered 2022-06-22 – 2022-06-26 (×5): 500 mg via INTRAVENOUS
  Filled 2022-06-21 (×5): qty 5

## 2022-06-21 MED ORDER — ROSUVASTATIN CALCIUM 5 MG PO TABS
10.0000 mg | ORAL_TABLET | Freq: Every day | ORAL | Status: DC
  Administered 2022-06-22 – 2022-07-03 (×12): 10 mg via ORAL
  Filled 2022-06-21 (×12): qty 2

## 2022-06-21 MED ORDER — SODIUM CHLORIDE 0.9 % IV SOLN: 1.0000 g | INTRAVENOUS | Status: DC

## 2022-06-21 MED ORDER — ACETAMINOPHEN 650 MG RE SUPP: 650.0000 mg | Freq: Four times a day (QID) | RECTAL | Status: DC | PRN

## 2022-06-21 MED ORDER — SODIUM CHLORIDE 0.9% FLUSH
3.0000 mL | Freq: Two times a day (BID) | INTRAVENOUS | Status: DC
  Administered 2022-06-22 – 2022-07-03 (×23): 3 mL via INTRAVENOUS

## 2022-06-21 MED ORDER — LEVALBUTEROL HCL 0.63 MG/3ML IN NEBU
0.6300 mg | INHALATION_SOLUTION | Freq: Four times a day (QID) | RESPIRATORY_TRACT | Status: DC | PRN
  Administered 2022-06-22 – 2022-06-24 (×2): 0.63 mg via RESPIRATORY_TRACT
  Filled 2022-06-21 (×2): qty 3

## 2022-06-21 MED ORDER — SODIUM CHLORIDE 0.9 % IV SOLN: 250.0000 mL | INTRAVENOUS | Status: DC | PRN

## 2022-06-21 MED ORDER — GUAIFENESIN ER 600 MG PO TB12
600.0000 mg | ORAL_TABLET | Freq: Two times a day (BID) | ORAL | Status: DC
  Administered 2022-06-21 – 2022-07-03 (×24): 600 mg via ORAL
  Filled 2022-06-21 (×24): qty 1

## 2022-06-21 MED ORDER — POTASSIUM CHLORIDE CRYS ER 20 MEQ PO TBCR
40.0000 meq | EXTENDED_RELEASE_TABLET | Freq: Once | ORAL | Status: AC
  Administered 2022-06-21: 40 meq via ORAL
  Filled 2022-06-21: qty 2

## 2022-06-21 MED ORDER — SODIUM CHLORIDE 0.9 % IV SOLN
1.0000 g | INTRAVENOUS | Status: DC
  Filled 2022-06-21: qty 10

## 2022-06-21 MED ORDER — ASPIRIN 81 MG PO TBEC
81.0000 mg | DELAYED_RELEASE_TABLET | Freq: Every day | ORAL | Status: DC
  Administered 2022-06-22 – 2022-07-03 (×12): 81 mg via ORAL
  Filled 2022-06-21 (×13): qty 1

## 2022-06-21 NOTE — Assessment & Plan Note (Signed)
Likely secondary to chronic lasix use  Check mag Replete and trend

## 2022-06-21 NOTE — Assessment & Plan Note (Signed)
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 He is s/p left SFA stent placement 05/17/21 by Dr. Trula Slade Continue ASA and crestor

## 2022-06-21 NOTE — Assessment & Plan Note (Addendum)
88 year old presenting with about one week history of worsening shortness of breath, cough and weakness found to be hypoxic on room air to 70%, requiring 5-6L oxygen to maintain saturation and findings on CXR suggestive of multifocal pneumonia.  -admit to progressive -no SIRS criteria/sepsis criteria -continue with rocephin and azithromycin -check urinary antigens -check RVP and PCT -covid/flu negative -IS to bedside -xoponex prn -check BNP, does not appear volume overloaded -mucinex BID -repeat 2V CXR in AM  -if no improvement consider CT as scarring seen on CXR -wean as tolerated

## 2022-06-21 NOTE — Assessment & Plan Note (Signed)
Unsure of baseline, labs from one year ago creatinine around 1.5 Hold lasix for now Strict I/O Trend  Daughter will try to get recent labs from pcp

## 2022-06-21 NOTE — Assessment & Plan Note (Signed)
In NSR with PPM Continue amiodarone, cardizem and eliquis

## 2022-06-21 NOTE — ED Notes (Signed)
Pt is on 5 lts of 02 via nasal cannula.

## 2022-06-21 NOTE — Assessment & Plan Note (Signed)
Foley catheter care

## 2022-06-21 NOTE — Assessment & Plan Note (Addendum)
a1c pending, doesn't recall being told he has diabetes A1C from 2019 was prediabetic range Repeat pending SSI and accuchecks qac/hs for now and f/u on a1c

## 2022-06-21 NOTE — ED Provider Notes (Signed)
  Physical Exam  BP (!) 175/71 (BP Location: Right Arm)   Pulse 64   Temp (!) 97.2 F (36.2 C) (Oral)   Resp 18   SpO2 95%   Physical Exam  Procedures  Procedures  ED Course / MDM   Clinical Course as of 06/21/22 1816  Wed Jun 21, 2022  1456 Assumed care from Dr Pearline Cables. 88 yo M with hx of AF, HTN, and HLD who presents with SOB and cough. Was in the 31s on RA initially. Comfortable on 4L [place]. Suspect viral vs bacterial PNA. Will require admission.  On my repeat exam is resting comfortably on 5 L.  Speaking in full sentences without accessory muscle use.  COVID and flu were negative so we will treat with ceftriaxone and azithromycin for suspected community-acquired pneumonia. [RP]  1703 Creatinine(!): 1.98 Elevated from baseline of 1.5. [RP]  1732 Dr Rogers Blocker has reviewed and requests BNP to be added on.  Has several other admissions but will call shortly. [RP]  2585 Discussed with Dr. Eliberto Ivory who has accepted the patient for admission. [RP]    Clinical Course User Index [RP] Fransico Meadow, MD   Medical Decision Making Amount and/or Complexity of Data Reviewed Labs: ordered. Decision-making details documented in ED Course. Radiology: ordered.  Risk Prescription drug management. Decision regarding hospitalization.   CRITICAL CARE Performed by: Fransico Meadow   Total critical care time: 30 minutes  Critical care time was exclusive of separately billable procedures and treating other patients.  Critical care was necessary to treat or prevent imminent or life-threatening deterioration.  Critical care was time spent personally by me on the following activities: development of treatment plan with patient and/or surrogate as well as nursing, discussions with consultants, evaluation of patient's response to treatment, examination of patient, obtaining history from patient or surrogate, ordering and performing treatments and interventions, ordering and review of laboratory studies,  ordering and review of radiographic studies, pulse oximetry and re-evaluation of patient's condition.    Fransico Meadow, MD 06/21/22 1816

## 2022-06-21 NOTE — H&P (Signed)
History and Physical    Patient: Ernest Parker TTS:[id number] DOB: February 10, 1934 DOA: 06/21/2022 DOS: the patient was seen and examined on 06/21/2022 PCP: Seward Carol, MD  Patient coming from: Home - lives with his daughter. Ambulates independently.    Chief Complaint: shortness of breath and weakness.   HPI: Ernest Parker is a 88 y.o. male with medical history significant of atrial fibrillation, CKD stage 3, GERD, HLD, HTN, sinus node dysfunction s/p PPM, prostate cancer s/p radiation, T2DM, chronic indwelling foley who presented to ED with complaints of shortness of breath.  He started to have a cough last Thursday and was given tessalon pearls. He was initially getting better, but then his cough was getting worse and he was more short of breath. His cough is dry, but feels like he may have some mucous that won't come out. Over the past 3 days he has been weak and tired. They went to see his PCP today and he was found to be hypoxic on room air to 69-70% and EMS was called. He has a runny nose and some congestion. NO sinus pain/pressure. He denies any sick contacts. He has been eating and drinking fine but his appetite isn't as good as it normally is.   Denies any fever/chills, vision changes/headaches, chest pain or palpitations, abdominal pain, N/V/D, dysuria or leg swelling.   He does not smoke or drink alcohol.   ER Course:  vitals: afebrile, bp: 163/70, HR: 65, RR: 22, oxygen: 76%RA Pertinent labs: covid/flu negative, potassium: 3.2, creatinine: 1.98,  CXR: significant interval increase in interstitial markings in both lungs, more so in the RUL field. Suggest multifocal pneumonia or interstitial edema.  In ED: given zithromax and rocephin, duoneb, and solumedrol. Potassium repleted. TRH asked to admit.    Review of Systems: As mentioned in the history of present illness. All other systems reviewed and are negative. Past Medical History:  Diagnosis Date   A-fib ([place])    Barrett's  esophagus    Dizzy resolved   Dyspnea    with exertion occ   GERD (gastroesophageal reflux disease)    Hard of hearing    wears hearing aids bilat   High cholesterol    Hypertension    Presence of permanent cardiac pacemaker 12/17/2017   Prostate cancer ([place]) 2002   S/P "radiation for 8-9 weeks"   SOB (shortness of breath)    Past Surgical History:  Procedure Laterality Date   ABDOMINAL AORTOGRAM W/LOWER EXTREMITY Bilateral 05/17/2021   Procedure: ABDOMINAL AORTOGRAM W/LOWER EXTREMITY;  Surgeon: Serafina [facility], MD;  Location: [place] [facility];  Service: Cardiovascular;  Laterality: Bilateral;   BALLOON DILATION N/A 09/05/2018   Procedure: BALLOON DILATION;  Surgeon: Milus Banister, MD;  Location: [facility];  Service: Endoscopy;  Laterality: N/A;   BILIARY STENT PLACEMENT  07/09/2018   Procedure: BILIARY STENT PLACEMENT;  Surgeon: Milus Banister, MD;  Location: [facility] ENDOSCOPY;  Service: Endoscopy;;   BILIARY STENT PLACEMENT N/A 09/05/2018   Procedure: BILIARY STENT PLACEMENT;  Surgeon: Milus Banister, MD;  Location: [facility]OS[facility];  Service: Endoscopy;  Laterality: N/A;   BIOPSY  07/05/2018   Procedure: BIOPSY;  Surgeon: Rush Landmark Telford Nab., MD;  Location: [facility] ENDOSCOPY;  Service: Gastroenterology;;   CHOLECYSTECTOMY     2020 late april or early may   ENDOSCOPIC RETROGRADE CHOLANGIOPANCREATOGRAPHY (ERCP) WITH PROPOFOL N/A 09/05/2018   Procedure: ENDOSCOPIC RETROGRADE CHOLANGIOPANCREATOGRAPHY (ERCP) WITH PROPOFOL;  Surgeon: Milus Banister, MD;  Location: [facility];  Service: Endoscopy;  Laterality: N/A;   ERCP N/A 07/09/2018   Procedure: ENDOSCOPIC RETROGRADE CHOLANGIOPANCREATOGRAPHY (ERCP);  Surgeon: Milus Banister, MD;  Location: [facility] ENDOSCOPY;  Service: Endoscopy;  Laterality: N/A;   ESOPHAGOGASTRODUODENOSCOPY (EGD) WITH PROPOFOL N/A 07/05/2018   Procedure: ESOPHAGOGASTRODUODENOSCOPY (EGD) WITH PROPOFOL;  Surgeon: Rush Landmark Telford Nab., MD;  Location: [place];   Service: Gastroenterology;  Laterality: N/A;   EUS  07/05/2018   Procedure: FULL UPPER ENDOSCOPIC ULTRASOUND (EUS) RADIAL;  Surgeon: Rush Landmark Telford Nab., MD;  Location: [place];  Service: Gastroenterology;;   INGUINAL HERNIA REPAIR Bilateral    INSERT / REPLACE / [place]  12/17/2017   IR CHOLANGIOGRAM EXISTING TUBE  09/10/2018   IR EXCHANGE BILIARY DRAIN  08/14/2018   IR EXCHANGE BILIARY DRAIN  10/08/2018   IR PERC CHOLECYSTOSTOMY  07/10/2018   PACEMAKER IMPLANT N/A 12/17/2017   Procedure: PACEMAKER IMPLANT;  Surgeon: Evans Lance, MD;  Location: [facility];  Service: Cardiovascular;  Laterality: N/A;   PERIPHERAL VASCULAR INTERVENTION Left 05/17/2021   Procedure: PERIPHERAL VASCULAR INTERVENTION;  Surgeon: Serafina [facility], MD;  Location: [place] [facility];  Service: Cardiovascular;  Laterality: Left;  superficial femoral   PROSTATE BIOPSY     REMOVAL OF STONES  07/09/2018   Procedure: REMOVAL OF STONES;  Surgeon: Milus Banister, MD;  Location[facility] ENDOSCOPY;  Service: Endoscopy;;   SPHINCTEROTOMY  07/09/2018   Procedure: [name];  Surgeon: Milus Banister, MD;  Location: [facility] ENDOSCOPY;  Service: Endoscopy;;   SPHINCTEROTOMY  09/05/2018   Procedure: [name];  Surgeon: Milus Banister, MD;  Location: [facility];  Service: Endoscopy;;   STENT REMOVAL  09/05/2018   Procedure: STENT REMOVAL;  Surgeon: Milus Banister, MD;  Location: [facility]OSCOPY;  Service: Endoscopy;;   STONE EXTRACTION WITH BASKET  09/05/2018   Procedure: STONE EXTRACTION WITH BASKET;  Surgeon: Milus Banister, MD;  Location: [facility] ENDOSCOPY;  Service: Endoscopy;;   THORACIC AORTIC ANEURYSM REPAIR  03/2018   TRANSURETHRAL RESECTION OF PROSTATE N/A 03/10/2019   Procedure: TRANSURETHRAL RESECTION OF THE PROSTATE (TURP);  Surgeon: Lucas Mallow, MD;  Location: [facility];  Service: Urology;  Laterality: N/A;   Social History:  reports that he quit smoking about 43 years ago. His  smoking use included cigarettes. He has a 15.00 pack-year smoking history. He has never used smokeless tobacco. He reports that he does not currently use alcohol after a past usage of about 1.0 standard drink of alcohol per week. He reports that he does not use drugs.  Allergies  Allergen Reactions   Penicillins Other (See Comments)    Patient can't remember reaction was told by mother he had a SERIOUS allergic rxn as a child  DID THE REACTION INVOLVE: Swelling of the face/tongue/throat, SOB, or low BP? Unknown Sudden or severe rash/hives, skin peeling, or the inside of the mouth or nose? Unknown Did it require medical treatment? Unknown When did it last happen?      Childhood allergy  If all above answers are "NO", may proceed with cephalosporin use.     Family History  Problem Relation Age of Onset   Hypertension Mother    Tuberculosis Father    Kidney disease Sister    Sleep apnea Sister     Prior to Admission medications   Medication Sig Start Date End Date Taking? Authorizing Provider  amiodarone (PACERONE) 200 MG tablet Take 1 tablet (200 mg total) by mouth daily. 08/31/21  Yes Evans Lance, MD  ASPIRIN [phone]  PO Take 81 mg by mouth daily.   Yes [provider]  benzonatate (TESSALON) 100 MG capsule Take 100 mg by mouth 3 (three) times daily. 06/15/22  Yes [provider]  Cholecalciferol (VITAMIN D3) 2000 units TABS Take 2,000 Units by mouth daily.    Yes [provider]  diltiazem (CARDIZEM CD) 240 MG 24 hr capsule TAKE 1 CAPSULE BY MOUTH EVERY DAY Patient taking differently: Take 240 mg by mouth daily. 04/07/22  Yes Evans Lance, MD  ELIQUIS 2.5 MG TABS tablet TAKE 1 TABLET BY MOUTH TWICE A DAY Patient taking differently: Take 2.5 mg by mouth 2 (two) times daily. 03/20/22  Yes Evans Lance, MD  furosemide (LASIX) 40 MG tablet TAKE 1 TABLET BY MOUTH TWICE A DAY Patient taking differently: Take 40 mg by mouth 2 (two) times daily. 12/14/21  Yes  Evans Lance, MD  Polyvinyl Alcohol-Povidone (REFRESH OP) Place 1 drop into both eyes 2 (two) times daily as needed (dry eyes).   Yes [provider]  rosuvastatin (CRESTOR) 10 MG tablet Take 10 mg by mouth daily. 01/13/22  Yes [provider]    Physical Exam: Vitals:   06/21/22 1545 06/21/22 1552 06/21/22 1730 06/21/22 1736  BP:  (!) 148/78 (!) 175/71 (!) 175/71  Pulse: (!) 59 63  64  Resp: (!) '23 17  18  '$ Temp:  97.8 F (36.6 C)  (!) 97.2 F (36.2 C)  TempSrc:  Oral  Oral  SpO2: 96% 96%  95%   General:  Appears calm and comfortable and is in NAD Eyes:  PERRL, EOMI, normal lids, iris ENT:  grossly normal hearing, lips & tongue, mmm; appropriate dentition Neck:  no LAD, masses or thyromegaly; no carotid bruits, no JVD Cardiovascular:  RRR, no m/r/g. No LE edema.  Respiratory:   right mid and lower lung crackles. No wheezing, no rhonchi. Normal effort.  Abdomen:  soft, NT, ND, NABS Back:   normal alignment, no CVAT Skin:  no rash or induration seen on limited exam Musculoskeletal:  grossly normal tone BUE/BLE, good ROM, no bony abnormality Lower extremity:  No LE edema.  Limited foot exam with no ulcerations.  2+ distal pulses. Psychiatric:  grossly normal mood and affect, speech fluent and appropriate, AOx3 Neurologic:  CN 2-12 grossly intact, moves all extremities in coordinated fashion, sensation intact   Radiological Exams on Admission: Independently reviewed - see discussion in A/P where applicable  DG Chest Portable 1 View  Result Date: 06/21/2022 CLINICAL DATA:  Shortness of breath EXAM: PORTABLE CHEST 1 VIEW COMPARISON:  Previous studies including the examination of 10/17/2021 FINDINGS: Transverse diameter of heart is increased. There is significant interval increase in interstitial markings scattered throughout both lungs. Left lateral CP angle is indistinct. There is no pneumothorax. Pacemaker battery is seen in the left infraclavicular region.  IMPRESSION: There is significant interval increase in interstitial markings in both lungs, more so in the right upper lung field. Findings suggest multifocal interstitial pneumonia or interstitial edema. Part of this finding suggests underlying scarring. Electronically Signed   By: Elmer Picker M.D.   On: 06/21/2022 14:55    EKG: Independently reviewed.  NSR with rate 67 with RBBB and LAFB; nonspecific ST changes with no evidence of acute ischemia   Labs on Admission: I have personally reviewed the available labs and imaging studies at the time of the admission.  Pertinent labs:   covid/flu negative,  potassium: 3.2,  creatinine: 1.98,  Assessment and Plan: Principal  Problem:   Acute respiratory failure with hypoxia secondary to multifocal pneumonia  Active Problems:   Hypokalemia   CKD stage 3b vs. acute on CKD    PAF (paroxysmal atrial fibrillation) (HCC)   Essential hypertension   PAD (peripheral artery disease) (HCC)   Type 2 diabetes mellitus with peripheral angiopathy (HCC)   Chronic indwelling Foley catheter   Multifocal pneumonia    Assessment and Plan: * Acute respiratory failure with hypoxia secondary to multifocal pneumonia  88 year old presenting with about one week history of worsening shortness of breath, cough and weakness found to be hypoxic on room air to 70%, requiring 5-6L oxygen to maintain saturation and findings on CXR suggestive of multifocal pneumonia.  -admit to progressive -no SIRS criteria/sepsis criteria -continue with rocephin and azithromycin -check urinary antigens -check RVP and PCT -covid/flu negative -IS to bedside -xoponex prn -check BNP, does not appear volume overloaded -mucinex BID -repeat 2V CXR in AM  -if no improvement consider CT as scarring seen on CXR -wean as tolerated   Hypokalemia Likely secondary to chronic lasix use  Check mag Replete and trend   CKD stage 3b vs. 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 He is s/p left SFA stent placement 05/17/21 by Dr. Trula Slade Continue ASA and crestor   Type 2 diabetes mellitus with peripheral angiopathy ([place]) a1c pending, doesn't recall being told he has diabetes A1C from 2019 was prediabetic range Repeat pending SSI and accuchecks qac/hs for now and f/u on a1c   Chronic indwelling Foley catheter Foley catheter care      Advance Care Planning:   Code Status: Full Code   Consults: none   DVT Prophylaxis: eliquis   Family Communication: daughter at bedside   Severity of Illness: The appropriate patient status for this patient is INPATIENT. Inpatient status is judged to be reasonable and necessary in order to provide the required intensity of service to ensure the patient's safety. The patient's presenting symptoms, physical exam findings, and initial radiographic and laboratory data in the context of their chronic comorbidities is felt to place them at high risk for further clinical deterioration. Furthermore, it is not anticipated that the patient will be medically stable for discharge from the hospital within 2 midnights of admission.   * I certify that at the point of admission it is my clinical judgment that the patient will require inpatient hospital care spanning beyond 2 midnights from the point of admission due to high intensity of service, high risk for further deterioration and high frequency of surveillance required.*  Author: Orma Flaming,  MD 06/21/2022 7:49 PM  For on call review [email or web address].

## 2022-06-21 NOTE — ED Provider Notes (Signed)
[facility] EMERGENCY DEPARTMENT Provider Note   CSN: [id number] Arrival date & time: 06/21/22  1400     History  Chief Complaint  Patient presents with   Shortness of Breath    Ernest Parker is a 88 y.o. male.  Patient is an 88 year old male with past medical history of hyperlipidemia, hypertension, A-fib  with cardiac pacemaker presenting for complaints of shortness of breath.  Patient admits to rhinorrhea and cough for the past week that is worsening in severity.  Patient states he woke this morning feeling significantly short of breath due to cough.  Per triage note, patient was satting at 72% on arrival nasal cannula.  Improved to 96% on 4 L.  Patient states he does not use home oxygen.  Denies any chest pain.  Denies any history of fevers, chills, vomiting, or diarrhea.  Denies recent antibiotic use.  Denies sick contacts.  He denies any lower extremity swelling or orthopnea.  Past medical history does include previous 30-pack-year use.  Patient has not smoked in 40 years.  The history is provided by the patient. No language interpreter was used.  Shortness of Breath Associated symptoms: no abdominal pain, no chest pain, no cough, no ear pain, no fever, no rash, no sore throat and no vomiting        Home Medications Prior to Admission medications   Medication Sig Start Date End Date Taking? Authorizing Provider  amiodarone (PACERONE) 200 MG tablet Take 1 tablet (200 mg total) by mouth daily. 08/31/21  Yes Evans Lance, MD  ASPIRIN 81 PO Take 81 mg by mouth daily.   Yes [provider]  benzonatate (TESSALON) 100 MG capsule Take 100 mg by mouth 3 (three) times daily. 06/15/22  Yes [provider]  Cholecalciferol (VITAMIN D3) 2000 units TABS Take 2,000 Units by mouth daily.    Yes [provider]  diltiazem (CARDIZEM CD) 240 MG 24 hr capsule TAKE 1 CAPSULE BY MOUTH EVERY DAY Patient taking differently: Take 240 mg by mouth  daily. 04/07/22  Yes Evans Lance, MD  ELIQUIS 2.5 MG TABS tablet TAKE 1 TABLET BY MOUTH TWICE A DAY Patient taking differently: Take 2.5 mg by mouth 2 (two) times daily. 03/20/22  Yes Evans Lance, MD  furosemide (LASIX) 40 MG tablet TAKE 1 TABLET BY MOUTH TWICE A DAY Patient taking differently: Take 40 mg by mouth 2 (two) times daily. 12/14/21  Yes Evans Lance, MD  Polyvinyl Alcohol-Povidone (REFRESH OP) Place 1 drop into both eyes 2 (two) times daily as needed (dry eyes).   Yes [provider]  rosuvastatin (CRESTOR) 10 MG tablet Take 10 mg by mouth daily. 01/13/22  Yes [provider]      Allergies    Penicillins    Review of Systems   Review of Systems  Constitutional:  Negative for chills and fever.  HENT:  Negative for ear pain and sore throat.   Eyes:  Negative for pain and visual disturbance.  Respiratory:  Positive for shortness of breath. Negative for cough.   Cardiovascular:  Negative for chest pain and palpitations.  Gastrointestinal:  Negative for abdominal pain and vomiting.  Genitourinary:  Negative for dysuria and hematuria.  Musculoskeletal:  Negative for arthralgias and back pain.  Skin:  Negative for color change and rash.  Neurological:  Negative for seizures and syncope.  All other systems reviewed and are negative.   Physical Exam Updated Vital Signs BP (!) 166/100 (BP Location:  Right Arm)   Pulse 69   Temp 98 F (36.7 C) (Oral)   Resp (!) 24   SpO2 94%  Physical Exam Vitals and nursing note reviewed.  Constitutional:      General: He is not in acute distress.    Appearance: He is well-developed.  HENT:     Head: Normocephalic and atraumatic.  Eyes:     Conjunctiva/sclera: Conjunctivae normal.  Cardiovascular:     Rate and Rhythm: Normal rate and regular rhythm.     Heart sounds: No murmur heard. Pulmonary:     Effort: Tachypnea and respiratory distress present.     Breath sounds: Decreased breath sounds present.   Abdominal:     Palpations: Abdomen is soft.     Tenderness: There is no abdominal tenderness.  Musculoskeletal:        General: No swelling.     Cervical back: Neck supple.  Skin:    General: Skin is warm and dry.     Capillary Refill: Capillary refill takes less than 2 seconds.  Neurological:     Mental Status: He is alert.  Psychiatric:        Mood and Affect: Mood normal.     ED Results / Procedures / Treatments   Labs (all labs ordered are listed, but only abnormal results are displayed) Labs Reviewed  RESP PANEL BY RT-PCR (FLU A&B, COVID) [id number]  CBC WITH DIFFERENTIAL/PLATELET  BASIC METABOLIC PANEL  TROPONIN I (HIGH SENSITIVITY)    EKG EKG Interpretation  Date/Time:  Wednesday June 21 2022 14:10:34 EDT Ventricular Rate:  67 PR Interval:  184 QRS Duration: 170 QT Interval:  432 QTC Calculation: 457 R Axis:   112 Text Interpretation: Sinus rhythm RBBB and LPFB Confirmed by Campbell Stall ([id number]) on 01/60/1093 2:59:08 PM  Radiology DG Chest Portable 1 View  Result Date: 06/21/2022 CLINICAL DATA:  Shortness of breath EXAM: PORTABLE CHEST 1 VIEW COMPARISON:  Previous studies including the examination of 10/17/2021 FINDINGS: Transverse diameter of heart is increased. There is significant interval increase in interstitial markings scattered throughout both lungs. Left lateral CP angle is indistinct. There is no pneumothorax. Pacemaker battery is seen in the left infraclavicular region. IMPRESSION: There is significant interval increase in interstitial markings in both lungs, more so in the right upper lung field. Findings suggest multifocal interstitial pneumonia or interstitial edema. Part of this finding suggests underlying scarring. Electronically Signed   By: Elmer Picker M.D.   On: 06/21/2022 14:55    Procedures .Critical Care  Performed by: Lianne Cure, DO Authorized by: Lianne Cure, DO   Critical care provider statement:    Critical care time  (minutes):  30   Critical care was necessary to treat or prevent imminent or life-threatening deterioration of the following conditions:  Respiratory failure   Critical care was time spent personally by me on the following activities:  Development of treatment plan with patient or surrogate, discussions with consultants, evaluation of patient's response to treatment, examination of patient, ordering and review of laboratory studies, ordering and review of radiographic studies, ordering and performing treatments and interventions, pulse oximetry, re-evaluation of patient's condition and review of old charts     Medications Ordered in ED Medications  ipratropium-albuterol (DUONEB) 0.5-2.5 (3) MG/3ML nebulizer solution 3 mL (has no administration in time range)  methylPREDNISolone sodium succinate (SOLU-MEDROL) 125 mg/2 mL injection 125 mg (has no administration in time range)    ED Course/ Medical Decision Making/ A&P Clinical Course as of  06/21/22 1501  Wed Jun 21, 2022  1456 Assumed care from Dr Pearline Cables. 88 yo M with hx of AF, HTN, and HLD who presents with SOB and cough. Was in the 28s on RA initially. Comfortable on 4L [place]. Suspect viral vs bacterial PNA. Will require admission.  [RP]    Clinical Course User Index [RP] Fransico Meadow, MD                           Medical Decision Making Amount and/or Complexity of Data Reviewed Labs: ordered. Radiology: ordered.  Risk Prescription drug management.   63:59 PM 88 year old male with past medical history of hyperlipidemia, hypertension, A-fib  with cardiac pacemaker presenting for complaints of shortness of breath.  Patient is alert and oriented x3, no acute distress, afebrile, stable vital signs.  Patient was reported to be satting at 72% on room air.  Patient currently stable 96 on 4 L nasal cannula.  Patient has equal bilateral breath sounds with are diminished bilaterally.  Is a non-smoker currently but smoked for approximately 30  years.  DuoNeb treatment ordered with Solu-Medrol.  Concerns for likely viral URI with cough exacerbating COPD symptoms versus pneumonia.  Patient signed out to oncoming physician while awaiting laboratory work-up and chest x-ray.        Final Clinical Impression(s) / ED Diagnoses Final diagnoses:  Hypoxia  Acute cough    Rx / DC Orders ED Discharge Orders     None         Lianne Cure, DO 71/16/57 1501

## 2022-06-21 NOTE — ED Triage Notes (Signed)
Pt arrives via [facility] from [facility] office for persistent cough and SOB. Pt oxygen saturation was 72% initially at office and was placed on 4 lpm O2 via [place] and improved to 96. Pt denies any CP currently. No hx of fevers recently. VSS enroute

## 2022-06-21 NOTE — Assessment & Plan Note (Signed)
Continue cardizem IV lopressor PRN for systolic >584

## 2022-06-22 ENCOUNTER — Inpatient Hospital Stay (HOSPITAL_COMMUNITY): Payer: Medicare Other

## 2022-06-22 ENCOUNTER — Other Ambulatory Visit: Payer: Self-pay

## 2022-06-22 ENCOUNTER — Encounter (HOSPITAL_COMMUNITY): Payer: Self-pay | Admitting: Family Medicine

## 2022-06-22 DIAGNOSIS — I4510 Unspecified right bundle-branch block: Secondary | ICD-10-CM

## 2022-06-22 DIAGNOSIS — Z978 Presence of other specified devices: Secondary | ICD-10-CM | POA: Diagnosis not present

## 2022-06-22 DIAGNOSIS — J189 Pneumonia, unspecified organism: Secondary | ICD-10-CM

## 2022-06-22 DIAGNOSIS — N1832 Chronic kidney disease, stage 3b: Secondary | ICD-10-CM

## 2022-06-22 DIAGNOSIS — I10 Essential (primary) hypertension: Secondary | ICD-10-CM

## 2022-06-22 DIAGNOSIS — I428 Other cardiomyopathies: Secondary | ICD-10-CM | POA: Diagnosis not present

## 2022-06-22 DIAGNOSIS — J9601 Acute respiratory failure with hypoxia: Secondary | ICD-10-CM | POA: Diagnosis not present

## 2022-06-22 DIAGNOSIS — I480 Paroxysmal atrial fibrillation: Secondary | ICD-10-CM

## 2022-06-22 DIAGNOSIS — R0603 Acute respiratory distress: Secondary | ICD-10-CM | POA: Diagnosis not present

## 2022-06-22 DIAGNOSIS — I739 Peripheral vascular disease, unspecified: Secondary | ICD-10-CM

## 2022-06-22 DIAGNOSIS — E876 Hypokalemia: Secondary | ICD-10-CM

## 2022-06-22 DIAGNOSIS — E1151 Type 2 diabetes mellitus with diabetic peripheral angiopathy without gangrene: Secondary | ICD-10-CM

## 2022-06-22 LAB — RESPIRATORY PANEL BY PCR

## 2022-06-22 LAB — BASIC METABOLIC PANEL
Anion gap: 9 (ref 5–15)
BUN: 28 mg/dL — ABNORMAL HIGH (ref 8–23)
CO2: 26 mmol/L (ref 22–32)
Calcium: 9.1 mg/dL (ref 8.9–10.3)
Chloride: 103 mmol/L (ref 98–111)
Creatinine, Ser: 1.82 mg/dL — ABNORMAL HIGH (ref 0.61–1.24)
GFR, Estimated: 36 mL/min — ABNORMAL LOW (ref >60)
Glucose, Bld: 105 mg/dL — ABNORMAL HIGH (ref 70–99)
Potassium: 4.2 mmol/L (ref 3.5–5.1)
Sodium: 138 mmol/L (ref 135–145)

## 2022-06-22 LAB — ECHOCARDIOGRAM COMPLETE
AR max vel: 2.69 cm2
AV Area VTI: 2.75 cm2
AV Area mean vel: 2.72 cm2
AV Mean grad: 4 mmHg
AV Peak grad: 7.2 mmHg
Ao pk vel: 1.34 m/s
Area-P 1/2: 2.48 cm2
S' Lateral: 3.8 cm

## 2022-06-22 LAB — BRAIN NATRIURETIC PEPTIDE: B Natriuretic Peptide: 199.6 pg/mL — ABNORMAL HIGH (ref 0.0–100.0)

## 2022-06-22 LAB — CBC
HCT: 34.1 % — ABNORMAL LOW (ref 39.0–52.0)
Hemoglobin: 11.7 g/dL — ABNORMAL LOW (ref 13.0–17.0)
MCH: 31.3 pg (ref 26.0–34.0)
MCHC: 34.3 g/dL (ref 30.0–36.0)
MCV: 91.2 fL (ref 80.0–100.0)
Platelets: 219 10*3/uL (ref 150–400)
RBC: 3.74 MIL/uL — ABNORMAL LOW (ref 4.22–5.81)
RDW: 14.3 % (ref 11.5–15.5)
WBC: 6.3 10*3/uL (ref 4.0–10.5)
nRBC: 0 % (ref 0.0–0.2)

## 2022-06-22 MED ORDER — CHLORHEXIDINE GLUCONATE CLOTH 2 % EX PADS
6.0000 | MEDICATED_PAD | Freq: Every day | CUTANEOUS | Status: DC
  Administered 2022-06-22 – 2022-07-03 (×12): 6 via TOPICAL

## 2022-06-22 MED ORDER — SODIUM CHLORIDE 0.9 % IV SOLN
1.0000 g | INTRAVENOUS | Status: AC
  Administered 2022-06-22 – 2022-06-26 (×5): 1 g via INTRAVENOUS
  Filled 2022-06-22 (×5): qty 10

## 2022-06-22 NOTE — ED Notes (Signed)
Daughter [name] [phone] would like an update asap and to know how he did all night

## 2022-06-22 NOTE — ED Notes (Signed)
Respiratory panel added on with lab

## 2022-06-22 NOTE — Progress Notes (Incomplete)
  Echocardiogram 2D Echocardiogram has been performed.  Ronny Flurry 06/22/2022, 3:58 PM

## 2022-06-22 NOTE — Progress Notes (Signed)
Progress Note  Patient: Ernest Parker RKY:[id number] DOB: 02-29-1934  DOA: 06/21/2022  DOS: 06/22/2022    Brief hospital course: VIET KEMMERER is an 88 y.o. male with a history of AFib, stage III CKD, HTN, HLD, SSS s/p PPM, prostate CA s/p XRT, T2DM, chronic indwelling foley, remote tobacco use, and GERD who presented to the ED with increasing shortness of breath in the setting of rhinorrhea, congestion and cough associated with diffuse weakness. He was found to be hypoxemic with multifocal pneumonia detailed on CXR. Subsequent virus panel positive for rhinovirus.   Assessment and Plan: Acute hypoxic respiratory failure due to rhinovirus multifocal pneumonia:  - Continue supplemental oxygen, wean as tolerated.  - RVP added on +rhinovirus, neg covid, flu. PCT 0.15, WBC normal. Will treat supportively and while needing HFNC O2 continue abx with the possibility, albeit low, of superimposed bacterial PNA.  - Sputum Cx - Given high dose steroid initially. He does have remote smoking hx (20 pack years) but no wheezing or Dx COPD. We will give prn bronchodilators and restart steroids if any wheezing develops.  - DDx of multifocal infiltrate includes edema. Check BNP, echo.   Hypokalemia: Resolved with supplementation.  - Monitor.   CKD stage 3b vs. acute on CKD  - Holding lasix today as he appears euvolemic. If respiratory difficulties worsen, low threshold to empirically trial IV lasix.   PAF, sinus node dysfunction s/p PPM: In paced/NSR. 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 He is s/p left SFA stent placement 05/17/21 by Dr. Trula Slade - Continue ASA and crestor   NIDT2DM: HbA1c 5.7%.  - SSI   HLD:  - Continue statin  Hx prostate CA s/p XRT,  chronic indwelling foley catheter - Foley catheter care   Obesity: Estimated body mass index is 30.85 kg/m as calculated from the following:   Height as of 05/30/22: '5\' 10"'$  (1.778 m).   Weight as of 05/30/22: 97.5 kg.  Subjective: Breathing better than on arrival. Was not and is not wheezing. No chest pain. Has not gotten OOB yet.   Objective: Vitals:   06/22/22 1144 06/22/22 1300 06/22/22 1434 06/22/22 1732  BP:  (!) 150/70 (!) 130/108 (!) 155/66  Pulse:  60 60 60  Resp:  '16 16 19  '$ Temp:   97.6 F (36.4 C) 97.6 F (36.4 C)  TempSrc:   Oral Oral  SpO2: 94% 93% 92% (!) 89%   Gen: Pleasant, alert elderly male in no distress Pulm: Nonlabored breathing 5-6L O2, diffuse nondependent crackles scant, no wheezes.  CV: Regular rate and rhythm. No murmur, rub, or gallop. No JVD, no dependent edema. GI: Abdomen soft, non-tender, non-distended, with normoactive bowel sounds.  Ext: Warm, no deformities Skin: No rashes, lesions or ulcers on visualized skin. Neuro: Alert and oriented. No focal neurological deficits. Psych: Judgement and insight appear fair. Mood euthymic & affect congruent. Behavior is appropriate.    Data Personally reviewed: CBC: Recent Labs  Lab 06/21/22 1526 06/22/22 0430  WBC 10.1 6.3  NEUTROABS 7.9*  --   HGB 12.8* 11.7*  HCT 38.7* 34.1*  MCV 93.0 91.2  PLT 231 176   Basic Metabolic Panel: Recent Labs  Lab 06/21/22 1526 06/21/22 2036 06/22/22 1606  NA 136  --  138  K 3.2*  --  4.2  CL 100  --  103  CO2 27  --  26  GLUCOSE 117*  --  105*  BUN 21  --  28*  CREATININE 1.98*  --  1.82*  CALCIUM 8.7*  --  9.1  MG  --  2.2  --    GFR: CrCl cannot be calculated (Unknown ideal weight.). Liver Function Tests: Recent Labs  Lab 06/21/22 2036  AST 21  ALT 16  ALKPHOS 72  BILITOT 0.8  PROT 7.0  ALBUMIN 2.9*   No results for input(s): "LIPASE", "AMYLASE" in the last 168 hours. No results for input(s): "AMMONIA" in the last 168 hours. Coagulation  Profile: No results for input(s): "INR", "PROTIME" in the last 168 hours. Cardiac Enzymes: No results for input(s): "CKTOTAL", "CKMB", "CKMBINDEX", "TROPONINI" in the last 168 hours. BNP (last 3 results) No results for input(s): "PROBNP" in the last 8760 hours. HbA1C: No results for input(s): "HGBA1C" in the last 72 hours. CBG: No results for input(s): "GLUCAP" in the last 168 hours. Lipid Profile: No results for input(s): "CHOL", "HDL", "LDLCALC", "TRIG", "CHOLHDL", "LDLDIRECT" in the last 72 hours. Thyroid Function Tests: No results for input(s): "TSH", "T4TOTAL", "FREET4", "T3FREE", "THYROIDAB" in the last 72 hours. Anemia Panel: No results for input(s): "VITAMINB12", "FOLATE", "FERRITIN", "TIBC", "IRON", "RETICCTPCT" in the last 72 hours. Urine analysis:    Component Value Date/Time   COLORURINE YELLOW 05/18/2021 1114   APPEARANCEUR TURBID (A) 05/18/2021 1114   LABSPEC 1.010 05/18/2021 1114   PHURINE 8.5 (H) 05/18/2021 1114   GLUCOSEU NEGATIVE 05/18/2021 1114   HGBUR LARGE (A) 05/18/2021 1114   BILIRUBINUR NEGATIVE 05/18/2021 1114   KETONESUR NEGATIVE 05/18/2021 1114   PROTEINUR 100 (A) 05/18/2021 1114   NITRITE NEGATIVE 05/18/2021 1114   LEUKOCYTESUR MODERATE (A) 05/18/2021 1114   Recent Results (from the past 240 hour(s))  Resp Panel by RT-PCR (Flu A&B, Covid) Anterior Nasal Swab     Status: None   Collection Time: 06/21/22 12:24 PM   Specimen: Anterior Nasal Swab  Result Value Ref Range Status   SARS Coronavirus 2 by RT PCR NEGATIVE NEGATIVE Final    Comment: (NOTE) SARS-CoV-2 target nucleic acids are NOT DETECTED.  The SARS-CoV-2 RNA is generally detectable in upper respiratory specimens during the acute phase of infection. The lowest concentration of SARS-CoV-2 viral copies this assay can detect is 138 copies/mL. A negative result does not preclude SARS-Cov-2 infection and should not be used as the sole basis for treatment or other patient management decisions. A  negative result may occur with  improper specimen collection/handling, submission of specimen other than nasopharyngeal swab, presence of viral mutation(s) within the areas targeted by this assay, and inadequate number of viral copies(<138 copies/mL). A negative result must be combined with clinical observations, patient history, and epidemiological information. The expected result is Negative.  Fact Sheet for Patients:  EntrepreneurPulse.com.au  Fact Sheet for Healthcare Providers:  IncredibleEmployment.be  This test is no t yet approved or cleared by the [place] FDA and  has been authorized for detection and/or diagnosis of SARS-CoV-2 by FDA under an Emergency Use Authorization (EUA). This EUA will remain  in effect (meaning this test can be used) for the duration of the COVID-19 declaration under Section 564(b)(1) of the Act, 21 U.S.C.section 360bbb-3(b)(1), unless the authorization is terminated  or revoked sooner.       Influenza A by PCR NEGATIVE NEGATIVE Final   Influenza B by PCR NEGATIVE NEGATIVE Final  Comment: (NOTE) The Xpert Xpress SARS-CoV-2/FLU/RSV plus assay is intended as an aid in the diagnosis of influenza from Nasopharyngeal swab specimens and should not be used as a sole basis for treatment. Nasal washings and aspirates are unacceptable for Xpert Xpress SARS-CoV-2/FLU/RSV testing.  Fact Sheet for Patients: EntrepreneurPulse.com.au  Fact Sheet for Healthcare Providers: IncredibleEmployment.be  This test is not yet approved or cleared by the [place] FDA and has been authorized for detection and/or diagnosis of SARS-CoV-2 by FDA under an Emergency Use Authorization (EUA). This EUA will remain in effect (meaning this test can be used) for the duration of the COVID-19 declaration under Section 564(b)(1) of the Act, 21 U.S.C. section 360bbb-3(b)(1), unless the authorization  is terminated or revoked.  Performed at [facility], [place] [address], [place], [place] [address]   Respiratory (~20 pathogens) panel by PCR     Status: Abnormal   Collection Time: 06/21/22  7:34 PM   Specimen: Nasopharyngeal Swab; Respiratory  Result Value Ref Range Status   Adenovirus NOT DETECTED NOT DETECTED Final   Coronavirus 229E NOT DETECTED NOT DETECTED Final    Comment: (NOTE) The Coronavirus on the Respiratory Panel, DOES NOT test for the novel  Coronavirus (2019 nCoV)    Coronavirus HKU1 NOT DETECTED NOT DETECTED Final   Coronavirus NL63 NOT DETECTED NOT DETECTED Final   Coronavirus OC43 NOT DETECTED NOT DETECTED Final   Metapneumovirus NOT DETECTED NOT DETECTED Final   Rhinovirus / Enterovirus DETECTED (A) NOT DETECTED Final   Influenza A NOT DETECTED NOT DETECTED Final   Influenza B NOT DETECTED NOT DETECTED Final   Parainfluenza Virus 1 NOT DETECTED NOT DETECTED Final   Parainfluenza Virus 2 NOT DETECTED NOT DETECTED Final   Parainfluenza Virus 3 NOT DETECTED NOT DETECTED Final   Parainfluenza Virus 4 NOT DETECTED NOT DETECTED Final   Respiratory Syncytial Virus NOT DETECTED NOT DETECTED Final   Bordetella pertussis NOT DETECTED NOT DETECTED Final   Bordetella Parapertussis NOT DETECTED NOT DETECTED Final   Chlamydophila pneumoniae NOT DETECTED NOT DETECTED Final   Mycoplasma pneumoniae NOT DETECTED NOT DETECTED Final    Comment: Performed at [facility], [place]. [address] [place], [place] [address]     DG Chest 2 View  Result Date: 06/22/2022 CLINICAL DATA:  Shortness of breath EXAM: CHEST - 2 VIEW COMPARISON:  Yesterday FINDINGS: Lateral view is moderately motion degraded, essentially nondiagnostic. Pacer with leads at right atrium and right ventricle. No lead discontinuity. Midline trachea. Mild cardiomegaly. No pleural effusion or pneumothorax. Peripheral predominant interstitial thickening is similar to yesterday but increased from 10/17/2021. No  well-defined lobar consolidation. IMPRESSION: No significant change since 1 day prior. Peripheral predominant interstitial thickening likely represents mild interstitial edema. Atypical infection could look similar. Electronically Signed   By: Abigail Miyamoto M.D.   On: 06/22/2022 08:19   DG Chest Portable 1 View  Result Date: 06/21/2022 CLINICAL DATA:  Shortness of breath EXAM: PORTABLE CHEST 1 VIEW COMPARISON:  Previous studies including the examination of 10/17/2021 FINDINGS: Transverse diameter of heart is increased. There is significant interval increase in interstitial markings scattered throughout both lungs. Left lateral CP angle is indistinct. There is no pneumothorax. Pacemaker battery is seen in the left infraclavicular region. IMPRESSION: There is significant interval increase in interstitial markings in both lungs, more so in the right upper lung field. Findings suggest multifocal interstitial pneumonia or interstitial edema. Part of this finding suggests underlying scarring. Electronically Signed   By: Elmer Picker  M.D.   On: 06/21/2022 14:55    Family Communication: Daughter at bedside  Disposition: Status is: Inpatient Remains inpatient appropriate because: Persistent hypoxia Planned Discharge Destination: Home  Patrecia Pour, MD 06/22/2022 6:13 PM Page by [email or web address]

## 2022-06-22 NOTE — ED Notes (Signed)
ED TO INPATIENT HANDOFF REPORT  ED Nurse Name and Phone #: [phone]  S Name/Age/Gender Ernest Parker 88 y.o. male Room/Bed: 021C/021C  Code Status   Code Status: Full Code  Home/SNF/Other Home Patient oriented to: self, place, time, and situation Is this baseline? Yes   Triage Complete: Triage complete  Chief Complaint Acute respiratory failure with hypoxia ([place]) [J96.01]  Triage Note Pt arrives via [facility] from [place] office for persistent cough and SOB. Pt oxygen saturation was 72% initially at office and was placed on 4 lpm O2 via [place] and improved to 96. Pt denies any CP currently. No hx of fevers recently. VSS enroute   Allergies Allergies  Allergen Reactions   Penicillins Other (See Comments)    Patient can't remember reaction was told by mother he had a SERIOUS allergic rxn as a child  DID THE REACTION INVOLVE: Swelling of the face/tongue/throat, SOB, or low BP? Unknown Sudden or severe rash/hives, skin peeling, or the inside of the mouth or nose? Unknown Did it require medical treatment? Unknown When did it last happen?      Childhood allergy  If all above answers are "NO", may proceed with cephalosporin use.     Level of Care/Admitting Diagnosis ED Disposition     ED Disposition  Admit   Condition  --   [place]: [place] [[id number]]  Level of Care: Progressive [102]  Admit to Progressive based on following criteria: RESPIRATORY PROBLEMS hypoxemic/hypercapnic respiratory failure that is responsive to NIPPV (BiPAP) or High Flow Nasal Cannula (6-80 lpm). Frequent assessment/intervention, no > Q2 hrs < Q4 hrs, to maintain oxygenation and pulmonary hygiene.  May admit patient to [facility] or [facility] if equivalent level of care is available:: No  Covid Evaluation: Confirmed COVID Negative  Diagnosis: Acute respiratory failure with hypoxia [facility]) [[id number]]  Admitting Physician: Orma Flaming [[id number]]  Attending  Physician: Orma Flaming [[id number]]  Certification:: I certify this patient will need inpatient services for at least 2 midnights  Estimated Length of Stay: 3          B Medical/Surgery History Past Medical History:  Diagnosis Date   A-fib ([place])    Barrett's esophagus    Dizzy resolved   Dyspnea    with exertion occ   GERD (gastroesophageal reflux disease)    Hard of hearing    wears hearing aids bilat   High cholesterol    Hypertension    Presence of permanent cardiac pacemaker 12/17/2017   Prostate cancer ([place]) 2002   S/P "radiation for 8-9 weeks"   SOB (shortness of breath)    Past Surgical History:  Procedure Laterality Date   ABDOMINAL AORTOGRAM W/LOWER EXTREMITY Bilateral 05/17/2021   Procedure: ABDOMINAL AORTOGRAM W/LOWER EXTREMITY;  Surgeon: Serafina Mitchell, MD;  Location: [place] [facility];  Service: Cardiovascular;  Laterality: Bilateral;   BALLOON DILATION N/A 09/05/2018   Procedure: BALLOON DILATION;  Surgeon: Milus Banister, MD;  Location: [facility];  Service: Endoscopy;  Laterality: N/A;   BILIARY STENT PLACEMENT  07/09/2018   Procedure: BILIARY STENT PLACEMENT;  Surgeon: Milus Banister, MD;  Location: [facility] ENDOSCOPY;  Service: Endoscopy;;   BILIARY STENT PLACEMENT N/A 09/05/2018   Procedure: BILIARY STENT PLACEMENT;  Surgeon: Milus Banister, MD;  [facility];  Service: Endoscopy;  Laterality: N/A;   BIOPSY  07/05/2018   Procedure: BIOPSY;  Surgeon: Rush Landmark Telford Nab., MD;  Location: [place];  Service: Gastroenterology;;   CHOLECYSTECTOMY  2020 late april or early may   ENDOSCOPIC RETROGRADE CHOLANGIOPANCREATOGRAPHY (ERCP) WITH PROPOFOL N/A 09/05/2018   Procedure: ENDOSCOPIC RETROGRADE CHOLANGIOPANCREATOGRAPHY (ERCP) WITH PROPOFOL;  Surgeon: Milus Banister, MD;  [facility];  Service: Endoscopy;  Laterality: N/A;   ERCP N/A 07/09/2018   Procedure: ENDOSCOPIC RETROGRADE CHOLANGIOPANCREATOGRAPHY (ERCP);  Surgeon:  Milus Banister, MD;  Location: [facility] ENDOSCOPY;  Service: Endoscopy;  Laterality: N/A;   ESOPHAGOGASTRODUODENOSCOPY (EGD) WITH PROPOFOL N/A 07/05/2018   Procedure: ESOPHAGOGASTRODUODENOSCOPY (EGD) WITH PROPOFOL;  Surgeon: Rush Landmark Telford Nab., MD;  Location: [place];  Service: Gastroenterology;  Laterality: N/A;   EUS  07/05/2018   Procedure: FULL UPPER ENDOSCOPIC ULTRASOUND (EUS) RADIAL;  Surgeon: Rush Landmark Telford Nab., MD;  Location: [place];  Service: Gastroenterology;;   INGUINAL HERNIA REPAIR Bilateral    INSERT / REPLACE / [place]  12/17/2017   IR CHOLANGIOGRAM EXISTING TUBE  09/10/2018   IR EXCHANGE BILIARY DRAIN  08/14/2018   IR EXCHANGE BILIARY DRAIN  10/08/2018   IR PERC CHOLECYSTOSTOMY  07/10/2018   PACEMAKER IMPLANT N/A 12/17/2017   Procedure: PACEMAKER IMPLANT;  Surgeon: Evans Lance, MD;  Location: [place] [facility];  Service: Cardiovascular;  Laterality: N/A;   PERIPHERAL VASCULAR INTERVENTION Left 05/17/2021   Procedure: PERIPHERAL VASCULAR INTERVENTION;  Surgeon: Serafina Mitchell, MD;  Location: [place] [facility];  Service: Cardiovascular;  Laterality: Left;  superficial femoral   PROSTATE BIOPSY     REMOVAL OF STONES  07/09/2018   Procedure: REMOVAL OF STONES;  Surgeon: Milus Banister, MD;  Location: [facility] ENDOSCOPY;  Service: Endoscopy;;   SPHINCTEROTOMY  07/09/2018   Procedure: [name];  Surgeon: Milus Banister, MD;  Location: [facility] ENDOSCOPY;  Service: Endoscopy;;   SPHINCTEROTOMY  09/05/2018   Procedure: [name];  Surgeon: Milus Banister, MD;  [facility];  Service: Endoscopy;;   STENT REMOVAL  09/05/2018   Procedure: STENT REMOVAL;  Surgeon: Milus Banister, MD;  [facility];  Service: Endoscopy;;   STONE EXTRACTION WITH BASKET  09/05/2018   Procedure: STONE EXTRACTION WITH BASKET;  Surgeon: Milus Banister, MD;  [facility];  Service: Endoscopy;;   THORACIC AORTIC ANEURYSM REPAIR  03/2018    TRANSURETHRAL RESECTION OF PROSTATE N/A 03/10/2019   Procedure: TRANSURETHRAL RESECTION OF THE PROSTATE (TURP);  Surgeon: Lucas Mallow, MD;  Location: [facility];  Service: Urology;  Laterality: N/A;     A IV Location/Drains/Wounds Patient Lines/Drains/Airways Status     Active Line/Drains/Airways     Name Placement date Placement time Site Days   Peripheral IV 06/21/22 20 G Left Antecubital 06/21/22  1500  Antecubital  1   Biliary Tube Cook slip-coat 12 Fr. RUQ 10/08/18  0904  RUQ  1353   Urethral Catheter candace Non-latex;Straight-tip 22 Fr. 05/30/22  2314  Non-latex;Straight-tip  23            Intake/Output Last 24 hours No intake or output data in the 24 hours ending 06/22/22 1321  Labs/Imaging Results for orders placed or performed during the hospital encounter of 06/21/22 (from the past 48 hour(s))  Resp Panel by RT-PCR (Flu A&B, Covid) Anterior Nasal Swab     Status: None   Collection Time: 06/21/22 12:24 PM   Specimen: Anterior Nasal Swab  Result Value Ref Range   SARS Coronavirus 2 by RT PCR NEGATIVE NEGATIVE    Comment: (NOTE) SARS-CoV-2 target nucleic acids are NOT DETECTED.  The SARS-CoV-2 RNA is generally detectable in upper respiratory specimens during the  acute phase of infection. The lowest concentration of SARS-CoV-2 viral copies this assay can detect is 138 copies/mL. A negative result does not preclude SARS-Cov-2 infection and should not be used as the sole basis for treatment or other patient management decisions. A negative result may occur with  improper specimen collection/handling, submission of specimen other than nasopharyngeal swab, presence of viral mutation(s) within the areas targeted by this assay, and inadequate number of viral copies(<138 copies/mL). A negative result must be combined with clinical observations, patient history, and epidemiological information. The expected result is Negative.  Fact Sheet for Patients:   EntrepreneurPulse.com.au  Fact Sheet for Healthcare Providers:  IncredibleEmployment.be  This test is no t yet approved or cleared by the [place] FDA and  has been authorized for detection and/or diagnosis of SARS-CoV-2 by FDA under an Emergency Use Authorization (EUA). This EUA will remain  in effect (meaning this test can be used) for the duration of the COVID-19 declaration under Section 564(b)(1) of the Act, 21 U.S.C.section 360bbb-3(b)(1), unless the authorization is terminated  or revoked sooner.       Influenza A by PCR NEGATIVE NEGATIVE   Influenza B by PCR NEGATIVE NEGATIVE    Comment: (NOTE) The Xpert Xpress SARS-CoV-2/FLU/RSV plus assay is intended as an aid in the diagnosis of influenza from Nasopharyngeal swab specimens and should not be used as a sole basis for treatment. Nasal washings and aspirates are unacceptable for Xpert Xpress SARS-CoV-2/FLU/RSV testing.  Fact Sheet for Patients: EntrepreneurPulse.com.au  Fact Sheet for Healthcare Providers: IncredibleEmployment.be  This test is not yet approved or cleared by the [place] FDA and has been authorized for detection and/or diagnosis of SARS-CoV-2 by FDA under an Emergency Use Authorization (EUA). This EUA will remain in effect (meaning this test can be used) for the duration of the COVID-19 declaration under Section 564(b)(1) of the Act, 21 U.S.C. section 360bbb-3(b)(1), unless the authorization is terminated or revoked.  Performed at [facility], [place] [address], [place], [place] [address]   CBC with Differential     Status: Abnormal   Collection Time: 06/21/22  3:26 PM  Result Value Ref Range   WBC 10.1 4.0 - 10.5 K/uL   RBC 4.16 (L) 4.22 - 5.81 MIL/uL   Hemoglobin 12.8 (L) 13.0 - 17.0 g/dL   HCT 38.7 (L) 39.0 - 52.0 %   MCV 93.0 80.0 - 100.0 fL   MCH 30.8 26.0 - 34.0 pg   MCHC 33.1 30.0 - 36.0 g/dL   RDW  14.6 11.5 - 15.5 %   Platelets 231 150 - 400 K/uL   nRBC 0.0 0.0 - 0.2 %   Neutrophils Relative % 78 %   Neutro Abs 7.9 (H) 1.7 - 7.7 K/uL   Lymphocytes Relative 11 %   Lymphs Abs 1.1 0.7 - 4.0 K/uL   Monocytes Relative 7 %   Monocytes Absolute 0.7 0.1 - 1.0 K/uL   Eosinophils Relative 3 %   Eosinophils Absolute 0.3 0.0 - 0.5 K/uL   Basophils Relative 0 %   Basophils Absolute 0.0 0.0 - 0.1 K/uL   Immature Granulocytes 1 %   Abs Immature Granulocytes 0.05 0.00 - 0.07 K/uL    Comment: Performed at [facility] [address], [place], [place] [address]  Basic metabolic panel     Status: Abnormal   Collection Time: 06/21/22  3:26 PM  Result Value Ref Range   Sodium 136 135 - 145 mmol/L   Potassium 3.2 (L)  3.5 - 5.1 mmol/L   Chloride 100 98 - 111 mmol/L   CO2 27 22 - 32 mmol/L   Glucose, Bld 117 (H) 70 - 99 mg/dL    Comment: Glucose reference range applies only to samples taken after fasting for at least 8 hours.   BUN 21 8 - 23 mg/dL   Creatinine, Ser 1.98 (H) 0.61 - 1.24 mg/dL   Calcium 8.7 (L) 8.9 - 10.3 mg/dL   GFR, Estimated 32 (L) >60 mL/min    Comment: (NOTE) Calculated using the CKD-EPI Creatinine Equation (2021)    Anion gap 9 5 - 15    Comment: Performed at [facility] [address], [place], [place] [address]  Troponin I (High Sensitivity)     Status: None   Collection Time: 06/21/22  3:26 PM  Result Value Ref Range   Troponin I (High Sensitivity) 16 <18 ng/L    Comment: (NOTE) Elevated high sensitivity troponin I (hsTnI) values and significant  changes across serial measurements may suggest ACS but many other  chronic and acute conditions are known to elevate hsTnI results.  Refer to the "Links" section for chest pain algorithms and additional  guidance. Performed at [facility], [place] [address] [place], [place] [address]   Strep pneumoniae urinary antigen     Status: None   Collection Time: 06/21/22  7:26 PM  Result Value Ref Range    Strep Pneumo Urinary Antigen NEGATIVE NEGATIVE    Comment:        Infection due to S. pneumoniae cannot be absolutely ruled out since the antigen present may be below the detection limit of the test. Performed at [facility], [place] [address], [place],  [address]   Respiratory (~20 pathogens) panel by PCR     Status: Abnormal   Collection Time: 06/21/22  7:34 PM   Specimen: Nasopharyngeal Swab; Respiratory  Result Value Ref Range   Adenovirus NOT DETECTED NOT DETECTED   Coronavirus 229E NOT DETECTED NOT DETECTED    Comment: (NOTE) The Coronavirus on the Respiratory Panel, DOES NOT test for the novel  Coronavirus (2019 nCoV)    Coronavirus HKU1 NOT DETECTED NOT DETECTED   Coronavirus NL63 NOT DETECTED NOT DETECTED   Coronavirus OC43 NOT DETECTED NOT DETECTED   Metapneumovirus NOT DETECTED NOT DETECTED   Rhinovirus / Enterovirus DETECTED (A) NOT DETECTED   Influenza A NOT DETECTED NOT DETECTED   Influenza B NOT DETECTED NOT DETECTED   Parainfluenza Virus 1 NOT DETECTED NOT DETECTED   Parainfluenza Virus 2 NOT DETECTED NOT DETECTED   Parainfluenza Virus 3 NOT DETECTED NOT DETECTED   Parainfluenza Virus 4 NOT DETECTED NOT DETECTED   Respiratory Syncytial Virus NOT DETECTED NOT DETECTED   Bordetella pertussis NOT DETECTED NOT DETECTED   Bordetella Parapertussis NOT DETECTED NOT DETECTED   Chlamydophila pneumoniae NOT DETECTED NOT DETECTED   Mycoplasma pneumoniae NOT DETECTED NOT DETECTED    Comment: Performed at [facility], [place] [address], [place], [place] [address]  Troponin I (High Sensitivity)     Status: None   Collection Time: 06/21/22  8:36 PM  Result Value Ref Range   Troponin I (High Sensitivity) 15 <18 ng/L    Comment: (NOTE) Elevated high sensitivity troponin I (hsTnI) values and significant  changes across serial measurements may suggest ACS but many other  chronic and acute conditions are known to elevate hsTnI results.  Refer to the "Links"  section for chest pain algorithms and  additional  guidance. Performed at [facility], [place] [address], [place], [place] [address]   Magnesium     Status: None   Collection Time: 06/21/22  8:36 PM  Result Value Ref Range   Magnesium 2.2 1.7 - 2.4 mg/dL    Comment: Performed at [facility] [address] [place], [place] [address]  Hepatic function panel     Status: Abnormal   Collection Time: 06/21/22  8:36 PM  Result Value Ref Range   Total Protein 7.0 6.5 - 8.1 g/dL   Albumin 2.9 (L) 3.5 - 5.0 g/dL   AST 21 15 - 41 U/L   ALT 16 0 - 44 U/L   Alkaline Phosphatase 72 38 - 126 U/L   Total Bilirubin 0.8 0.3 - 1.2 mg/dL   Bilirubin, Direct 0.2 0.0 - 0.2 mg/dL   Indirect Bilirubin 0.6 0.3 - 0.9 mg/dL    Comment: Performed at [facility] [address] [place],  [address]  Procalcitonin - Baseline     Status: None   Collection Time: 06/21/22  8:36 PM  Result Value Ref Range   Procalcitonin 0.15 ng/mL    Comment:        Interpretation: PCT (Procalcitonin) <= 0.5 ng/mL: Systemic infection (sepsis) is not likely. Local bacterial infection is possible. (NOTE)       Sepsis PCT Algorithm           Lower Respiratory Tract                                      Infection PCT Algorithm    ----------------------------     ----------------------------         PCT < 0.25 ng/mL                PCT < 0.10 ng/mL          Strongly encourage             Strongly discourage   discontinuation of antibiotics    initiation of antibiotics    ----------------------------     -----------------------------       PCT 0.25 - 0.50 ng/mL            PCT 0.10 - 0.25 ng/mL               OR       >80% decrease in PCT            Discourage initiation of                                            antibiotics      Encourage discontinuation           of antibiotics    ----------------------------     -----------------------------         PCT >= 0.50 ng/mL              PCT 0.26 - 0.50  ng/mL               AND        <80% decrease in PCT             Encourage initiation of  antibiotics       Encourage continuation           of antibiotics    ----------------------------     -----------------------------        PCT >= 0.50 ng/mL                  PCT > 0.50 ng/mL               AND         increase in PCT                  Strongly encourage                                      initiation of antibiotics    Strongly encourage escalation           of antibiotics                                     -----------------------------                                           PCT <= 0.25 ng/mL                                                 OR                                        > 80% decrease in PCT                                      Discontinue / Do not initiate                                             antibiotics  Performed at [facility], [address], [place], [place] [address]   CBC     Status: Abnormal   Collection Time: 06/22/22  4:30 AM  Result Value Ref Range   WBC 6.3 4.0 - 10.5 K/uL   RBC 3.74 (L) 4.22 - 5.81 MIL/uL   Hemoglobin 11.7 (L) 13.0 - 17.0 g/dL   HCT 34.1 (L) 39.0 - 52.0 %   MCV 91.2 80.0 - 100.0 fL   MCH 31.3 26.0 - 34.0 pg   MCHC 34.3 30.0 - 36.0 g/dL   RDW 14.3 11.5 - 15.5 %   Platelets 219 150 - 400 K/uL   nRBC 0.0 0.0 - 0.2 %    Comment: Performed at [facility], [place] [address] [place], [place] [address]  Brain natriuretic peptide     Status: Abnormal   Collection Time: 06/22/22  4:30 AM  Result Value Ref Range   B Natriuretic Peptide 199.6 (H) 0.0 - 100.0 pg/mL  Comment: Performed at [facility], [place] [address] [place], [place] [address]   DG Chest 2 View  Result Date: 06/22/2022 CLINICAL DATA:  Shortness of breath EXAM: CHEST - 2 VIEW COMPARISON:  Yesterday FINDINGS: Lateral view is moderately motion degraded, essentially nondiagnostic. Pacer with leads at  right atrium and right ventricle. No lead discontinuity. Midline trachea. Mild cardiomegaly. No pleural effusion or pneumothorax. Peripheral predominant interstitial thickening is similar to yesterday but increased from 10/17/2021. No well-defined lobar consolidation. IMPRESSION: No significant change since 1 day prior. Peripheral predominant interstitial thickening likely represents mild interstitial edema. Atypical infection could look similar. Electronically Signed   By: Abigail Miyamoto M.D.   On: 06/22/2022 08:19   DG Chest Portable 1 View  Result Date: 06/21/2022 CLINICAL DATA:  Shortness of breath EXAM: PORTABLE CHEST 1 VIEW COMPARISON:  Previous studies including the examination of 10/17/2021 FINDINGS: Transverse diameter of heart is increased. There is significant interval increase in interstitial markings scattered throughout both lungs. Left lateral CP angle is indistinct. There is no pneumothorax. Pacemaker battery is seen in the left infraclavicular region. IMPRESSION: There is significant interval increase in interstitial markings in both lungs, more so in the right upper lung field. Findings suggest multifocal interstitial pneumonia or interstitial edema. Part of this finding suggests underlying scarring. Electronically Signed   By: Elmer Picker M.D.   On: 06/21/2022 14:55    Pending Labs Unresulted Labs (From admission, onward)     Start     Ordered   06/22/22 9924  Basic metabolic panel  Once,   R        06/22/22 0515   06/21/22 1926  Expectorated Sputum Assessment w Gram Stain, Rflx to Resp Cult  (COPD / Pneumonia / Cellulitis / Lower Extremity Wound)  Once,   R        06/21/22 1926   06/21/22 1926  Legionella Pneumophila Serogp 1 Ur Ag  (COPD / Pneumonia / Cellulitis / Lower Extremity Wound)  Once,   R        06/21/22 1926            Vitals/Pain Today's Vitals   06/22/22 1005 06/22/22 1030 06/22/22 1142 06/22/22 1144  BP: (!) 143/69 (!) 106/58 (!) 165/70   Pulse: 62  63 63   Resp: '20 15 20   '$ Temp:   97.7 F (36.5 C)   TempSrc:   Oral   SpO2: 92% 91% 93% 94%  PainSc:        Isolation Precautions Droplet precaution  Medications Medications  aspirin EC tablet 81 mg (81 mg Oral Given 06/22/22 1002)  amiodarone (PACERONE) tablet 200 mg (200 mg Oral Given 06/22/22 1001)  diltiazem (CARDIZEM CD) 24 hr capsule 240 mg (240 mg Oral Given 06/22/22 1001)  rosuvastatin (CRESTOR) tablet 10 mg (10 mg Oral Given 06/22/22 1003)  apixaban (ELIQUIS) tablet 2.5 mg (2.5 mg Oral Given 06/22/22 1001)  sodium chloride flush (NS) 0.9 % injection 3 mL (3 mLs Intravenous Given 06/22/22 1004)  sodium chloride flush (NS) 0.9 % injection 3 mL (has no administration in time range)  0.9 %  sodium chloride infusion (has no administration in time range)  acetaminophen (TYLENOL) tablet 650 mg (has no administration in time range)    Or  acetaminophen (TYLENOL) suppository 650 mg (has no administration in time range)  guaiFENesin (MUCINEX) 12 hr tablet 600 mg (600 mg Oral Given 06/22/22 1002)  levalbuterol (XOPENEX) nebulizer solution 0.63 mg (0.63 mg Nebulization Given 06/22/22  0422)  metoprolol tartrate (LOPRESSOR) injection 5 mg (has no administration in time range)  azithromycin (ZITHROMAX) 500 mg in sodium chloride 0.9 % 250 mL IVPB (has no administration in time range)  cefTRIAXone (ROCEPHIN) 1 g in sodium chloride 0.9 % 100 mL IVPB (1 g Intravenous New Bag/Given 06/22/22 1213)  ipratropium-albuterol (DUONEB) 0.5-2.5 (3) MG/3ML nebulizer solution 3 mL (3 mLs Nebulization Given 06/21/22 1549)  methylPREDNISolone sodium succinate (SOLU-MEDROL) 125 mg/2 mL injection 125 mg (125 mg Intravenous Given 06/21/22 1548)  azithromycin (ZITHROMAX) 500 mg in sodium chloride 0.9 % 250 mL IVPB (0 mg Intravenous Stopped 06/21/22 1648)  potassium chloride SA (KLOR-CON M) CR tablet 40 mEq (40 mEq Oral Given 06/21/22 2050)    Mobility walks Low fall risk   Focused Assessments Pulmonary  Assessment Handoff:  Lung sounds:   O2 Device: High Flow Nasal Cannula O2 Flow Rate (L/min): 5 L/min    R Recommendations: See Admitting Provider Note  Report given to:   Additional Notes:

## 2022-06-23 DIAGNOSIS — Z978 Presence of other specified devices: Secondary | ICD-10-CM | POA: Diagnosis not present

## 2022-06-23 DIAGNOSIS — J9601 Acute respiratory failure with hypoxia: Secondary | ICD-10-CM | POA: Diagnosis not present

## 2022-06-23 DIAGNOSIS — N1832 Chronic kidney disease, stage 3b: Secondary | ICD-10-CM | POA: Diagnosis not present

## 2022-06-23 DIAGNOSIS — I10 Essential (primary) hypertension: Secondary | ICD-10-CM | POA: Diagnosis not present

## 2022-06-23 LAB — CBC WITH DIFFERENTIAL/PLATELET
Abs Immature Granulocytes: 0.05 10*3/uL (ref 0.00–0.07)
Basophils Absolute: 0 10*3/uL (ref 0.0–0.1)
Basophils Relative: 0 %
Eosinophils Absolute: 0 10*3/uL (ref 0.0–0.5)
Eosinophils Relative: 0 %
HCT: 34.6 % — ABNORMAL LOW (ref 39.0–52.0)
Hemoglobin: 11.5 g/dL — ABNORMAL LOW (ref 13.0–17.0)
Immature Granulocytes: 0 %
Lymphocytes Relative: 9 %
Lymphs Abs: 1.2 10*3/uL (ref 0.7–4.0)
MCH: 30.9 pg (ref 26.0–34.0)
MCHC: 33.2 g/dL (ref 30.0–36.0)
MCV: 93 fL (ref 80.0–100.0)
Monocytes Absolute: 0.6 10*3/uL (ref 0.1–1.0)
Monocytes Relative: 5 %
Neutro Abs: 11.7 10*3/uL — ABNORMAL HIGH (ref 1.7–7.7)
Neutrophils Relative %: 86 %
Platelets: 265 10*3/uL (ref 150–400)
RBC: 3.72 MIL/uL — ABNORMAL LOW (ref 4.22–5.81)
RDW: 14.6 % (ref 11.5–15.5)
WBC: 13.6 10*3/uL — ABNORMAL HIGH (ref 4.0–10.5)
nRBC: 0 % (ref 0.0–0.2)

## 2022-06-23 LAB — MAGNESIUM: Magnesium: 2.4 mg/dL (ref 1.7–2.4)

## 2022-06-23 LAB — BASIC METABOLIC PANEL
Anion gap: 7 (ref 5–15)
BUN: 27 mg/dL — ABNORMAL HIGH (ref 8–23)
CO2: 25 mmol/L (ref 22–32)
Calcium: 8.5 mg/dL — ABNORMAL LOW (ref 8.9–10.3)
Chloride: 108 mmol/L (ref 98–111)
Creatinine, Ser: 1.58 mg/dL — ABNORMAL HIGH (ref 0.61–1.24)
GFR, Estimated: 42 mL/min — ABNORMAL LOW (ref >60)
Glucose, Bld: 124 mg/dL — ABNORMAL HIGH (ref 70–99)
Potassium: 3.9 mmol/L (ref 3.5–5.1)
Sodium: 140 mmol/L (ref 135–145)

## 2022-06-23 LAB — LEGIONELLA PNEUMOPHILA SEROGP 1 UR AG: L. pneumophila Serogp 1 Ur Ag: NEGATIVE

## 2022-06-23 MED ORDER — FUROSEMIDE 40 MG PO TABS
40.0000 mg | ORAL_TABLET | Freq: Two times a day (BID) | ORAL | Status: DC
  Administered 2022-06-23 – 2022-06-26 (×7): 40 mg via ORAL
  Filled 2022-06-23 (×7): qty 1

## 2022-06-23 MED ORDER — BENZONATATE 100 MG PO CAPS
100.0000 mg | ORAL_CAPSULE | Freq: Three times a day (TID) | ORAL | Status: DC | PRN
  Administered 2022-06-23 – 2022-06-28 (×4): 100 mg via ORAL
  Filled 2022-06-23 (×4): qty 1

## 2022-06-23 MED ORDER — DEXTROMETHORPHAN POLISTIREX ER 30 MG/5ML PO SUER
30.0000 mg | Freq: Two times a day (BID) | ORAL | Status: DC | PRN
  Administered 2022-06-24 – 2022-06-25 (×2): 30 mg via ORAL
  Filled 2022-06-23 (×4): qty 5

## 2022-06-23 MED ORDER — MENTHOL 3 MG MT LOZG
1.0000 | LOZENGE | OROMUCOSAL | Status: DC | PRN
  Administered 2022-06-23 – 2022-07-03 (×11): 3 mg via ORAL
  Filled 2022-06-23 (×3): qty 9

## 2022-06-23 NOTE — Progress Notes (Signed)
Progress Note  Patient: Ernest Parker IOX:[id number] DOB: 1933/11/10  DOA: 06/21/2022  DOS: 06/23/2022    Brief hospital course: Ernest Parker is an 88 y.o. male with a history of AFib, stage III CKD, HTN, HLD, SSS s/p PPM, prostate CA s/p XRT, T2DM, chronic indwelling foley, remote tobacco use, and GERD who presented to the ED with increasing shortness of breath in the setting of rhinorrhea, congestion and cough associated with diffuse weakness. He was found to be hypoxemic with multifocal pneumonia detailed on CXR. Subsequent virus panel positive for rhinovirus.   Assessment and Plan: Acute hypoxic respiratory failure due to rhinovirus multifocal pneumonia:  - Continue supplemental oxygen, wean as tolerated. Work on mobilizing as much as possible, remains newly hypoxemic with ambulation today. Incentive spirometry, d/w RN.  - RVP added on +rhinovirus, neg covid, flu. PCT 0.15, WBC normal. Will treat supportively and while needing HFNC O2 continue abx with the possibility, albeit low, of superimposed bacterial PNA.  - Sputum Cx - Given high dose steroid initially. He does have remote smoking hx (20 pack years) but no wheezing or Dx COPD. We will give prn bronchodilators and restart steroids if any wheezing develops. Add antitussives. No wheezing on exam still. - Restart home lasix as below.  - Based on exam, age, suspect he'll continue with oxygen requirement for several days. If clinically improving with continued stability, may be candidate for home O2.  Chronic HFpEF: LVEF 60-65%, G1DD, mod LAE - Held lasix for AKI, Cr improved, elevated LVEDP, will restart home lasix '40mg'$  po BID. - Follow up aortic dilatation by echo.   Hypokalemia: Resolved with supplementation.  - Monitor.   Leukocytosis: Developed after steroids.  AKI on stage IIIb CKD: Based on available values. - Holding lasix today as he appears euvolemic. If respiratory difficulties worsen, low threshold to empirically trial IV  lasix.   PAF, sinus node dysfunction s/p PPM: In paced/NSR. - Continue amiodarone, cardizem and eliquis.  - Telemetry personally reviewed, stable, no cardiac symptoms, will DC tele to facilitate mobility.   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 He is s/p left SFA stent placement 05/17/21 by Dr. Trula Slade - Continue ASA and crestor   NIDT2DM: HbA1c 5.7%.  - SSI   HLD:  - Continue statin  Hx prostate CA s/p XRT, chronic indwelling foley catheter - Foley catheter care   Obesity: Estimated body mass index is 31.75 kg/m as calculated from the following:   Height as of this encounter: '5\' 9"'$  (1.753 m).   Weight as of 05/30/22: 97.5 kg.  Subjective: Walked to bathroom with RN, denies significant shortness of breath but did desaturate as low as 78% per RN. Still with persistent dry cough, no wheezing. No leg swelling or chest pain reported.  Objective: Vitals:   06/22/22 1930 06/22/22 2338 06/23/22 0313 06/23/22 1000  BP: (!) 164/71 (!) 152/71 (!) 145/67 (!) 150/67  Pulse: 60 60 60 60  Resp: '18 18 16   '$ Temp: 97.9 F (36.6 C) 98 F (36.7 C) 98.1 F (36.7 C) 97.6 F (36.4 C)  TempSrc: Oral Oral Oral Oral  SpO2: 92% 97% 92% 91%  Height:       Gen: 88 y.o. male in no distress Pulm: Nonlabored breathing 5L O2 by [place], crackles nondependently/diffusely without wheezes. CV:  Regular with occasional premature beat, stable tele, no murmur, rub, or gallop. No JVD, no dependent edema. GI: Abdomen soft, non-tender, non-distended, with normoactive bowel sounds.  Ext: Warm, no deformities Skin: No rashes, lesions or ulcers on visualized skin. Neuro: Alert and oriented. No focal neurological deficits. Psych: Judgement and insight appear fair. Mood euthymic & affect congruent. Behavior is appropriate.     Data Personally reviewed: CBC: Recent Labs  Lab 06/21/22 1526 06/22/22 0430 06/23/22 0500  WBC 10.1 6.3 13.6*  NEUTROABS 7.9*  --  11.7*  HGB 12.8* 11.7* 11.5*  HCT 38.7* 34.1* 34.6*  MCV 93.0 91.2 93.0  PLT 231 219 884   Basic Metabolic Panel: Recent Labs  Lab 06/21/22 1526 06/21/22 2036 06/22/22 1606 06/23/22 0500  NA 136  --  138 140  K 3.2*  --  4.2 3.9  CL 100  --  103 108  CO2 27  --  26 25  GLUCOSE 117*  --  105* 124*  BUN 21  --  28* 27*  CREATININE 1.98*  --  1.82* 1.58*  CALCIUM 8.7*  --  9.1 8.5*  MG  --  2.2  --  2.4   GFR: CrCl cannot be calculated (Unknown ideal weight.). Liver Function Tests: Recent Labs  Lab 06/21/22 2036  AST 21  ALT 16  ALKPHOS 72  BILITOT 0.8  PROT 7.0  ALBUMIN 2.9*   No results for input(s): "LIPASE", "AMYLASE" in the last 168 hours. No results for input(s): "AMMONIA" in the last 168 hours. Coagulation Profile: No results for input(s): "INR", "PROTIME" in the last 168 hours. Cardiac Enzymes: No results for input(s): "CKTOTAL", "CKMB", "CKMBINDEX", "TROPONINI" in the last 168 hours. BNP (last 3 results) No results for input(s): "PROBNP" in the last 8760 hours. HbA1C: No results for input(s): "HGBA1C" in the last 72 hours. CBG: No results for input(s): "GLUCAP" in the last 168 hours. Lipid Profile: No results for input(s): "CHOL", "HDL", "LDLCALC", "TRIG", "CHOLHDL", "LDLDIRECT" in the last 72 hours. Thyroid Function Tests: No results for input(s): "TSH", "T4TOTAL", "FREET4", "T3FREE", "THYROIDAB" in the last 72 hours. Anemia Panel: No results for input(s): "VITAMINB12", "FOLATE", "FERRITIN", "TIBC", "IRON", "RETICCTPCT" in the last 72 hours. Urine analysis:    Component Value Date/Time   COLORURINE YELLOW 05/18/2021 1114   APPEARANCEUR TURBID (A) 05/18/2021 1114   LABSPEC 1.010 05/18/2021 1114   PHURINE 8.5 (H) 05/18/2021 1114   GLUCOSEU NEGATIVE 05/18/2021 1114   HGBUR LARGE (A) 05/18/2021 1114    BILIRUBINUR NEGATIVE 05/18/2021 1114   KETONESUR NEGATIVE 05/18/2021 1114   PROTEINUR 100 (A) 05/18/2021 1114   NITRITE NEGATIVE 05/18/2021 1114   LEUKOCYTESUR MODERATE (A) 05/18/2021 1114   Recent Results (from the past 240 hour(s))  Resp Panel by RT-PCR (Flu A&B, Covid) Anterior Nasal Swab     Status: None   Collection Time: 06/21/22 12:24 PM   Specimen: Anterior Nasal Swab  Result Value Ref Range Status   SARS Coronavirus 2 by RT PCR NEGATIVE NEGATIVE Final    Comment: (NOTE) SARS-CoV-2 target nucleic acids are NOT DETECTED.  The SARS-CoV-2 RNA is generally detectable in upper respiratory specimens during the acute phase of infection. The lowest concentration of SARS-CoV-2 viral copies this assay can detect is 138 copies/mL. A negative result does not preclude SARS-Cov-2 infection and should not be used as the sole basis for treatment or other patient management decisions. A negative result may occur with  improper specimen collection/handling, submission of specimen other than nasopharyngeal swab, presence of  viral mutation(s) within the areas targeted by this assay, and inadequate number of viral copies(<138 copies/mL). A negative result must be combined with clinical observations, patient history, and epidemiological information. The expected result is Negative.  Fact Sheet for Patients:  EntrepreneurPulse.com.au  Fact Sheet for Healthcare Providers:  IncredibleEmployment.be  This test is no t yet approved or cleared by the [place] FDA and  has been authorized for detection and/or diagnosis of SARS-CoV-2 by FDA under an Emergency Use Authorization (EUA). This EUA will remain  in effect (meaning this test can be used) for the duration of the COVID-19 declaration under Section 564(b)(1) of the Act, 21 U.S.C.section 360bbb-3(b)(1), unless the authorization is terminated  or revoked sooner.       Influenza A by PCR NEGATIVE  NEGATIVE Final   Influenza B by PCR NEGATIVE NEGATIVE Final    Comment: (NOTE) The Xpert Xpress SARS-CoV-2/FLU/RSV plus assay is intended as an aid in the diagnosis of influenza from Nasopharyngeal swab specimens and should not be used as a sole basis for treatment. Nasal washings and aspirates are unacceptable for Xpert Xpress SARS-CoV-2/FLU/RSV testing.  Fact Sheet for Patients: EntrepreneurPulse.com.au  Fact Sheet for Healthcare Providers: IncredibleEmployment.be  This test is not yet approved or cleared by the [place] FDA and has been authorized for detection and/or diagnosis of SARS-CoV-2 by FDA under an Emergency Use Authorization (EUA). This EUA will remain in effect (meaning this test can be used) for the duration of the COVID-19 declaration under Section 564(b)(1) of the Act, 21 U.S.C. section 360bbb-3(b)(1), unless the authorization is terminated or revoked.  Performed at [facility], [place] [address] [place], [place] [address]   Respiratory (~20 pathogens) panel by PCR     Status: Abnormal   Collection Time: 06/21/22  7:34 PM   Specimen: Nasopharyngeal Swab; Respiratory  Result Value Ref Range Status   Adenovirus NOT DETECTED NOT DETECTED Final   Coronavirus 229E NOT DETECTED NOT DETECTED Final    Comment: (NOTE) The Coronavirus on the Respiratory Panel, DOES NOT test for the novel  Coronavirus (2019 nCoV)    Coronavirus HKU1 NOT DETECTED NOT DETECTED Final   Coronavirus NL63 NOT DETECTED NOT DETECTED Final   Coronavirus OC43 NOT DETECTED NOT DETECTED Final   Metapneumovirus NOT DETECTED NOT DETECTED Final   Rhinovirus / Enterovirus DETECTED (A) NOT DETECTED Final   Influenza A NOT DETECTED NOT DETECTED Final   Influenza B NOT DETECTED NOT DETECTED Final   Parainfluenza Virus 1 NOT DETECTED NOT DETECTED Final   Parainfluenza Virus 2 NOT DETECTED NOT DETECTED Final   Parainfluenza Virus 3 NOT DETECTED NOT DETECTED  Final   Parainfluenza Virus 4 NOT DETECTED NOT DETECTED Final   Respiratory Syncytial Virus NOT DETECTED NOT DETECTED Final   Bordetella pertussis NOT DETECTED NOT DETECTED Final   Bordetella Parapertussis NOT DETECTED NOT DETECTED Final   Chlamydophila pneumoniae NOT DETECTED NOT DETECTED Final   Mycoplasma pneumoniae NOT DETECTED NOT DETECTED Final    Comment: Performed at [facility], [place]. [address], [place], [place] [address]     ECHOCARDIOGRAM COMPLETE  Result Date: 06/22/2022    ECHOCARDIOGRAM REPORT   Patient Name:   Ernest Parker Date of Exam: 06/22/2022 Medical Rec #:  [id number]     Height:       70.0 in Accession #:    [id number]    Weight:       215.0 lb Date of Birth:  02/12/1934  BSA:          2.152 m Patient Age:    86 years      BP:           128/65 mmHg Patient Gender: M             HR:           77 bpm. Exam Location:  Inpatient Procedure: 2D Echo, Cardiac Doppler and Color Doppler Indications:    [place] respiratory distress R06.03                 Cardiomyopathy-Unspecified I42.9                 Heart block, RBBB I45.10  History:        Patient has prior history of Echocardiogram examinations, most                 recent 07/12/2020. Pacemaker, TIA and PAD, Arrythmias:Atrial                 Fibrillation, Signs/Symptoms:Syncope, Dyspnea and Shortness of                 Breath; Risk Factors:Hypertension and Diabetes.  Sonographer:    Ronny Flurry Referring Phys: [place]  1. Left ventricular ejection fraction, by estimation, is 60 to 65%. The left ventricle has normal function. The left ventricle has no regional wall motion abnormalities. There is mild concentric left ventricular hypertrophy. Left ventricular diastolic parameters are consistent with Grade I diastolic dysfunction (impaired relaxation). Elevated left ventricular end-diastolic pressure.  2. Right ventricular systolic function is normal. The right ventricular size is normal.  3. Left atrial  size was moderately dilated.  4. The mitral valve is normal in structure. Mild mitral valve regurgitation. No evidence of mitral stenosis.  5. The aortic valve is tricuspid. Aortic valve regurgitation is not visualized. No aortic stenosis is present.  6. Aortic dilatation noted. There is mild dilatation of the aortic root, measuring 38 mm. There is moderate dilatation of the ascending aorta, measuring 43 mm.  7. The inferior vena cava is normal in size with greater than 50% respiratory variability, suggesting right atrial pressure of 3 mmHg. FINDINGS  Left Ventricle: Left ventricular ejection fraction, by estimation, is 60 to 65%. The left ventricle has normal function. The left ventricle has no regional wall motion abnormalities. The left ventricular internal cavity size was normal in size. There is  mild concentric left ventricular hypertrophy. Left ventricular diastolic parameters are consistent with Grade I diastolic dysfunction (impaired relaxation). Elevated left ventricular end-diastolic pressure. Right Ventricle: The right ventricular size is normal. No increase in right ventricular wall thickness. Right ventricular systolic function is normal. Left Atrium: Left atrial size was moderately dilated. Right Atrium: Right atrial size was normal in size. Pericardium: There is no evidence of pericardial effusion. Mitral Valve: The mitral valve is normal in structure. Mild mitral valve regurgitation. No evidence of mitral valve stenosis. Tricuspid Valve: The tricuspid valve is normal in structure. Tricuspid valve regurgitation is trivial. No evidence of tricuspid stenosis. Aortic Valve: The aortic valve is tricuspid. Aortic valve regurgitation is not visualized. No aortic stenosis is present. Aortic valve mean gradient measures 4.0 mmHg. Aortic valve peak gradient measures 7.2 mmHg. Aortic valve area, by VTI measures 2.75 cm. Pulmonic Valve: The pulmonic valve was normal in structure. Pulmonic valve regurgitation  is not visualized. No evidence of pulmonic stenosis. Aorta: Aortic dilatation noted. There is mild dilatation of  the aortic root, measuring 38 mm. There is moderate dilatation of the ascending aorta, measuring 43 mm. Venous: The inferior vena cava is normal in size with greater than 50% respiratory variability, suggesting right atrial pressure of 3 mmHg. IAS/Shunts: No atrial level shunt detected by color flow Doppler.  LEFT VENTRICLE PLAX 2D LVIDd:         5.20 cm   Diastology LVIDs:         3.80 cm   LV e' medial:    4.87 cm/s LV PW:         1.30 cm   LV E/e' medial:  15.5 LV IVS:        1.20 cm   LV e' lateral:   8.83 cm/s LVOT diam:     2.10 cm   LV E/e' lateral: 8.6 LV SV:         87 LV SV Index:   40 LVOT Area:     3.46 cm  RIGHT VENTRICLE RV S prime:     9.32 cm/s TAPSE (M-mode): 1.6 cm LEFT ATRIUM             Index        RIGHT ATRIUM           Index LA diam:        5.10 cm 2.37 cm/m   RA Area:     17.00 cm LA Vol (A2C):   73.6 ml 34.19 ml/m  RA Volume:   38.30 ml  17.79 ml/m LA Vol (A4C):   87.8 ml 40.79 ml/m LA Biplane Vol: 81.4 ml 37.82 ml/m  AORTIC VALVE AV Area (Vmax):    2.69 cm AV Area (Vmean):   2.72 cm AV Area (VTI):     2.75 cm AV Vmax:           134.00 cm/s AV Vmean:          92.100 cm/s AV VTI:            0.316 m AV Peak Grad:      7.2 mmHg AV Mean Grad:      4.0 mmHg LVOT Vmax:         104.00 cm/s LVOT Vmean:        72.300 cm/s LVOT VTI:          0.251 m LVOT/AV VTI ratio: 0.79  AORTA Ao Root diam: 3.80 cm Ao Asc diam:  4.30 cm MITRAL VALVE                TRICUSPID VALVE MV Area (PHT): 2.48 cm     TR Peak grad:   34.8 mmHg MV Decel Time: 306 msec     TR Vmax:        295.00 cm/s MV E velocity: 75.70 cm/s MV A velocity: 101.00 cm/s  SHUNTS MV E/A ratio:  0.75         Systemic VTI:  0.25 m                             Systemic Diam: 2.10 cm Skeet Latch MD Electronically signed by Skeet Latch MD Signature Date/Time: 06/22/2022/7:40:53 PM    Final    DG Chest 2 View  Result  Date: 06/22/2022 CLINICAL DATA:  Shortness of breath EXAM: CHEST - 2 VIEW COMPARISON:  Yesterday FINDINGS: Lateral view is moderately motion degraded, essentially nondiagnostic. Pacer with leads at right atrium and right ventricle. No lead discontinuity. Midline  trachea. Mild cardiomegaly. No pleural effusion or pneumothorax. Peripheral predominant interstitial thickening is similar to yesterday but increased from 10/17/2021. No well-defined lobar consolidation. IMPRESSION: No significant change since 1 day prior. Peripheral predominant interstitial thickening likely represents mild interstitial edema. Atypical infection could look similar. Electronically Signed   By: Abigail Miyamoto M.D.   On: 06/22/2022 08:19   DG Chest Portable 1 View  Result Date: 06/21/2022 CLINICAL DATA:  Shortness of breath EXAM: PORTABLE CHEST 1 VIEW COMPARISON:  Previous studies including the examination of 10/17/2021 FINDINGS: Transverse diameter of heart is increased. There is significant interval increase in interstitial markings scattered throughout both lungs. Left lateral CP angle is indistinct. There is no pneumothorax. Pacemaker battery is seen in the left infraclavicular region. IMPRESSION: There is significant interval increase in interstitial markings in both lungs, more so in the right upper lung field. Findings suggest multifocal interstitial pneumonia or interstitial edema. Part of this finding suggests underlying scarring. Electronically Signed   By: Elmer Picker M.D.   On: 06/21/2022 14:55    Family Communication: Daughter at bedside  Disposition: Status is: Inpatient Remains inpatient appropriate because: Persistent hypoxia Planned Discharge Destination: Home  Patrecia Pour, MD 06/23/2022 11:12 AM Page by [email or web address]

## 2022-06-23 NOTE — Plan of Care (Signed)

## 2022-06-23 NOTE — Progress Notes (Signed)
  Transition of Care [facility]) Screening Note   Patient Details  Name: RUSLAN MCCABE Date of Birth: [birth year]   Transition of Care [facility]) CM/SW Contact:    Geralynn Ochs, LCSW Phone Number: 06/23/2022, 1:11 PM    Transition of Care Department [facility]) has reviewed patient, medical workup ongoing. PT eval pending, noting patient may need home oxygen pending improvement. We will continue to monitor patient advancement through interdisciplinary progression rounds. If new patient transition needs arise, please place a TOC consult.

## 2022-06-24 DIAGNOSIS — N1832 Chronic kidney disease, stage 3b: Secondary | ICD-10-CM | POA: Diagnosis not present

## 2022-06-24 DIAGNOSIS — Z978 Presence of other specified devices: Secondary | ICD-10-CM | POA: Diagnosis not present

## 2022-06-24 DIAGNOSIS — J9601 Acute respiratory failure with hypoxia: Secondary | ICD-10-CM | POA: Diagnosis not present

## 2022-06-24 DIAGNOSIS — I10 Essential (primary) hypertension: Secondary | ICD-10-CM | POA: Diagnosis not present

## 2022-06-24 LAB — D-DIMER, QUANTITATIVE: D-Dimer, Quant: 5.07 ug/mL-FEU — ABNORMAL HIGH (ref 0.00–0.50)

## 2022-06-24 NOTE — Progress Notes (Signed)
SATURATION QUALIFICATIONS: (This note is used to comply with regulatory documentation for home oxygen)  Patient Saturations on Room Air at Rest = 80%  Patient Saturations on Room Air while Ambulating = NA, dropped at rest  Patient Saturations on 6 Liters of oxygen while Ambulating = 81%  Patient Saturations on 10 Liters of oxygen while Ambulating = 92%  Please briefly explain why patient needs home oxygen:  To maintain oxygen saturation >87% during functional mobility.    Arby Barrette, [place]  Office [phone]

## 2022-06-24 NOTE — Evaluation (Signed)
Physical Therapy Evaluation Patient Details Name: Ernest Parker MRN: [id number] DOB: 1934-07-03 Today's Date: 06/24/2022  History of Present Illness  88 y.o. male  who presented to ED 06/21/22 with complaints of shortness of breath. +pna;  PMH significant of atrial fibrillation, CKD stage 3, GERD, HLD, HTN, sinus node dysfunction s/p PPM, prostate cancer s/p radiation, T2DM, chronic indwelling foley  Clinical Impression   Pt admitted secondary to problem above with deficits below. PTA patient was independent living with his daughter and playing golf several times per week.  Pt currently requires minguard assist for ambulation with one stagger step to his left. Required up to 10 L of oxygen to maintain sats >88% when walking in hallway with mask (droplet precautions). In room without mask on 6L decr to 81%. (See separate note).  Anticipate patient will benefit from PT to address problems listed below.Will continue to follow acutely to maximize functional mobility independence and safety.          Recommendations for follow up therapy are one component of a multi-disciplinary discharge planning process, led by the attending physician.  Recommendations may be updated based on patient status, additional functional criteria and insurance authorization.  Follow Up Recommendations No PT follow up      Assistance Recommended at Discharge Intermittent Supervision/Assistance  Patient can return home with the following  Assistance with cooking/housework;Help with stairs or ramp for entrance    Equipment Recommendations None recommended by PT  Recommendations for Other Services       Functional Status Assessment Patient has had a recent decline in their functional status and demonstrates the ability to make significant improvements in function in a reasonable and predictable amount of time.     Precautions / Restrictions Precautions Precautions: Other (comment) Precaution Comments: desaturates  quickly      Mobility  Bed Mobility Overal bed mobility: Independent                  Transfers Overall transfer level: Modified independent Equipment used: None                    Ambulation/Gait Ambulation/Gait assistance: Min guard Gait Distance (Feet): 40 Feet (70, 70, 60) Assistive device: None Gait Pattern/deviations: Step-through pattern, Staggering left       General Gait Details: walked 40 ft in room on 6L with sats 92%; walked 70 ft (in hall with mask)on 6L with sats 80%; incr to 8L, then 10 L for sats 90% prior to return walk x 70 ft to room sats 92% on arrival; seated rest, walked 60 ft with no mask with sats decr 81% on 6L; one staggering step to his left with independent recovery  Stairs            Wheelchair Mobility    Modified Rankin (Stroke Patients Only)       Balance Overall balance assessment: Mild deficits observed, not formally tested                                           Pertinent Vitals/Pain Pain Assessment Pain Assessment: No/denies pain    Home Living Family/patient expects to be discharged to:: Private residence Living Arrangements: Children Available Help at Discharge: Family;Available 24 hours/day Type of Home: House           Home Equipment: None      Prior Function Prior  Level of Function : Independent/Modified Independent             Mobility Comments: plays golf (18 holes) several times per week       Hand Dominance        Extremity/Trunk Assessment   Upper Extremity Assessment Upper Extremity Assessment: Overall WFL for tasks assessed    Lower Extremity Assessment Lower Extremity Assessment: Overall WFL for tasks assessed    Cervical / Trunk Assessment Cervical / Trunk Assessment: Normal  Communication   Communication: No difficulties  Cognition Arousal/Alertness: Awake/alert Behavior During Therapy: WFL for tasks assessed/performed Overall Cognitive  Status: Within Functional Limits for tasks assessed                                          General Comments      Exercises     Assessment/Plan    PT Assessment Patient needs continued PT services  PT Problem List Decreased activity tolerance;Decreased balance;Decreased mobility;Decreased knowledge of use of DME;Cardiopulmonary status limiting activity       PT Treatment Interventions DME instruction;Gait training;Stair training;Functional mobility training;Therapeutic activities;Therapeutic exercise;Balance training;Patient/family education    PT Goals (Current goals can be found in the Care Plan section)  Acute Rehab PT Goals Patient Stated Goal: go home today PT Goal Formulation: With patient Time For Goal Achievement: 2022-08-06 Potential to Achieve Goals: Good    Frequency Min 3X/week     Co-evaluation               AM-PAC PT "6 Clicks" Mobility  Outcome Measure Help needed turning from your back to your side while in a flat bed without using bedrails?: None Help needed moving from lying on your back to sitting on the side of a flat bed without using bedrails?: None Help needed moving to and from a bed to a chair (including a wheelchair)?: None Help needed standing up from a chair using your arms (e.g., wheelchair or bedside chair)?: None Help needed to walk in hospital room?: A Little Help needed climbing 3-5 steps with a railing? : A Little 6 Click Score: 22    End of Session Equipment Utilized During Treatment: Gait belt;Oxygen Activity Tolerance: Treatment limited secondary to medical complications (Comment) (desaturates on 6L with walking) Patient left: in bed;with call bell/phone within reach;with bed alarm set;with family/visitor present Nurse Communication: Mobility status;Other (comment) (amb sats results) PT Visit Diagnosis: Other abnormalities of gait and mobility (R26.89)    Time: 9038-3338 PT Time Calculation (min) (ACUTE  ONLY): 30 min   Charges:   PT Evaluation $PT Eval Low Complexity: 1 Low PT Treatments $Gait Training: 8-22 mins         Arby Barrette, PT Acute Rehabilitation Services  Office [phone]   Rexanne Mano 06/24/2022, 12:22 PM

## 2022-06-24 NOTE — Plan of Care (Signed)
  Problem: Activity: Goal: Ability to tolerate increased activity will improve Outcome: Progressing   Problem: Clinical Measurements: Goal: Ability to maintain a body temperature in the normal range will improve Outcome: Progressing   Problem: Respiratory: Goal: Ability to maintain a clear airway will improve Outcome: Progressing   Problem: Education: Goal: Knowledge of General Education information will improve Description: Including pain rating scale, medication(s)/side effects and non-pharmacologic comfort measures Outcome: Progressing   Problem: Health Behavior/Discharge Planning: Goal: Ability to manage health-related needs will improve Outcome: Progressing   Problem: Clinical Measurements: Goal: Will remain free from infection Outcome: Progressing Goal: Diagnostic test results will improve Outcome: Progressing Goal: Respiratory complications will improve Outcome: Progressing Goal: Cardiovascular complication will be avoided Outcome: Progressing   Problem: Activity: Goal: Risk for activity intolerance will decrease Outcome: Progressing   Problem: Nutrition: Goal: Adequate nutrition will be maintained Outcome: Progressing   Problem: Coping: Goal: Level of anxiety will decrease Outcome: Progressing   Problem: Elimination: Goal: Will not experience complications related to bowel motility Outcome: Progressing Goal: Will not experience complications related to urinary retention Outcome: Progressing   Problem: Pain Managment: Goal: General experience of comfort will improve Outcome: Progressing   Problem: Safety: Goal: Ability to remain free from injury will improve Outcome: Progressing   Problem: Skin Integrity: Goal: Risk for impaired skin integrity will decrease Outcome: Progressing

## 2022-06-24 NOTE — Progress Notes (Signed)
Progress Note  Patient: Ernest Parker HLK:[id number] DOB: 02-01-1934  DOA: 06/21/2022  DOS: 06/24/2022    Brief hospital course: Ernest Parker is an 88 y.o. male with a history of AFib, stage III CKD, HTN, HLD, SSS s/p PPM, prostate CA s/p XRT, T2DM, chronic indwelling foley, remote tobacco use, and GERD who presented to the ED with increasing shortness of breath in the setting of rhinorrhea, congestion and cough associated with diffuse weakness. He was found to be hypoxemic with multifocal pneumonia detailed on CXR. Subsequent virus panel positive for rhinovirus.   Assessment and Plan: Acute hypoxic respiratory failure due to rhinovirus multifocal pneumonia:  - Continue supplemental oxygen, wean as tolerated. Work on mobilizing as much as possible, pt's gait is steady, no hx falls. Risk of deconditioning outweighs small risk of falls, so will encourage patient to get up ad lib though still preferably with someone there with him. - Incentive spirometry, d/w RN.  - RVP + rhinovirus, neg covid, flu. PCT 0.15, WBC normal. Will treat supportively and while needing HFNC O2 continue abx with the possibility, albeit low, of superimposed bacterial PNA.  - Sputum Cx if able to provide sample - Given high dose steroid initially. He does have remote smoking hx (20 pack years) but no wheezing or Dx COPD, doesn't feel bronchodilators help symptoms. No further steroids currently planned.  - Continue antitussives.   - Restarted home lasix as below.  - Based on exam, age, suspect he'll continue with oxygen requirement for several days, possible EDD 10/16. If clinically improving with continued stability, may be candidate for home O2.  - Check d-dimer as PE hasn't been ruled out. He has no chest pain and no tachycardia but with his cardiac history and medications this is not a reliable indicator.   Chronic HFpEF: LVEF 60-65%, G1DD, mod LAE - Held lasix for AKI, Cr improved, elevated LVEDP, so restarted home  lasix '40mg'$  po BID. - Follow up aortic dilatation by echo.   Hypokalemia: Resolved with supplementation.  - Monitor.   Leukocytosis: Developed after steroids.  AKI on stage IIIb CKD: Based on available values. - Monitor in AM. Would ideally avoid CTA chest.  PAF, sinus node dysfunction s/p PPM: In paced/NSR. - Continue amiodarone, cardizem and eliquis.   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 He is s/p left SFA stent placement 05/17/21 by Dr. Trula Slade - Continue ASA and crestor   NIDT2DM: HbA1c 5.7%.  - SSI   HLD:  - Continue statin  Hx prostate CA s/p XRT, chronic indwelling foley catheter - Foley catheter care   Obesity: Estimated body mass index is 32.26 kg/m as calculated from the following:   Height as of this encounter: '5\' 9"'$  (1.753 m).   Weight as of this encounter: 99.1 kg.  Subjective: Still coughing has a wetter quality per SIL than Wednesday. Does not feel that breathing treatments help. Still quite hypoxic at rest and ambulation and does note that he gets winded before he usually does, but doesn't ever feel very short of breath. No chest pain.   Objective: Vitals:   06/24/22 0304 06/24/22 0853 06/24/22 0927 06/24/22 1256  BP: (!) 138/53 (!) 142/68  (!) 144/57  Pulse: 62 60  60  Resp: '18 18  20  '$ Temp: 98.3 F (36.8 C)  98.2 F (36.8 C)  97.8 F (36.6 C)  TempSrc: Oral Oral  Oral  SpO2: 92% 90% 90% 90%  Weight: 99.1 kg     Height:      Gen: 88 y.o. male in no distress Pulm: Nonlabored breathing supplemental oxygen, crackles noted again but decreased intensity. No wheezes. CV: Regular rate and rhythm. No murmur, rub, or gallop. No JVD, no pitting dependent edema. GI: Abdomen soft, non-tender, non-distended, with normoactive bowel sounds.  Ext: Warm, no  deformities. No calf swelling or tenderness Skin: No rashes, lesions or ulcers on visualized skin. Neuro: Alert and oriented. No focal neurological deficits. Psych: Judgement and insight appear fair. Mood euthymic & affect congruent. Behavior is appropriate.    Data Personally reviewed: CBC: Recent Labs  Lab 06/21/22 1526 06/22/22 0430 06/23/22 0500  WBC 10.1 6.3 13.6*  NEUTROABS 7.9*  --  11.7*  HGB 12.8* 11.7* 11.5*  HCT 38.7* 34.1* 34.6*  MCV 93.0 91.2 93.0  PLT 231 219 811   Basic Metabolic Panel: Recent Labs  Lab 06/21/22 1526 06/21/22 2036 06/22/22 1606 06/23/22 0500  NA 136  --  138 140  K 3.2*  --  4.2 3.9  CL 100  --  103 108  CO2 27  --  26 25  GLUCOSE 117*  --  105* 124*  BUN 21  --  28* 27*  CREATININE 1.98*  --  1.82* 1.58*  CALCIUM 8.7*  --  9.1 8.5*  MG  --  2.2  --  2.4   GFR: Estimated Creatinine Clearance: 39 mL/min (A) (by C-G formula based on SCr of 1.58 mg/dL (H)). Liver Function Tests: Recent Labs  Lab 06/21/22 2036  AST 21  ALT 16  ALKPHOS 72  BILITOT 0.8  PROT 7.0  ALBUMIN 2.9*   No results for input(s): "LIPASE", "AMYLASE" in the last 168 hours. No results for input(s): "AMMONIA" in the last 168 hours. Coagulation Profile: No results for input(s): "INR", "PROTIME" in the last 168 hours. Cardiac Enzymes: No results for input(s): "CKTOTAL", "CKMB", "CKMBINDEX", "TROPONINI" in the last 168 hours. BNP (last 3 results) No results for input(s): "PROBNP" in the last 8760 hours. HbA1C: No results for input(s): "HGBA1C" in the last 72 hours. CBG: No results for input(s): "GLUCAP" in the last 168 hours. Lipid Profile: No results for input(s): "CHOL", "HDL", "LDLCALC", "TRIG", "CHOLHDL", "LDLDIRECT" in the last 72 hours. Thyroid Function Tests: No results for input(s): "TSH", "T4TOTAL", "FREET4", "T3FREE", "THYROIDAB" in the last 72 hours. Anemia Panel: No results for input(s): "VITAMINB12", "FOLATE", "FERRITIN", "TIBC", "IRON",  "RETICCTPCT" in the last 72 hours. Urine analysis:    Component Value Date/Time   COLORURINE YELLOW 05/18/2021 1114   APPEARANCEUR TURBID (A) 05/18/2021 1114   LABSPEC 1.010 05/18/2021 1114   PHURINE 8.5 (H) 05/18/2021 1114   GLUCOSEU NEGATIVE 05/18/2021 1114   HGBUR LARGE (A) 05/18/2021 1114   BILIRUBINUR NEGATIVE 05/18/2021 1114   KETONESUR NEGATIVE 05/18/2021 1114   PROTEINUR 100 (A) 05/18/2021 1114   NITRITE NEGATIVE 05/18/2021 1114   LEUKOCYTESUR MODERATE (A) 05/18/2021 1114   Recent Results (from the past 240 hour(s))  Resp Panel by RT-PCR (Flu A&B, Covid) Anterior Nasal Swab     Status: None   Collection Time: 06/21/22 12:24 PM   Specimen: Anterior Nasal Swab  Result Value Ref Range Status   SARS Coronavirus 2 by RT PCR NEGATIVE NEGATIVE Final    Comment: (NOTE) SARS-CoV-2 target nucleic acids are NOT DETECTED.  The SARS-CoV-2 RNA is generally  detectable in upper respiratory specimens during the acute phase of infection. The lowest concentration of SARS-CoV-2 viral copies this assay can detect is 138 copies/mL. A negative result does not preclude SARS-Cov-2 infection and should not be used as the sole basis for treatment or other patient management decisions. A negative result may occur with  improper specimen collection/handling, submission of specimen other than nasopharyngeal swab, presence of viral mutation(s) within the areas targeted by this assay, and inadequate number of viral copies(<138 copies/mL). A negative result must be combined with clinical observations, patient history, and epidemiological information. The expected result is Negative.  Fact Sheet for Patients:  EntrepreneurPulse.com.au  Fact Sheet for Healthcare Providers:  IncredibleEmployment.be  This test is no t yet approved or cleared by the [place] FDA and  has been authorized for detection and/or diagnosis of SARS-CoV-2 by FDA under an Emergency Use  Authorization (EUA). This EUA will remain  in effect (meaning this test can be used) for the duration of the COVID-19 declaration under Section 564(b)(1) of the Act, 21 U.S.C.section 360bbb-3(b)(1), unless the authorization is terminated  or revoked sooner.       Influenza A by PCR NEGATIVE NEGATIVE Final   Influenza B by PCR NEGATIVE NEGATIVE Final    Comment: (NOTE) The Xpert Xpress SARS-CoV-2/FLU/RSV plus assay is intended as an aid in the diagnosis of influenza from Nasopharyngeal swab specimens and should not be used as a sole basis for treatment. Nasal washings and aspirates are unacceptable for Xpert Xpress SARS-CoV-2/FLU/RSV testing.  Fact Sheet for Patients: EntrepreneurPulse.com.au  Fact Sheet for Healthcare Providers: IncredibleEmployment.be  This test is not yet approved or cleared by the [place] FDA and has been authorized for detection and/or diagnosis of SARS-CoV-2 by FDA under an Emergency Use Authorization (EUA). This EUA will remain in effect (meaning this test can be used) for the duration of the COVID-19 declaration under Section 564(b)(1) of the Act, 21 U.S.C. section 360bbb-3(b)(1), unless the authorization is terminated or revoked.  Performed at [facility], [place] [address] [place], [place] [address]   Respiratory (~20 pathogens) panel by PCR     Status: Abnormal   Collection Time: 06/21/22  7:34 PM   Specimen: Nasopharyngeal Swab; Respiratory  Result Value Ref Range Status   Adenovirus NOT DETECTED NOT DETECTED Final   Coronavirus 229E NOT DETECTED NOT DETECTED Final    Comment: (NOTE) The Coronavirus on the Respiratory Panel, DOES NOT test for the novel  Coronavirus (2019 nCoV)    Coronavirus HKU1 NOT DETECTED NOT DETECTED Final   Coronavirus NL63 NOT DETECTED NOT DETECTED Final   Coronavirus OC43 NOT DETECTED NOT DETECTED Final   Metapneumovirus NOT DETECTED NOT DETECTED Final   Rhinovirus /  Enterovirus DETECTED (A) NOT DETECTED Final   Influenza A NOT DETECTED NOT DETECTED Final   Influenza B NOT DETECTED NOT DETECTED Final   Parainfluenza Virus 1 NOT DETECTED NOT DETECTED Final   Parainfluenza Virus 2 NOT DETECTED NOT DETECTED Final   Parainfluenza Virus 3 NOT DETECTED NOT DETECTED Final   Parainfluenza Virus 4 NOT DETECTED NOT DETECTED Final   Respiratory Syncytial Virus NOT DETECTED NOT DETECTED Final   Bordetella pertussis NOT DETECTED NOT DETECTED Final   Bordetella Parapertussis NOT DETECTED NOT DETECTED Final   Chlamydophila pneumoniae NOT DETECTED NOT DETECTED Final   Mycoplasma pneumoniae NOT DETECTED NOT DETECTED Final    Comment: Performed at [facility], [place]. [address] [place], [place] [address]     ECHOCARDIOGRAM  COMPLETE  Result Date: 06/22/2022    ECHOCARDIOGRAM REPORT   Patient Name:   Ernest Parker Date of Exam: 06/22/2022 Medical Rec #:  [id number]     Height:       70.0 in Accession #:    [id number]    Weight:       215.0 lb Date of Birth:  1934/07/21     BSA:          2.152 m Patient Age:    71 years      BP:           128/65 mmHg Patient Gender: M             HR:           77 bpm. Exam Location:  Inpatient Procedure: 2D Echo, Cardiac Doppler and Color Doppler Indications:    A[place]re respiratory distress R06.03                 Cardiomyopathy-Unspecified I42.9                 Heart block, RBBB I45.10  History:        Patient has prior history of Echocardiogram examinations, most                 recent 07/12/2020. Pacemaker, TIA and PAD, Arrythmias:Atrial                 Fibrillation, Signs/Symptoms:Syncope, Dyspnea and Shortness of                 Breath; Risk Factors:Hypertension and Diabetes.  Sonographer:    Ronny Flurry Referring Phys: Blanca  1. Left ventricular ejection fraction, by estimation, is 60 to 65%. The left ventricle has normal function. The left ventricle has no regional wall motion abnormalities. There is mild  concentric left ventricular hypertrophy. Left ventricular diastolic parameters are consistent with Grade I diastolic dysfunction (impaired relaxation). Elevated left ventricular end-diastolic pressure.  2. Right ventricular systolic function is normal. The right ventricular size is normal.  3. Left atrial size was moderately dilated.  4. The mitral valve is normal in structure. Mild mitral valve regurgitation. No evidence of mitral stenosis.  5. The aortic valve is tricuspid. Aortic valve regurgitation is not visualized. No aortic stenosis is present.  6. Aortic dilatation noted. There is mild dilatation of the aortic root, measuring 38 mm. There is moderate dilatation of the ascending aorta, measuring 43 mm.  7. The inferior vena cava is normal in size with greater than 50% respiratory variability, suggesting right atrial pressure of 3 mmHg. FINDINGS  Left Ventricle: Left ventricular ejection fraction, by estimation, is 60 to 65%. The left ventricle has normal function. The left ventricle has no regional wall motion abnormalities. The left ventricular internal cavity size was normal in size. There is  mild concentric left ventricular hypertrophy. Left ventricular diastolic parameters are consistent with Grade I diastolic dysfunction (impaired relaxation). Elevated left ventricular end-diastolic pressure. Right Ventricle: The right ventricular size is normal. No increase in right ventricular wall thickness. Right ventricular systolic function is normal. Left Atrium: Left atrial size was moderately dilated. Right Atrium: Right atrial size was normal in size. Pericardium: There is no evidence of pericardial effusion. Mitral Valve: The mitral valve is normal in structure. Mild mitral valve regurgitation. No evidence of mitral valve stenosis. Tricuspid Valve: The tricuspid valve is normal in structure. Tricuspid valve regurgitation is trivial. No evidence of tricuspid stenosis. Aortic Valve:  The aortic valve is  tricuspid. Aortic valve regurgitation is not visualized. No aortic stenosis is present. Aortic valve mean gradient measures 4.0 mmHg. Aortic valve peak gradient measures 7.2 mmHg. Aortic valve area, by VTI measures 2.75 cm. Pulmonic Valve: The pulmonic valve was normal in structure. Pulmonic valve regurgitation is not visualized. No evidence of pulmonic stenosis. Aorta: Aortic dilatation noted. There is mild dilatation of the aortic root, measuring 38 mm. There is moderate dilatation of the ascending aorta, measuring 43 mm. Venous: The inferior vena cava is normal in size with greater than 50% respiratory variability, suggesting right atrial pressure of 3 mmHg. IAS/Shunts: No atrial level shunt detected by color flow Doppler.  LEFT VENTRICLE PLAX 2D LVIDd:         5.20 cm   Diastology LVIDs:         3.80 cm   LV e' medial:    4.87 cm/s LV PW:         1.30 cm   LV E/e' medial:  15.5 LV IVS:        1.20 cm   LV e' lateral:   8.83 cm/s LVOT diam:     2.10 cm   LV E/e' lateral: 8.6 LV SV:         87 LV SV Index:   40 LVOT Area:     3.46 cm  RIGHT VENTRICLE RV S prime:     9.32 cm/s TAPSE (M-mode): 1.6 cm LEFT ATRIUM             Index        RIGHT ATRIUM           Index LA diam:        5.10 cm 2.37 cm/m   RA Area:     17.00 cm LA Vol (A2C):   73.6 ml 34.19 ml/m  RA Volume:   38.30 ml  17.79 ml/m LA Vol (A4C):   87.8 ml 40.79 ml/m LA Biplane Vol: 81.4 ml 37.82 ml/m  AORTIC VALVE AV Area (Vmax):    2.69 cm AV Area (Vmean):   2.72 cm AV Area (VTI):     2.75 cm AV Vmax:           134.00 cm/s AV Vmean:          92.100 cm/s AV VTI:            0.316 m AV Peak Grad:      7.2 mmHg AV Mean Grad:      4.0 mmHg LVOT Vmax:         104.00 cm/s LVOT Vmean:        72.300 cm/s LVOT VTI:          0.251 m LVOT/AV VTI ratio: 0.79  AORTA Ao Root diam: 3.80 cm Ao Asc diam:  4.30 cm MITRAL VALVE                TRICUSPID VALVE MV Area (PHT): 2.48 cm     TR Peak grad:   34.8 mmHg MV Decel Time: 306 msec     TR Vmax:        295.00  cm/s MV E velocity: 75.70 cm/s MV A velocity: 101.00 cm/s  SHUNTS MV E/A ratio:  0.75         Systemic VTI:  0.25 m  Systemic Diam: 2.10 cm Skeet Latch MD Electronically signed by Skeet Latch MD Signature Date/Time: 06/22/2022/7:40:53 PM    Final     Family Communication: Daughter, son, SIL at bedside  Disposition: Status is: Inpatient Remains inpatient appropriate because: Persistent hypoxia Planned Discharge Destination: Home  Patrecia Pour, MD 06/24/2022 2:35 PM Page by [email or web address]

## 2022-06-25 ENCOUNTER — Inpatient Hospital Stay (HOSPITAL_COMMUNITY): Payer: Medicare Other

## 2022-06-25 DIAGNOSIS — J9601 Acute respiratory failure with hypoxia: Secondary | ICD-10-CM | POA: Diagnosis not present

## 2022-06-25 DIAGNOSIS — Z978 Presence of other specified devices: Secondary | ICD-10-CM | POA: Diagnosis not present

## 2022-06-25 DIAGNOSIS — R609 Edema, unspecified: Secondary | ICD-10-CM

## 2022-06-25 DIAGNOSIS — R0602 Shortness of breath: Secondary | ICD-10-CM | POA: Diagnosis not present

## 2022-06-25 DIAGNOSIS — R7989 Other specified abnormal findings of blood chemistry: Secondary | ICD-10-CM

## 2022-06-25 DIAGNOSIS — N1832 Chronic kidney disease, stage 3b: Secondary | ICD-10-CM | POA: Diagnosis not present

## 2022-06-25 DIAGNOSIS — I10 Essential (primary) hypertension: Secondary | ICD-10-CM | POA: Diagnosis not present

## 2022-06-25 LAB — CBC
HCT: 35 % — ABNORMAL LOW (ref 39.0–52.0)
Hemoglobin: 11.6 g/dL — ABNORMAL LOW (ref 13.0–17.0)
MCH: 30.8 pg (ref 26.0–34.0)
MCHC: 33.1 g/dL (ref 30.0–36.0)
MCV: 92.8 fL (ref 80.0–100.0)
Platelets: 218 10*3/uL (ref 150–400)
RBC: 3.77 MIL/uL — ABNORMAL LOW (ref 4.22–5.81)
RDW: 14.6 % (ref 11.5–15.5)
WBC: 10.7 10*3/uL — ABNORMAL HIGH (ref 4.0–10.5)
nRBC: 0 % (ref 0.0–0.2)

## 2022-06-25 LAB — BASIC METABOLIC PANEL
Anion gap: 8 (ref 5–15)
BUN: 21 mg/dL (ref 8–23)
CO2: 23 mmol/L (ref 22–32)
Calcium: 8 mg/dL — ABNORMAL LOW (ref 8.9–10.3)
Chloride: 106 mmol/L (ref 98–111)
Creatinine, Ser: 1.64 mg/dL — ABNORMAL HIGH (ref 0.61–1.24)
GFR, Estimated: 40 mL/min — ABNORMAL LOW (ref >60)
Glucose, Bld: 136 mg/dL — ABNORMAL HIGH (ref 70–99)
Potassium: 3.8 mmol/L (ref 3.5–5.1)
Sodium: 137 mmol/L (ref 135–145)

## 2022-06-25 MED ORDER — PANTOPRAZOLE SODIUM 40 MG PO TBEC
40.0000 mg | DELAYED_RELEASE_TABLET | Freq: Every day | ORAL | Status: DC
  Administered 2022-06-25 – 2022-07-03 (×9): 40 mg via ORAL
  Filled 2022-06-25 (×9): qty 1

## 2022-06-25 MED ORDER — METHYLPREDNISOLONE SODIUM SUCC 40 MG IJ SOLR
40.0000 mg | Freq: Two times a day (BID) | INTRAMUSCULAR | Status: DC
  Administered 2022-06-25 – 2022-06-26 (×2): 40 mg via INTRAVENOUS
  Filled 2022-06-25 (×2): qty 1

## 2022-06-25 MED ORDER — IOHEXOL 350 MG/ML SOLN
75.0000 mL | Freq: Once | INTRAVENOUS | Status: AC | PRN
  Administered 2022-06-25: 75 mL via INTRAVENOUS

## 2022-06-25 MED ORDER — MONTELUKAST SODIUM 10 MG PO TABS
10.0000 mg | ORAL_TABLET | Freq: Every day | ORAL | Status: DC
  Administered 2022-06-25 – 2022-07-02 (×8): 10 mg via ORAL
  Filled 2022-06-25 (×8): qty 1

## 2022-06-25 MED ORDER — ENSURE ENLIVE PO LIQD
237.0000 mL | Freq: Two times a day (BID) | ORAL | Status: DC
  Administered 2022-06-26 – 2022-07-03 (×14): 237 mL via ORAL

## 2022-06-25 MED ORDER — FLUTICASONE FUROATE-VILANTEROL 100-25 MCG/ACT IN AEPB
1.0000 | INHALATION_SPRAY | Freq: Every day | RESPIRATORY_TRACT | Status: DC
  Administered 2022-07-01 – 2022-07-03 (×3): 1 via RESPIRATORY_TRACT
  Filled 2022-06-25 (×2): qty 28

## 2022-06-25 NOTE — Plan of Care (Signed)

## 2022-06-25 NOTE — Consult Note (Signed)
NAME:  Ernest Parker, MRN:  [id number], DOB:  1934/01/18, LOS: 4 ADMISSION DATE:  06/21/2022, CONSULTATION DATE:  06/25/22 REFERRING MD:  Bonner Puna , CHIEF COMPLAINT:  SOB   History of Present Illness:  88 yo M PMH Afib on amio, CKD III, HTN, SSS s/p PPM, remote tobacco use admitted to [facility] 10/11 for SOB and weakness after first presenting to PCP where he was hypoxic with SpO2 70 on RA. He c/o a dry cough which did not improve with tessalon pearls, and progressive weakness. Did have some URI sx. No sick contacts.He was found to have multifocal airspace disease and positive for rhinovirus. He was hypoxic and required 4LNC.  Azithromycin and Rocephin were added 10/12 for possible superimposed bacterial infection. IV lasix was added 10/13.   O2 needs increased a bit in the coming days. On 10/15, a CTA chest was acquired to r/o PE. There was no evidence of PE but did reveal extensive GGOs and bilateral infiltrates.   PCCM is consulted in this setting.   Normally can do about 15-16 golf holes with cart before getting fatigued. He has a 30 pack year smoking history quit at 63 Worked in a variety of jobs with mold exposure This all seemed to happen suddenly No aspiration symptoms Cough is mostly dry, worse with deep inspiration, comes in paroxysms.  Pertinent  Medical History  Afib Prior tobacco use (30 pack yr hx, quit 40 years ago)  Barrett's esophagus GERD HLD HTN Prostate cancer s/p XRT SSS s/p PPM CKD III PAD Obesity   Significant Hospital Events: Including procedures, antibiotic start and stop dates in addition to other pertinent events   10/11 hypoxic at PCP to 69s. Presented to ED. Admit to [facility] for rhinovirus PNA, hypoxia 10/12 azithro, rocephin added 10/13 Started IV diuresis  10/15 CTA chest no PE, extensive GGOs and bilateral infiltrate. Pulm consult   Interim History / Subjective:  Consulted  Objective   Blood pressure (!) 160/78, pulse 67, temperature 97.6 F (36.4 C),  temperature source Oral, resp. rate 17, height '5\' 9"'$  (1.753 m), weight 99.1 kg, SpO2 (!) 84 %.        Intake/Output Summary (Last 24 hours) at 06/25/2022 1525 Last data filed at 06/25/2022 1211 Gross per 24 hour  Intake 400 ml  Output 1575 ml  Net -1175 ml   Filed Weights   06/24/22 0304  Weight: 99.1 kg    Examination: Well appearing man in no distress Crackles at bases worse on left Ext warm Aox3, good insight Abd soft Posterior pharynx with cobblestoning  Stable CKD, CBC benign  Resolved Hospital Problem list     Assessment & Plan:   Acute hypoxic respiratory failure Rhinovirus PNA Question superimposed COP Baseline abnormal CXR c/w at least beginnings of IPF At risk for amio lung toxicity but timeline not consistent  - IS, flutter - Wean O2 for sats >90% - Start steroids - Ok to continue amio for now - Will follow  [employer] (right click and "Reselect all SmartList Selections" daily)  Per primary  Labs   CBC: Recent Labs  Lab 06/21/22 1526 06/22/22 0430 06/23/22 0500 06/25/22 0514  WBC 10.1 6.3 13.6* 10.7*  NEUTROABS 7.9*  --  11.7*  --   HGB 12.8* 11.7* 11.5* 11.6*  HCT 38.7* 34.1* 34.6* 35.0*  MCV 93.0 91.2 93.0 92.8  PLT 231 219 265 798    Basic Metabolic Panel: Recent Labs  Lab 06/21/22 1526 06/21/22 2036 06/22/22 1606 06/23/22 0500 06/25/22  0514  NA 136  --  138 140 137  K 3.2*  --  4.2 3.9 3.8  CL 100  --  103 108 106  CO2 27  --  '26 25 23  '$ GLUCOSE 117*  --  105* 124* 136*  BUN 21  --  28* 27* 21  CREATININE 1.98*  --  1.82* 1.58* 1.64*  CALCIUM 8.7*  --  9.1 8.5* 8.0*  MG  --  2.2  --  2.4  --    GFR: Estimated Creatinine Clearance: 37.5 mL/min (A) (by C-G formula based on SCr of 1.64 mg/dL (H)). Recent Labs  Lab 06/21/22 1526 06/21/22 2036 06/22/22 0430 06/23/22 0500 06/25/22 0514  PROCALCITON  --  0.15  --   --   --   WBC 10.1  --  6.3 13.6* 10.7*    Liver Function Tests: Recent Labs  Lab 06/21/22 2036   AST 21  ALT 16  ALKPHOS 72  BILITOT 0.8  PROT 7.0  ALBUMIN 2.9*   No results for input(s): "LIPASE", "AMYLASE" in the last 168 hours. No results for input(s): "AMMONIA" in the last 168 hours.  ABG    Component Value Date/Time   TCO2 28 05/17/2021 0740     Coagulation Profile: No results for input(s): "INR", "PROTIME" in the last 168 hours.  Cardiac Enzymes: No results for input(s): "CKTOTAL", "CKMB", "CKMBINDEX", "TROPONINI" in the last 168 hours.  HbA1C: Hgb A1c MFr Bld  Date/Time Value Ref Range Status  07/01/2018 02:45 PM 5.7 (H) 4.8 - 5.6 % Final    Comment:    (NOTE)         Prediabetes: 5.7 - 6.4         Diabetes: >6.4         Glycemic control for adults with diabetes: <7.0   11/08/2017 09:43 AM 5.8 (H) <5.7 % of total Hgb Final    Comment:    For someone without known diabetes, a hemoglobin  A1c value between 5.7% and 6.4% is consistent with prediabetes and should be confirmed with a  follow-up test. . For someone with known diabetes, a value <7% indicates that their diabetes is well controlled. A1c targets should be individualized based on duration of diabetes, age, comorbid conditions, and other considerations. . This assay result is consistent with an increased risk of diabetes. . Currently, no consensus exists regarding use of hemoglobin A1c for diagnosis of diabetes for children. .     CBG: No results for input(s): "GLUCAP" in the last 168 hours.  Review of Systems:    Positive Symptoms in bold:  Constitutional fevers, chills, weight loss, fatigue, anorexia, malaise  Eyes decreased vision, double vision, eye irritation  Ears, Nose, Mouth, Throat sore throat, trouble swallowing, sinus congestion  Cardiovascular chest pain, paroxysmal nocturnal dyspnea, lower ext edema, palpitations   Respiratory SOB, cough, DOE, hemoptysis, wheezing  Gastrointestinal nausea, vomiting, diarrhea  Genitourinary burning with urination, trouble urinating   Musculoskeletal joint aches, joint swelling, back pain  Integumentary  rashes, skin lesions  Neurological focal weakness, focal numbness, trouble speaking, headaches  Psychiatric depression, anxiety, confusion  Endocrine polyuria, polydipsia, cold intolerance, heat intolerance  Hematologic abnormal bruising, abnormal bleeding, unexplained nose bleeds  Allergic/Immunologic recurrent infections, hives, swollen lymph nodes     Past Medical History:  He,  has a past medical history of A-fib ([place]), Barrett's esophagus, Dizzy (resolved), Dyspnea, GERD (gastroesophageal reflux disease), Hard of hearing, High cholesterol, Hypertension, Presence of permanent cardiac pacemaker (12/17/2017), Prostate cancer (  [place]) (2002), and SOB (shortness of breath).   Surgical History:   Past Surgical History:  Procedure Laterality Date   ABDOMINAL AORTOGRAM W/LOWER EXTREMITY Bilateral 05/17/2021   Procedure: ABDOMINAL AORTOGRAM W/LOWER EXTREMITY;  Surgeon: Serafina Mitchell, MD;  Location: [facility];  Service: Cardiovascular;  Laterality: Bilateral;   BALLOON DILATION N/A 09/05/2018   Procedure: BALLOON DILATION;  Surgeon: Milus Banister, MD;  Location: [facility];  Service: Endoscopy;  Laterality: N/A;   BILIARY STENT PLACEMENT  07/09/2018   Procedure: BILIARY STENT PLACEMENT;  Surgeon: Milus Banister, MD;  Location: [facility] ENDOSCOPY;  Service: Endoscopy;;   BILIARY STENT PLACEMENT N/A 09/05/2018   Procedure: BILIARY STENT PLACEMENT;  Surgeon: Milus Banister, MD;  Location: [facility];  Service: Endoscopy;  Laterality: N/A;   BIOPSY  07/05/2018   Procedure: BIOPSY;  Surgeon: Rush Landmark Telford Nab., MD;  Location: [facility] ENDOSCOPY;  Service: Gastroenterology;;   CHOLECYSTECTOMY     2020 late april or early may   ENDOSCOPIC RETROGRADE CHOLANGIOPANCREATOGRAPHY (ERCP) WITH PROPOFOL N/A 09/05/2018   Procedure: ENDOSCOPIC RETROGRADE CHOLANGIOPANCREATOGRAPHY (ERCP) WITH PROPOFOL;  Surgeon: Milus Banister, MD;  Location: [facility];  Service: Endoscopy;  Laterality: N/A;   ERCP N/A 07/09/2018   Procedure: ENDOSCOPIC RETROGRADE CHOLANGIOPANCREATOGRAPHY (ERCP);  Surgeon: Milus Banister, MD;  Location: [facility] ENDOSCOPY;  Service: Endoscopy;  Laterality: N/A;   ESOPHAGOGASTRODUODENOSCOPY (EGD) WITH PROPOFOL N/A 07/05/2018   Procedure: ESOPHAGOGASTRODUODENOSCOPY (EGD) WITH PROPOFOL;  Surgeon: Rush Landmark Telford Nab., MD;  Location: [place];  Service: Gastroenterology;  Laterality: N/A;   EUS  07/05/2018   Procedure: FULL UPPER ENDOSCOPIC ULTRASOUND (EUS) RADIAL;  Surgeon: Rush Landmark Telford Nab., MD;  Location: [facility];  Service: Gastroenterology;;   INGUINAL HERNIA REPAIR Bilateral    INSERT / REPLACE / [place]  12/17/2017   IR CHOLANGIOGRAM EXISTING TUBE  09/10/2018   IR EXCHANGE BILIARY DRAIN  08/14/2018   IR EXCHANGE BILIARY DRAIN  10/08/2018   IR PERC CHOLECYSTOSTOMY  07/10/2018   PACEMAKER IMPLANT N/A 12/17/2017   Procedure: PACEMAKER IMPLANT;  Surgeon: Evans Lance, MD;  Location: [place] [facility];  Service: Cardiovascular;  Laterality: N/A;   PERIPHERAL VASCULAR INTERVENTION Left 05/17/2021   Procedure: PERIPHERAL VASCULAR INTERVENTION;  Surgeon: Serafina Mitchell, MD;  Location: [place] [facility];  Service: Cardiovascular;  Laterality: Left;  superficial femoral   PROSTATE BIOPSY     REMOVAL OF STONES  07/09/2018   Procedure: REMOVAL OF STONES;  Surgeon: Milus Banister, MD;  Location: [facility] ENDOSCOPY;  Service: Endoscopy;;   SPHINCTEROTOMY  07/09/2018   Procedure: [name];  Surgeon: Milus Banister, MD;  Location[facility] ENDOSCOPY;  Service: Endoscopy;;   SPHINCTEROTOMY  09/05/2018   Procedure: [name];  Surgeon: Milus Banister, MD;  Location: [facility];  Service: Endoscopy;;   STENT REMOVAL  09/05/2018   Procedure: STENT REMOVAL;  Surgeon: Milus Banister, MD;  Location: [facility];  Service: Endoscopy;;   STONE EXTRACTION WITH BASKET  09/05/2018    Procedure: STONE EXTRACTION WITH BASKET;  Surgeon: Milus Banister, MD;  Location: [facility];  Service: Endoscopy;;   THORACIC AORTIC ANEURYSM REPAIR  03/2018   TRANSURETHRAL RESECTION OF PROSTATE N/A 03/10/2019   Procedure: TRANSURETHRAL RESECTION OF THE PROSTATE (TURP);  Surgeon: Lucas Mallow, MD;  Location: [facility];  Service: Urology;  Laterality: N/A;     Social History:   reports that he quit smoking about 43 years ago. His smoking use included cigarettes. He has a 15.00  pack-year smoking history. He has never used smokeless tobacco. He reports that he does not currently use alcohol after a past usage of about 1.0 standard drink of alcohol per week. He reports that he does not use drugs.   Family History:  His family history includes Hypertension in his mother; Kidney disease in his sister; Sleep apnea in his sister; Tuberculosis in his father.   Allergies Allergies  Allergen Reactions   Penicillins Other (See Comments)    Patient can't remember reaction was told by mother he had a SERIOUS allergic rxn as a child  DID THE REACTION INVOLVE: Swelling of the face/tongue/throat, SOB, or low BP? Unknown Sudden or severe rash/hives, skin peeling, or the inside of the mouth or nose? Unknown Did it require medical treatment? Unknown When did it last happen?      Childhood allergy  If all above answers are "NO", may proceed with cephalosporin use.      Home Medications  Prior to Admission medications   Medication Sig Start Date End Date Taking? Authorizing Provider  amiodarone (PACERONE) 200 MG tablet Take 1 tablet (200 mg total) by mouth daily. 08/31/21  Yes Evans Lance, MD  ASPIRIN 81 PO Take 81 mg by mouth daily.   Yes [provider]  benzonatate (TESSALON) 100 MG capsule Take 100 mg by mouth 3 (three) times daily. 06/15/22  Yes [provider]  Cholecalciferol (VITAMIN D3) 2000 units TABS Take 2,000 Units by mouth daily.    Yes [provider]   diltiazem (CARDIZEM CD) 240 MG 24 hr capsule TAKE 1 CAPSULE BY MOUTH EVERY DAY Patient taking differently: Take 240 mg by mouth daily. 04/07/22  Yes Evans Lance, MD  ELIQUIS 2.5 MG TABS tablet TAKE 1 TABLET BY MOUTH TWICE A DAY Patient taking differently: Take 2.5 mg by mouth 2 (two) times daily. 03/20/22  Yes Evans Lance, MD  furosemide (LASIX) 40 MG tablet TAKE 1 TABLET BY MOUTH TWICE A DAY Patient taking differently: Take 40 mg by mouth 2 (two) times daily. 12/14/21  Yes Evans Lance, MD  Polyvinyl Alcohol-Povidone (REFRESH OP) Place 1 drop into both eyes 2 (two) times daily as needed (dry eyes).   Yes [provider]  rosuvastatin (CRESTOR) 10 MG tablet Take 10 mg by mouth daily. 01/13/22  Yes [provider]    Erskine Emery MD PCCM [place] for pager  06/25/2022, 3:28 PM

## 2022-06-25 NOTE — Progress Notes (Signed)
Progress Note  Patient: Ernest Parker VVO:[id number] DOB: 1933-12-06  DOA: 06/21/2022  DOS: 06/25/2022    Brief hospital course: CARRIE SCHOONMAKER is an 88 y.o. male with a history of AFib, stage III CKD, HTN, HLD, SSS s/p PPM, prostate CA s/p XRT, T2DM, chronic indwelling foley, remote tobacco use, and GERD who presented to the ED with increasing shortness of breath in the setting of rhinorrhea, congestion and cough associated with diffuse weakness. He was found to be hypoxemic with multifocal pneumonia detailed on CXR. Subsequent virus panel positive for rhinovirus.   Assessment and Plan: Acute hypoxic respiratory failure due to rhinovirus multifocal pneumonia: - CTA chest did not show PE but there are extensive interstitial and alveolar infiltrates bilaterally, some LNs. This would be one of the more severe non-covid viral pneumonias I've seen. Is there a component of ILD (symptoms relatively abrupt for that but he's currently hypoxemic out of proportion to his dyspnea, so ?more chronic). I would appreciate PCCM opinion. ?if role for steroid or augmented diuresis.  - Continue supplemental oxygen, wean as tolerated.  - Mobilize frequently, continue IS - We've continued empiric antibiotics, though suspicion for bacterial pneumonia is low.  - Sputum Cx if able to provide sample - Given high dose steroid initially. He does have remote smoking hx (20 pack years) but no wheezing or Dx COPD, doesn't feel bronchodilators helped symptoms.  - Continue antitussives.   - Continue lasix, may challenge with augmented diuresis.  Chronic HFpEF: LVEF 60-65%, G1DD, mod LAE - Held lasix for AKI, Cr improved, elevated LVEDP, so restarted home lasix '40mg'$  po BID. - Follow up aortic dilatation by echo.   Hypokalemia: Resolved with supplementation.  - Monitor.   Leukocytosis: Developed after steroids.  AKI on stage IIIb CKD: Based on available values. - Monitor in AM after CTA chest.  PAF, sinus node  dysfunction s/p PPM: In paced/NSR. - Continue amiodarone, cardizem and eliquis.   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 He is s/p left SFA stent placement 05/17/21 by Dr. Trula Slade - Continue ASA and crestor   NIDT2DM: HbA1c 5.7%.  - SSI   HLD:  - Continue statin  Hx prostate CA s/p XRT, chronic indwelling foley catheter - Foley catheter care   elevated d-dimer: Negative LE venous U/S and CTA chest PE study.   Obesity: Estimated body mass index is 32.26 kg/m as calculated from the following:   Height as of this encounter: '5\' 9"'$  (1.753 m).   Weight as of this encounter: 99.1 kg.  Subjective: Still with cough, looks better to family today. Got up yesterday to ambulate, not yet as of this morning. Denies chest pain or fevers.   Objective: Vitals:   06/25/22 0416 06/25/22 0838 06/25/22 1131 06/25/22 1554  BP: (!) 153/65 (!) 113/57 (!) 160/78 (!) 151/64  Pulse: 62 88 67 63  Resp: '19 19 17 19  '$ Temp: 98 F (36.7 C) 98.6 F (37 C) 97.6 F (36.4 C) 98.3 F (36.8 C)  TempSrc: Oral Oral Oral Oral  SpO2: 91% 91% (!) 84% 90%  Weight:      Height:      Gen: 88 y.o. male in no distress Pulm: Nonlabored breathing supplemental oxygen, diffuse crackles without wheezes. CV: Regular rate and rhythm. No murmur, rub, or gallop. No  JVD, no significant dependent edema. GI: Abdomen soft, non-tender, non-distended, with normoactive bowel sounds.  Ext: Warm, no deformities Skin: No rashes, lesions or ulcers on visualized skin. Neuro: Alert and oriented. No focal neurological deficits. Psych: Judgement and insight appear fair. Mood euthymic & affect congruent. Behavior is appropriate.    Data Personally reviewed: CBC: Recent Labs  Lab 06/21/22 1526 06/22/22 0430 06/23/22 0500 06/25/22 0514   WBC 10.1 6.3 13.6* 10.7*  NEUTROABS 7.9*  --  11.7*  --   HGB 12.8* 11.7* 11.5* 11.6*  HCT 38.7* 34.1* 34.6* 35.0*  MCV 93.0 91.2 93.0 92.8  PLT 231 219 265 025   Basic Metabolic Panel: Recent Labs  Lab 06/21/22 1526 06/21/22 2036 06/22/22 1606 06/23/22 0500 06/25/22 0514  NA 136  --  138 140 137  K 3.2*  --  4.2 3.9 3.8  CL 100  --  103 108 106  CO2 27  --  '26 25 23  '$ GLUCOSE 117*  --  105* 124* 136*  BUN 21  --  28* 27* 21  CREATININE 1.98*  --  1.82* 1.58* 1.64*  CALCIUM 8.7*  --  9.1 8.5* 8.0*  MG  --  2.2  --  2.4  --    GFR: Estimated Creatinine Clearance: 37.5 mL/min (A) (by C-G formula based on SCr of 1.64 mg/dL (H)). Liver Function Tests: Recent Labs  Lab 06/21/22 2036  AST 21  ALT 16  ALKPHOS 72  BILITOT 0.8  PROT 7.0  ALBUMIN 2.9*   No results for input(s): "LIPASE", "AMYLASE" in the last 168 hours. No results for input(s): "AMMONIA" in the last 168 hours. Coagulation Profile: No results for input(s): "INR", "PROTIME" in the last 168 hours. Cardiac Enzymes: No results for input(s): "CKTOTAL", "CKMB", "CKMBINDEX", "TROPONINI" in the last 168 hours. BNP (last 3 results) No results for input(s): "PROBNP" in the last 8760 hours. HbA1C: No results for input(s): "HGBA1C" in the last 72 hours. CBG: No results for input(s): "GLUCAP" in the last 168 hours. Lipid Profile: No results for input(s): "CHOL", "HDL", "LDLCALC", "TRIG", "CHOLHDL", "LDLDIRECT" in the last 72 hours. Thyroid Function Tests: No results for input(s): "TSH", "T4TOTAL", "FREET4", "T3FREE", "THYROIDAB" in the last 72 hours. Anemia Panel: No results for input(s): "VITAMINB12", "FOLATE", "FERRITIN", "TIBC", "IRON", "RETICCTPCT" in the last 72 hours. Urine analysis:    Component Value Date/Time   COLORURINE YELLOW 05/18/2021 1114   APPEARANCEUR TURBID (A) 05/18/2021 1114   LABSPEC 1.010 05/18/2021 1114   PHURINE 8.5 (H) 05/18/2021 1114   GLUCOSEU NEGATIVE 05/18/2021 1114   HGBUR LARGE  (A) 05/18/2021 1114   BILIRUBINUR NEGATIVE 05/18/2021 1114   KETONESUR NEGATIVE 05/18/2021 1114   PROTEINUR 100 (A) 05/18/2021 1114   NITRITE NEGATIVE 05/18/2021 1114   LEUKOCYTESUR MODERATE (A) 05/18/2021 1114   Recent Results (from the past 240 hour(s))  Resp Panel by RT-PCR (Flu A&B, Covid) Anterior Nasal Swab     Status: None   Collection Time: 06/21/22 12:24 PM   Specimen: Anterior Nasal Swab  Result Value Ref Range Status   SARS Coronavirus 2 by RT PCR NEGATIVE NEGATIVE Final    Comment: (NOTE) SARS-CoV-2 target nucleic acids are NOT DETECTED.  The SARS-CoV-2 RNA is generally detectable in upper respiratory specimens during the acute phase of infection. The lowest concentration of SARS-CoV-2 viral copies this assay can detect is 138 copies/mL. A negative result does not preclude SARS-Cov-2 infection and should not be used as the sole basis for treatment or other patient management  decisions. A negative result may occur with  improper specimen collection/handling, submission of specimen other than nasopharyngeal swab, presence of viral mutation(s) within the areas targeted by this assay, and inadequate number of viral copies(<138 copies/mL). A negative result must be combined with clinical observations, patient history, and epidemiological information. The expected result is Negative.  Fact Sheet for Patients:  EntrepreneurPulse.com.au  Fact Sheet for Healthcare Providers:  IncredibleEmployment.be  This test is no t yet approved or cleared by the [place] FDA and  has been authorized for detection and/or diagnosis of SARS-CoV-2 by FDA under an Emergency Use Authorization (EUA). This EUA will remain  in effect (meaning this test can be used) for the duration of the COVID-19 declaration under Section 564(b)(1) of the Act, 21 U.S.C.section 360bbb-3(b)(1), unless the authorization is terminated  or revoked sooner.       Influenza A  by PCR NEGATIVE NEGATIVE Final   Influenza B by PCR NEGATIVE NEGATIVE Final    Comment: (NOTE) The Xpert Xpress SARS-CoV-2/FLU/RSV plus assay is intended as an aid in the diagnosis of influenza from Nasopharyngeal swab specimens and should not be used as a sole basis for treatment. Nasal washings and aspirates are unacceptable for Xpert Xpress SARS-CoV-2/FLU/RSV testing.  Fact Sheet for Patients: EntrepreneurPulse.com.au  Fact Sheet for Healthcare Providers: IncredibleEmployment.be  This test is not yet approved or cleared by the [place] FDA and has been authorized for detection and/or diagnosis of SARS-CoV-2 by FDA under an Emergency Use Authorization (EUA). This EUA will remain in effect (meaning this test can be used) for the duration of the COVID-19 declaration under Section 564(b)(1) of the Act, 21 U.S.C. section 360bbb-3(b)(1), unless the authorization is terminated or revoked.  Performed at [facility], [place] [address], [place], [place] [address]   Respiratory (~20 pathogens) panel by PCR     Status: Abnormal   Collection Time: 06/21/22  7:34 PM   Specimen: Nasopharyngeal Swab; Respiratory  Result Value Ref Range Status   Adenovirus NOT DETECTED NOT DETECTED Final   Coronavirus 229E NOT DETECTED NOT DETECTED Final    Comment: (NOTE) The Coronavirus on the Respiratory Panel, DOES NOT test for the novel  Coronavirus (2019 nCoV)    Coronavirus HKU1 NOT DETECTED NOT DETECTED Final   Coronavirus NL63 NOT DETECTED NOT DETECTED Final   Coronavirus OC43 NOT DETECTED NOT DETECTED Final   Metapneumovirus NOT DETECTED NOT DETECTED Final   Rhinovirus / Enterovirus DETECTED (A) NOT DETECTED Final   Influenza A NOT DETECTED NOT DETECTED Final   Influenza B NOT DETECTED NOT DETECTED Final   Parainfluenza Virus 1 NOT DETECTED NOT DETECTED Final   Parainfluenza Virus 2 NOT DETECTED NOT DETECTED Final   Parainfluenza Virus 3 NOT  DETECTED NOT DETECTED Final   Parainfluenza Virus 4 NOT DETECTED NOT DETECTED Final   Respiratory Syncytial Virus NOT DETECTED NOT DETECTED Final   Bordetella pertussis NOT DETECTED NOT DETECTED Final   Bordetella Parapertussis NOT DETECTED NOT DETECTED Final   Chlamydophila pneumoniae NOT DETECTED NOT DETECTED Final   Mycoplasma pneumoniae NOT DETECTED NOT DETECTED Final    Comment: Performed at [facility], [place]. [address], [place], [place] [address]     VAS [place] LOWER EXTREMITY VENOUS (DVT)  Result Date: 06/25/2022  Lower Venous DVT Study Patient Name:  RISHIKESH KHACHATRYAN  Date of Exam:   06/25/2022 Medical Rec #: [id number]      Accession #:    [id number] Date of Birth: Nov 13, 1933  Patient Gender: M Patient Age:   38 years Exam Location:  [facility] Procedure:      VAS [place] LOWER EXTREMITY VENOUS (DVT) Referring Phys: Vance Gather --------------------------------------------------------------------------------  Indications: Edema, SOB, and Elevated D-Dimer, pneumonia, rhinovirus, atrial fibrillation.  Comparison Study: No prior study on file Performing Technologist: Sharion Dove RVS  Examination Guidelines: A complete evaluation includes B-mode imaging, spectral Doppler, color Doppler, and power Doppler as needed of all accessible portions of each vessel. Bilateral testing is considered an integral part of a complete examination. Limited examinations for reoccurring indications may be performed as noted. The reflux portion of the exam is performed with the patient in reverse Trendelenburg.  +---------+---------------+---------+-----------+----------+--------------+ RIGHT    CompressibilityPhasicitySpontaneityPropertiesThrombus Aging +---------+---------------+---------+-----------+----------+--------------+ CFV      Full           Yes      Yes                                 +---------+---------------+---------+-----------+----------+--------------+ SFJ      Full                                                         +---------+---------------+---------+-----------+----------+--------------+ FV Prox  Full                                                        +---------+---------------+---------+-----------+----------+--------------+ FV Mid   Full                                                        +---------+---------------+---------+-----------+----------+--------------+ FV DistalFull                                                        +---------+---------------+---------+-----------+----------+--------------+ PFV      Full                                                        +---------+---------------+---------+-----------+----------+--------------+ POP      Full           Yes      Yes                                 +---------+---------------+---------+-----------+----------+--------------+ PTV      Full                                                        +---------+---------------+---------+-----------+----------+--------------+  PERO     Full                                                        +---------+---------------+---------+-----------+----------+--------------+   +---------+---------------+---------+-----------+----------+--------------+ LEFT     CompressibilityPhasicitySpontaneityPropertiesThrombus Aging +---------+---------------+---------+-----------+----------+--------------+ CFV      Full           Yes      Yes                                 +---------+---------------+---------+-----------+----------+--------------+ SFJ      Full                                                        +---------+---------------+---------+-----------+----------+--------------+ FV Prox  Full                                                        +---------+---------------+---------+-----------+----------+--------------+ FV Mid   Full                                                         +---------+---------------+---------+-----------+----------+--------------+ FV DistalFull                                                        +---------+---------------+---------+-----------+----------+--------------+ PFV      Full                                                        +---------+---------------+---------+-----------+----------+--------------+ POP      Full           Yes      Yes                                 +---------+---------------+---------+-----------+----------+--------------+ PTV      Full                                                        +---------+---------------+---------+-----------+----------+--------------+ PERO     Full                                                        +---------+---------------+---------+-----------+----------+--------------+  Summary: BILATERAL: - No evidence of deep vein thrombosis seen in the lower extremities, bilaterally. -No evidence of popliteal cyst, bilaterally.   *See table(s) above for measurements and observations. Electronically signed by Monica Martinez MD on 06/25/2022 at 11:51:38 AM.    Final    CT Angio Chest Pulmonary Embolism (PE) W or WO Contrast  Result Date: 06/25/2022 CLINICAL DATA:  Positive D-dimer, left shoulder pain EXAM: CT ANGIOGRAPHY CHEST WITH CONTRAST TECHNIQUE: Multidetector CT imaging of the chest was performed using the standard protocol during bolus administration of intravenous contrast. Multiplanar CT image reconstructions and MIPs were obtained to evaluate the vascular anatomy. RADIATION DOSE REDUCTION: This exam was performed according to the departmental dose-optimization program which includes automated exposure control, adjustment of the mA and/or kV according to patient size and/or use of iterative reconstruction technique. CONTRAST:  50m OMNIPAQUE IOHEXOL 350 MG/ML SOLN COMPARISON:  Previous studies including the chest radiographs done on 06/22/2022 FINDINGS:  Cardiovascular: Significant atherosclerotic changes are noted in thoracic aorta with calcifications and plaques. There are no intraluminal filling defects in central pulmonary artery branches. Evaluation of small peripheral branches is limited by motion artifacts and infiltrates. Coronary artery calcifications are seen. Pacemaker battery is seen in left infraclavicular region. Mediastinum/Nodes: There are enlarged lymph nodes in mediastinum measuring up to 1.5 cm in maximum diameter. Subcentimeter nodes are seen in both hilar regions. Lungs/Pleura: Extensive ground-glass and alveolar infiltrates in both lungs. Findings are more prominent in the upper and mid lung fields. Minimal right pleural effusion is seen. There is no pneumothorax. Upper Abdomen: Air is noted in the bile ducts suggesting previous sphincterotomy. Surgical clips are seen in gallbladder fossa. There is 1.6 cm fluid density lesion in the liver suggesting possible hepatic cyst. Small hiatal hernia is seen. Musculoskeletal: No acute findings are seen. Review of the MIP images confirms the above findings. IMPRESSION: There is no evidence of central pulmonary artery embolism. Evaluation of small peripheral branches is limited by motion artifacts and infiltrates. Arteriosclerotic changes are noted in thoracic aorta. Coronary artery calcifications are seen. Extensive patchy infiltrates are seen in both lungs suggesting multifocal pneumonia. Possibility of underlying interstitial edema and underlying scarring is not excluded. There is minimal right pleural effusion. Small hiatal hernia. Pneumobilia suggests possible previous sphincterotomy. Electronically Signed   By: PElmer PickerM.D.   On: 06/25/2022 10:48    Family Communication: Daughter, son at bedside  Disposition: Status is: Inpatient Remains inpatient appropriate because: Persistent hypoxia Planned Discharge Destination: Home  RPatrecia Pour MD 06/25/2022 4:42 PM Page by aShea [email or web address]

## 2022-06-25 NOTE — Progress Notes (Signed)
VASCULAR LAB    Bilateral lower extremity venous duplex has been performed.  See CV proc for preliminary results.   Iran Rowe, RVT 06/25/2022, 10:10 AM

## 2022-06-25 NOTE — Plan of Care (Signed)
  Problem: Activity: Goal: Ability to tolerate increased activity will improve 06/25/2022 1930 by Laure Kidney, RN Outcome: Progressing 06/25/2022 1929 by Laure Kidney, RN Outcome: Progressing   Problem: Clinical Measurements: Goal: Ability to maintain a body temperature in the normal range will improve 06/25/2022 1930 by Laure Kidney, RN Outcome: Progressing 06/25/2022 1929 by Laure Kidney, RN Outcome: Progressing   Problem: Respiratory: Goal: Ability to maintain adequate ventilation will improve 06/25/2022 1930 by Laure Kidney, RN Outcome: Progressing 06/25/2022 1929 by Laure Kidney, RN Outcome: Progressing Goal: Ability to maintain a clear airway will improve 06/25/2022 1930 by Laure Kidney, RN Outcome: Progressing 06/25/2022 1929 by Laure Kidney, RN Outcome: Progressing   Problem: Education: Goal: Knowledge of General Education information will improve Description: Including pain rating scale, medication(s)/side effects and non-pharmacologic comfort measures 06/25/2022 1930 by Laure Kidney, RN Outcome: Progressing 06/25/2022 1929 by Laure Kidney, RN Outcome: Progressing   Problem: Health Behavior/Discharge Planning: Goal: Ability to manage health-related needs will improve 06/25/2022 1930 by Laure Kidney, RN Outcome: Progressing 06/25/2022 1929 by Laure Kidney, RN Outcome: Progressing   Problem: Clinical Measurements: Goal: Ability to maintain clinical measurements within normal limits will improve 06/25/2022 1930 by Laure Kidney, RN Outcome: Progressing 06/25/2022 1929 by Laure Kidney, RN Outcome: Progressing Goal: Will remain free from infection 06/25/2022 1930 by Laure Kidney, RN Outcome: Progressing 06/25/2022 1929 by Laure Kidney, RN Outcome: Progressing Goal: Diagnostic test results will improve 06/25/2022 1930 by Laure Kidney, RN Outcome: Progressing 06/25/2022 1929 by Laure Kidney, RN Outcome: Progressing Goal:  Respiratory complications will improve 06/25/2022 1930 by Laure Kidney, RN Outcome: Progressing 06/25/2022 1929 by Laure Kidney, RN Outcome: Progressing Goal: Cardiovascular complication will be avoided 06/25/2022 1930 by Laure Kidney, RN Outcome: Progressing 06/25/2022 1929 by Laure Kidney, RN Outcome: Progressing   Problem: Activity: Goal: Risk for activity intolerance will decrease 06/25/2022 1930 by Laure Kidney, RN Outcome: Progressing 06/25/2022 1929 by Laure Kidney, RN Outcome: Progressing   Problem: Nutrition: Goal: Adequate nutrition will be maintained 06/25/2022 1930 by Laure Kidney, RN Outcome: Progressing 06/25/2022 1929 by Laure Kidney, RN Outcome: Progressing   Problem: Coping: Goal: Level of anxiety will decrease 06/25/2022 1930 by Laure Kidney, RN Outcome: Progressing 06/25/2022 1929 by Laure Kidney, RN Outcome: Progressing   Problem: Elimination: Goal: Will not experience complications related to bowel motility 06/25/2022 1930 by Laure Kidney, RN Outcome: Progressing 06/25/2022 1929 by Laure Kidney, RN Outcome: Progressing Goal: Will not experience complications related to urinary retention 06/25/2022 1930 by Laure Kidney, RN Outcome: Progressing 06/25/2022 1929 by Laure Kidney, RN Outcome: Progressing   Problem: Pain Managment: Goal: General experience of comfort will improve 06/25/2022 1930 by Laure Kidney, RN Outcome: Progressing 06/25/2022 1929 by Laure Kidney, RN Outcome: Progressing   Problem: Safety: Goal: Ability to remain free from injury will improve 06/25/2022 1930 by Laure Kidney, RN Outcome: Progressing 06/25/2022 1929 by Laure Kidney, RN Outcome: Progressing   Problem: Skin Integrity: Goal: Risk for impaired skin integrity will decrease 06/25/2022 1930 by Laure Kidney, RN Outcome: Progressing 06/25/2022 1929 by Laure Kidney, RN Outcome: Progressing

## 2022-06-25 NOTE — Progress Notes (Signed)
Physical Therapy Treatment Patient Details Name: Ernest Parker MRN: [id number] DOB: April 14, 1934 Today's Date: 06/25/2022   History of Present Illness 88 y.o. male  who presented to ED 06/21/22 with complaints of shortness of breath. +pna;  PMH significant of atrial fibrillation, CKD stage 3, GERD, HLD, HTN, sinus node dysfunction s/p PPM, prostate cancer s/p radiation, T2DM, chronic indwelling foley.    PT Comments    Pt remains significantly limited by cardiorespiratory endurance at this time. He demos good balance to complete sit-stand transfers and gait without need for UE support or assist, but is unable to tolerate progression of ambulation distance as evidenced by SpO2 dropping to low of 69% on 10L. The pt requires prolonged seated rest and cues for PLB to recover to 90s, RN present and aware. Will continue to benefit from skilled PT acutely to further progress activity tolerance.    Recommendations for follow up therapy are one component of a multi-disciplinary discharge planning process, led by the attending physician.  Recommendations may be updated based on patient status, additional functional criteria and insurance authorization.  Follow Up Recommendations  No PT follow up     Assistance Recommended at Discharge Intermittent Supervision/Assistance  Patient can return home with the following Assistance with cooking/housework;Help with stairs or ramp for entrance   Equipment Recommendations  None recommended by PT    Recommendations for Other Services       Precautions / Restrictions Precautions Precautions: Other (comment) Precaution Comments: desaturates quickly Restrictions Weight Bearing Restrictions: No     Mobility  Bed Mobility Overal bed mobility: Independent                  Transfers Overall transfer level: Modified independent Equipment used: None                    Ambulation/Gait Ambulation/Gait assistance: Min guard Gait Distance  (Feet): 65 Feet (+ 158f) Assistive device: None Gait Pattern/deviations: Step-through pattern, Staggering left Gait velocity: decreased Gait velocity interpretation: <1.31 ft/sec, indicative of household ambulator   General Gait Details: pt able to ambulate ~65 ft with SpO2 maintained >89% on 10L, then progressed to continuous bout of ~1240fwith 10L and SpO2 dropped to low of 69% and required 10 min seated rest to recover. Eventually reduced to 8L with seated rest and maintaining 89-92%     Balance Overall balance assessment: Mild deficits observed, not formally tested                                          Cognition Arousal/Alertness: Awake/alert Behavior During Therapy: WFL for tasks assessed/performed Overall Cognitive Status: Within Functional Limits for tasks assessed                                 General Comments: pt able to follow all cues and answer all questions appropriately        Exercises      General Comments General comments (skin integrity, edema, etc.): SpO2 at 84% on arrival with 7L, pt increased to 8L on O2 tank and recovered to low 90s. dropped again to 70s with short bout of activity and then increased to 10L on O2 tank for gait      Pertinent Vitals/Pain Pain Assessment Pain Assessment: No/denies pain    Home Living Family/patient expects  to be discharged to:: Private residence Living Arrangements: Children Available Help at Discharge: Family;Available 24 hours/day Type of Home: House           Home Equipment: None          PT Goals (current goals can now be found in the care plan section) Acute Rehab PT Goals Patient Stated Goal: go home today PT Goal Formulation: With patient Time For Goal Achievement: 07-11-2022 Potential to Achieve Goals: Good Progress towards PT goals: Progressing toward goals    Frequency    Min 3X/week      PT Plan Current plan remains appropriate       AM-PAC PT "6  Clicks" Mobility   Outcome Measure  Help needed turning from your back to your side while in a flat bed without using bedrails?: None Help needed moving from lying on your back to sitting on the side of a flat bed without using bedrails?: None Help needed moving to and from a bed to a chair (including a wheelchair)?: None Help needed standing up from a chair using your arms (e.g., wheelchair or bedside chair)?: None Help needed to walk in hospital room?: A Little Help needed climbing 3-5 steps with a railing? : A Little 6 Click Score: 22    End of Session Equipment Utilized During Treatment: Gait belt;Oxygen Activity Tolerance: Treatment limited secondary to medical complications (Comment) (rapid desaturation to 70s with ambulation) Patient left: in bed;with call bell/phone within reach;with bed alarm set;with family/visitor present Nurse Communication: Mobility status (O2) PT Visit Diagnosis: Other abnormalities of gait and mobility (R26.89)     Time: 1962-2297 PT Time Calculation (min) (ACUTE ONLY): 36 min  Charges:  $Therapeutic Exercise: 23-37 mins                     West Carbo, PT, DPT   Acute Rehabilitation Department   Sandra Cockayne 06/25/2022, 2:32 PM

## 2022-06-26 ENCOUNTER — Inpatient Hospital Stay (HOSPITAL_COMMUNITY): Payer: Medicare Other

## 2022-06-26 DIAGNOSIS — Z978 Presence of other specified devices: Secondary | ICD-10-CM | POA: Diagnosis not present

## 2022-06-26 DIAGNOSIS — N1832 Chronic kidney disease, stage 3b: Secondary | ICD-10-CM | POA: Diagnosis not present

## 2022-06-26 DIAGNOSIS — I10 Essential (primary) hypertension: Secondary | ICD-10-CM | POA: Diagnosis not present

## 2022-06-26 DIAGNOSIS — J9601 Acute respiratory failure with hypoxia: Secondary | ICD-10-CM | POA: Diagnosis not present

## 2022-06-26 LAB — BASIC METABOLIC PANEL
Anion gap: 8 (ref 5–15)
BUN: 24 mg/dL — ABNORMAL HIGH (ref 8–23)
CO2: 24 mmol/L (ref 22–32)
Calcium: 8.2 mg/dL — ABNORMAL LOW (ref 8.9–10.3)
Chloride: 105 mmol/L (ref 98–111)
Creatinine, Ser: 1.5 mg/dL — ABNORMAL HIGH (ref 0.61–1.24)
GFR, Estimated: 45 mL/min — ABNORMAL LOW (ref >60)
Glucose, Bld: 173 mg/dL — ABNORMAL HIGH (ref 70–99)
Potassium: 3.6 mmol/L (ref 3.5–5.1)
Sodium: 137 mmol/L (ref 135–145)

## 2022-06-26 LAB — PROCALCITONIN: Procalcitonin: 0.15 ng/mL

## 2022-06-26 MED ORDER — METHYLPREDNISOLONE SODIUM SUCC 125 MG IJ SOLR
125.0000 mg | Freq: Two times a day (BID) | INTRAMUSCULAR | Status: DC
  Administered 2022-06-26 – 2022-07-03 (×15): 125 mg via INTRAVENOUS
  Filled 2022-06-26 (×15): qty 2

## 2022-06-26 MED ORDER — POTASSIUM CHLORIDE CRYS ER 20 MEQ PO TBCR
40.0000 meq | EXTENDED_RELEASE_TABLET | Freq: Two times a day (BID) | ORAL | Status: AC
  Administered 2022-06-26 (×2): 40 meq via ORAL
  Filled 2022-06-26 (×2): qty 2

## 2022-06-26 MED ORDER — FUROSEMIDE 10 MG/ML IJ SOLN
40.0000 mg | Freq: Four times a day (QID) | INTRAMUSCULAR | Status: AC
  Administered 2022-06-26 (×2): 40 mg via INTRAVENOUS
  Filled 2022-06-26 (×2): qty 4

## 2022-06-26 NOTE — Progress Notes (Signed)
\  Mobility Specialist Progress Note   06/26/22 1526  Mobility  Activity Contraindicated/medical hold   Pt not appropriate at this time given level of complexity, physical assist, and/or precautions. Currently requiring 25L to maintain an SpO2 >90% . Will hold today and continue to follow.  [place] Reannon Candella, [place], [place]  Office: [phone]

## 2022-06-26 NOTE — Evaluation (Signed)
Clinical/Bedside Swallow Evaluation Patient Details  Name: Ernest Parker MRN: [id number] Date of Birth: [birth year]  Today's Date: 06/26/2022 Time: SLP Start Time (ACUTE ONLY): 1150 SLP Stop Time (ACUTE ONLY): 1159 SLP Time Calculation (min) (ACUTE ONLY): 9 min  Past Medical History:  Past Medical History:  Diagnosis Date   A-fib ([place])    Barrett's esophagus    Dizzy resolved   Dyspnea    with exertion occ   GERD (gastroesophageal reflux disease)    Hard of hearing    wears hearing aids bilat   High cholesterol    Hypertension    Presence of permanent cardiac pacemaker 12/17/2017   Prostate cancer ([place]) 2002   S/P "radiation for 8-9 weeks"   SOB (shortness of breath)    Past Surgical History:  Past Surgical History:  Procedure Laterality Date   ABDOMINAL AORTOGRAM W/LOWER EXTREMITY Bilateral 05/17/2021   Procedure: ABDOMINAL AORTOGRAM W/LOWER EXTREMITY;  Surgeon: Serafina Mitchell, MD;  Location: [place] [facility];  Service: Cardiovascular;  Laterality: Bilateral;   BALLOON DILATION N/A 09/05/2018   Procedure: BALLOON DILATION;  Surgeon: Milus Banister, MD;  Location: [facility];  Service: Endoscopy;  Laterality: N/A;   BILIARY STENT PLACEMENT  07/09/2018   Procedure: BILIARY STENT PLACEMENT;  Surgeon: Milus Banister, MD;  Location[facility];  Service: Endoscopy;;   BILIARY STENT PLACEMENT N/A 09/05/2018   Procedure: BILIARY STENT PLACEMENT;  Surgeon: Milus Banister, MD;  Location: [facility];  Service: Endoscopy;  Laterality: N/A;   BIOPSY  07/05/2018   Procedure: BIOPSY;  Surgeon: Rush Landmark Telford Nab., MD;  Location: [facility] ENDOSCOPY;  Service: Gastroenterology;;   CHOLECYSTECTOMY     2020 late april or early may   ENDOSCOPIC RETROGRADE CHOLANGIOPANCREATOGRAPHY (ERCP) WITH PROPOFOL N/A 09/05/2018   Procedure: ENDOSCOPIC RETROGRADE CHOLANGIOPANCREATOGRAPHY (ERCP) WITH PROPOFOL;  Surgeon: Milus Banister, MD;  Location: [facility];  Service: Endoscopy;   Laterality: N/A;   ERCP N/A 07/09/2018   Procedure: ENDOSCOPIC RETROGRADE CHOLANGIOPANCREATOGRAPHY (ERCP);  Surgeon: Milus Banister, MD;  Location: [facility] ENDOSCOPY;  Service: Endoscopy;  Laterality: N/A;   ESOPHAGOGASTRODUODENOSCOPY (EGD) WITH PROPOFOL N/A 07/05/2018   Procedure: ESOPHAGOGASTRODUODENOSCOPY (EGD) WITH PROPOFOL;  Surgeon: Rush Landmark Telford Nab., MD;  Location: [place];  Service: Gastroenterology;  Laterality: N/A;   EUS  07/05/2018   Procedure: FULL UPPER ENDOSCOPIC ULTRASOUND (EUS) RADIAL;  Surgeon: Rush Landmark Telford Nab., MD;  Location: [place];  Service: Gastroenterology;;   INGUINAL HERNIA REPAIR Bilateral    INSERT / REPLACE / [place]  12/17/2017   IR CHOLANGIOGRAM EXISTING TUBE  09/10/2018   IR EXCHANGE BILIARY DRAIN  08/14/2018   IR EXCHANGE BILIARY DRAIN  10/08/2018   IR PERC CHOLECYSTOSTOMY  07/10/2018   PACEMAKER IMPLANT N/A 12/17/2017   Procedure: PACEMAKER IMPLANT;  Surgeon: Evans Lance, MD;  Location: [place] [facility];  Service: Cardiovascular;  Laterality: N/A;   PERIPHERAL VASCULAR INTERVENTION Left 05/17/2021   Procedure: PERIPHERAL VASCULAR INTERVENTION;  Surgeon: Serafina Mitchell, MD;  Location: [place] [facility];  Service: Cardiovascular;  Laterality: Left;  superficial femoral   PROSTATE BIOPSY     REMOVAL OF STONES  07/09/2018   Procedure: REMOVAL OF STONES;  Surgeon: Milus Banister, MD;  Location: [facility] ENDOSCOPY;  Service: Endoscopy;;   SPHINCTEROTOMY  07/09/2018   Procedure: [name];  Surgeon: Milus Banister, MD;  Location[facility] ENDOSCOPY;  Service: Endoscopy;;   SPHINCTEROTOMY  09/05/2018   Procedure: [name];  Surgeon: Milus Banister, MD;  Location: [facility];  Service: Endoscopy;;   STENT REMOVAL  09/05/2018   Procedure: STENT REMOVAL;  Surgeon: Milus Banister, MD;  Location: [facility];  Service: Endoscopy;;   STONE EXTRACTION WITH BASKET  09/05/2018   Procedure: STONE EXTRACTION WITH BASKET;  Surgeon:  Milus Banister, MD;  Location: [facility];  Service: Endoscopy;;   THORACIC AORTIC ANEURYSM REPAIR  03/2018   TRANSURETHRAL RESECTION OF PROSTATE N/A 03/10/2019   Procedure: TRANSURETHRAL RESECTION OF THE PROSTATE (TURP);  Surgeon: Lucas Mallow, MD;  Location: [facility];  Service: Urology;  Laterality: N/A;   HPI:  Pt is an 88 y.o. male  who presented to ED with complaints of shortness of breath. He was found to have multifocal airspace disease suggestive of PNA and positive for rhinovirus.  PMH significant of atrial fibrillation, CKD stage 3, GERD, HLD, HTN, sinus node dysfunction s/p PPM, prostate cancer s/p radiation, T2DM, chronic indwelling foley    Assessment / Plan / Recommendation  Clinical Impression   Pt presents with functional oropharyngeal swallow at bedside. Oral mechanism assessment was unremarkable. Pt with upper and lower dentures, which were donned during the assessment. Pt reports no recent issues or increased coughing with swallowing. Volitional cough presents as congested. Pt observed thin liquids, puree and regular consistency trials. Pt observed with multiple consecutive sips (2-3) of thin liquids without s/sx aspiration, however, one instance of delayed cough noted with thin liquids when taken immediately after regular consistency. Mastication of regular consistency presents as functional. Pt educated on aspiration precautions. Recommend pt continue thin liquid/regular consistency diet, pills whole with liquids. No SLP treatment required at this time.  SLP Visit Diagnosis: Dysphagia, unspecified (R13.10)    Aspiration Risk  Mild aspiration risk    Diet Recommendation Thin liquid;Regular   Liquid Administration via: Straw;Cup Medication Administration: Whole meds with liquid Supervision: Patient able to self feed Compensations: Small sips/bites;Slow rate Postural Changes: Seated upright at 90 degrees    Other  Recommendations Oral Care Recommendations: Oral care  BID    Recommendations for follow up therapy are one component of a multi-disciplinary discharge planning process, led by the attending physician.  Recommendations may be updated based on patient status, additional functional criteria and insurance authorization.  Follow up Recommendations No SLP follow up      Assistance Recommended at Discharge None  Functional Status Assessment Patient has not had a recent decline in their functional status  Frequency and Duration            Prognosis        Swallow Study   General Date of Onset: 06/21/22 HPI: Pt is an 88 y.o. male  who presented to ED with complaints of shortness of breath. He was found to have multifocal airspace disease suggestive of PNA and positive for rhinovirus.  PMH significant of atrial fibrillation, CKD stage 3, GERD, HLD, HTN, sinus node dysfunction s/p PPM, prostate cancer s/p radiation, T2DM, chronic indwelling foley Type of Study: Bedside Swallow Evaluation Diet Prior to this Study: Thin liquids;Regular Temperature Spikes Noted: No Respiratory Status: Nasal cannula History of Recent Intubation: No Behavior/Cognition: Alert;Cooperative;Pleasant mood Oral Cavity Assessment: Within Functional Limits Oral Care Completed by SLP: No Oral Cavity - Dentition: Dentures, top;Dentures, bottom Vision: Functional for self-feeding Self-Feeding Abilities: Able to feed self Patient Positioning: Upright in bed Baseline Vocal Quality: Normal Volitional Cough: Strong;Congested Volitional Swallow: Able to elicit    Oral/Motor/Sensory Function Overall Oral Motor/Sensory Function: Within functional limits   Ice Chips Ice chips: Not tested  Thin Liquid Thin Liquid: Impaired Presentation: Self Fed;Straw Pharyngeal  Phase Impairments: Cough - Delayed    Nectar Thick Nectar Thick Liquid: Not tested   Honey Thick Honey Thick Liquid: Not tested   Puree Puree: Within functional limits Presentation: Self Fed;Spoon   Solid      Solid: Within functional limits Presentation: [place] Student SLP  06/26/2022,1:53 PM

## 2022-06-26 NOTE — Progress Notes (Signed)
NAME:  Ernest Parker, MRN:  [id number], DOB:  1934/03/13, LOS: 5 ADMISSION DATE:  06/21/2022, CONSULTATION DATE:  06/25/22 REFERRING MD:  Bonner Puna , CHIEF COMPLAINT:  SOB   History of Present Illness:  88 yo M PMH Afib on amio, CKD III, HTN, SSS s/p PPM, remote tobacco use admitted to [facility] 10/11 for SOB and weakness after first presenting to PCP where he was hypoxic with SpO2 70 on RA. He c/o a dry cough which did not improve with tessalon pearls, and progressive weakness. Did have some URI sx. No sick contacts.He was found to have multifocal airspace disease and positive for rhinovirus. He was hypoxic and required 4LNC.  Azithromycin and Rocephin were added 10/12 for possible superimposed bacterial infection. IV lasix was added 10/13.   O2 needs increased a bit in the coming days. On 10/15, a CTA chest was acquired to r/o PE. There was no evidence of PE but did reveal extensive GGOs and bilateral infiltrates.   PCCM is consulted in this setting.   Normally can do about 15-16 golf holes with cart before getting fatigued. 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  Pertinent  Medical History  Afib Prior tobacco use (30 pack yr hx, quit 40 years ago)  Barrett's esophagus GERD HLD HTN Prostate cancer s/p XRT SSS s/p PPM CKD III PAD Obesity   Significant Hospital Events: Including procedures, antibiotic start and stop dates in addition to other pertinent events   10/11 hypoxic at PCP to 94s. Presented to ED. Admit to [facility] for rhinovirus PNA, hypoxia 10/12 azithro, rocephin added 10/13 Started IV diuresis  10/15 CTA chest no PE, extensive GGOs and bilateral infiltrate. Pulm consult   Interim History / Subjective:  Feels fine but O2 needs increased markedly.  Objective   Blood pressure (!) 150/72, pulse  78, temperature 98 F (36.7 C), temperature source Oral, resp. rate 18, height '5\' 9"'$  (1.753 m), weight 99.1 kg, SpO2 94 %.    FiO2 (%):  [100 %] 100 %   Intake/Output Summary (Last 24 hours) at 06/26/2022 1206 Last data filed at 06/26/2022 0544 Gross per 24 hour  Intake 300 ml  Output 1575 ml  Net -1275 ml    Filed Weights   06/24/22 0304  Weight: 99.1 kg    Examination: Well appearing man in no distress Crackles at bases persistent, worse on left Ext warm Aox3, good insight Abd soft  New CXR: infiltrates on left look a little worse to me  Resolved Hospital Problem list     Assessment & Plan:   Acute hypoxic respiratory failure Rhinovirus PNA Question superimposed COP Baseline abnormal CXR c/w at least beginnings of IPF At risk for amio lung toxicity but timeline not consistent  Given worsening O2 needs, increase lasix, increase steroids, check echo/bubble, check Pct, now on HHFNC, stop further amio.  Too much O2 for safe bronch at present, really does not appear infected nor do I think would change management.  Low threshold for ICU transfer if any resp distress, right now appears great even if sats are < 85%.    Plan discussed with primary, RT, and RN   Best Practice (right click and "Reselect all SmartList Selections" daily)  Per primary  Erskine Emery MD [facility] [place] for pager  06/26/2022, 12:06 PM

## 2022-06-26 NOTE — Progress Notes (Signed)
Progress Note  Patient: Ernest Parker RJJ:[id number] DOB: 04-05-1934  DOA: 06/21/2022  DOS: 06/26/2022    Brief hospital course: MACLANE HOLLORAN is an 88 y.o. male with a history of AFib, stage III CKD, HTN, HLD, SSS s/p PPM, prostate CA s/p XRT, T2DM, chronic indwelling foley, remote tobacco use, and GERD who presented to the ED with increasing shortness of breath in the setting of rhinorrhea, congestion and cough associated with diffuse weakness. He was found to be hypoxemic with multifocal pneumonia detailed on CXR. Subsequent virus panel positive for rhinovirus. Supportive care with antibiotics for possibility of superimposed CAP was administered. Diuretic IV given, then restarted home PO lasix. The patient's hypoxemia continued. CTA chest showed no PE, but there were extensive infiltrates. PCCM consulted, added steroids. On 10/16, hypoxemia has worsened. Pt is full code.   Assessment and Plan: Acute hypoxic respiratory failure due to rhinovirus multifocal pneumonia: Worsening hypoxemia today with increased left-sided crackles by exam. This correlates to increased density of opacities on CXR.  - CTA chest did not show PE but there are extensive interstitial and alveolar infiltrates bilaterally, some LNs. This would be one of the more severe non-covid viral pneumonias I've seen. Is there a component of ILD (symptoms relatively abrupt for that but he's currently hypoxemic out of proportion to his dyspnea, so ?more chronic).  - I appreciate PCCM consultation, d/w Dr. Tamala Julian, will throw more at him given escalation to NRB. Increase steroids, empiric IV diuresis, DC amio.  - Monitor closely in PCU, low threshold for ICU transfer.  - Continue IS - We've continued empiric antibiotics, though suspicion for bacterial pneumonia is low. PCT repeat is pending.  Chronic HFpEF: LVEF 60-65%, G1DD, mod LAE - Held lasix for AKI, Cr improved, elevated LVEDP, so restarted home lasix '40mg'$  po BID, augment today. -  Follow up aortic dilatation by echo.   Hypokalemia: Resolved with supplementation.  - Monitor.   Leukocytosis: Developed after steroids.  AKI on stage IIIb CKD: Based on available values. - Monitor in AM after CTA chest.  PAF, sinus node dysfunction s/p PPM: In paced/NSR. - Continue amiodarone, cardizem and eliquis.   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 He is s/p left SFA stent placement 05/17/21 by Dr. Trula Slade - Continue ASA and crestor   NIDT2DM: HbA1c 5.7%.  - SSI   HLD:  - Continue statin  Hx prostate CA s/p XRT, chronic indwelling foley catheter - Foley catheter care   Elevated d-dimer: Negative LE venous U/S and CTA chest PE study.   Obesity: Estimated body mass index is 32.26 kg/m as calculated from the following:   Height as of this encounter: '5\' 9"'$  (1.753 m).   Weight as of this encounter: 99.1 kg.  Subjective: Cough a bit less, notes no symptomatic worsening. Did not think nebs helped at all. No new complaints. Later, after my encounter, patient developed increasing hypoxemia, still not very symptomatic.   Objective: Vitals:   06/26/22 0930 06/26/22 1016 06/26/22 1017 06/26/22 1207  BP:    (!) 160/69  Pulse:  78  65  Resp:  18  18  Temp:    97.9 F (36.6 C)  TempSrc:    Oral  SpO2: 93% 94% 94% 94%  Weight:      Height:  Gen: Elderly male in no distress Pulm: Nonlabored, normal rate even, but crackles diffusely and L > R. No wheezes. CV: Regular rate and rhythm. No murmur, rub, or gallop. No JVD, no dependent edema. GI: Abdomen soft, non-tender, non-distended, with normoactive bowel sounds.  Ext: Warm, no deformities Skin: No rashes, lesions or ulcers on visualized skin. Neuro: Alert and oriented. No focal neurological deficits. Psych: Judgement and  insight appear fair. Mood euthymic & affect congruent. Behavior is appropriate.    Data Personally reviewed: CBC: Recent Labs  Lab 06/21/22 1526 06/22/22 0430 06/23/22 0500 06/25/22 0514  WBC 10.1 6.3 13.6* 10.7*  NEUTROABS 7.9*  --  11.7*  --   HGB 12.8* 11.7* 11.5* 11.6*  HCT 38.7* 34.1* 34.6* 35.0*  MCV 93.0 91.2 93.0 92.8  PLT 231 219 265 202   Basic Metabolic Panel: Recent Labs  Lab 06/21/22 1526 06/21/22 2036 06/22/22 1606 06/23/22 0500 06/25/22 0514 06/26/22 0445  NA 136  --  138 140 137 137  K 3.2*  --  4.2 3.9 3.8 3.6  CL 100  --  103 108 106 105  CO2 27  --  '26 25 23 24  '$ GLUCOSE 117*  --  105* 124* 136* 173*  BUN 21  --  28* 27* 21 24*  CREATININE 1.98*  --  1.82* 1.58* 1.64* 1.50*  CALCIUM 8.7*  --  9.1 8.5* 8.0* 8.2*  MG  --  2.2  --  2.4  --   --    GFR: Estimated Creatinine Clearance: 41.1 mL/min (A) (by C-G formula based on SCr of 1.5 mg/dL (H)). Liver Function Tests: Recent Labs  Lab 06/21/22 2036  AST 21  ALT 16  ALKPHOS 72  BILITOT 0.8  PROT 7.0  ALBUMIN 2.9*   No results for input(s): "LIPASE", "AMYLASE" in the last 168 hours. No results for input(s): "AMMONIA" in the last 168 hours. Coagulation Profile: No results for input(s): "INR", "PROTIME" in the last 168 hours. Cardiac Enzymes: No results for input(s): "CKTOTAL", "CKMB", "CKMBINDEX", "TROPONINI" in the last 168 hours. BNP (last 3 results) No results for input(s): "PROBNP" in the last 8760 hours. HbA1C: No results for input(s): "HGBA1C" in the last 72 hours. CBG: No results for input(s): "GLUCAP" in the last 168 hours. Lipid Profile: No results for input(s): "CHOL", "HDL", "LDLCALC", "TRIG", "CHOLHDL", "LDLDIRECT" in the last 72 hours. Thyroid Function Tests: No results for input(s): "TSH", "T4TOTAL", "FREET4", "T3FREE", "THYROIDAB" in the last 72 hours. Anemia Panel: No results for input(s): "VITAMINB12", "FOLATE", "FERRITIN", "TIBC", "IRON", "RETICCTPCT" in the last 72  hours. Urine analysis:    Component Value Date/Time   COLORURINE YELLOW 05/18/2021 1114   APPEARANCEUR TURBID (A) 05/18/2021 1114   LABSPEC 1.010 05/18/2021 1114   PHURINE 8.5 (H) 05/18/2021 1114   GLUCOSEU NEGATIVE 05/18/2021 1114   HGBUR LARGE (A) 05/18/2021 1114   BILIRUBINUR NEGATIVE 05/18/2021 1114   KETONESUR NEGATIVE 05/18/2021 1114   PROTEINUR 100 (A) 05/18/2021 1114   NITRITE NEGATIVE 05/18/2021 1114   LEUKOCYTESUR MODERATE (A) 05/18/2021 1114   Recent Results (from the past 240 hour(s))  Resp Panel by RT-PCR (Flu A&B, Covid) Anterior Nasal Swab     Status: None   Collection Time: 06/21/22 12:24 PM   Specimen: Anterior Nasal Swab  Result Value Ref Range Status   SARS Coronavirus 2 by RT PCR NEGATIVE NEGATIVE Final    Comment: (NOTE) SARS-CoV-2 target nucleic acids are NOT DETECTED.  The SARS-CoV-2 RNA is generally detectable in upper respiratory specimens  during the acute phase of infection. The lowest concentration of SARS-CoV-2 viral copies this assay can detect is 138 copies/mL. A negative result does not preclude SARS-Cov-2 infection and should not be used as the sole basis for treatment or other patient management decisions. A negative result may occur with  improper specimen collection/handling, submission of specimen other than nasopharyngeal swab, presence of viral mutation(s) within the areas targeted by this assay, and inadequate number of viral copies(<138 copies/mL). A negative result must be combined with clinical observations, patient history, and epidemiological information. The expected result is Negative.  Fact Sheet for Patients:  EntrepreneurPulse.com.au  Fact Sheet for Healthcare Providers:  IncredibleEmployment.be  This test is no t yet approved or cleared by the [place] FDA and  has been authorized for detection and/or diagnosis of SARS-CoV-2 by FDA under an Emergency Use Authorization (EUA). This  EUA will remain  in effect (meaning this test can be used) for the duration of the COVID-19 declaration under Section 564(b)(1) of the Act, 21 U.S.C.section 360bbb-3(b)(1), unless the authorization is terminated  or revoked sooner.       Influenza A by PCR NEGATIVE NEGATIVE Final   Influenza B by PCR NEGATIVE NEGATIVE Final    Comment: (NOTE) The Xpert Xpress SARS-CoV-2/FLU/RSV plus assay is intended as an aid in the diagnosis of influenza from Nasopharyngeal swab specimens and should not be used as a sole basis for treatment. Nasal washings and aspirates are unacceptable for Xpert Xpress SARS-CoV-2/FLU/RSV testing.  Fact Sheet for Patients: EntrepreneurPulse.com.au  Fact Sheet for Healthcare Providers: IncredibleEmployment.be  This test is not yet approved or cleared by the [place] FDA and has been authorized for detection and/or diagnosis of SARS-CoV-2 by FDA under an Emergency Use Authorization (EUA). This EUA will remain in effect (meaning this test can be used) for the duration of the COVID-19 declaration under Section 564(b)(1) of the Act, 21 U.S.C. section 360bbb-3(b)(1), unless the authorization is terminated or revoked.  Performed at [facility], [place] [address], [place], [place] [address]   Respiratory (~20 pathogens) panel by PCR     Status: Abnormal   Collection Time: 06/21/22  7:34 PM   Specimen: Nasopharyngeal Swab; Respiratory  Result Value Ref Range Status   Adenovirus NOT DETECTED NOT DETECTED Final   Coronavirus 229E NOT DETECTED NOT DETECTED Final    Comment: (NOTE) The Coronavirus on the Respiratory Panel, DOES NOT test for the novel  Coronavirus (2019 nCoV)    Coronavirus HKU1 NOT DETECTED NOT DETECTED Final   Coronavirus NL63 NOT DETECTED NOT DETECTED Final   Coronavirus OC43 NOT DETECTED NOT DETECTED Final   Metapneumovirus NOT DETECTED NOT DETECTED Final   Rhinovirus / Enterovirus DETECTED (A) NOT  DETECTED Final   Influenza A NOT DETECTED NOT DETECTED Final   Influenza B NOT DETECTED NOT DETECTED Final   Parainfluenza Virus 1 NOT DETECTED NOT DETECTED Final   Parainfluenza Virus 2 NOT DETECTED NOT DETECTED Final   Parainfluenza Virus 3 NOT DETECTED NOT DETECTED Final   Parainfluenza Virus 4 NOT DETECTED NOT DETECTED Final   Respiratory Syncytial Virus NOT DETECTED NOT DETECTED Final   Bordetella pertussis NOT DETECTED NOT DETECTED Final   Bordetella Parapertussis NOT DETECTED NOT DETECTED Final   Chlamydophila pneumoniae NOT DETECTED NOT DETECTED Final   Mycoplasma pneumoniae NOT DETECTED NOT DETECTED Final    Comment: Performed at [facility], [place]. [address], [place], [place] [address]     DG Chest [place] [address]  Result Date: 06/26/2022 CLINICAL DATA:  [address] ARDS (adult respiratory distress syndrome) ([place]) [phone] EXAM: PORTABLE CHEST 1 VIEW COMPARISON:  Radiograph 06/22/2022, CT 06/25/2022 FINDINGS: With the unchanged large cardiac silhouette with dual chamber pacemaker leads. Persistent multifocal airspace disease bilaterally, similar to recent chest CT, increased in the left upper lobe in comparison prior radiograph on 10/12. No large effusion. No evidence of pneumothorax. No acute osseous abnormality. Thoracic spondylosis. IMPRESSION: Persistent multifocal airspace disease bilaterally, compatible with multifocal pneumonia and/or edema. Electronically Signed   By: Maurine Simmering M.D.   On: 06/26/2022 10:06   VAS [place] LOWER EXTREMITY VENOUS (DVT)  Result Date: 06/25/2022  Lower Venous DVT Study Patient Name:  ROBERT SPERL  Date of Exam:   06/25/2022 Medical Rec #: [id number]      Accession #:    [id number] Date of Birth: Jan 28, 1934      Patient Gender: M Patient Age:   49 years Exam Location:  [facility] Procedure:      VAS [place] LOWER EXTREMITY VENOUS (DVT) Referring Phys: Vance Gather --------------------------------------------------------------------------------  Indications:  Edema, SOB, and Elevated D-Dimer, pneumonia, rhinovirus, atrial fibrillation.  Comparison Study: No prior study on file Performing Technologist: Sharion Dove RVS  Examination Guidelines: A complete evaluation includes B-mode imaging, spectral Doppler, color Doppler, and power Doppler as needed of all accessible portions of each vessel. Bilateral testing is considered an integral part of a complete examination. Limited examinations for reoccurring indications may be performed as noted. The reflux portion of the exam is performed with the patient in reverse Trendelenburg.  +---------+---------------+---------+-----------+----------+--------------+ RIGHT    CompressibilityPhasicitySpontaneityPropertiesThrombus Aging +---------+---------------+---------+-----------+----------+--------------+ CFV      Full           Yes      Yes                                 +---------+---------------+---------+-----------+----------+--------------+ SFJ      Full                                                        +---------+---------------+---------+-----------+----------+--------------+ FV Prox  Full                                                        +---------+---------------+---------+-----------+----------+--------------+ FV Mid   Full                                                        +---------+---------------+---------+-----------+----------+--------------+ FV DistalFull                                                        +---------+---------------+---------+-----------+----------+--------------+ PFV      Full                                                        +---------+---------------+---------+-----------+----------+--------------+  POP      Full           Yes      Yes                                 +---------+---------------+---------+-----------+----------+--------------+ PTV      Full                                                         +---------+---------------+---------+-----------+----------+--------------+ PERO     Full                                                        +---------+---------------+---------+-----------+----------+--------------+   +---------+---------------+---------+-----------+----------+--------------+ LEFT     CompressibilityPhasicitySpontaneityPropertiesThrombus Aging +---------+---------------+---------+-----------+----------+--------------+ CFV      Full           Yes      Yes                                 +---------+---------------+---------+-----------+----------+--------------+ SFJ      Full                                                        +---------+---------------+---------+-----------+----------+--------------+ FV Prox  Full                                                        +---------+---------------+---------+-----------+----------+--------------+ FV Mid   Full                                                        +---------+---------------+---------+-----------+----------+--------------+ FV DistalFull                                                        +---------+---------------+---------+-----------+----------+--------------+ PFV      Full                                                        +---------+---------------+---------+-----------+----------+--------------+ POP      Full           Yes      Yes                                 +---------+---------------+---------+-----------+----------+--------------+  PTV      Full                                                        +---------+---------------+---------+-----------+----------+--------------+ PERO     Full                                                        +---------+---------------+---------+-----------+----------+--------------+     Summary: BILATERAL: - No evidence of deep vein thrombosis seen in the lower extremities, bilaterally. -No evidence of  popliteal cyst, bilaterally.   *See table(s) above for measurements and observations. Electronically signed by Monica Martinez MD on 06/25/2022 at 11:51:38 AM.    Final    CT Angio Chest Pulmonary Embolism (PE) W or WO Contrast  Result Date: 06/25/2022 CLINICAL DATA:  Positive D-dimer, left shoulder pain EXAM: CT ANGIOGRAPHY CHEST WITH CONTRAST TECHNIQUE: Multidetector CT imaging of the chest was performed using the standard protocol during bolus administration of intravenous contrast. Multiplanar CT image reconstructions and MIPs were obtained to evaluate the vascular anatomy. RADIATION DOSE REDUCTION: This exam was performed according to the departmental dose-optimization program which includes automated exposure control, adjustment of the mA and/or kV according to patient size and/or use of iterative reconstruction technique. CONTRAST:  83m OMNIPAQUE IOHEXOL 350 MG/ML SOLN COMPARISON:  Previous studies including the chest radiographs done on 06/22/2022 FINDINGS: Cardiovascular: Significant atherosclerotic changes are noted in thoracic aorta with calcifications and plaques. There are no intraluminal filling defects in central pulmonary artery branches. Evaluation of small peripheral branches is limited by motion artifacts and infiltrates. Coronary artery calcifications are seen. Pacemaker battery is seen in left infraclavicular region. Mediastinum/Nodes: There are enlarged lymph nodes in mediastinum measuring up to 1.5 cm in maximum diameter. Subcentimeter nodes are seen in both hilar regions. Lungs/Pleura: Extensive ground-glass and alveolar infiltrates in both lungs. Findings are more prominent in the upper and mid lung fields. Minimal right pleural effusion is seen. There is no pneumothorax. Upper Abdomen: Air is noted in the bile ducts suggesting previous sphincterotomy. Surgical clips are seen in gallbladder fossa. There is 1.6 cm fluid density lesion in the liver suggesting possible hepatic cyst.  Small hiatal hernia is seen. Musculoskeletal: No acute findings are seen. Review of the MIP images confirms the above findings. IMPRESSION: There is no evidence of central pulmonary artery embolism. Evaluation of small peripheral branches is limited by motion artifacts and infiltrates. Arteriosclerotic changes are noted in thoracic aorta. Coronary artery calcifications are seen. Extensive patchy infiltrates are seen in both lungs suggesting multifocal pneumonia. Possibility of underlying interstitial edema and underlying scarring is not excluded. There is minimal right pleural effusion. Small hiatal hernia. Pneumobilia suggests possible previous sphincterotomy. Electronically Signed   By: PElmer PickerM.D.   On: 06/25/2022 10:48    Family Communication:  Son at bedside  Disposition: Status is: Inpatient Remains inpatient appropriate because: Worsening hypoxic respiratory failure  Planned Discharge Destination: TBD  RPatrecia Pour MD 06/26/2022 1:13 PM Page by aShea [email or web address]

## 2022-06-27 ENCOUNTER — Inpatient Hospital Stay (HOSPITAL_COMMUNITY): Payer: Medicare Other

## 2022-06-27 DIAGNOSIS — J9601 Acute respiratory failure with hypoxia: Secondary | ICD-10-CM

## 2022-06-27 DIAGNOSIS — Z978 Presence of other specified devices: Secondary | ICD-10-CM | POA: Diagnosis not present

## 2022-06-27 DIAGNOSIS — I10 Essential (primary) hypertension: Secondary | ICD-10-CM | POA: Diagnosis not present

## 2022-06-27 DIAGNOSIS — N1832 Chronic kidney disease, stage 3b: Secondary | ICD-10-CM | POA: Diagnosis not present

## 2022-06-27 LAB — ECHOCARDIOGRAM LIMITED BUBBLE STUDY: S' Lateral: 3.8 cm

## 2022-06-27 MED ORDER — FUROSEMIDE 10 MG/ML IJ SOLN
40.0000 mg | Freq: Two times a day (BID) | INTRAMUSCULAR | Status: AC
  Administered 2022-06-27 (×2): 40 mg via INTRAVENOUS
  Filled 2022-06-27 (×2): qty 4

## 2022-06-27 NOTE — Progress Notes (Signed)
Physical Therapy Treatment Patient Details Name: Ernest Parker MRN: [id number] DOB: 1934-08-17 Today's Date: 06/27/2022   History of Present Illness Pt is 88 y.o. male  who presented to ED 06/21/22 with complaints of shortness of breath. +pna and rhinovirus.  Requiring heated high flow O2;  PMH significant of atrial fibrillation, CKD stage 3, GERD, HLD, HTN, sinus node dysfunction s/p PPM, prostate cancer s/p radiation, T2DM, chronic indwelling foley    PT Comments    Pt moves well with good balance, but rapid desaturation in O2 even on HHFNC.  Pt marched in place 30 sec x 2 with extended rest breaks.  He physically and verbalized tolerating well with minimal shortness of breath (1/4) but sats dropping to 78% and requiring increased time to recover.   Further activity limited due to resp status.  Continue plan of care.    Recommendations for follow up therapy are one component of a multi-disciplinary discharge planning process, led by the attending physician.  Recommendations may be updated based on patient status, additional functional criteria and insurance authorization.  Follow Up Recommendations  No PT follow up     Assistance Recommended at Discharge Intermittent Supervision/Assistance  Patient can return home with the following Assistance with cooking/housework;Help with stairs or ramp for entrance   Equipment Recommendations  None recommended by PT    Recommendations for Other Services       Precautions / Restrictions Precautions Precautions: Other (comment) Precaution Comments: desaturates quickly     Mobility  Bed Mobility Overal bed mobility: Independent                  Transfers Overall transfer level: Needs assistance   Transfers: Sit to/from Stand Sit to Stand: Supervision           General transfer comment: supervision for monitoring and lines    Ambulation/Gait Ambulation/Gait assistance: Supervision   Assistive device: None          General Gait Details: Marched in place x 2 for 30 seconds (see general comments); limited due to decreased sats and HHFNC   Stairs             Wheelchair Mobility    Modified Rankin (Stroke Patients Only)       Balance Overall balance assessment: Needs assistance   Sitting balance-Leahy Scale: Normal     Standing balance support: No upper extremity supported Standing balance-Leahy Scale: Good                              Cognition Arousal/Alertness: Awake/alert Behavior During Therapy: WFL for tasks assessed/performed Overall Cognitive Status: Within Functional Limits for tasks assessed                                          Exercises Other Exercises Other Exercises: Flutter x 5 and IS to 750 mL x 5    General Comments General comments (skin integrity, edema, etc.): Pt on 25L 80% FiO2 HHFNC at rest with sats 90%.  Marched in place 30 seconds with minimal shortness of breath but sats down to 78% and requiring 5 mins and 35 L 80% FiO2 to recover.  Performed 30 more seconds on 35L 80% FiO2 with sats 81% and 4 mins to recover.  Able to decrease back to 25L 80% FiO2 and sats 89-90% with pt in  bed.      Pertinent Vitals/Pain Pain Assessment Pain Assessment: No/denies pain    Home Living                          Prior Function            PT Goals (current goals can now be found in the care plan section) Progress towards PT goals: Not progressing toward goals - comment (limited by resp status)    Frequency    Min 3X/week      PT Plan Current plan remains appropriate    Co-evaluation              AM-PAC PT "6 Clicks" Mobility   Outcome Measure  Help needed turning from your back to your side while in a flat bed without using bedrails?: None Help needed moving from lying on your back to sitting on the side of a flat bed without using bedrails?: None Help needed moving to and from a bed to a chair  (including a wheelchair)?: None Help needed standing up from a chair using your arms (e.g., wheelchair or bedside chair)?: None Help needed to walk in hospital room?: A Little Help needed climbing 3-5 steps with a railing? : A Little 6 Click Score: 22    End of Session Equipment Utilized During Treatment: Oxygen Activity Tolerance: Treatment limited secondary to medical complications (Comment) (O2 desaturation easily) Patient left: in bed;with call bell/phone within reach;with family/visitor present Nurse Communication: Mobility status PT Visit Diagnosis: Other abnormalities of gait and mobility (R26.89)     Time: 1207-1229 PT Time Calculation (min) (ACUTE ONLY): 22 min  Charges:  $Therapeutic Activity: 8-22 mins                     Abran Richard, PT Acute Rehab [facility] Re[facility] [phone]    Karlton Lemon 06/27/2022, 1:14 PM

## 2022-06-27 NOTE — Progress Notes (Signed)
Progress Note  Patient: Ernest Parker ZYS:[id number] DOB: 1934-06-08  DOA: 06/21/2022  DOS: 06/27/2022    Brief hospital course: Ernest Parker is an 88 y.o. male with a history of AFib, stage III CKD, HTN, HLD, SSS s/p PPM, prostate CA s/p XRT, T2DM, chronic indwelling foley, remote tobacco use, and GERD who presented to the ED with increasing shortness of breath in the setting of rhinorrhea, congestion and cough associated with diffuse weakness. He was found to be hypoxemic with multifocal pneumonia detailed on CXR. Subsequent virus panel positive for rhinovirus. Supportive care with antibiotics for possibility of superimposed CAP was administered. Diuretic IV given, then restarted home PO lasix. The patient's hypoxemia continued. CTA chest showed no PE, but there were extensive infiltrates. PCCM consulted, added steroids. On 10/16, hypoxemia has worsened. Pt is full code.   Assessment and Plan: Acute hypoxic respiratory failure due to rhinovirus multifocal pneumonia: Worsening hypoxemia today with increased left-sided crackles by exam. This correlates to increased density of opacities on CXR.  - CTA chest did not show PE but there are extensive interstitial and alveolar infiltrates bilaterally, some LNs.   - I appreciate PCCM consultation. Echo r/o ASD.  - Increase steroids, empiric IV diuresis, DC amio.  - Monitor closely in PCU, low threshold for ICU transfer.  - Continue IS - We've continued empiric antibiotics, though suspicion for bacterial pneumonia is low. Empirically continuing, though could stop after 7 days.  Chronic HFpEF: LVEF 60-65%, G1DD, mod LAE - Held lasix for AKI, Cr improved, elevated LVEDP, so restarted home lasix '40mg'$  po BID, augmented 10/16 which we'll continue. - Follow up aortic dilatation by echo.   Hypokalemia: Resolved with supplementation.  - Monitor.   Leukocytosis: Developed after steroids. PCT remains 0.15  AKI on stage IIIb CKD: Based on available  values. - Stable CrCl for now.  PAF, sinus node dysfunction s/p PPM: In paced/NSR. - Continue amiodarone, cardizem and eliquis.   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 He is s/p left SFA stent placement 05/17/21 by Dr. Trula Slade - Continue ASA and crestor   NIDT2DM: HbA1c 5.7%.  - SSI   HLD:  - Continue statin  Hx prostate CA s/p XRT, chronic indwelling foley catheter - Foley catheter care   Elevated d-dimer: Negative LE venous U/S and CTA chest PE study.   Obesity: Estimated body mass index is 32.26 kg/m as calculated from the following:   Height as of this encounter: '5\' 9"'$  (1.753 m).   Weight as of this encounter: 99.1 kg.  Subjective: Put on HHFNC yesterday, no new complaints though, no worsening dyspnea or chest pain.   Objective: Vitals:   06/27/22 0321 06/27/22 0705 06/27/22 0841 06/27/22 1239  BP: (!) 163/71  (!) 157/70 (!) 152/63  Pulse: 60 60 60 60  Resp: '18 18 18 18  '$ Temp: (!) 97.3 F (36.3 C)  97.6 F (36.4 C) 97.7 F (36.5 C)  TempSrc: Oral  Oral Oral  SpO2: 92% 91% (!) 89% (!) 89%  Weight:      Height:      Gen: 88 y.o. male in no distress Pulm: Nonlabored breathing 25L/80%. Crackles without wheezes roughly stable from prior. CV: Regular rate and rhythm. No murmur, rub, or gallop. No JVD, no dependent edema. GI: Abdomen soft, non-tender, non-distended,  with normoactive bowel sounds.  Ext: Warm, no deformities Skin: No rashes, lesions or ulcers on visualized skin. Neuro: Alert and oriented. No focal neurological deficits. Psych: Judgement and insight appear fair. Mood euthymic & affect congruent. Behavior is appropriate.    Data Personally reviewed: CBC: Recent Labs  Lab 06/21/22 1526 06/22/22 0430 06/23/22 0500 06/25/22 0514  WBC 10.1 6.3 13.6*  10.7*  NEUTROABS 7.9*  --  11.7*  --   HGB 12.8* 11.7* 11.5* 11.6*  HCT 38.7* 34.1* 34.6* 35.0*  MCV 93.0 91.2 93.0 92.8  PLT 231 219 265 284   Basic Metabolic Panel: Recent Labs  Lab 06/21/22 1526 06/21/22 2036 06/22/22 1606 06/23/22 0500 06/25/22 0514 06/26/22 0445  NA 136  --  138 140 137 137  K 3.2*  --  4.2 3.9 3.8 3.6  CL 100  --  103 108 106 105  CO2 27  --  '26 25 23 24  '$ GLUCOSE 117*  --  105* 124* 136* 173*  BUN 21  --  28* 27* 21 24*  CREATININE 1.98*  --  1.82* 1.58* 1.64* 1.50*  CALCIUM 8.7*  --  9.1 8.5* 8.0* 8.2*  MG  --  2.2  --  2.4  --   --    GFR: Estimated Creatinine Clearance: 41.1 mL/min (A) (by C-G formula based on SCr of 1.5 mg/dL (H)). Liver Function Tests: Recent Labs  Lab 06/21/22 2036  AST 21  ALT 16  ALKPHOS 72  BILITOT 0.8  PROT 7.0  ALBUMIN 2.9*   Urine analysis:    Component Value Date/Time   COLORURINE YELLOW 05/18/2021 1114   APPEARANCEUR TURBID (A) 05/18/2021 1114   LABSPEC 1.010 05/18/2021 1114   PHURINE 8.5 (H) 05/18/2021 1114   GLUCOSEU NEGATIVE 05/18/2021 1114   HGBUR LARGE (A) 05/18/2021 1114   BILIRUBINUR NEGATIVE 05/18/2021 1114   KETONESUR NEGATIVE 05/18/2021 1114   PROTEINUR 100 (A) 05/18/2021 1114   NITRITE NEGATIVE 05/18/2021 1114   LEUKOCYTESUR MODERATE (A) 05/18/2021 1114   Recent Results (from the past 240 hour(s))  Resp Panel by RT-PCR (Flu A&B, Covid) Anterior Nasal Swab     Status: None   Collection Time: 06/21/22 12:24 PM   Specimen: Anterior Nasal Swab  Result Value Ref Range Status   SARS Coronavirus 2 by RT PCR NEGATIVE NEGATIVE Final    Comment: (NOTE) SARS-CoV-2 target nucleic acids are NOT DETECTED.  The SARS-CoV-2 RNA is generally detectable in upper respiratory specimens during the acute phase of infection. The lowest concentration of SARS-CoV-2 viral copies this assay can detect is 138 copies/mL. A negative result does not preclude SARS-Cov-2 infection and should not be used as the sole  basis for treatment or other patient management decisions. A negative result may occur with  improper specimen collection/handling, submission of specimen other than nasopharyngeal swab, presence of viral mutation(s) within the areas targeted by this assay, and inadequate number of viral copies(<138 copies/mL). A negative result must be combined with clinical observations, patient history, and epidemiological information. The expected result is Negative.  Fact Sheet for Patients:  EntrepreneurPulse.com.au  Fact Sheet for Healthcare Providers:  IncredibleEmployment.be  This test is no t yet approved or cleared by the [place] FDA and  has been authorized for detection and/or diagnosis of SARS-CoV-2 by FDA under an Emergency Use Authorization (EUA). This EUA will remain  in effect (meaning this test can be used) for the duration of the COVID-19 declaration under Section 564(b)(1) of the Act, 21 U.S.C.section 360bbb-3(b)(1),  unless the authorization is terminated  or revoked sooner.       Influenza A by PCR NEGATIVE NEGATIVE Final   Influenza B by PCR NEGATIVE NEGATIVE Final    Comment: (NOTE) The Xpert Xpress SARS-CoV-2/FLU/RSV plus assay is intended as an aid in the diagnosis of influenza from Nasopharyngeal swab specimens and should not be used as a sole basis for treatment. Nasal washings and aspirates are unacceptable for Xpert Xpress SARS-CoV-2/FLU/RSV testing.  Fact Sheet for Patients: EntrepreneurPulse.com.au  Fact Sheet for Healthcare Providers: IncredibleEmployment.be  This test is not yet approved or cleared by the [place] FDA and has been authorized for detection and/or diagnosis of SARS-CoV-2 by FDA under an Emergency Use Authorization (EUA). This EUA will remain in effect (meaning this test can be used) for the duration of the COVID-19 declaration under Section 564(b)(1) of the Act,  21 U.S.C. section 360bbb-3(b)(1), unless the authorization is terminated or revoked.  Performed at [facility], [place] [address], [place], [place] [address]   Respiratory (~20 pathogens) panel by PCR     Status: Abnormal   Collection Time: 06/21/22  7:34 PM   Specimen: Nasopharyngeal Swab; Respiratory  Result Value Ref Range Status   Adenovirus NOT DETECTED NOT DETECTED Final   Coronavirus 229E NOT DETECTED NOT DETECTED Final    Comment: (NOTE) The Coronavirus on the Respiratory Panel, DOES NOT test for the novel  Coronavirus (2019 nCoV)    Coronavirus HKU1 NOT DETECTED NOT DETECTED Final   Coronavirus NL63 NOT DETECTED NOT DETECTED Final   Coronavirus OC43 NOT DETECTED NOT DETECTED Final   Metapneumovirus NOT DETECTED NOT DETECTED Final   Rhinovirus / Enterovirus DETECTED (A) NOT DETECTED Final   Influenza A NOT DETECTED NOT DETECTED Final   Influenza B NOT DETECTED NOT DETECTED Final   Parainfluenza Virus 1 NOT DETECTED NOT DETECTED Final   Parainfluenza Virus 2 NOT DETECTED NOT DETECTED Final   Parainfluenza Virus 3 NOT DETECTED NOT DETECTED Final   Parainfluenza Virus 4 NOT DETECTED NOT DETECTED Final   Respiratory Syncytial Virus NOT DETECTED NOT DETECTED Final   Bordetella pertussis NOT DETECTED NOT DETECTED Final   Bordetella Parapertussis NOT DETECTED NOT DETECTED Final   Chlamydophila pneumoniae NOT DETECTED NOT DETECTED Final   Mycoplasma pneumoniae NOT DETECTED NOT DETECTED Final    Comment: Performed at [facility], [place]. [address], [place],  [address]     ECHOCARDIOGRAM LIMITED BUBBLE STUDY  Result Date: 06/27/2022    ECHOCARDIOGRAM LIMITED REPORT   Patient Name:   Ernest Parker Date of Exam: 06/27/2022 Medical Rec #:  [id number]     Height:       69.0 in Accession #:    [id number]    Weight:       218.5 lb Date of Birth:  05/04/1934     BSA:          2.145 m Patient Age:    31 years      BP:           163/71 mmHg Patient Gender: M             HR:            60 bpm. Exam Location:  Inpatient Procedure: Limited Echo and Saline Contrast Bubble Study Indications:    ASD  History:        Patient has prior history of Echocardiogram examinations, most  recent 06/22/2022.  Sonographer:    Harvie Junior Referring Phys: [id number] [place]  1. Limited echo to r/o ASD  2. Left ventricular ejection fraction, by estimation, is 60 to 65%. The left ventricle has normal function. There is mild left ventricular hypertrophy.  3. Right ventricular systolic function is low normal. The right ventricular size is mildly enlarged.  4. Agitated saline contrast bubble study was negative, with no evidence of any interatrial shunt. Comparison(s): Changes from prior study are noted. 06/22/2022: LVEF 60-65%, normal RV function. FINDINGS  Left Ventricle: Left ventricular ejection fraction, by estimation, is 60 to 65%. The left ventricle has normal function. There is mild left ventricular hypertrophy. Right Ventricle: The right ventricular size is mildly enlarged. No increase in right ventricular wall thickness. Right ventricular systolic function is low normal. IAS/Shunts: No atrial level shunt detected by color flow Doppler. Agitated saline contrast was given intravenously to evaluate for intracardiac shunting. Agitated saline contrast bubble study was negative, with no evidence of any interatrial shunt. There  is no evidence of an atrial septal defect. LEFT VENTRICLE PLAX 2D LVIDd:         5.30 cm LVIDs:         3.80 cm LV PW:         1.10 cm LV IVS:        1.10 cm  LEFT ATRIUM         Index LA diam:    4.70 cm 2.19 cm/m Lyman Bishop MD Electronically signed by Lyman Bishop MD Signature Date/Time: 06/27/2022/12:27:59 PM    Final    DG Chest Port 1 View  Result Date: 06/26/2022 CLINICAL DATA:  11031 ARDS (adult respiratory distress syndrome) ([place]) [id number] EXAM: PORTABLE CHEST 1 VIEW COMPARISON:  Radiograph 06/22/2022, CT 06/25/2022 FINDINGS: With the  unchanged large cardiac silhouette with dual chamber pacemaker leads. Persistent multifocal airspace disease bilaterally, similar to recent chest CT, increased in the left upper lobe in comparison prior radiograph on 10/12. No large effusion. No evidence of pneumothorax. No acute osseous abnormality. Thoracic spondylosis. IMPRESSION: Persistent multifocal airspace disease bilaterally, compatible with multifocal pneumonia and/or edema. Electronically Signed   By: Maurine Simmering M.D.   On: 06/26/2022 10:06    Family Communication:  Son, daughter at bedside  Disposition: Status is: Inpatient Remains inpatient appropriate because: Worsening hypoxic respiratory failure  Planned Discharge Destination: TBD  Patrecia Pour, MD 06/27/2022 1:08 PM Page by [email or web address]

## 2022-06-27 NOTE — Progress Notes (Signed)
NAME:  Ernest Parker, MRN:  [id number], DOB:  08/14/1934, LOS: 6 ADMISSION DATE:  06/21/2022, CONSULTATION DATE:  06/25/22 REFERRING MD:  Bonner Puna , CHIEF COMPLAINT:  SOB   History of Present Illness:  88 yo M PMH Afib on amio, CKD III, HTN, SSS s/p PPM, remote tobacco use admitted to [facility] 10/11 for SOB and weakness after first presenting to PCP where he was hypoxic with SpO2 70 on RA. He c/o a dry cough which did not improve with tessalon pearls, and progressive weakness. Did have some URI sx. No sick contacts.He was found to have multifocal airspace disease and positive for rhinovirus. He was hypoxic and required 4LNC.  Azithromycin and Rocephin were added 10/12 for possible superimposed bacterial infection. IV lasix was added 10/13.   O2 needs increased a bit in the coming days. On 10/15, a CTA chest was acquired to r/o PE. There was no evidence of PE but did reveal extensive GGOs and bilateral infiltrates.   PCCM is consulted in this setting.   Normally can do about 15-16 golf holes with cart before getting fatigued. 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  Pertinent  Medical History  Afib Prior tobacco use (30 pack yr hx, quit 40 years ago)  Barrett's esophagus GERD HLD HTN Prostate cancer s/p XRT SSS s/p PPM CKD III PAD Obesity   Significant Hospital Events: Including procedures, antibiotic start and stop dates in addition to other pertinent events   10/11 hypoxic at PCP to 53s. Presented to ED. Admit to [facility] for rhinovirus PNA, hypoxia 10/12 azithro, rocephin added 10/13 Started IV diuresis  10/15 CTA chest no PE, extensive GGOs and bilateral infiltrate. Pulm consult   Interim History / Subjective:   Remains on high flow nasal cannula 80%, 25 L  Objective   Blood pressure (!) 152/63,  pulse 60, temperature 97.7 F (36.5 C), temperature source Oral, resp. rate 18, height '5\' 9"'$  (1.753 m), weight 99.1 kg, SpO2 90 %.    FiO2 (%):  [80 %-90 %] 80 %   Intake/Output Summary (Last 24 hours) at 06/27/2022 1313 Last data filed at 06/26/2022 2332 Gross per 24 hour  Intake --  Output 750 ml  Net -750 ml   Filed Weights   06/24/22 0304  Weight: 99.1 kg    Examination: Blood pressure (!) 152/63, pulse 60, temperature 97.7 F (36.5 C), temperature source Oral, resp. rate 18, height '5\' 9"'$  (1.753 m), weight 99.1 kg, SpO2 90 %. Gen:      No acute distress HEENT:  EOMI, sclera anicteric Neck:     No masses; no thyromegaly Lungs:    Clear to auscultation bilaterally; normal respiratory effort CV:         Regular rate and rhythm; no murmurs Abd:      + bowel sounds; soft, non-tender; no palpable masses, no distension Ext:    No edema; adequate peripheral perfusion Skin:      Warm and dry; no rash Neuro: alert and oriented x 3 Psych: normal mood and affect   New labs or imaging  Resolved Hospital Problem list     Assessment & Plan:  Acute hypoxic respiratory failure Rhinovirus PNA Question superimposed organizing pneumonia Patient is on amiodarone but presentation is not consistent with amiodarone toxicity  Amio held out of  caution Increased dose of steroids yesterday Wean down high flow nasal cannula Procalcitonin is negative and he has finished 5 days of antibiotics for CAP  Best Practice (right click and "Reselect all SmartList Selections" daily)  Per primary  Marshell Garfinkel MD [facility] See Amion for pager  If no response to pager , please call [phone] until 7pm After 7:00 pm call Elink  [phone] 06/27/2022, 1:13 PM

## 2022-06-27 NOTE — TOC Progression Note (Signed)
Transition of Care [facility]) - Progression Note    Patient Details  Name: Ernest Parker MRN: [id number] Date of Birth: 24-Jun-1934  [facility] CM/SW Contact  Pollie Friar, RN Phone Number: 06/27/2022, 12:28 PM  Clinical Narrative:    Pt with increased oxygen requirements today.  No f/u per PT.  Plan is for home with self care when medically ready and daughter will provide transportation. TOC following.    Expected Discharge Plan: Home/Self Care Barriers to Discharge: Continued Medical Work up  Expected Discharge Plan and Services Expected Discharge Plan: Home/Self Care   Discharge Planning Services: CM Consult   Living arrangements for the past 2 months: Single Family Home                                       Social Determinants of Health (SDOH) Interventions    Readmission Risk Interventions     No data to display

## 2022-06-27 NOTE — Progress Notes (Signed)
Echocardiogram 2D Echocardiogram has been performed.  Ernest Parker 06/27/2022, 9:59 AM

## 2022-06-28 DIAGNOSIS — B348 Other viral infections of unspecified site: Secondary | ICD-10-CM

## 2022-06-28 DIAGNOSIS — J129 Viral pneumonia, unspecified: Secondary | ICD-10-CM

## 2022-06-28 LAB — COMPREHENSIVE METABOLIC PANEL
ALT: 16 U/L (ref 0–44)
AST: 16 U/L (ref 15–41)
Albumin: 2.3 g/dL — ABNORMAL LOW (ref 3.5–5.0)
Alkaline Phosphatase: 80 U/L (ref 38–126)
Anion gap: 11 (ref 5–15)
BUN: 46 mg/dL — ABNORMAL HIGH (ref 8–23)
CO2: 26 mmol/L (ref 22–32)
Calcium: 9.1 mg/dL (ref 8.9–10.3)
Chloride: 103 mmol/L (ref 98–111)
Creatinine, Ser: 1.61 mg/dL — ABNORMAL HIGH (ref 0.61–1.24)
GFR, Estimated: 41 mL/min — ABNORMAL LOW (ref >60)
Glucose, Bld: 186 mg/dL — ABNORMAL HIGH (ref 70–99)
Potassium: 4 mmol/L (ref 3.5–5.1)
Sodium: 140 mmol/L (ref 135–145)
Total Bilirubin: 0.7 mg/dL (ref 0.3–1.2)
Total Protein: 5.8 g/dL — ABNORMAL LOW (ref 6.5–8.1)

## 2022-06-28 LAB — CBC
HCT: 35.1 % — ABNORMAL LOW (ref 39.0–52.0)
Hemoglobin: 11.5 g/dL — ABNORMAL LOW (ref 13.0–17.0)
MCH: 30.5 pg (ref 26.0–34.0)
MCHC: 32.8 g/dL (ref 30.0–36.0)
MCV: 93.1 fL (ref 80.0–100.0)
Platelets: 317 10*3/uL (ref 150–400)
RBC: 3.77 MIL/uL — ABNORMAL LOW (ref 4.22–5.81)
RDW: 14.2 % (ref 11.5–15.5)
WBC: 18 10*3/uL — ABNORMAL HIGH (ref 4.0–10.5)
nRBC: 0 % (ref 0.0–0.2)

## 2022-06-28 MED ORDER — FUROSEMIDE 40 MG PO TABS
40.0000 mg | ORAL_TABLET | Freq: Two times a day (BID) | ORAL | Status: DC
  Administered 2022-06-28 – 2022-07-03 (×10): 40 mg via ORAL
  Filled 2022-06-28 (×10): qty 1

## 2022-06-28 NOTE — Progress Notes (Signed)
Progress Note  Patient: Ernest Parker YBO:[id number] DOB: 22-Aug-1934  DOA: 06/21/2022  DOS: 06/28/2022    Brief hospital course: Ernest Parker is an 88 y.o. male with a history of AFib, stage III CKD, HTN, HLD, SSS s/p PPM, prostate CA s/p XRT, T2DM, chronic indwelling foley, remote tobacco use, and GERD who presented to the ED with increasing shortness of breath in the setting of rhinorrhea, congestion and cough associated with diffuse weakness. He was found to be hypoxemic with multifocal pneumonia detailed on CXR. Subsequent virus panel positive for rhinovirus. Supportive care with antibiotics for possibility of superimposed CAP was administered. Diuretic IV given, then restarted home PO lasix. The patient's hypoxemia continued. CTA chest showed no PE, but there were extensive infiltrates. PCCM consulted, added steroids. On 10/16, hypoxemia has worsened. Pt is full code.   Assessment and Plan:  Acute hypoxic respiratory failure due to rhinovirus multifocal pneumonia: Worsening hypoxemia today with increased left-sided crackles by exam. This correlates to increased density of opacities on CXR.  - CTA chest did not show PE but there are extensive interstitial and alveolar infiltrates bilaterally   - PCCM following, appreciate insight recommendations - Echo bubble study negative for shunt.  -Continue steroids, tolerated diuresis well -previously increase steroids -DC amiodarone.  - Continue IS/flutter -Completed empiric antibiotic course (7 days)  Chronic HFpEF: LVEF 60-65%, G1DD, mod LAE - Held initially lasix for AKI, Cr improved, elevated LVEDP - Lasix IV completed, restart home dose 40 p.o. twice daily - Follow up aortic dilatation by echo in the outpatient setting.   Hypokalemia: Resolved with supplementation.  - Monitor.   Leukocytosis: Developed after steroids. PCT remains 0.15  AKI on stage IIIb CKD: Based on available values. - Stable CrCl for now.  PAF, sinus node dysfunction  s/p PPM: In paced/NSR. - Continue amiodarone, cardizem and eliquis.   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 He is s/p left SFA stent placement 05/17/21 by Dr. Trula Slade - Continue ASA and crestor   NIDT2DM: Well controlled - HbA1c 5.7%.  - SSI   HLD:  - Continue statin  Hx prostate CA s/p XRT, chronic indwelling foley catheter - Foley catheter care   Elevated d-dimer: Negative LE venous U/S and CTA chest PE study.   Obesity: Estimated body mass index is 32.26 kg/m as calculated from the following:   Height as of this encounter: '5\' 9"'$  (1.753 m).   Weight as of this encounter: 99.1 kg.  Subjective: Put on HHFNC yesterday, no new complaints though, no worsening dyspnea or chest pain.   Objective: Vitals:   06/28/22 0004 06/28/22 0337 06/28/22 0402 06/28/22 0715  BP: (!) 137/58  (!) 148/68   Pulse: 61 61 60 60  Resp: '18 20 20 18  '$ Temp: 98 F (36.7 C)  97.9 F (36.6 C)   TempSrc: Oral  Oral   SpO2: 94% (!) 88% 91% (!) 87%  Weight:      Height:       Gen: 88 y.o. male in no distress Pulm: Nonlabored breathing 20L/100%.  Right-sided rhonchi greater than left, coarse breath sounds bilaterally, without rales CV: Regular rate and rhythm. No murmur, rub, or gallop. No JVD, no dependent edema. GI: Abdomen soft, non-tender, non-distended, with normoactive bowel sounds.  Ext: Warm, no deformities Skin: No  rashes, lesions or ulcers on visualized skin. Neuro: Alert and oriented. No focal neurological deficits. Psych: Judgement and insight appear fair. Mood euthymic & affect congruent. Behavior is appropriate.    Data Personally reviewed: CBC: Recent Labs  Lab 06/21/22 1526 06/22/22 0430 06/23/22 0500 06/25/22 0514 06/28/22 0347  WBC 10.1 6.3 13.6* 10.7* 18.0*  NEUTROABS 7.9*   --  11.7*  --   --   HGB 12.8* 11.7* 11.5* 11.6* 11.5*  HCT 38.7* 34.1* 34.6* 35.0* 35.1*  MCV 93.0 91.2 93.0 92.8 93.1  PLT 231 219 265 218 761    Basic Metabolic Panel: Recent Labs  Lab 06/21/22 2036 06/22/22 1606 06/23/22 0500 06/25/22 0514 06/26/22 0445 06/28/22 0347  NA  --  138 140 137 137 140  K  --  4.2 3.9 3.8 3.6 4.0  CL  --  103 108 106 105 103  CO2  --  '26 25 23 24 26  '$ GLUCOSE  --  105* 124* 136* 173* 186*  BUN  --  28* 27* 21 24* 46*  CREATININE  --  1.82* 1.58* 1.64* 1.50* 1.61*  CALCIUM  --  9.1 8.5* 8.0* 8.2* 9.1  MG 2.2  --  2.4  --   --   --     GFR: Estimated Creatinine Clearance: 38.2 mL/min (A) (by C-G formula based on SCr of 1.61 mg/dL (H)). Liver Function Tests: Recent Labs  Lab 06/21/22 2036 06/28/22 0347  AST 21 16  ALT 16 16  ALKPHOS 72 80  BILITOT 0.8 0.7  PROT 7.0 5.8*  ALBUMIN 2.9* 2.3*    Urine analysis:    Component Value Date/Time   COLORURINE YELLOW 05/18/2021 1114   APPEARANCEUR TURBID (A) 05/18/2021 1114   LABSPEC 1.010 05/18/2021 1114   PHURINE 8.5 (H) 05/18/2021 1114   GLUCOSEU NEGATIVE 05/18/2021 1114   HGBUR LARGE (A) 05/18/2021 1114   BILIRUBINUR NEGATIVE 05/18/2021 1114   KETONESUR NEGATIVE 05/18/2021 1114   PROTEINUR 100 (A) 05/18/2021 1114   NITRITE NEGATIVE 05/18/2021 1114   LEUKOCYTESUR MODERATE (A) 05/18/2021 1114   Recent Results (from the past 240 hour(s))  Resp Panel by RT-PCR (Flu A&B, Covid) Anterior Nasal Swab     Status: None   Collection Time: 06/21/22 12:24 PM   Specimen: Anterior Nasal Swab  Result Value Ref Range Status   SARS Coronavirus 2 by RT PCR NEGATIVE NEGATIVE Final    Comment: (NOTE) SARS-CoV-2 target nucleic acids are NOT DETECTED.  The SARS-CoV-2 RNA is generally detectable in upper respiratory specimens during the acute phase of infection. The lowest concentration of SARS-CoV-2 viral copies this assay can detect is 138 copies/mL. A negative result does not preclude  SARS-Cov-2 infection and should not be used as the sole basis for treatment or other patient management decisions. A negative result may occur with  improper specimen collection/handling, submission of specimen other than nasopharyngeal swab, presence of viral mutation(s) within the areas targeted by this assay, and inadequate number of viral copies(<138 copies/mL). A negative result must be combined with clinical observations, patient history, and epidemiological information. The expected result is Negative.  Fact Sheet for Patients:  EntrepreneurPulse.com.au  Fact Sheet for Healthcare Providers:  IncredibleEmployment.be  This test is no t yet approved or cleared by the [place] FDA and  has been authorized for detection and/or diagnosis of SARS-CoV-2 by FDA under an Emergency Use Authorization (EUA). This EUA will remain  in effect (meaning this test can be used) for the duration of the COVID-19  declaration under Section 564(b)(1) of the Act, 21 U.S.C.section 360bbb-3(b)(1), unless the authorization is terminated  or revoked sooner.       Influenza A by PCR NEGATIVE NEGATIVE Final   Influenza B by PCR NEGATIVE NEGATIVE Final    Comment: (NOTE) The Xpert Xpress SARS-CoV-2/FLU/RSV plus assay is intended as an aid in the diagnosis of influenza from Nasopharyngeal swab specimens and should not be used as a sole basis for treatment. Nasal washings and aspirates are unacceptable for Xpert Xpress SARS-CoV-2/FLU/RSV testing.  Fact Sheet for Patients: EntrepreneurPulse.com.au  Fact Sheet for Healthcare Providers: IncredibleEmployment.be  This test is not yet approved or cleared by the [place] FDA and has been authorized for detection and/or diagnosis of SARS-CoV-2 by FDA under an Emergency Use Authorization (EUA). This EUA will remain in effect (meaning this test can be used) for the duration of  the COVID-19 declaration under Section 564(b)(1) of the Act, 21 U.S.C. section 360bbb-3(b)(1), unless the authorization is terminated or revoked.  Performed at [facility], [place] [address], [place], [place] [address]   Respiratory (~20 pathogens) panel by PCR     Status: Abnormal   Collection Time: 06/21/22  7:34 PM   Specimen: Nasopharyngeal Swab; Respiratory  Result Value Ref Range Status   Adenovirus NOT DETECTED NOT DETECTED Final   Coronavirus 229E NOT DETECTED NOT DETECTED Final    Comment: (NOTE) The Coronavirus on the Respiratory Panel, DOES NOT test for the novel  Coronavirus (2019 nCoV)    Coronavirus HKU1 NOT DETECTED NOT DETECTED Final   Coronavirus NL63 NOT DETECTED NOT DETECTED Final   Coronavirus OC43 NOT DETECTED NOT DETECTED Final   Metapneumovirus NOT DETECTED NOT DETECTED Final   Rhinovirus / Enterovirus DETECTED (A) NOT DETECTED Final   Influenza A NOT DETECTED NOT DETECTED Final   Influenza B NOT DETECTED NOT DETECTED Final   Parainfluenza Virus 1 NOT DETECTED NOT DETECTED Final   Parainfluenza Virus 2 NOT DETECTED NOT DETECTED Final   Parainfluenza Virus 3 NOT DETECTED NOT DETECTED Final   Parainfluenza Virus 4 NOT DETECTED NOT DETECTED Final   Respiratory Syncytial Virus NOT DETECTED NOT DETECTED Final   Bordetella pertussis NOT DETECTED NOT DETECTED Final   Bordetella Parapertussis NOT DETECTED NOT DETECTED Final   Chlamydophila pneumoniae NOT DETECTED NOT DETECTED Final   Mycoplasma pneumoniae NOT DETECTED NOT DETECTED Final    Comment: Performed at [facility], [place]. [address] [place],  [address]     ECHOCARDIOGRAM LIMITED BUBBLE STUDY  Result Date: 06/27/2022    ECHOCARDIOGRAM LIMITED REPORT   Patient Name:   Ernest Parker Date of Exam: 06/27/2022 Medical Rec #:  [id number]     Height:       69.0 in Accession #:    [id number]    Weight:       218.5 lb Date of Birth:  10-28-1933     BSA:          2.145 m Patient Age:    47 years       BP:           163/71 mmHg Patient Gender: M             HR:           60 bpm. Exam Location:  Inpatient Procedure: Limited Echo and Saline Contrast Bubble Study Indications:    ASD  History:        Patient has prior history of Echocardiogram examinations, most  recent 06/22/2022.  Sonographer:    Harvie Junior Referring Phys: [id number] [place]  1. Limited echo to r/o ASD  2. Left ventricular ejection fraction, by estimation, is 60 to 65%. The left ventricle has normal function. There is mild left ventricular hypertrophy.  3. Right ventricular systolic function is low normal. The right ventricular size is mildly enlarged.  4. Agitated saline contrast bubble study was negative, with no evidence of any interatrial shunt. Comparison(s): Changes from prior study are noted. 06/22/2022: LVEF 60-65%, normal RV function. FINDINGS  Left Ventricle: Left ventricular ejection fraction, by estimation, is 60 to 65%. The left ventricle has normal function. There is mild left ventricular hypertrophy. Right Ventricle: The right ventricular size is mildly enlarged. No increase in right ventricular wall thickness. Right ventricular systolic function is low normal. IAS/Shunts: No atrial level shunt detected by color flow Doppler. Agitated saline contrast was given intravenously to evaluate for intracardiac shunting. Agitated saline contrast bubble study was negative, with no evidence of any interatrial shunt. There  is no evidence of an atrial septal defect. LEFT VENTRICLE PLAX 2D LVIDd:         5.30 cm LVIDs:         3.80 cm LV PW:         1.10 cm LV IVS:        1.10 cm  LEFT ATRIUM         Index LA diam:    4.70 cm 2.19 cm/m Lyman Bishop MD Electronically signed by Lyman Bishop MD Signature Date/Time: 06/27/2022/12:27:59 PM    Final    DG Chest Port 1 View  Result Date: 06/26/2022 CLINICAL DATA:  [id number] ARDS (adult respiratory distress syndrome) ([place]) [id number] EXAM: PORTABLE CHEST 1 VIEW COMPARISON:   Radiograph 06/22/2022, CT 06/25/2022 FINDINGS: With the unchanged large cardiac silhouette with dual chamber pacemaker leads. Persistent multifocal airspace disease bilaterally, similar to recent chest CT, increased in the left upper lobe in comparison prior radiograph on 10/12. No large effusion. No evidence of pneumothorax. No acute osseous abnormality. Thoracic spondylosis. IMPRESSION: Persistent multifocal airspace disease bilaterally, compatible with multifocal pneumonia and/or edema. Electronically Signed   By: Maurine Simmering M.D.   On: 06/26/2022 10:06    Family Communication:  Son at bedside  Disposition: Status is: Inpatient Remains inpatient appropriate because: Worsening hypoxic respiratory failure  Planned Discharge Destination: TBD  Holli Humbles DO 06/28/2022 7:55 AM Page by [email or web address]

## 2022-06-28 NOTE — Progress Notes (Signed)
NAME:  Ernest Parker, MRN:  [id number], DOB:  1934/04/30, LOS: 7 ADMISSION DATE:  06/21/2022, CONSULTATION DATE:  06/25/22 REFERRING MD:  Bonner Puna , CHIEF COMPLAINT:  SOB   History of Present Illness:  88 yo M PMH Afib on amio, CKD III, HTN, SSS s/p PPM, remote tobacco use admitted to [facility] 10/11 for SOB and weakness after first presenting to PCP where he was hypoxic with SpO2 70 on RA. He c/o a dry cough which did not improve with tessalon pearls, and progressive weakness. Did have some URI sx. No sick contacts.He was found to have multifocal airspace disease and positive for rhinovirus. He was hypoxic and required 4LNC.  Azithromycin and Rocephin were added 10/12 for possible superimposed bacterial infection. IV lasix was added 10/13.   O2 needs increased a bit in the coming days. On 10/15, a CTA chest was acquired to r/o PE. There was no evidence of PE but did reveal extensive GGOs and bilateral infiltrates.   PCCM is consulted in this setting.   Normally can do about 15-16 golf holes with cart before getting fatigued. 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  Pertinent  Medical History  Afib Prior tobacco use (30 pack yr hx, quit 40 years ago)  Barrett's esophagus GERD HLD HTN Prostate cancer s/p XRT SSS s/p PPM CKD III PAD Obesity   Significant Hospital Events: Including procedures, antibiotic start and stop dates in addition to other pertinent events   10/11 hypoxic at PCP to 60s. Presented to ED. Admit to [facility] for rhinovirus PNA, hypoxia 10/12 azithro, rocephin added 10/13 Started IV diuresis  10/15 CTA chest no PE, extensive GGOs and bilateral infiltrate. Pulm consult  10/16 steroid dose increased 10/17: Remains on high flow nasal cannula 80%, 25 L  Interim History / Subjective:   Feels   better but O2 needs up   Objective   Blood pressure (Abnormal) 150/64, pulse 60, temperature 97.9 F (36.6 C), temperature source Oral, resp. rate 18, height '5\' 9"'$  (1.753 m), weight 99.1 kg, SpO2 (Abnormal) 87 %.    FiO2 (%):  [80 %-100 %] 100 %   Intake/Output Summary (Last 24 hours) at 06/28/2022 1112 Last data filed at 06/28/2022 0955 Gross per 24 hour  Intake no documentation  Output 1500 ml  Net -1500 ml   Filed Weights   06/24/22 0304  Weight: 99.1 kg    Examination:  General: 88 year old male patient currently sitting up in bed he is in no acute distress, however he does remain on high flow oxygen HEENT normocephalic atraumatic no jugular venous distention appreciated mucous membranes are moist Pulmonary: Posterior rales, no rhonchi, currently 100%, 35 L high flow Cardiac: Regular rate no murmur rub or gallop Abdomen: Soft nontender Extremities: Warm dry brisk cap refill no edema.  Resolved Hospital Problem list     Assessment & Plan:  Acute hypoxic respiratory failure Rhinovirus PNA Question superimposed organizing pneumonia Patient is on amiodarone but presentation is not consistent with amiodarone toxicity  Completed 5d CAP coverage Plan Cont to hold amio out of caution  Cont steroids Daily assessment for diuresis  Will place on cont pulse ox hopefully that will encourage more aggressive O2 titration Am cxr  Best Practice (right click and "Reselect all SmartList Selections" daily)  Per primary  Ottis Stain  [facility] ACNP-BC [place] Pager # [phone] OR # [phone] if no answer

## 2022-06-29 ENCOUNTER — Inpatient Hospital Stay (HOSPITAL_COMMUNITY): Payer: Medicare Other

## 2022-06-29 DIAGNOSIS — J189 Pneumonia, unspecified organism: Secondary | ICD-10-CM | POA: Diagnosis not present

## 2022-06-29 DIAGNOSIS — B348 Other viral infections of unspecified site: Secondary | ICD-10-CM | POA: Diagnosis not present

## 2022-06-29 DIAGNOSIS — J9601 Acute respiratory failure with hypoxia: Secondary | ICD-10-CM | POA: Diagnosis not present

## 2022-06-29 DIAGNOSIS — J129 Viral pneumonia, unspecified: Secondary | ICD-10-CM | POA: Diagnosis not present

## 2022-06-29 NOTE — Progress Notes (Signed)
Progress Note  Patient: Ernest Parker OZH:[id number] DOB: [birth year]  DOA: 06/21/2022  DOS: 06/29/2022    Brief hospital course: Ernest Parker is an 88 y.o. male with a history of AFib, stage III CKD, HTN, HLD, SSS s/p PPM, prostate CA s/p XRT, T2DM, chronic indwelling foley, remote tobacco use, and GERD who presented to the ED with increasing shortness of breath in the setting of rhinorrhea, congestion and cough associated with diffuse weakness. He was found to be hypoxemic with multifocal pneumonia detailed on CXR. Subsequent virus panel positive for rhinovirus. Supportive care with antibiotics for possibility of superimposed CAP was administered. Diuretic IV given, then restarted home PO lasix. The patient's hypoxemia continued. CTA chest showed no PE, but there were extensive infiltrates. PCCM consulted, added steroids. On 10/16, hypoxemia has worsened. Pt is full code.   Assessment and Plan:  Acute hypoxic respiratory failure due to rhinovirus multifocal pneumonia: Worsening hypoxemia today with increased left-sided crackles by exam. This correlates to increased density of opacities on CXR.  - CTA chest did not show PE but there are extensive interstitial and alveolar infiltrates bilaterally   -Clinically patient appears to be improving daily although oxygen levels remain somewhat stable SpO2: 92 % O2 Flow Rate (L/min): (S) 35 L/min FiO2 (%): (S) 85 % - PCCM following, appreciate insight/recommendations - Echo bubble study negative for shunt.  -Continue steroids, tolerated diuresis well -previously increase steroids -DC amiodarone.  - Continue IS/flutter -Completed empiric antibiotic course (7 days)  Chronic HFpEF: LVEF 60-65%, G1DD, mod LAE - Held initially lasix for AKI, Cr improved, elevated LVEDP - Lasix IV completed, restart home dose 40 p.o. twice daily - Follow up aortic dilatation by echo in the outpatient setting.   Hypokalemia: Resolved with supplementation.  - Monitor.    Leukocytosis: Developed after steroids. PCT remains 0.15  AKI on stage IIIb CKD: Based on available values. - Stable CrCl for now.  PAF, sinus node dysfunction s/p PPM: In paced/NSR. - Continue amiodarone, cardizem and eliquis.   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 He is s/p left SFA stent placement 05/17/21 by Dr. Trula Slade - Continue ASA and crestor   NIDT2DM: Well controlled - HbA1c 5.7%.  - SSI   HLD:  - Continue statin  Hx prostate CA s/p XRT, chronic indwelling foley catheter - Foley catheter care   Elevated d-dimer: Negative LE venous U/S and CTA chest PE study.   Obesity: Estimated body mass index is 32.26 kg/m as calculated from the following:   Height as of this encounter: '5\' 9"'$  (1.753 m).   Weight as of this encounter: 99.1 kg.  Subjective: No acute issues or events overnight, respiratory status is stable, limited ambulation due to dyspnea with exertion but otherwise at rest patient feels much improved.   Objective: Vitals:   06/28/22 2342 06/29/22 0237 06/29/22 0400 06/29/22 0409  BP: (!) 152/64 (!) 152/64  (!) 156/74  Pulse: 60 68  (!) 58  Resp: '20 18  19  '$ Temp: 98 F (36.7 C)   97.7 F (36.5 C)  TempSrc: Oral   Oral  SpO2: 91% 92% 90% 93%  Weight:      Height:       Gen: 88 y.o. male in no distress Pulm: Right-sided rhonchi greater than left, coarse breath sounds  bilaterally, without rales CV: Regular rate and rhythm. No murmur, rub, or gallop. No JVD, no dependent edema. GI: Abdomen soft, non-tender, non-distended, with normoactive bowel sounds.  Ext: Warm, no deformities Skin: No rashes, lesions or ulcers on visualized skin. Neuro: Alert and oriented. No focal neurological deficits. Psych: Judgement and insight appear fair. Mood euthymic & affect  congruent. Behavior is appropriate.    Data Personally reviewed: CBC: Recent Labs  Lab 06/23/22 0500 06/25/22 0514 06/28/22 0347  WBC 13.6* 10.7* 18.0*  NEUTROABS 11.7*  --   --   HGB 11.5* 11.6* 11.5*  HCT 34.6* 35.0* 35.1*  MCV 93.0 92.8 93.1  PLT 265 218 161    Basic Metabolic Panel: Recent Labs  Lab 06/22/22 1606 06/23/22 0500 06/25/22 0514 06/26/22 0445 06/28/22 0347  NA 138 140 137 137 140  K 4.2 3.9 3.8 3.6 4.0  CL 103 108 106 105 103  CO2 '26 25 23 24 26  '$ GLUCOSE 105* 124* 136* 173* 186*  BUN 28* 27* 21 24* 46*  CREATININE 1.82* 1.58* 1.64* 1.50* 1.61*  CALCIUM 9.1 8.5* 8.0* 8.2* 9.1  MG  --  2.4  --   --   --     GFR: Estimated Creatinine Clearance: 38.2 mL/min (A) (by C-G formula based on SCr of 1.61 mg/dL (H)). Liver Function Tests: Recent Labs  Lab 06/28/22 0347  AST 16  ALT 16  ALKPHOS 80  BILITOT 0.7  PROT 5.8*  ALBUMIN 2.3*    Urine analysis:    Component Value Date/Time   COLORURINE YELLOW 05/18/2021 1114   APPEARANCEUR TURBID (A) 05/18/2021 1114   LABSPEC 1.010 05/18/2021 1114   PHURINE 8.5 (H) 05/18/2021 1114   GLUCOSEU NEGATIVE 05/18/2021 1114   HGBUR LARGE (A) 05/18/2021 1114   BILIRUBINUR NEGATIVE 05/18/2021 1114   KETONESUR NEGATIVE 05/18/2021 1114   PROTEINUR 100 (A) 05/18/2021 1114   NITRITE NEGATIVE 05/18/2021 1114   LEUKOCYTESUR MODERATE (A) 05/18/2021 1114   Recent Results (from the past 240 hour(s))  Resp Panel by RT-PCR (Flu A&B, Covid) Anterior Nasal Swab     Status: None   Collection Time: 06/21/22 12:24 PM   Specimen: Anterior Nasal Swab  Result Value Ref Range Status   SARS Coronavirus 2 by RT PCR NEGATIVE NEGATIVE Final    Comment: (NOTE) SARS-CoV-2 target nucleic acids are NOT DETECTED.  The SARS-CoV-2 RNA is generally detectable in upper respiratory specimens during the acute phase of infection. The lowest concentration of SARS-CoV-2 viral copies this assay can detect is 138 copies/mL. A negative result  does not preclude SARS-Cov-2 infection and should not be used as the sole basis for treatment or other patient management decisions. A negative result may occur with  improper specimen collection/handling, submission of specimen other than nasopharyngeal swab, presence of viral mutation(s) within the areas targeted by this assay, and inadequate number of viral copies(<138 copies/mL). A negative result must be combined with clinical observations, patient history, and epidemiological information. The expected result is Negative.  Fact Sheet for Patients:  EntrepreneurPulse.com.au  Fact Sheet for Healthcare Providers:  IncredibleEmployment.be  This test is no t yet approved or cleared by the [place] FDA and  has been authorized for detection and/or diagnosis of SARS-CoV-2 by FDA under an Emergency Use Authorization (EUA). This EUA will remain  in effect (meaning this test can be used) for the duration of the COVID-19 declaration under Section 564(b)(1) of the Act, 21 U.S.C.section 360bbb-3(b)(1), unless the authorization is terminated  or revoked sooner.  Influenza A by PCR NEGATIVE NEGATIVE Final   Influenza B by PCR NEGATIVE NEGATIVE Final    Comment: (NOTE) The Xpert Xpress SARS-CoV-2/FLU/RSV plus assay is intended as an aid in the diagnosis of influenza from Nasopharyngeal swab specimens and should not be used as a sole basis for treatment. Nasal washings and aspirates are unacceptable for Xpert Xpress SARS-CoV-2/FLU/RSV testing.  Fact Sheet for Patients: EntrepreneurPulse.com.au  Fact Sheet for Healthcare Providers: IncredibleEmployment.be  This test is not yet approved or cleared by the [place] FDA and has been authorized for detection and/or diagnosis of SARS-CoV-2 by FDA under an Emergency Use Authorization (EUA). This EUA will remain in effect (meaning this test can be used) for  the duration of the COVID-19 declaration under Section 564(b)(1) of the Act, 21 U.S.C. section 360bbb-3(b)(1), unless the authorization is terminated or revoked.  Performed at [facility], [place] [address], [place], [place] [address]   Respiratory (~20 pathogens) panel by PCR     Status: Abnormal   Collection Time: 06/21/22  7:34 PM   Specimen: Nasopharyngeal Swab; Respiratory  Result Value Ref Range Status   Adenovirus NOT DETECTED NOT DETECTED Final   Coronavirus 229E NOT DETECTED NOT DETECTED Final    Comment: (NOTE) The Coronavirus on the Respiratory Panel, DOES NOT test for the novel  Coronavirus (2019 nCoV)    Coronavirus HKU1 NOT DETECTED NOT DETECTED Final   Coronavirus NL63 NOT DETECTED NOT DETECTED Final   Coronavirus OC43 NOT DETECTED NOT DETECTED Final   Metapneumovirus NOT DETECTED NOT DETECTED Final   Rhinovirus / Enterovirus DETECTED (A) NOT DETECTED Final   Influenza A NOT DETECTED NOT DETECTED Final   Influenza B NOT DETECTED NOT DETECTED Final   Parainfluenza Virus 1 NOT DETECTED NOT DETECTED Final   Parainfluenza Virus 2 NOT DETECTED NOT DETECTED Final   Parainfluenza Virus 3 NOT DETECTED NOT DETECTED Final   Parainfluenza Virus 4 NOT DETECTED NOT DETECTED Final   Respiratory Syncytial Virus NOT DETECTED NOT DETECTED Final   Bordetella pertussis NOT DETECTED NOT DETECTED Final   Bordetella Parapertussis NOT DETECTED NOT DETECTED Final   Chlamydophila pneumoniae NOT DETECTED NOT DETECTED Final   Mycoplasma pneumoniae NOT DETECTED NOT DETECTED Final    Comment: Performed at [facility], [place], [place], [place] [address]     ECHOCARDIOGRAM LIMITED BUBBLE STUDY  Result Date: 06/27/2022    ECHOCARDIOGRAM LIMITED REPORT   Patient Name:   Ernest Parker Date of Exam: 06/27/2022 Medical Rec #:  [id number]     Height:       69.0 in Accession #:    [id number]    Weight:       218.5 lb Date of Birth:  12-Jul-1934     BSA:          2.145 m Patient Age:     70 years      BP:           163/71 mmHg Patient Gender: M             HR:           60 bpm. Exam Location:  Inpatient Procedure: Limited Echo and Saline Contrast Bubble Study Indications:    ASD  History:        Patient has prior history of Echocardiogram examinations, most                 recent 06/22/2022.  Sonographer:    Harvie Junior Referring  Phys: [id number] [place]  1. Limited echo to r/o ASD  2. Left ventricular ejection fraction, by estimation, is 60 to 65%. The left ventricle has normal function. There is mild left ventricular hypertrophy.  3. Right ventricular systolic function is low normal. The right ventricular size is mildly enlarged.  4. Agitated saline contrast bubble study was negative, with no evidence of any interatrial shunt. Comparison(s): Changes from prior study are noted. 06/22/2022: LVEF 60-65%, normal RV function. FINDINGS  Left Ventricle: Left ventricular ejection fraction, by estimation, is 60 to 65%. The left ventricle has normal function. There is mild left ventricular hypertrophy. Right Ventricle: The right ventricular size is mildly enlarged. No increase in right ventricular wall thickness. Right ventricular systolic function is low normal. IAS/Shunts: No atrial level shunt detected by color flow Doppler. Agitated saline contrast was given intravenously to evaluate for intracardiac shunting. Agitated saline contrast bubble study was negative, with no evidence of any interatrial shunt. There  is no evidence of an atrial septal defect. LEFT VENTRICLE PLAX 2D LVIDd:         5.30 cm LVIDs:         3.80 cm LV PW:         1.10 cm LV IVS:        1.10 cm  LEFT ATRIUM         Index LA diam:    4.70 cm 2.19 cm/m Lyman Bishop MD Electronically signed by Lyman Bishop MD Signature Date/Time: 06/27/2022/12:27:59 PM    Final     Family Communication:  Son at bedside  Disposition: Status is: Inpatient Remains inpatient appropriate because: Worsening hypoxic respiratory  failure  Planned Discharge Destination: TBD  Holli Humbles DO 06/29/2022 7:42 AM Page by [email or web address]

## 2022-06-29 NOTE — Progress Notes (Signed)
Physical Therapy Treatment Patient Details Name: Ernest Parker MRN: [id number] DOB: 05-Jun-1934 Today's Date: 06/29/2022   History of Present Illness Pt is 88 y.o. male  who presented to ED 06/21/22 with complaints of shortness of breath. +pna and rhinovirus.  Requiring heated high flow O2;  PMH significant of atrial fibrillation, CKD stage 3, GERD, HLD, HTN, sinus node dysfunction s/p PPM, prostate cancer s/p radiation, T2DM, chronic indwelling foley    PT Comments    Pt tolerated 3 bouts of exercise, 2 in standing, 1 in sitting. Standing exercise resulted in an initial increase in SPO2 to low 90's but then drop to 79%. Similar in sitting but drop only to 83%. Pt required 3-4 mins seated rest between bouts to recover to 85%. Worked on pursed lip breathing throughout exercises and mobility. Pt seated in recliner end of session. PT will continue to follow.     Recommendations for follow up therapy are one component of a multi-disciplinary discharge planning process, led by the attending physician.  Recommendations may be updated based on patient status, additional functional criteria and insurance authorization.  Follow Up Recommendations  No PT follow up     Assistance Recommended at Discharge Intermittent Supervision/Assistance  Patient can return home with the following Assistance with cooking/housework;Help with stairs or ramp for entrance   Equipment Recommendations  None recommended by PT    Recommendations for Other Services       Precautions / Restrictions Precautions Precautions: Other (comment) Precaution Comments: desaturates quickly Restrictions Weight Bearing Restrictions: No     Mobility  Bed Mobility Overal bed mobility: Independent                  Transfers Overall transfer level: Needs assistance Equipment used: None Transfers: Sit to/from Stand Sit to Stand: Supervision           General transfer comment: supervision for monitoring and lines  for sit>stand and step pivot    Ambulation/Gait               General Gait Details: pt on HFNC and desating very quickly, ambulated fwd/ bkwd 5' staying on HFNC   Stairs             Wheelchair Mobility    Modified Rankin (Stroke Patients Only)       Balance Overall balance assessment: Needs assistance   Sitting balance-Leahy Scale: Normal     Standing balance support: No upper extremity supported Standing balance-Leahy Scale: Good Standing balance comment: no LOB with dynamic standing                            Cognition Arousal/Alertness: Awake/alert Behavior During Therapy: WFL for tasks assessed/performed Overall Cognitive Status: Within Functional Limits for tasks assessed                                          Exercises Other Exercises Other Exercises: standing side stepping, 5x each side, fwd/back ambulation, standing chest stretch Other Exercises: mini squats 10x, heel lifts 10x, marching in place 10x Other Exercises: seated modified crunches, seated obliques, LAQ x10, seated marhcing x10 Other Exercises: pursed lip breathing throughout and focused during rest breaks    General Comments General comments (skin integrity, edema, etc.): pt on 35 L 90% FiO2, sats dropped as low as 78% with standing exercises, 83% with  seated exercises. Took 3 mins to recover to 85% after each bout of exercise      Pertinent Vitals/Pain Pain Assessment Pain Assessment: No/denies pain Faces Pain Scale: No hurt    Home Living                          Prior Function            PT Goals (current goals can now be found in the care plan section) Acute Rehab PT Goals Patient Stated Goal: go home today PT Goal Formulation: With patient Time For Goal Achievement: 07/11/22 Potential to Achieve Goals: Good Progress towards PT goals: Progressing toward goals    Frequency    Min 3X/week      PT Plan Current plan  remains appropriate    Co-evaluation              AM-PAC PT "6 Clicks" Mobility   Outcome Measure  Help needed turning from your back to your side while in a flat bed without using bedrails?: None Help needed moving from lying on your back to sitting on the side of a flat bed without using bedrails?: None Help needed moving to and from a bed to a chair (including a wheelchair)?: None Help needed standing up from a chair using your arms (e.g., wheelchair or bedside chair)?: None Help needed to walk in hospital room?: A Little Help needed climbing 3-5 steps with a railing? : A Little 6 Click Score: 22    End of Session Equipment Utilized During Treatment: Oxygen Activity Tolerance: Treatment limited secondary to medical complications (Comment) (O2 desaturation easily) Patient left: with call bell/phone within reach;with family/visitor present;in chair Nurse Communication: Mobility status PT Visit Diagnosis: Other abnormalities of gait and mobility (R26.89)     Time: 1230-1300 PT Time Calculation (min) (ACUTE ONLY): 30 min  Charges:  $Therapeutic Exercise: 23-37 mins                     Leighton Roach, PT  Acute Rehab Services Secure chat preferred Office [place] 06/29/2022, 2:39 PM

## 2022-06-29 NOTE — Plan of Care (Signed)
  Problem: Clinical Measurements: Goal: Ability to maintain a body temperature in the normal range will improve Outcome: Progressing   Problem: Respiratory: Goal: Ability to maintain a clear airway will improve Outcome: Progressing   Problem: Education: Goal: Knowledge of General Education information will improve Description: Including pain rating scale, medication(s)/side effects and non-pharmacologic comfort measures Outcome: Progressing   Problem: Health Behavior/Discharge Planning: Goal: Ability to manage health-related needs will improve Outcome: Progressing   Problem: Clinical Measurements: Goal: Ability to maintain clinical measurements within normal limits will improve Outcome: Progressing Goal: Will remain free from infection Outcome: Progressing Goal: Diagnostic test results will improve Outcome: Progressing Goal: Respiratory complications will improve Outcome: Progressing Goal: Cardiovascular complication will be avoided Outcome: Progressing   Problem: Activity: Goal: Risk for activity intolerance will decrease Outcome: Progressing   Problem: Nutrition: Goal: Adequate nutrition will be maintained Outcome: Progressing   Problem: Coping: Goal: Level of anxiety will decrease Outcome: Progressing   Problem: Elimination: Goal: Will not experience complications related to bowel motility Outcome: Progressing Goal: Will not experience complications related to urinary retention Outcome: Progressing   Problem: Pain Managment: Goal: General experience of comfort will improve Outcome: Progressing   Problem: Safety: Goal: Ability to remain free from injury will improve Outcome: Progressing   Problem: Skin Integrity: Goal: Risk for impaired skin integrity will decrease Outcome: Progressing

## 2022-06-29 NOTE — Progress Notes (Signed)
NAME:  Ernest Parker, MRN:  [id number], DOB:  [birth year], LOS: 8 ADMISSION DATE:  06/21/2022, CONSULTATION DATE:  06/25/22 REFERRING MD:  Bonner Puna , CHIEF COMPLAINT:  SOB   History of Present Illness:  88 yo M PMH Afib on amio, CKD III, HTN, SSS s/p PPM, remote tobacco use admitted to [facility] 10/11 for SOB and weakness after first presenting to PCP where he was hypoxic with SpO2 70 on RA. He c/o a dry cough which did not improve with tessalon pearls, and progressive weakness. Did have some URI sx. No sick contacts.He was found to have multifocal airspace disease and positive for rhinovirus. He was hypoxic and required 4LNC.  Azithromycin and Rocephin were added 10/12 for possible superimposed bacterial infection. IV lasix was added 10/13.   O2 needs increased a bit in the coming days. On 10/15, a CTA chest was acquired to r/o PE. There was no evidence of PE but did reveal extensive GGOs and bilateral infiltrates.   PCCM is consulted in this setting.   Normally can do about 15-16 golf holes with cart before getting fatigued. 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  Pertinent  Medical History  Afib Prior tobacco use (30 pack yr hx, quit 40 years ago)  Barrett's esophagus GERD HLD HTN Prostate cancer s/p XRT SSS s/p PPM CKD III PAD Obesity   Significant Hospital Events: Including procedures, antibiotic start and stop dates in addition to other pertinent events   10/11 hypoxic at PCP to 25s. Presented to ED. Admit to [facility] for rhinovirus PNA, hypoxia 10/12 azithro, rocephin added 10/13 Started IV diuresis  10/15 CTA chest no PE, extensive GGOs and bilateral infiltrate. Pulm consult  10/16 steroid dose increased 10/17: Remains on high flow nasal cannula 80%, 25 L  Interim History / Subjective:   Feeling  better today  Difficult to hear over sound of HFNC   On FiO2 85% 35 L/min  Objective   Blood pressure (!) 152/63, pulse 67, temperature 98 F (36.7 C), temperature source Oral, resp. rate 18, height '5\' 9"'$  (1.753 m), weight 99.1 kg, SpO2 91 %.    FiO2 (%):  [85 %-90 %] 85 %   Intake/Output Summary (Last 24 hours) at 06/29/2022 1020 Last data filed at 06/29/2022 0403 Gross per 24 hour  Intake 320 ml  Output 1125 ml  Net -805 ml   Filed Weights   06/24/22 0304  Weight: 99.1 kg    Examination:  General:  WDWN older adult M HEENT NCAT pink mm HHFNC  Pulmonary:  Symmetrical chest expansion. Unlabored on [facility] Cardiac: rrr s1s2  Abdomen:  sfot ndnt  Extremities: no acute joint deformity Skin: c/d/w   Resolved Hospital Problem list     Assessment & Plan:     Acute respiratory failure with hypoxia Rhinovirus PNA Possible superimposed organizing PNA  Leukocytosis -presentation not very c/w amio lung tox, but this has been dc.  He was started on steroids 10/15, and completed 5d abx CAP coverage -PCT on 10/16 was 0.15. his white count has bumped from 10.7 (10/15) to 18 (10/18) likely due to steroids  -CXR 10/19 with increased bilateral interstitial opacities. ? Component of edema contributing here vs developing a fibrotic process. Looking at prior wonder if Feb 2023 cxr was showing early fibrotic changes.  Plan -  Cont solumedrol, PPI in this setting -Cont holding amio  -Lasix  -PRN CXR  -bronchodilators -IS, mobility   Best Practice (right click and "Reselect all SmartList Selections" daily)  Per primary  Eliseo Gum MSN, AGACNP-BC [place] for pager  06/29/2022, 10:20 AM

## 2022-06-30 DIAGNOSIS — J189 Pneumonia, unspecified organism: Secondary | ICD-10-CM | POA: Diagnosis not present

## 2022-06-30 DIAGNOSIS — J9601 Acute respiratory failure with hypoxia: Secondary | ICD-10-CM | POA: Diagnosis not present

## 2022-06-30 DIAGNOSIS — B348 Other viral infections of unspecified site: Secondary | ICD-10-CM | POA: Diagnosis not present

## 2022-06-30 LAB — BASIC METABOLIC PANEL
Anion gap: 12 (ref 5–15)
BUN: 51 mg/dL — ABNORMAL HIGH (ref 8–23)
CO2: 25 mmol/L (ref 22–32)
Calcium: 8 mg/dL — ABNORMAL LOW (ref 8.9–10.3)
Chloride: 102 mmol/L (ref 98–111)
Creatinine, Ser: 1.59 mg/dL — ABNORMAL HIGH (ref 0.61–1.24)
GFR, Estimated: 42 mL/min — ABNORMAL LOW (ref >60)
Glucose, Bld: 205 mg/dL — ABNORMAL HIGH (ref 70–99)
Potassium: 4.2 mmol/L (ref 3.5–5.1)
Sodium: 139 mmol/L (ref 135–145)

## 2022-06-30 NOTE — Progress Notes (Signed)
Progress Note  Patient: Ernest Parker DOB: 12/11/1933  DOA: 06/21/2022  DOS: 06/30/2022    Brief hospital course: Ernest Parker is an 88 y.o. male with a history of AFib, stage III CKD, HTN, HLD, SSS s/p PPM, prostate CA s/p XRT, T2DM, chronic indwelling foley, remote tobacco use, and GERD who presented to the ED with increasing shortness of breath in the setting of rhinorrhea, congestion and cough associated with diffuse weakness. He was found to be hypoxemic with multifocal pneumonia detailed on CXR. Subsequent virus panel positive for rhinovirus. Supportive care with antibiotics for possibility of superimposed CAP was administered. Diuretic IV given, then restarted home PO lasix. The patient's hypoxemia continued. CTA chest showed no PE, but there were extensive infiltrates. PCCM consulted, added steroids. On 10/16, hypoxemia has worsened. Pt is full code.   Assessment and Plan:  Acute hypoxic respiratory failure due to rhinovirus multifocal pneumonia: Worsening hypoxemia today with increased left-sided crackles by exam. This correlates to increased density of opacities on CXR.  - CTA chest did not show PE but there are extensive interstitial and alveolar infiltrates bilaterally   -Clinically patient appears to be improving daily although oxygen levels remain somewhat stable SpO2: 90 % O2 Flow Rate (L/min): 30 L/min FiO2 (%): 100 % - PCCM following, appreciate insight/recommendations - Echo bubble study negative for shunt.  -Continue steroids, tolerated diuresis well -previously increase steroids -DC amiodarone.  - Continue IS/flutter -Completed empiric antibiotic course (7 days)  Chronic HFpEF: LVEF 60-65%, G1DD, mod LAE - Held initially lasix for AKI, Cr improved, elevated LVEDP - Lasix IV completed - appears euvolemic, continue home dose 40 p.o. twice daily - Follow up aortic dilatation by echo in the outpatient setting.   Hypokalemia: Resolved with supplementation.   - Monitor.   Leukocytosis: Developed after steroids. PCT remains 0.15  AKI on stage IIIb CKD: Based on available values. - Stable CrCl for now.  PAF, sinus node dysfunction s/p PPM: In paced/NSR. - Continue amiodarone, cardizem and eliquis.   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 He is s/p left SFA stent placement 05/17/21 by Dr. Trula Slade - Continue ASA and crestor   NIDT2DM: Well controlled - HbA1c 5.7%.  - SSI   HLD:  - Continue statin  Hx prostate CA s/p XRT, chronic indwelling foley catheter - Foley catheter care   Elevated d-dimer: Negative LE venous U/S and CTA chest PE study.   Obesity: Estimated body mass index is 32.26 kg/m as calculated from the following:   Height as of this encounter: '5\' 9"'$  (1.753 m).   Weight as of this encounter: 99.1 kg.  Subjective: No acute issues or events overnight, respiratory status is stable, limited ambulation due to dyspnea with exertion but otherwise at rest patient feels much improved.   Objective: Vitals:   06/30/22 0015 06/30/22 0300 06/30/22 0329 06/30/22 0740  BP: (!) 140/59  (!) 145/64   Pulse: 60 60 60 60  Resp: '20 20 18 20  '$ Temp: 98.5 F (36.9 C)  97.8 F (36.6 C)   TempSrc: Oral  Oral   SpO2: 97% 91% 91% 90%  Weight:      Height:       Gen: 88 y.o. male in no distress Pulm: Right-sided rhonchi greater than left, coarse breath sounds bilaterally,  without rales CV: Regular rate and rhythm. No murmur, rub, or gallop. No JVD, no dependent edema. GI: Abdomen soft, non-tender, non-distended, with normoactive bowel sounds.  Ext: Warm, no deformities Skin: No rashes, lesions or ulcers on visualized skin. Neuro: Alert and oriented. No focal neurological deficits. Psych: Judgement and insight appear fair. Mood euthymic &  affect congruent. Behavior is appropriate.    Data Personally reviewed: CBC: Recent Labs  Lab 06/25/22 0514 06/28/22 0347  WBC 10.7* 18.0*  HGB 11.6* 11.5*  HCT 35.0* 35.1*  MCV 92.8 93.1  PLT 218 782    Basic Metabolic Panel: Recent Labs  Lab 06/25/22 0514 06/26/22 0445 06/28/22 0347 06/30/22 0351  NA 137 137 140 139  K 3.8 3.6 4.0 4.2  CL 106 105 103 102  CO2 '23 24 26 25  '$ GLUCOSE 136* 173* 186* 205*  BUN 21 24* 46* 51*  CREATININE 1.64* 1.50* 1.61* 1.59*  CALCIUM 8.0* 8.2* 9.1 8.0*    GFR: Estimated Creatinine Clearance: 38.7 mL/min (A) (by C-G formula based on SCr of 1.59 mg/dL (H)). Liver Function Tests: Recent Labs  Lab 06/28/22 0347  AST 16  ALT 16  ALKPHOS 80  BILITOT 0.7  PROT 5.8*  ALBUMIN 2.3*    Urine analysis:    Component Value Date/Time   COLORURINE YELLOW 05/18/2021 1114   APPEARANCEUR TURBID (A) 05/18/2021 1114   LABSPEC 1.010 05/18/2021 1114   PHURINE 8.5 (H) 05/18/2021 1114   GLUCOSEU NEGATIVE 05/18/2021 1114   HGBUR LARGE (A) 05/18/2021 1114   BILIRUBINUR NEGATIVE 05/18/2021 1114   KETONESUR NEGATIVE 05/18/2021 1114   PROTEINUR 100 (A) 05/18/2021 1114   NITRITE NEGATIVE 05/18/2021 1114   LEUKOCYTESUR MODERATE (A) 05/18/2021 1114   Recent Results (from the past 240 hour(s))  Resp Panel by RT-PCR (Flu A&B, Covid) Anterior Nasal Swab     Status: None   Collection Time: 06/21/22 12:24 PM   Specimen: Anterior Nasal Swab  Result Value Ref Range Status   SARS Coronavirus 2 by RT PCR NEGATIVE NEGATIVE Final    Comment: (NOTE) SARS-CoV-2 target nucleic acids are NOT DETECTED.  The SARS-CoV-2 RNA is generally detectable in upper respiratory specimens during the acute phase of infection. The lowest concentration of SARS-CoV-2 viral copies this assay can detect is 138 copies/mL. A negative result does not preclude SARS-Cov-2 infection and should not be used as the sole basis for treatment or other patient management decisions. A  negative result may occur with  improper specimen collection/handling, submission of specimen other than nasopharyngeal swab, presence of viral mutation(s) within the areas targeted by this assay, and inadequate number of viral copies(<138 copies/mL). A negative result must be combined with clinical observations, patient history, and epidemiological information. The expected result is Negative.  Fact Sheet for Patients:  EntrepreneurPulse.com.au  Fact Sheet for Healthcare Providers:  IncredibleEmployment.be  This test is no t yet approved or cleared by the [place] FDA and  has been authorized for detection and/or diagnosis of SARS-CoV-2 by FDA under an Emergency Use Authorization (EUA). This EUA will remain  in effect (meaning this test can be used) for the duration of the COVID-19 declaration under Section 564(b)(1) of the Act, 21 U.S.C.section 360bbb-3(b)(1), unless the authorization is terminated  or revoked sooner.       Influenza A by PCR NEGATIVE NEGATIVE Final   Influenza B by PCR NEGATIVE NEGATIVE Final    Comment: (NOTE) The Xpert Xpress SARS-CoV-2/FLU/RSV plus assay is intended as an aid in the diagnosis of  influenza from Nasopharyngeal swab specimens and should not be used as a sole basis for treatment. Nasal washings and aspirates are unacceptable for Xpert Xpress SARS-CoV-2/FLU/RSV testing.  Fact Sheet for Patients: EntrepreneurPulse.com.au  Fact Sheet for Healthcare Providers: IncredibleEmployment.be  This test is not yet approved or cleared by the [place] FDA and has been authorized for detection and/or diagnosis of SARS-CoV-2 by FDA under an Emergency Use Authorization (EUA). This EUA will remain in effect (meaning this test can be used) for the duration of the COVID-19 declaration under Section 564(b)(1) of the Act, 21 U.S.C. section 360bbb-3(b)(1), unless the authorization  is terminated or revoked.  Performed at [facility], [place] [address] [place], [place] [address]   Respiratory (~20 pathogens) panel by PCR     Status: Abnormal   Collection Time: 06/21/22  7:34 PM   Specimen: Nasopharyngeal Swab; Respiratory  Result Value Ref Range Status   Adenovirus NOT DETECTED NOT DETECTED Final   Coronavirus 229E NOT DETECTED NOT DETECTED Final    Comment: (NOTE) The Coronavirus on the Respiratory Panel, DOES NOT test for the novel  Coronavirus (2019 nCoV)    Coronavirus HKU1 NOT DETECTED NOT DETECTED Final   Coronavirus NL63 NOT DETECTED NOT DETECTED Final   Coronavirus OC43 NOT DETECTED NOT DETECTED Final   Metapneumovirus NOT DETECTED NOT DETECTED Final   Rhinovirus / Enterovirus DETECTED (A) NOT DETECTED Final   Influenza A NOT DETECTED NOT DETECTED Final   Influenza B NOT DETECTED NOT DETECTED Final   Parainfluenza Virus 1 NOT DETECTED NOT DETECTED Final   Parainfluenza Virus 2 NOT DETECTED NOT DETECTED Final   Parainfluenza Virus 3 NOT DETECTED NOT DETECTED Final   Parainfluenza Virus 4 NOT DETECTED NOT DETECTED Final   Respiratory Syncytial Virus NOT DETECTED NOT DETECTED Final   Bordetella pertussis NOT DETECTED NOT DETECTED Final   Bordetella Parapertussis NOT DETECTED NOT DETECTED Final   Chlamydophila pneumoniae NOT DETECTED NOT DETECTED Final   Mycoplasma pneumoniae NOT DETECTED NOT DETECTED Final    Comment: Performed at [facility], [place]. [address], [place], [place] [address]     DG Chest Port 1 View  Result Date: 06/29/2022 CLINICAL DATA:  Cough, respiratory failure EXAM: PORTABLE CHEST 1 VIEW COMPARISON:  06/26/2022 FINDINGS: Left-sided implanted cardiac device remains in place. Stable cardiomegaly. Extensive predominantly interstitial opacities throughout both lungs, slightly progressive compared to prior. No large pleural fluid collection. No pneumothorax. IMPRESSION: Slightly worsening bilateral airspace opacities.  Electronically Signed   By: Davina Poke D.O.   On: 06/29/2022 09:53    Family Communication:  Son at bedside  Disposition: Status is: Inpatient Remains inpatient appropriate because: Worsening hypoxic respiratory failure  Planned Discharge Destination: TBD  Holli Humbles DO 06/30/2022 8:24 AM Page by [email or web address]

## 2022-06-30 NOTE — TOC Initial Note (Signed)
Transition of Care [facility]) - Initial/Assessment Note    Patient Details  Name: Ernest Parker MRN: [id number] Date of Birth: August 30, 1934  [facility] CM/SW Contact:    Pollie Friar, RN Phone Number: 06/30/2022, 12:50 PM  Clinical Narrative:                 Pt continues to require HFNC. CM consulted for LTACH. CM spoke to patients daughter, Baker Janus and she is in agreement.  CM has sent referral to [facility] with [facility] per daughters request.  TOC following.  Expected Discharge Plan: Long Term Acute Care (LTAC) Barriers to Discharge: Continued Medical Work up   Patient Goals and CMS Choice   CMS Medicare.gov Compare Post Acute Care list provided to:: Patient Represenative (must comment) Choice offered to / list presented to : Adult Children  Expected Discharge Plan and Services Expected Discharge Plan: Long Term Acute Care (LTAC)   Discharge Planning Services: CM Consult Post Acute Care Choice: Long Term Acute Care (LTAC) Living arrangements for the past 2 months: Single Family Home                                      Prior Living Arrangements/Services Living arrangements for the past 2 months: Single Family Home Lives with:: Adult Children          Need for Family Participation in Patient Care: Yes (Comment) Care giver support system in place?: Yes (comment)      Activities of Daily Living Home Assistive Devices/Equipment: None ADL Screening (condition at time of admission) Patient's cognitive ability adequate to safely complete daily activities?: Yes Is the patient deaf or have difficulty hearing?: No Does the patient have difficulty seeing, even when wearing glasses/contacts?: No Does the patient have difficulty concentrating, remembering, or making decisions?: No Patient able to express need for assistance with ADLs?: Yes Does the patient have difficulty dressing or bathing?: No Independently performs ADLs?: Yes (appropriate for developmental  age) Does the patient have difficulty walking or climbing stairs?: No Weakness of Legs: None Weakness of Arms/Hands: None  Permission Sought/Granted                  Emotional Assessment           Psych Involvement: No (comment)  Admission diagnosis:  Hypoxia [R09.02] Acute respiratory failure with hypoxia (HCC) [J96.01] Acute cough [R05.1] Patient Active Problem List   Diagnosis Date Noted   Rhinovirus infection 06/28/2022   Viral pneumonia 06/28/2022   Hypokalemia 06/21/2022   Acute respiratory failure with hypoxia secondary to multifocal pneumonia  06/21/2022   Chronic indwelling Foley catheter 06/21/2022   Multifocal pneumonia 06/21/2022   Bladder outlet obstruction 10/03/2021   Claudication ([place]) 10/03/2021   Diabetic renal disease ([place]) 10/03/2021   Disorder of cardiovascular system 10/03/2021   Dyspnea 10/03/2021   Palpitations 10/03/2021   Pure hypercholesterolemia 10/03/2021   Type 2 diabetes mellitus with peripheral angiopathy ([place]) 10/03/2021   PAD (peripheral artery disease) ([place]) 05/17/2021   Pacemaker 05/03/2020   Urinary retention 03/10/2019   BPH (benign prostatic hyperplasia) 11/07/2018   GERD (gastroesophageal reflux disease) 11/07/2018   Mallory-Weiss syndrome    Cirrhosis of liver without ascites ([place])    E coli bacteremia    PAF (paroxysmal atrial fibrillation) ([place]) 07/04/2018   Elevated troponin 07/04/2018   Syncope and collapse 07/01/2018   Abnormal LFTs 07/01/2018   CKD stage 3b  vs. acute on CKD  07/01/2018   Sinus node dysfunction ([place]) 12/17/2017   TIA (transient ischemic attack) 11/12/2017   History of prostate cancer 11/12/2017   Atrial fibrillation with rapid ventricular response ([place])    Essential hypertension    PCP:  Seward Carol, MD Pharmacy:   [id number] [place], [place] [address]Phone: [phone]: [phone]    Social Determinants of Health  (SDOH) Interventions    Readmission Risk Interventions     No data to display

## 2022-06-30 NOTE — Progress Notes (Signed)
Physical Therapy Treatment Patient Details Name: Ernest Parker MRN: [id number] DOB: [birth year] Today's Date: 06/30/2022   History of Present Illness Pt is 88 y.o. male  who presented to ED 06/21/22 with complaints of shortness of breath. +pna and rhinovirus.  Requiring heated high flow O2;  PMH significant of atrial fibrillation, CKD stage 3, GERD, HLD, HTN, sinus node dysfunction s/p PPM, prostate cancer s/p radiation, T2DM, chronic indwelling foley    PT Comments    Pt required supervision transfer bed to recliner. LE exercises completed in recliner. Mobility limited by high O2 needs and quick desat with all mobility/activity. SpO2 90% at rest on 30L. Desat to 77% with transfer to recliner. Recovered to 85% after 4 minutes and LE exercises initiated. Desat to 79% during exercises, requiring 4 minute recovery time and increase to 35L for return to 87%. Able to maintain 87% on 30L at rest. Pt remained in recliner at end of session.    Recommendations for follow up therapy are one component of a multi-disciplinary discharge planning process, led by the attending physician.  Recommendations may be updated based on patient status, additional functional criteria and insurance authorization.  Follow Up Recommendations  No PT follow up     Assistance Recommended at Discharge Intermittent Supervision/Assistance  Patient can return home with the following Assistance with cooking/housework;Help with stairs or ramp for entrance   Equipment Recommendations  None recommended by PT    Recommendations for Other Services       Precautions / Restrictions Precautions Precautions: Other (comment) Precaution Comments: desats quickly, lengthy recovery time     Mobility  Bed Mobility Overal bed mobility: Independent                  Transfers Overall transfer level: Needs assistance Equipment used: None Transfers: Sit to/from Stand, Bed to chair/wheelchair/BSC Sit to Stand: Supervision    Step pivot transfers: Supervision       General transfer comment: supervision for lines    Ambulation/Gait                   Stairs             Wheelchair Mobility    Modified Rankin (Stroke Patients Only)       Balance Overall balance assessment: Needs assistance Sitting-balance support: No upper extremity supported, Feet supported Sitting balance-Leahy Scale: Normal     Standing balance support: No upper extremity supported, During functional activity Standing balance-Leahy Scale: Good                              Cognition Arousal/Alertness: Awake/alert Behavior During Therapy: WFL for tasks assessed/performed Overall Cognitive Status: Within Functional Limits for tasks assessed                                          Exercises General Exercises - Lower Extremity Ankle Circles/Pumps: AROM, Both, 10 reps, Seated Long Arc Quad: AROM, Right, Left, 10 reps, Seated Hip Flexion/Marching: AROM, Right, Left, 10 reps, Seated    General Comments General comments (skin integrity, edema, etc.): Pt on 30L HHFNC 100% FiO2. SpO2 90% at rest. Desat to 77% with transfer to recliner. 4 minute recovery time to 85%. LE exercises initiated and desat to 79%. O2 increased to 35L for recovery to 87% after 4 minutes. Returned to [employer]  with SpO2 maintained at 87%.      Pertinent Vitals/Pain Pain Assessment Pain Assessment: No/denies pain    Home Living                          Prior Function            PT Goals (current goals can now be found in the care plan section) Acute Rehab PT Goals Patient Stated Goal: home Progress towards PT goals: Not progressing toward goals - comment (continued high O2 needs)    Frequency    Min 3X/week      PT Plan Current plan remains appropriate    Co-evaluation              AM-PAC PT "6 Clicks" Mobility   Outcome Measure  Help needed turning from your back to your side  while in a flat bed without using bedrails?: None Help needed moving from lying on your back to sitting on the side of a flat bed without using bedrails?: None Help needed moving to and from a bed to a chair (including a wheelchair)?: A Little Help needed standing up from a chair using your arms (e.g., wheelchair or bedside chair)?: A Little Help needed to walk in hospital room?: A Little Help needed climbing 3-5 steps with a railing? : A Little 6 Click Score: 20    End of Session Equipment Utilized During Treatment: Oxygen Activity Tolerance: Treatment limited secondary to medical complications (Comment) (desat) Patient left: in chair;with call bell/phone within reach;with family/visitor present Nurse Communication: Mobility status PT Visit Diagnosis: Other abnormalities of gait and mobility (R26.89)     Time: 1130-1146 PT Time Calculation (min) (ACUTE ONLY): 16 min  Charges:  $Therapeutic Exercise: 8-22 mins                     Lorrin Goodell, PT  Office # [phone] Pager [phone]    Lorriane Shire 06/30/2022, 12:38 PM

## 2022-07-01 DIAGNOSIS — B348 Other viral infections of unspecified site: Secondary | ICD-10-CM | POA: Diagnosis not present

## 2022-07-01 DIAGNOSIS — J9601 Acute respiratory failure with hypoxia: Secondary | ICD-10-CM | POA: Diagnosis not present

## 2022-07-01 DIAGNOSIS — J189 Pneumonia, unspecified organism: Secondary | ICD-10-CM | POA: Diagnosis not present

## 2022-07-01 NOTE — Progress Notes (Signed)
Progress Note  Patient: Ernest Parker OVF:[id number] DOB: [birth year]  DOA: 06/21/2022  DOS: 07/01/2022    Brief hospital course: Ernest Parker is an 88 y.o. male with a history of AFib, stage III CKD, HTN, HLD, SSS s/p PPM, prostate CA s/p XRT, T2DM, chronic indwelling foley, remote tobacco use, and GERD who presented to the ED with increasing shortness of breath in the setting of rhinorrhea, congestion and cough associated with diffuse weakness. He was found to be hypoxemic with multifocal pneumonia detailed on CXR. Subsequent virus panel positive for rhinovirus. Supportive care with antibiotics for possibility of superimposed CAP was administered. Diuretic IV given, then restarted home PO lasix. The patient's hypoxemia continued. CTA chest showed no PE, but there were extensive infiltrates. PCCM consulted, added steroids. On 10/16, hypoxemia has worsened. Pt is full code.   Assessment and Plan:  Acute hypoxic respiratory failure due to rhinovirus multifocal pneumonia: Worsening hypoxemia today with increased left-sided crackles by exam. This correlates to increased density of opacities on CXR.  - CTA chest did not show PE but there are extensive interstitial and alveolar infiltrates bilaterally   -Clinically patient appears to be improving daily although oxygen levels remain somewhat stable SpO2: 90 % O2 Flow Rate (L/min): 30 L/min FiO2 (%): 90 % - PCCM following, appreciate insight/recommendations - Echo bubble study negative for shunt.  -Continue steroids, tolerated diuresis well -previously increase steroids -DC amiodarone.  - Continue IS/flutter -Completed empiric antibiotic course (7 days)  Chronic HFpEF: LVEF 60-65%, G1DD, mod LAE - Held initially lasix for AKI, Cr improved, elevated LVEDP - Lasix IV completed - appears euvolemic, continue home dose 40 p.o. twice daily - Follow up aortic dilatation by echo in the outpatient setting.   Hypokalemia: Resolved with supplementation.  -  Monitor.   Leukocytosis: Developed after steroids. PCT remains 0.15  AKI on stage IIIb CKD: Based on available values. - Stable CrCl for now.  PAF, sinus node dysfunction s/p PPM: In paced/NSR. - Continue amiodarone, cardizem and eliquis.   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 He is s/p left SFA stent placement 05/17/21 by Dr. Trula Slade - Continue ASA and crestor   NIDT2DM: Well controlled - HbA1c 5.7%.  - SSI   HLD:  - Continue statin  Hx prostate CA s/p XRT, chronic indwelling foley catheter - Foley catheter care   Elevated d-dimer: Negative LE venous U/S and CTA chest PE study.   Obesity: Estimated body mass index is 32.26 kg/m as calculated from the following:   Height as of this encounter: '5\' 9"'$  (1.753 m).   Weight as of this encounter: 99.1 kg.  Subjective: No acute issues or events overnight, respiratory status is stable, limited ambulation due to dyspnea with exertion but otherwise at rest patient feels much improved.   Objective: Vitals:   07/01/22 0848 07/01/22 0906 07/01/22 1139 07/01/22 1421  BP:   (!) 153/66   Pulse: 64  62 67  Resp: '19  18 18  '$ Temp:   (!) 97.4 F (36.3 C)   TempSrc:   Oral   SpO2: 91% 90% 91% 90%  Weight:      Height:       Gen: 88 y.o. male in no distress Pulm: Right-sided rhonchi greater than left, coarse breath sounds bilaterally, without rales CV:  Regular rate and rhythm. No murmur, rub, or gallop. No JVD, no dependent edema. GI: Abdomen soft, non-tender, non-distended, with normoactive bowel sounds.  Ext: Warm, no deformities Skin: No rashes, lesions or ulcers on visualized skin. Neuro: Alert and oriented. No focal neurological deficits. Psych: Judgement and insight appear fair. Mood euthymic & affect congruent. Behavior is  appropriate.    Data Personally reviewed: CBC: Recent Labs  Lab 06/25/22 0514 06/28/22 0347  WBC 10.7* 18.0*  HGB 11.6* 11.5*  HCT 35.0* 35.1*  MCV 92.8 93.1  PLT 218 275   Basic Metabolic Panel: Recent Labs  Lab 06/25/22 0514 06/26/22 0445 06/28/22 0347 06/30/22 0351  NA 137 137 140 139  K 3.8 3.6 4.0 4.2  CL 106 105 103 102  CO2 '23 24 26 25  '$ GLUCOSE 136* 173* 186* 205*  BUN 21 24* 46* 51*  CREATININE 1.64* 1.50* 1.61* 1.59*  CALCIUM 8.0* 8.2* 9.1 8.0*   GFR: Estimated Creatinine Clearance: 38.7 mL/min (A) (by C-G formula based on SCr of 1.59 mg/dL (H)). Liver Function Tests: Recent Labs  Lab 06/28/22 0347  AST 16  ALT 16  ALKPHOS 80  BILITOT 0.7  PROT 5.8*  ALBUMIN 2.3*   Urine analysis:    Component Value Date/Time   COLORURINE YELLOW 05/18/2021 1114   APPEARANCEUR TURBID (A) 05/18/2021 1114   LABSPEC 1.010 05/18/2021 1114   PHURINE 8.5 (H) 05/18/2021 1114   GLUCOSEU NEGATIVE 05/18/2021 1114   HGBUR LARGE (A) 05/18/2021 1114   BILIRUBINUR NEGATIVE 05/18/2021 1114   KETONESUR NEGATIVE 05/18/2021 1114   PROTEINUR 100 (A) 05/18/2021 1114   NITRITE NEGATIVE 05/18/2021 1114   LEUKOCYTESUR MODERATE (A) 05/18/2021 1114   Recent Results (from the past 240 hour(s))  Respiratory (~20 pathogens) panel by PCR     Status: Abnormal   Collection Time: 06/21/22  7:34 PM   Specimen: Nasopharyngeal Swab; Respiratory  Result Value Ref Range Status   Adenovirus NOT DETECTED NOT DETECTED Final   Coronavirus 229E NOT DETECTED NOT DETECTED Final    Comment: (NOTE) The Coronavirus on the Respiratory Panel, DOES NOT test for the novel  Coronavirus (2019 nCoV)    Coronavirus HKU1 NOT DETECTED NOT DETECTED Final   Coronavirus NL63 NOT DETECTED NOT DETECTED Final   Coronavirus OC43 NOT DETECTED NOT DETECTED Final   Metapneumovirus NOT DETECTED NOT DETECTED Final   Rhinovirus / Enterovirus DETECTED (A) NOT DETECTED Final   Influenza A NOT DETECTED NOT DETECTED Final    Influenza B NOT DETECTED NOT DETECTED Final   Parainfluenza Virus 1 NOT DETECTED NOT DETECTED Final   Parainfluenza Virus 2 NOT DETECTED NOT DETECTED Final   Parainfluenza Virus 3 NOT DETECTED NOT DETECTED Final   Parainfluenza Virus 4 NOT DETECTED NOT DETECTED Final   Respiratory Syncytial Virus NOT DETECTED NOT DETECTED Final   Bordetella pertussis NOT DETECTED NOT DETECTED Final   Bordetella Parapertussis NOT DETECTED NOT DETECTED Final   Chlamydophila pneumoniae NOT DETECTED NOT DETECTED Final   Mycoplasma pneumoniae NOT DETECTED NOT DETECTED Final    Comment: Performed at [facility], [address], [place], [place] [address]     No results found.  Family Communication:  Son at bedside  Disposition: Status is: Inpatient Remains inpatient appropriate because: Worsening hypoxic respiratory failure  Planned Discharge Destination: TBD  Holli Humbles DO 07/01/2022 2:44 PM Page by [email or web address]

## 2022-07-01 NOTE — Progress Notes (Signed)
Per MD [place]  Spo2 goal of 85 or greater with no symptoms and 80 or greater when moving.

## 2022-07-01 NOTE — Progress Notes (Signed)
Mobility Specialist: Progress Note   07/01/22 0957  Mobility  Activity Transferred from bed to chair  Level of Assistance Minimal assist, patient does 75% or more  Assistive Device None  Distance Ambulated (ft) 2 ft  Activity Response Tolerated well  Mobility Referral Yes  $Mobility charge 1 Mobility   Pre-Mobility 30L HHFNC: 62 HR, 89% SpO2 During Mobility 30L HHFNC: 64 HR, 77% SpO2 Post-Mobility 30L HHFNC: 62 HR, 89% SpO2  Pt received in the bed and agreeable to mobility. Pt required minA with bed mobility as well as to stand. After sitting EOB pt desat to 82% SpO2 requiring 3-4 minutes to recover. After transferring to the chair pt desat to 77% SpO2 requiring 5 minutes to recover back to 89% SpO2. Pt has call bell in reach. RN notified.   Young Harris Karter Hellmer Mobility Specialist Secure Chat Only

## 2022-07-02 DIAGNOSIS — J189 Pneumonia, unspecified organism: Secondary | ICD-10-CM | POA: Diagnosis not present

## 2022-07-02 DIAGNOSIS — B348 Other viral infections of unspecified site: Secondary | ICD-10-CM | POA: Diagnosis not present

## 2022-07-02 DIAGNOSIS — J9601 Acute respiratory failure with hypoxia: Secondary | ICD-10-CM | POA: Diagnosis not present

## 2022-07-02 NOTE — Progress Notes (Signed)
Progress Note  Patient: Ernest Parker KZS:[id number] DOB: [birth year]  DOA: 06/21/2022  DOS: 07/02/2022    Brief hospital course: Ernest Parker is an 88 y.o. male with a history of AFib, stage III CKD, HTN, HLD, SSS s/p PPM, prostate CA s/p XRT, T2DM, chronic indwelling foley, remote tobacco use, and GERD who presented to the ED with increasing shortness of breath in the setting of rhinorrhea, congestion and cough associated with diffuse weakness. He was found to be hypoxemic with multifocal pneumonia detailed on CXR. Subsequent virus panel positive for rhinovirus. Supportive care with antibiotics for possibility of superimposed CAP was administered. Diuretic IV given, then restarted home PO lasix. The patient's hypoxemia continued. CTA chest showed no PE, but there were extensive infiltrates. PCCM consulted, added steroids. On 10/16, hypoxemia has worsened. Pt is full code.   Assessment and Plan:  Acute hypoxic respiratory failure due to rhinovirus multifocal pneumonia: Worsening hypoxemia today with increased left-sided crackles by exam. This correlates to increased density of opacities on CXR.  - CTA chest did not show PE but there are extensive interstitial and alveolar infiltrates bilaterally   -Clinically patient appears to be improving daily although oxygen levels remain somewhat stable SpO2: (!) 88 % O2 Flow Rate (L/min): 30 L/min FiO2 (%): 100 % - Patient continues to be on 100% FIO2 overnight - likely component of sleep apnea - aggressively wean oxygen during day/while awake - 80% FIO2 today with sats in high 80s. - Echo bubble study negative for shunt.  -Continue steroids, tolerated diuresis well - amiodarone previously discontinued.  - Continue IS/flutter - Completed empiric antibiotic course (7 days)  Chronic HFpEF: LVEF 60-65%, G1DD, mod LAE - Held initially lasix for AKI, Cr improved, elevated LVEDP - Lasix IV completed - appears euvolemic, continue home dose 40 p.o. twice  daily - Follow up aortic dilatation by echo in the outpatient setting.   Hypokalemia: Resolved with supplementation.  - Monitor.   Leukocytosis: Developed after steroids. PCT remains 0.15  AKI on stage IIIb CKD: Based on available values. - Stable CrCl for now.  PAF, sinus node dysfunction s/p PPM: In paced/NSR. - Continue amiodarone, cardizem and eliquis.   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 He is s/p left SFA stent placement 05/17/21 by Dr. Trula Slade - Continue ASA and crestor   NIDT2DM: Well controlled - HbA1c 5.7%.  - SSI   HLD:  - Continue statin  Hx prostate CA s/p XRT, chronic indwelling foley catheter - Foley catheter care   Elevated d-dimer: Negative LE venous U/S and CTA chest PE study.   Obesity: Estimated body mass index is 32.26 kg/m as calculated from the following:   Height as of this encounter: '5\' 9"'$  (1.753 m).   Weight as of this encounter: 99.1 kg.  Subjective: No acute issues or events overnight, respiratory status is stable, limited ambulation due to dyspnea with exertion but otherwise at rest patient feels much improved.   Objective: Vitals:   07/02/22 0416 07/02/22 0720 07/02/22 0803 07/02/22 1116  BP: (!) 141/67  (!) 159/66 (!) 147/54  Pulse: 60 60 60 60  Resp: '18 18 16 19  '$ Temp:   97.6 F (36.4 C) 97.7 F (36.5 C)  TempSrc:   Oral Oral  SpO2: (!) 88% 91% 92% (!) 88%  Weight:      Height:       Gen: 88 y.o. male in no distress Pulm: Right-sided rhonchi greater than left, coarse breath sounds bilaterally, without rales CV: Regular rate and rhythm. No murmur, rub, or gallop. No JVD, no dependent edema. GI: Abdomen soft, non-tender, non-distended, with normoactive bowel sounds.  Ext: Warm, no deformities Skin: No rashes, lesions or ulcers on  visualized skin. Neuro: Alert and oriented. No focal neurological deficits. Psych: Judgement and insight appear fair. Mood euthymic & affect congruent. Behavior is appropriate.    Data Personally reviewed: CBC: Recent Labs  Lab 06/28/22 0347  WBC 18.0*  HGB 11.5*  HCT 35.1*  MCV 93.1  PLT 701   Basic Metabolic Panel: Recent Labs  Lab 06/26/22 0445 06/28/22 0347 06/30/22 0351  NA 137 140 139  K 3.6 4.0 4.2  CL 105 103 102  CO2 '24 26 25  '$ GLUCOSE 173* 186* 205*  BUN 24* 46* 51*  CREATININE 1.50* 1.61* 1.59*  CALCIUM 8.2* 9.1 8.0*   GFR: Estimated Creatinine Clearance: 38.7 mL/min (A) (by C-G formula based on SCr of 1.59 mg/dL (H)). Liver Function Tests: Recent Labs  Lab 06/28/22 0347  AST 16  ALT 16  ALKPHOS 80  BILITOT 0.7  PROT 5.8*  ALBUMIN 2.3*   Urine analysis:    Component Value Date/Time   COLORURINE YELLOW 05/18/2021 1114   APPEARANCEUR TURBID (A) 05/18/2021 1114   LABSPEC 1.010 05/18/2021 1114   PHURINE 8.5 (H) 05/18/2021 1114   GLUCOSEU NEGATIVE 05/18/2021 1114   HGBUR LARGE (A) 05/18/2021 1114   BILIRUBINUR NEGATIVE 05/18/2021 1114   KETONESUR NEGATIVE 05/18/2021 1114   PROTEINUR 100 (A) 05/18/2021 1114   NITRITE NEGATIVE 05/18/2021 1114   LEUKOCYTESUR MODERATE (A) 05/18/2021 1114   No results found for this or any previous visit (from the past 240 hour(s)).    No results found.  Family Communication:  Son at bedside  Disposition: Status is: Inpatient Remains inpatient appropriate because: Worsening hypoxic respiratory failure  Planned Discharge Destination: TBD  Holli Humbles DO 07/02/2022 1:06 PM Page by [email or web address]

## 2022-07-03 ENCOUNTER — Inpatient Hospital Stay (HOSPITAL_COMMUNITY): Admission: RE | Admit: 2022-07-03 | Discharge: 2022-07-12 | Disposition: E | Payer: Medicare Other

## 2022-07-03 DIAGNOSIS — I130 Hypertensive heart and chronic kidney disease with heart failure and stage 1 through stage 4 chronic kidney disease, or unspecified chronic kidney disease: Secondary | ICD-10-CM | POA: Diagnosis not present

## 2022-07-03 DIAGNOSIS — Z515 Encounter for palliative care: Secondary | ICD-10-CM | POA: Diagnosis not present

## 2022-07-03 DIAGNOSIS — J189 Pneumonia, unspecified organism: Secondary | ICD-10-CM | POA: Diagnosis not present

## 2022-07-03 DIAGNOSIS — J9621 Acute and chronic respiratory failure with hypoxia: Secondary | ICD-10-CM

## 2022-07-03 DIAGNOSIS — I5032 Chronic diastolic (congestive) heart failure: Secondary | ICD-10-CM

## 2022-07-03 DIAGNOSIS — M7731 Calcaneal spur, right foot: Secondary | ICD-10-CM | POA: Diagnosis not present

## 2022-07-03 DIAGNOSIS — M25771 Osteophyte, right ankle: Secondary | ICD-10-CM | POA: Diagnosis not present

## 2022-07-03 DIAGNOSIS — J9600 Acute respiratory failure, unspecified whether with hypoxia or hypercapnia: Secondary | ICD-10-CM | POA: Diagnosis not present

## 2022-07-03 DIAGNOSIS — R918 Other nonspecific abnormal finding of lung field: Secondary | ICD-10-CM | POA: Diagnosis not present

## 2022-07-03 DIAGNOSIS — J9601 Acute respiratory failure with hypoxia: Secondary | ICD-10-CM | POA: Diagnosis not present

## 2022-07-03 DIAGNOSIS — R52 Pain, unspecified: Secondary | ICD-10-CM | POA: Diagnosis not present

## 2022-07-03 DIAGNOSIS — Z66 Do not resuscitate: Secondary | ICD-10-CM | POA: Diagnosis not present

## 2022-07-03 DIAGNOSIS — E1122 Type 2 diabetes mellitus with diabetic chronic kidney disease: Secondary | ICD-10-CM | POA: Diagnosis not present

## 2022-07-03 DIAGNOSIS — E785 Hyperlipidemia, unspecified: Secondary | ICD-10-CM | POA: Diagnosis not present

## 2022-07-03 DIAGNOSIS — N1832 Chronic kidney disease, stage 3b: Secondary | ICD-10-CM | POA: Diagnosis not present

## 2022-07-03 DIAGNOSIS — I480 Paroxysmal atrial fibrillation: Secondary | ICD-10-CM | POA: Diagnosis not present

## 2022-07-03 DIAGNOSIS — Z950 Presence of cardiac pacemaker: Secondary | ICD-10-CM | POA: Diagnosis not present

## 2022-07-03 DIAGNOSIS — J9610 Chronic respiratory failure, unspecified whether with hypoxia or hypercapnia: Secondary | ICD-10-CM | POA: Diagnosis not present

## 2022-07-03 DIAGNOSIS — Z8546 Personal history of malignant neoplasm of prostate: Secondary | ICD-10-CM | POA: Diagnosis not present

## 2022-07-03 DIAGNOSIS — I10 Essential (primary) hypertension: Secondary | ICD-10-CM | POA: Diagnosis not present

## 2022-07-03 DIAGNOSIS — J9690 Respiratory failure, unspecified, unspecified whether with hypoxia or hypercapnia: Secondary | ICD-10-CM | POA: Diagnosis not present

## 2022-07-03 DIAGNOSIS — B9789 Other viral agents as the cause of diseases classified elsewhere: Secondary | ICD-10-CM | POA: Diagnosis not present

## 2022-07-03 DIAGNOSIS — J129 Viral pneumonia, unspecified: Secondary | ICD-10-CM

## 2022-07-03 DIAGNOSIS — K219 Gastro-esophageal reflux disease without esophagitis: Secondary | ICD-10-CM | POA: Diagnosis not present

## 2022-07-03 DIAGNOSIS — I4891 Unspecified atrial fibrillation: Secondary | ICD-10-CM | POA: Diagnosis not present

## 2022-07-03 LAB — BASIC METABOLIC PANEL
Anion gap: 8 (ref 5–15)
BUN: 53 mg/dL — ABNORMAL HIGH (ref 8–23)
CO2: 34 mmol/L — ABNORMAL HIGH (ref 22–32)
Calcium: 8.3 mg/dL — ABNORMAL LOW (ref 8.9–10.3)
Chloride: 98 mmol/L (ref 98–111)
Creatinine, Ser: 1.56 mg/dL — ABNORMAL HIGH (ref 0.61–1.24)
GFR, Estimated: 43 mL/min — ABNORMAL LOW (ref >60)
Glucose, Bld: 223 mg/dL — ABNORMAL HIGH (ref 70–99)
Potassium: 4.7 mmol/L (ref 3.5–5.1)
Sodium: 140 mmol/L (ref 135–145)

## 2022-07-03 LAB — GLUCOSE, CAPILLARY
Glucose-Capillary: 475 mg/dL — ABNORMAL HIGH (ref 70–99)
Glucose-Capillary: 464 mg/dL — ABNORMAL HIGH (ref 70–99)
Glucose-Capillary: 420 mg/dL — ABNORMAL HIGH (ref 70–99)
Glucose-Capillary: 457 mg/dL — ABNORMAL HIGH (ref 70–99)
Glucose-Capillary: 284 mg/dL — ABNORMAL HIGH (ref 70–99)

## 2022-07-03 LAB — GLUCOSE, RANDOM: Glucose, Bld: 451 mg/dL — ABNORMAL HIGH (ref 70–99)

## 2022-07-03 MED ORDER — INSULIN ASPART 100 UNIT/ML IJ SOLN
10.0000 [IU] | Freq: Once | INTRAMUSCULAR | Status: AC
  Administered 2022-07-03: 10 [IU] via SUBCUTANEOUS

## 2022-07-03 MED ORDER — GUAIFENESIN ER 600 MG PO TB12: 600.0000 mg | ORAL_TABLET | Freq: Two times a day (BID) | ORAL | 0 refills | Status: AC

## 2022-07-03 MED ORDER — DEXTROMETHORPHAN POLISTIREX ER 30 MG/5ML PO SUER: 30.0000 mg | Freq: Two times a day (BID) | ORAL | 0 refills | Status: AC | PRN

## 2022-07-03 MED ORDER — INSULIN ASPART 100 UNIT/ML IJ SOLN
14.0000 [IU] | Freq: Once | INTRAMUSCULAR | Status: AC
  Administered 2022-07-03: 14 [IU] via SUBCUTANEOUS

## 2022-07-03 MED ORDER — FLUTICASONE FUROATE-VILANTEROL 100-25 MCG/ACT IN AEPB: 1.0000 | INHALATION_SPRAY | Freq: Every day | RESPIRATORY_TRACT | 0 refills | Status: AC

## 2022-07-03 MED ORDER — PREDNISONE 10 MG PO TABS: ORAL_TABLET | ORAL | 0 refills | Status: AC

## 2022-07-03 MED ORDER — INSULIN ASPART 100 UNIT/ML IJ SOLN: 0.0000 [IU] | Freq: Three times a day (TID) | INTRAMUSCULAR | 0 refills | Status: AC

## 2022-07-03 MED ORDER — LEVALBUTEROL HCL 0.63 MG/3ML IN NEBU: 0.6300 mg | INHALATION_SOLUTION | Freq: Four times a day (QID) | RESPIRATORY_TRACT | 12 refills | Status: AC | PRN

## 2022-07-03 MED ORDER — MONTELUKAST SODIUM 10 MG PO TABS: 10.0000 mg | ORAL_TABLET | Freq: Every day | ORAL | 0 refills | Status: AC

## 2022-07-03 MED ORDER — INSULIN ASPART 100 UNIT/ML IJ SOLN
0.0000 [IU] | Freq: Three times a day (TID) | INTRAMUSCULAR | Status: DC
  Administered 2022-07-03: 5 [IU] via SUBCUTANEOUS

## 2022-07-03 NOTE — TOC Transition Note (Signed)
Transition of Care [facility]) - CM/SW Discharge Note   Patient Details  Name: Ernest Parker MRN: [id number] Date of Birth: [birth year]  Transition of Care [facility]) CM/SW Contact:  Pollie Friar, RN Phone Number: 07/09/2022, 1:54 PM   Clinical Narrative:    Pt has been accepted to [facility] today. Anderson Malta with [facility] to call and update the patients daughter.  Pt will transport via bed to unit on 5th floor. Room [phone]   Final next level of care: Long Term Acute Care (LTAC) Barriers to Discharge: No Barriers Identified   Patient Goals and CMS Choice   CMS Medicare.gov Compare Post Acute Care list provided to:: Patient Represenative (must comment) Choice offered to / list presented to : Adult Children  Discharge Placement                       Discharge Plan and Services   Discharge Planning Services: CM Consult Post Acute Care Choice: Long Term Acute Care (LTAC)                               Social Determinants of Health (SDOH) Interventions     Readmission Risk Interventions     No data to display

## 2022-07-03 NOTE — Progress Notes (Signed)
NAME:  Ernest Parker, MRN:  [id number], DOB:  03/09/1934, LOS: 67 ADMISSION DATE:  06/21/2022, CONSULTATION DATE:  06/25/22 REFERRING MD:  Bonner Puna , CHIEF COMPLAINT:  SOB   History of Present Illness:  88 yo M PMH Afib on amio, CKD III, HTN, SSS s/p PPM, remote tobacco use admitted to [facility] 10/11 for SOB and weakness after first presenting to PCP where he was hypoxic with SpO2 70 on RA. He c/o a dry cough which did not improve with tessalon pearls, and progressive weakness. Did have some URI sx. No sick contacts.He was found to have multifocal airspace disease and positive for rhinovirus. He was hypoxic and required 4LNC.  Azithromycin and Rocephin were added 10/12 for possible superimposed bacterial infection. IV lasix was added 10/13.   O2 needs increased a bit in the coming days. On 10/15, a CTA chest was acquired to r/o PE. There was no evidence of PE but did reveal extensive GGOs and bilateral infiltrates.   PCCM is consulted in this setting.   Normally can do about 15-16 golf holes with cart before getting fatigued. 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  Pertinent  Medical History  Afib Prior tobacco use (30 pack yr hx, quit 40 years ago)  Barrett's esophagus GERD HLD HTN Prostate cancer s/p XRT SSS s/p PPM CKD III PAD Obesity   Significant Hospital Events: Including procedures, antibiotic start and stop dates in addition to other pertinent events   10/11 hypoxic at PCP to 66s. Presented to ED. Admit to [facility] for rhinovirus PNA, hypoxia 10/12 azithro, rocephin added 10/13 Started IV diuresis  10/15 CTA chest no PE, extensive GGOs and bilateral infiltrate. Pulm consult  10/16 steroid dose increased 10/17: Remains on high flow nasal cannula 80%, 25 L 10/23 Remains on HFNC at 80%, 25 L,  transferring to [facility] today  Interim History / Subjective:   Feeling better today  Difficult to hear over sound of HFNC   On FiO2 80% 25 L/min No CXR since 10/19, no CBC since 10/18 For transfer to [facility] today.  Objective   Blood pressure (!) 157/59, pulse 64, temperature 98.3 F (36.8 C), temperature source Axillary, resp. rate 20, height '5\' 9"'$  (1.753 m), weight 99.1 kg, SpO2 90 %.    FiO2 (%):  [80 %] 80 %   Intake/Output Summary (Last 24 hours) at 06/28/2022 1423 Last data filed at 07/02/2022 1006 Gross per 24 hour  Intake --  Output 2200 ml  Net -2200 ml   Filed Weights   06/24/22 0304  Weight: 99.1 kg    Examination:  General:  WDWN older adult M HEENT NCAT pink mm HHFNC  Pulmonary:  Symmetrical chest expansion. Unlabored on HHFNC, sats are 90% Cardiac: rrr s1s2  Abdomen:  sfot ndnt , Body mass index is 32.26 kg/m. Extremities: no acute joint deformity, trace edema Skin: c/d/w   Resolved Hospital Problem list     Assessment & Plan:     Acute respiratory failure with hypoxia Rhinovirus PNA Possible superimposed organizing PNA  Leukocytosis -presentation not very c/w amio lung tox, but this has been dc.  He was started on steroids 10/15, and completed 5d abx CAP coverage -PCT on 10/16 was 0.15. his white count has bumped from 10.7 (10/15) to 18 (10/18) likely due to steroids  -  CXR 10/19 with increased bilateral interstitial opacities. ? Component of edema contributing here vs developing a fibrotic process. Looking at prior wonder if Feb 2023 cxr was showing early fibrotic changes.  Plan -Cont solumedrol, PPI in this setting -Cont holding amio  -Lasix  -PRN CXR  -bronchodilators -IS, mobility   Pt. Is for transfer to [facility] today. ( 06/25/2022) Please consider follow up with Pulmonary as OP once he is discharged home  Best Practice (right click and "Reselect all SmartList Selections" daily)  Per primary  Magdalen Spatz, MSN,  AGACNP-BC [place] for personal pager PCCM on call pager [phone] Amion for pager  06/13/2022, 2:23 PM

## 2022-07-03 NOTE — Discharge Summary (Signed)
Physician Discharge Summary  Ernest Parker:[id number] DOB: 1934/08/29 DOA: 06/21/2022  PCP: Seward Carol, MD  Admit date: 06/21/2022 Discharge date: 07/04/2022  Admitted From: Home Disposition:  LTAC  Discharge Condition:Stable  CODE STATUS:Full  Diet recommendation: Low salt low fat low carb diet   Brief/Interim Summary: Ernest Parker is an 88 y.o. male with a history of AFib, stage III CKD, HTN, HLD, SSS s/p PPM, prostate CA s/p XRT, T2DM, chronic indwelling foley, remote tobacco use, and GERD who presented to the ED with increasing shortness of breath in the setting of rhinorrhea, congestion and cough associated with diffuse weakness. He was found to be hypoxemic with multifocal pneumonia detailed on CXR. Subsequent virus panel positive for rhinovirus. Supportive care with antibiotics for possibility of superimposed CAP was administered. Diuretic IV given, then restarted home PO lasix. The patient's hypoxemia continued. CTA chest showed no PE, but there were extensive infiltrates. PCCM consulted, added steroids. On 10/16, hypoxemia has worsened.  Patient admitted as above with RSV pneumonia with likely superimposed bacterial pneumonia with profound hypoxia.  Patient at this time is stabilizing if not minimally improved over the past 48 hours.  Patient is currently stable and agreeable for discharge to [facility] facility for ongoing weaning of his oxygen.  Otherwise continue to progress with PT and OT limited by his hypoxia but improving daily.  Of note patient has had moderate hyperglycemia in the setting of steroid use, continue sliding scale insulin at discharge with full expectation to wean off over the next few days as steroids taper.  No history of diabetes on chart review.   Discharge Diagnoses:  Principal Problem:   Acute respiratory failure with hypoxia secondary to multifocal pneumonia  Active Problems:   Hypokalemia   CKD stage 3b vs. acute on CKD    PAF (paroxysmal atrial  fibrillation) (HCC)   Essential hypertension   PAD (peripheral artery disease) (HCC)   Type 2 diabetes mellitus with peripheral angiopathy (HCC)   Chronic indwelling Foley catheter   Multifocal pneumonia   Rhinovirus infection   Viral pneumonia   Acute hypoxic respiratory failure due to rhinovirus multifocal pneumonia: Worsening hypoxemia today with increased left-sided crackles by exam. This correlates to increased density of opacities on CXR.  - CTA chest did not show PE but there are extensive interstitial and alveolar infiltrates bilaterally   -Clinically patient appears to be improving daily although oxygen levels remain somewhat stable SpO2: 90 % O2 Flow Rate (L/min): 25 L/min FiO2 (%): 80 % - Patient continues to be on 100% FIO2 overnight - likely component of sleep apnea - aggressively wean oxygen during day/while awake - 80% FIO2 today with sats in high 80s. - Echo bubble study negative for shunt.  -Continue steroids, tolerated diuresis well - amiodarone previously discontinued.  - Continue IS/flutter - Completed empiric antibiotic course (7 days)   Chronic HFpEF: LVEF 60-65%, G1DD, mod LAE - Held initially lasix for AKI, Cr improved, elevated LVEDP - Lasix IV completed - appears euvolemic, continue home dose 40 p.o. twice daily - Follow up aortic dilatation by echo in the outpatient setting.    Hypokalemia: Resolved with supplementation.  - Monitor.    Leukocytosis: Developed after steroids. PCT remains 0.15   AKI on stage IIIb CKD: Based on available values. - Stable CrCl for now.   PAF, sinus node dysfunction s/p PPM: Paced/NSR. - Continue cardizem and eliquis.  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 He is s/p left SFA stent placement 05/17/21 by Dr. Trula Slade -  Continue ASA and crestor    NIDT2DM: Well controlled - HbA1c 5.7% previously - SSI to cover for transient hyperglycemia - POC likely to improve as steroids taper down   HLD:  - Continue statin   Hx prostate CA s/p XRT, chronic indwelling foley catheter - Foley catheter care -exchange every 2 weeks, due to exchange today on 06/19/2022   Elevated d-dimer: Negative LE venous U/S and CTA chest PE study.    Obesity: Estimated body mass index is 32.26 kg/m as calculated from the following:   Height as of this encounter: '5\' 9"'$  (1.753 m).   Weight as of this encounter: 99.1 kg.  Discharge Instructions   Allergies as of 07/05/2022       Reactions   Penicillins Other (See Comments)   Patient can't remember reaction was told by mother he had a SERIOUS allergic rxn as a child  DID THE REACTION INVOLVE: Swelling of the face/tongue/throat, SOB, or low BP? Unknown Sudden or severe rash/hives, skin peeling, or the inside of the mouth or nose? Unknown Did it require medical treatment? Unknown When did it last happen?      Childhood allergy  If all above answers are "NO", may proceed with cephalosporin use.        Medication List     STOP taking these medications    amiodarone 200 MG tablet Commonly known as: PACERONE       TAKE these medications    ASPIRIN 81 PO Take 81 mg by mouth daily.   benzonatate 100 MG capsule Commonly known as: TESSALON Take 100 mg by mouth 3 (three) times daily.   dextromethorphan 30 MG/5ML liquid Commonly known as: DELSYM Take 5 mLs (30 mg total) by mouth 2 (two) times daily as needed for cough.   diltiazem 240 MG 24 hr capsule Commonly known as: CARDIZEM CD TAKE 1 CAPSULE BY MOUTH EVERY DAY What changed: how much to take   Eliquis 2.5 MG Tabs tablet Generic drug: apixaban TAKE 1 TABLET BY MOUTH TWICE A DAY What changed: how much to take   fluticasone furoate-vilanterol 100-25 MCG/ACT Aepb Commonly known as: BREO ELLIPTA Inhale 1 puff  into the lungs daily. Start taking on: July 04, 2022   furosemide 40 MG tablet Commonly known as: LASIX TAKE 1 TABLET BY MOUTH TWICE A DAY What changed: when to take this   guaiFENesin 600 MG 12 hr tablet Commonly known as: MUCINEX Take 1 tablet (600 mg total) by mouth 2 (two) times daily.   insulin aspart 100 UNIT/ML injection Commonly known as: novoLOG Inject 0-9 Units into the skin 3 (three) times daily with meals.   levalbuterol 0.63 MG/3ML nebulizer solution Commonly known as: XOPENEX Take 3 mLs (0.63 mg total) by nebulization every 6 (six) hours as needed for wheezing or shortness of breath.   montelukast 10 MG tablet Commonly known as: SINGULAIR Take 1 tablet (10 mg total) by mouth at bedtime.   predniSONE 10 MG tablet Commonly known as: DELTASONE Take 4 tablets (40 mg total) by mouth daily for 3 days, THEN 3 tablets (30 mg total) daily for 3 days, THEN 2 tablets (20 mg total) daily for 3 days, THEN 1 tablet (10 mg total) daily for 3 days. Start taking on: July 03, 2022  REFRESH OP Place 1 drop into both eyes 2 (two) times daily as needed (dry eyes).   rosuvastatin 10 MG tablet Commonly known as: CRESTOR Take 10 mg by mouth daily.   Vitamin D3 50 MCG (2000 UT) Tabs Take 2,000 Units by mouth daily.        Allergies  Allergen Reactions   Penicillins Other (See Comments)    Patient can't remember reaction was told by mother he had a SERIOUS allergic rxn as a child  DID THE REACTION INVOLVE: Swelling of the face/tongue/throat, SOB, or low BP? Unknown Sudden or severe rash/hives, skin peeling, or the inside of the mouth or nose? Unknown Did it require medical treatment? Unknown When did it last happen?      Childhood allergy  If all above answers are "NO", may proceed with cephalosporin use.     Consultations: Pulmonology   Procedures/Studies: DG Chest Port 1 View  Result Date: 06/29/2022 CLINICAL DATA:  Cough, respiratory failure EXAM: PORTABLE  CHEST 1 VIEW COMPARISON:  06/26/2022 FINDINGS: Left-sided implanted cardiac device remains in place. Stable cardiomegaly. Extensive predominantly interstitial opacities throughout both lungs, slightly progressive compared to prior. No large pleural fluid collection. No pneumothorax. IMPRESSION: Slightly worsening bilateral airspace opacities. Electronically Signed   By: Davina Poke D.O.   On: 06/29/2022 09:53   ECHOCARDIOGRAM LIMITED BUBBLE STUDY  Result Date: 06/27/2022    ECHOCARDIOGRAM LIMITED REPORT   Patient Name:   Ernest Parker Date of Exam: 06/27/2022 Medical Rec #:  [id number]     Height:       69.0 in Accession #:    [id number]    Weight:       218.5 lb Date of Birth:  [birth year]     BSA:          2.145 m Patient Age:    75 years      BP:           163/71 mmHg Patient Gender: M             HR:           60 bpm. Exam Location:  Inpatient Procedure: Limited Echo and Saline Contrast Bubble Study Indications:    ASD  History:        Patient has prior history of Echocardiogram examinations, most                 recent 06/22/2022.  Sonographer:    Harvie Junior Referring Phys: [id number] [employer]  1. Limited echo to r/o ASD  2. Left ventricular ejection fraction, by estimation, is 60 to 65%. The left ventricle has normal function. There is mild left ventricular hypertrophy.  3. Right ventricular systolic function is low normal. The right ventricular size is mildly enlarged.  4. Agitated saline contrast bubble study was negative, with no evidence of any interatrial shunt. Comparison(s): Changes from prior study are noted. 06/22/2022: LVEF 60-65%, normal RV function. FINDINGS  Left Ventricle: Left ventricular ejection fraction, by estimation, is 60 to 65%. The left ventricle has normal function. There is mild left ventricular hypertrophy. Right Ventricle: The right ventricular size is mildly enlarged. No increase in right ventricular wall thickness. Right ventricular systolic function is low  normal. IAS/Shunts: No atrial level shunt detected by color flow Doppler. Agitated saline contrast was given intravenously to evaluate for intracardiac shunting. Agitated saline contrast bubble study was negative, with no evidence of any interatrial shunt. There  is no evidence of an atrial septal  defect. LEFT VENTRICLE PLAX 2D LVIDd:         5.30 cm LVIDs:         3.80 cm LV PW:         1.10 cm LV IVS:        1.10 cm  LEFT ATRIUM         Index LA diam:    4.70 cm 2.19 cm/m Lyman Bishop MD Electronically signed by Lyman Bishop MD Signature Date/Time: 06/27/2022/12:27:59 PM    Final    DG Chest Port 1 View  Result Date: 06/26/2022 CLINICAL DATA:  57846 ARDS (adult respiratory distress syndrome) ([place]) [id number] EXAM: PORTABLE CHEST 1 VIEW COMPARISON:  Radiograph 06/22/2022, CT 06/25/2022 FINDINGS: With the unchanged large cardiac silhouette with dual chamber pacemaker leads. Persistent multifocal airspace disease bilaterally, similar to recent chest CT, increased in the left upper lobe in comparison prior radiograph on 10/12. No large effusion. No evidence of pneumothorax. No acute osseous abnormality. Thoracic spondylosis. IMPRESSION: Persistent multifocal airspace disease bilaterally, compatible with multifocal pneumonia and/or edema. Electronically Signed   By: Maurine Simmering M.D.   On: 06/26/2022 10:06   VAS [place] LOWER EXTREMITY VENOUS (DVT)  Result Date: 06/25/2022  Lower Venous DVT Study Patient Name:  Ernest Parker  Date of Exam:   06/25/2022 Medical Rec #: [id number]      Accession #:    [id number] Date of Birth: 1934-01-02      Patient Gender: M Patient Age:   75 years Exam Location:  [facility] Procedure:      VAS [place] LOWER EXTREMITY VENOUS (DVT) Referring Phys: Vance Gather --------------------------------------------------------------------------------  Indications: Edema, SOB, and Elevated D-Dimer, pneumonia, rhinovirus, atrial fibrillation.  Comparison Study: No prior study on file Performing  Technologist: Sharion Dove RVS  Examination Guidelines: A complete evaluation includes B-mode imaging, spectral Doppler, color Doppler, and power Doppler as needed of all accessible portions of each vessel. Bilateral testing is considered an integral part of a complete examination. Limited examinations for reoccurring indications may be performed as noted. The reflux portion of the exam is performed with the patient in reverse Trendelenburg.  +---------+---------------+---------+-----------+----------+--------------+ RIGHT    CompressibilityPhasicitySpontaneityPropertiesThrombus Aging +---------+---------------+---------+-----------+----------+--------------+ CFV      Full           Yes      Yes                                 +---------+---------------+---------+-----------+----------+--------------+ SFJ      Full                                                        +---------+---------------+---------+-----------+----------+--------------+ FV Prox  Full                                                        +---------+---------------+---------+-----------+----------+--------------+ FV Mid   Full                                                        +---------+---------------+---------+-----------+----------+--------------+  FV DistalFull                                                        +---------+---------------+---------+-----------+----------+--------------+ PFV      Full                                                        +---------+---------------+---------+-----------+----------+--------------+ POP      Full           Yes      Yes                                 +---------+---------------+---------+-----------+----------+--------------+ PTV      Full                                                        +---------+---------------+---------+-----------+----------+--------------+ PERO     Full                                                         +---------+---------------+---------+-----------+----------+--------------+   +---------+---------------+---------+-----------+----------+--------------+ LEFT     CompressibilityPhasicitySpontaneityPropertiesThrombus Aging +---------+---------------+---------+-----------+----------+--------------+ CFV      Full           Yes      Yes                                 +---------+---------------+---------+-----------+----------+--------------+ SFJ      Full                                                        +---------+---------------+---------+-----------+----------+--------------+ FV Prox  Full                                                        +---------+---------------+---------+-----------+----------+--------------+ FV Mid   Full                                                        +---------+---------------+---------+-----------+----------+--------------+ FV DistalFull                                                        +---------+---------------+---------+-----------+----------+--------------+  PFV      Full                                                        +---------+---------------+---------+-----------+----------+--------------+ POP      Full           Yes      Yes                                 +---------+---------------+---------+-----------+----------+--------------+ PTV      Full                                                        +---------+---------------+---------+-----------+----------+--------------+ PERO     Full                                                        +---------+---------------+---------+-----------+----------+--------------+     Summary: BILATERAL: - No evidence of deep vein thrombosis seen in the lower extremities, bilaterally. -No evidence of popliteal cyst, bilaterally.   *See table(s) above for measurements and observations. Electronically signed by Monica Martinez MD on  06/25/2022 at 11:51:38 AM.    Final    CT Angio Chest Pulmonary Embolism (PE) W or WO Contrast  Result Date: 06/25/2022 CLINICAL DATA:  Positive D-dimer, left shoulder pain EXAM: CT ANGIOGRAPHY CHEST WITH CONTRAST TECHNIQUE: Multidetector CT imaging of the chest was performed using the standard protocol during bolus administration of intravenous contrast. Multiplanar CT image reconstructions and MIPs were obtained to evaluate the vascular anatomy. RADIATION DOSE REDUCTION: This exam was performed according to the departmental dose-optimization program which includes automated exposure control, adjustment of the mA and/or kV according to patient size and/or use of iterative reconstruction technique. CONTRAST:  33m OMNIPAQUE IOHEXOL 350 MG/ML SOLN COMPARISON:  Previous studies including the chest radiographs done on 06/22/2022 FINDINGS: Cardiovascular: Significant atherosclerotic changes are noted in thoracic aorta with calcifications and plaques. There are no intraluminal filling defects in central pulmonary artery branches. Evaluation of small peripheral branches is limited by motion artifacts and infiltrates. Coronary artery calcifications are seen. Pacemaker battery is seen in left infraclavicular region. Mediastinum/Nodes: There are enlarged lymph nodes in mediastinum measuring up to 1.5 cm in maximum diameter. Subcentimeter nodes are seen in both hilar regions. Lungs/Pleura: Extensive ground-glass and alveolar infiltrates in both lungs. Findings are more prominent in the upper and mid lung fields. Minimal right pleural effusion is seen. There is no pneumothorax. Upper Abdomen: Air is noted in the bile ducts suggesting previous sphincterotomy. Surgical clips are seen in gallbladder fossa. There is 1.6 cm fluid density lesion in the liver suggesting possible hepatic cyst. Small hiatal hernia is seen. Musculoskeletal: No acute findings are seen. Review of the MIP images confirms the above findings.  IMPRESSION: There is no evidence of central pulmonary artery embolism. Evaluation of small peripheral branches is limited by motion artifacts and infiltrates. Arteriosclerotic changes are noted in thoracic aorta. Coronary artery  calcifications are seen. Extensive patchy infiltrates are seen in both lungs suggesting multifocal pneumonia. Possibility of underlying interstitial edema and underlying scarring is not excluded. There is minimal right pleural effusion. Small hiatal hernia. Pneumobilia suggests possible previous sphincterotomy. Electronically Signed   By: Elmer Picker M.D.   On: 06/25/2022 10:48   ECHOCARDIOGRAM COMPLETE  Result Date: 06/22/2022    ECHOCARDIOGRAM REPORT   Patient Name:   Ernest Parker Date of Exam: 06/22/2022 Medical Rec #:  [id number]     Height:       70.0 in Accession #:    [id number]    Weight:       215.0 lb Date of Birth:  1934/06/24     BSA:          2.152 m Patient Age:    21 years      BP:           128/65 mmHg Patient Gender: M             HR:           77 bpm. Exam Location:  Inpatient Procedure: 2D Echo, Cardiac Doppler and Color Doppler Indications:    Acutre respiratory distress R06.03                 Cardiomyopathy-Unspecified I42.9                 Heart block, RBBB I45.10  History:        Patient has prior history of Echocardiogram examinations, most                 recent 07/12/2020. Pacemaker, TIA and PAD, Arrythmias:Atrial                 Fibrillation, Signs/Symptoms:Syncope, Dyspnea and Shortness of                 Breath; Risk Factors:Hypertension and Diabetes.  Sonographer:    Ronny Flurry Referring Phys: [place]  1. Left ventricular ejection fraction, by estimation, is 60 to 65%. The left ventricle has normal function. The left ventricle has no regional wall motion abnormalities. There is mild concentric left ventricular hypertrophy. Left ventricular diastolic parameters are consistent with Grade I diastolic dysfunction (impaired  relaxation). Elevated left ventricular end-diastolic pressure.  2. Right ventricular systolic function is normal. The right ventricular size is normal.  3. Left atrial size was moderately dilated.  4. The mitral valve is normal in structure. Mild mitral valve regurgitation. No evidence of mitral stenosis.  5. The aortic valve is tricuspid. Aortic valve regurgitation is not visualized. No aortic stenosis is present.  6. Aortic dilatation noted. There is mild dilatation of the aortic root, measuring 38 mm. There is moderate dilatation of the ascending aorta, measuring 43 mm.  7. The inferior vena cava is normal in size with greater than 50% respiratory variability, suggesting right atrial pressure of 3 mmHg. FINDINGS  Left Ventricle: Left ventricular ejection fraction, by estimation, is 60 to 65%. The left ventricle has normal function. The left ventricle has no regional wall motion abnormalities. The left ventricular internal cavity size was normal in size. There is  mild concentric left ventricular hypertrophy. Left ventricular diastolic parameters are consistent with Grade I diastolic dysfunction (impaired relaxation). Elevated left ventricular end-diastolic pressure. Right Ventricle: The right ventricular size is normal. No increase in right ventricular wall thickness. Right ventricular systolic function is normal. Left Atrium: Left atrial size was moderately dilated. Right  Atrium: Right atrial size was normal in size. Pericardium: There is no evidence of pericardial effusion. Mitral Valve: The mitral valve is normal in structure. Mild mitral valve regurgitation. No evidence of mitral valve stenosis. Tricuspid Valve: The tricuspid valve is normal in structure. Tricuspid valve regurgitation is trivial. No evidence of tricuspid stenosis. Aortic Valve: The aortic valve is tricuspid. Aortic valve regurgitation is not visualized. No aortic stenosis is present. Aortic valve mean gradient measures 4.0 mmHg. Aortic valve  peak gradient measures 7.2 mmHg. Aortic valve area, by VTI measures 2.75 cm. Pulmonic Valve: The pulmonic valve was normal in structure. Pulmonic valve regurgitation is not visualized. No evidence of pulmonic stenosis. Aorta: Aortic dilatation noted. There is mild dilatation of the aortic root, measuring 38 mm. There is moderate dilatation of the ascending aorta, measuring 43 mm. Venous: The inferior vena cava is normal in size with greater than 50% respiratory variability, suggesting right atrial pressure of 3 mmHg. IAS/Shunts: No atrial level shunt detected by color flow Doppler.  LEFT VENTRICLE PLAX 2D LVIDd:         5.20 cm   Diastology LVIDs:         3.80 cm   LV e' medial:    4.87 cm/s LV PW:         1.30 cm   LV E/e' medial:  15.5 LV IVS:        1.20 cm   LV e' lateral:   8.83 cm/s LVOT diam:     2.10 cm   LV E/e' lateral: 8.6 LV SV:         87 LV SV Index:   40 LVOT Area:     3.46 cm  RIGHT VENTRICLE RV S prime:     9.32 cm/s TAPSE (M-mode): 1.6 cm LEFT ATRIUM             Index        RIGHT ATRIUM           Index LA diam:        5.10 cm 2.37 cm/m   RA Area:     17.00 cm LA Vol (A2C):   73.6 ml 34.19 ml/m  RA Volume:   38.30 ml  17.79 ml/m LA Vol (A4C):   87.8 ml 40.79 ml/m LA Biplane Vol: 81.4 ml 37.82 ml/m  AORTIC VALVE AV Area (Vmax):    2.69 cm AV Area (Vmean):   2.72 cm AV Area (VTI):     2.75 cm AV Vmax:           134.00 cm/s AV Vmean:          92.100 cm/s AV VTI:            0.316 m AV Peak Grad:      7.2 mmHg AV Mean Grad:      4.0 mmHg LVOT Vmax:         104.00 cm/s LVOT Vmean:        72.300 cm/s LVOT VTI:          0.251 m LVOT/AV VTI ratio: 0.79  AORTA Ao Root diam: 3.80 cm Ao Asc diam:  4.30 cm MITRAL VALVE                TRICUSPID VALVE MV Area (PHT): 2.48 cm     TR Peak grad:   34.8 mmHg MV Decel Time: 306 msec     TR Vmax:        295.00 cm/s MV E velocity:  75.70 cm/s MV A velocity: 101.00 cm/s  SHUNTS MV E/A ratio:  0.75         Systemic VTI:  0.25 m                              Systemic Diam: 2.10 cm Skeet Latch MD Electronically signed by Skeet Latch MD Signature Date/Time: 06/22/2022/7:40:53 PM    Final    DG Chest 2 View  Result Date: 06/22/2022 CLINICAL DATA:  Shortness of breath EXAM: CHEST - 2 VIEW COMPARISON:  Yesterday FINDINGS: Lateral view is moderately motion degraded, essentially nondiagnostic. Pacer with leads at right atrium and right ventricle. No lead discontinuity. Midline trachea. Mild cardiomegaly. No pleural effusion or pneumothorax. Peripheral predominant interstitial thickening is similar to yesterday but increased from 10/17/2021. No well-defined lobar consolidation. IMPRESSION: No significant change since 1 day prior. Peripheral predominant interstitial thickening likely represents mild interstitial edema. Atypical infection could look similar. Electronically Signed   By: Abigail Miyamoto M.D.   On: 06/22/2022 08:19   DG Chest Portable 1 View  Result Date: 06/21/2022 CLINICAL DATA:  Shortness of breath EXAM: PORTABLE CHEST 1 VIEW COMPARISON:  Previous studies including the examination of 10/17/2021 FINDINGS: Transverse diameter of heart is increased. There is significant interval increase in interstitial markings scattered throughout both lungs. Left lateral CP angle is indistinct. There is no pneumothorax. Pacemaker battery is seen in the left infraclavicular region. IMPRESSION: There is significant interval increase in interstitial markings in both lungs, more so in the right upper lung field. Findings suggest multifocal interstitial pneumonia or interstitial edema. Part of this finding suggests underlying scarring. Electronically Signed   By: Elmer Picker M.D.   On: 06/21/2022 14:55     Subjective: No acute issues or events overnight denies nausea vomiting diarrhea constipation any fevers chills or chest pain   Discharge Exam: Vitals:   07/02/2022 1013 06/27/2022 1205  BP: (!) 156/63 (!) 157/59  Pulse: 62 64  Resp:  20  Temp:  98.3  F (36.8 C)  SpO2: 90%    Vitals:   06/16/2022 0752 06/24/2022 1011 06/16/2022 1013 06/15/2022 1205  BP:  (!) 156/63 (!) 156/63 (!) 157/59  Pulse: 60  62 64  Resp: 18   20  Temp:    98.3 F (36.8 C)  TempSrc:    Axillary  SpO2:   90%   Weight:      Height:        General: Pt is alert, awake, not in acute distress Cardiovascular: RRR, S1/S2 +, no rubs, no gallops Respiratory: CTA bilaterally, no wheezing, no rhonchi Abdominal: Soft, NT, ND, bowel sounds + Extremities: no edema, no cyanosis    The results of significant diagnostics from this hospitalization (including imaging, microbiology, ancillary and laboratory) are listed below for reference.     Microbiology: No results found for this or any previous visit (from the past 240 hour(s)).   Labs: BNP (last 3 results) Recent Labs    06/22/22 0430  BNP 836.6*   Basic Metabolic Panel: Recent Labs  Lab 06/28/22 0347 06/30/22 0351 06/22/2022 0435  NA 140 139 140  K 4.0 4.2 4.7  CL 103 102 98  CO2 26 25 34*  GLUCOSE 186* 205* 223*  BUN 46* 51* 53*  CREATININE 1.61* 1.59* 1.56*  CALCIUM 9.1 8.0* 8.3*   Liver Function Tests: Recent Labs  Lab 06/28/22 0347  AST 16  ALT 16  ALKPHOS 80  BILITOT 0.7  PROT 5.8*  ALBUMIN 2.3*   No results for input(s): "LIPASE", "AMYLASE" in the last 168 hours. No results for input(s): "AMMONIA" in the last 168 hours. CBC: Recent Labs  Lab 06/28/22 0347  WBC 18.0*  HGB 11.5*  HCT 35.1*  MCV 93.1  PLT 317   Cardiac Enzymes: No results for input(s): "CKTOTAL", "CKMB", "CKMBINDEX", "TROPONINI" in the last 168 hours. BNP: Invalid input(s): "POCBNP" CBG: Recent Labs  Lab 06/25/2022 1305  GLUCAP 475*   D-Dimer No results for input(s): "DDIMER" in the last 72 hours. Hgb A1c No results for input(s): "HGBA1C" in the last 72 hours. Lipid Profile No results for input(s): "CHOL", "HDL", "LDLCALC", "TRIG", "CHOLHDL", "LDLDIRECT" in the last 72 hours. Thyroid function studies No  results for input(s): "TSH", "T4TOTAL", "T3FREE", "THYROIDAB" in the last 72 hours.  Invalid input(s): "FREET3" Anemia work up No results for input(s): "VITAMINB12", "FOLATE", "FERRITIN", "TIBC", "IRON", "RETICCTPCT" in the last 72 hours. Urinalysis    Component Value Date/Time   COLORURINE YELLOW 05/18/2021 1114   APPEARANCEUR TURBID (A) 05/18/2021 1114   LABSPEC 1.010 05/18/2021 1114   PHURINE 8.5 (H) 05/18/2021 1114   GLUCOSEU NEGATIVE 05/18/2021 1114   HGBUR LARGE (A) 05/18/2021 1114   BILIRUBINUR NEGATIVE 05/18/2021 1114   KETONESUR NEGATIVE 05/18/2021 1114   PROTEINUR 100 (A) 05/18/2021 1114   NITRITE NEGATIVE 05/18/2021 1114   LEUKOCYTESUR MODERATE (A) 05/18/2021 1114   Sepsis Labs Recent Labs  Lab 06/28/22 0347  WBC 18.0*   Microbiology No results found for this or any previous visit (from the past 240 hour(s)).   Time coordinating discharge: Over 30 minutes  SIGNED:   Little Ishikawa, DO [facility] 06/20/2022, 1:51 PM Pager   If 7PM-7AM, please contact night-coverage [email or web address]

## 2022-07-03 NOTE — Progress Notes (Signed)
Physical Therapy Treatment Patient Details Name: Ernest Parker MRN: [id number] DOB: [birth year] Today's Date: 06/26/2022   History of Present Illness Pt is 88 y.o. male  who presented to ED 06/21/22 with complaints of shortness of breath. +pna and rhinovirus.  Requiring heated high flow O2;  PMH significant of atrial fibrillation, CKD stage 3, GERD, HLD, HTN, sinus node dysfunction s/p PPM, prostate cancer s/p radiation, T2DM, chronic indwelling foley    PT Comments    Pt remains alert, oriented, and eager to mobilize. Pt min guard for transfers however continues to demonstrate poor activity tolerance. With transfer to chair pt desat to 68% on 20L HHFNC requiring 4 min to recover to 85%. Pt able to completed seated LE HEP and attempted marching in place for 90 sec with use of RW to aide in energy conservation. Acute PT to cont to follow.    Recommendations for follow up therapy are one component of a multi-disciplinary discharge planning process, led by the attending physician.  Recommendations may be updated based on patient status, additional functional criteria and insurance authorization.  Follow Up Recommendations  No PT follow up (may need cardiac/pulmonary rehab)     Assistance Recommended at Discharge Intermittent Supervision/Assistance  Patient can return home with the following Assistance with cooking/housework;Help with stairs or ramp for entrance   Equipment Recommendations  None recommended by PT    Recommendations for Other Services       Precautions / Restrictions Precautions Precautions: Other (comment) Precaution Comments: desats quickly, lengthy recovery time Restrictions Weight Bearing Restrictions: No     Mobility  Bed Mobility Overal bed mobility: Independent             General bed mobility comments: HOB elevated, brings self to bed    Transfers Overall transfer level: Needs assistance Equipment used: None Transfers: Sit to/from Stand, Bed to  chair/wheelchair/BSC Sit to Stand: Min guard   Step pivot transfers: Min guard       General transfer comment: min guard to steady, line mangement    Ambulation/Gait               General Gait Details: marched in place, 20L HFNC, dropped to 68% s/p 90 sec of marching, required 4 min to recover to 85%   Chief Strategy Officer    Modified Rankin (Stroke Patients Only)       Balance Overall balance assessment: Needs assistance Sitting-balance support: No upper extremity supported, Feet supported Sitting balance-Leahy Scale: Normal     Standing balance support: During functional activity, Bilateral upper extremity supported Standing balance-Leahy Scale: Good Standing balance comment: used RW for safe marching in place                            Cognition Arousal/Alertness: Awake/alert Behavior During Therapy: WFL for tasks assessed/performed Overall Cognitive Status: Within Functional Limits for tasks assessed                                 General Comments: pt able to follow all cues and answer all questions appropriately        Exercises General Exercises - Lower Extremity Ankle Circles/Pumps: AROM, Both, 10 reps, Seated Long Arc Quad: AROM, Right, Left, 10 reps, Seated Hip Flexion/Marching: AROM, Right, Left, 10 reps, Seated    General Comments General  comments (skin integrity, edema, etc.): Pt on 20L HHFNC, noted DOE, SPO2 dec to 68% during transfer to chair and then marching in place x90sec, took 4 min to recover to 85%      Pertinent Vitals/Pain Pain Assessment Pain Assessment: No/denies pain Faces Pain Scale: No hurt    Home Living                          Prior Function            PT Goals (current goals can now be found in the care plan section) Acute Rehab PT Goals PT Goal Formulation: With patient Time For Goal Achievement: 2022/07/10 Potential to Achieve Goals: Good Progress  towards PT goals: Progressing toward goals    Frequency    Min 3X/week      PT Plan Current plan remains appropriate    Co-evaluation              AM-PAC PT "6 Clicks" Mobility   Outcome Measure  Help needed turning from your back to your side while in a flat bed without using bedrails?: None Help needed moving from lying on your back to sitting on the side of a flat bed without using bedrails?: None Help needed moving to and from a bed to a chair (including a wheelchair)?: A Little Help needed standing up from a chair using your arms (e.g., wheelchair or bedside chair)?: A Little Help needed to walk in hospital room?: A Little Help needed climbing 3-5 steps with a railing? : A Little 6 Click Score: 20    End of Session Equipment Utilized During Treatment: Oxygen Activity Tolerance: Treatment limited secondary to medical complications (Comment) (desat) Patient left: in chair;with call bell/phone within reach;with family/visitor present Nurse Communication: Mobility status PT Visit Diagnosis: Other abnormalities of gait and mobility (R26.89)     Time: 5093-2671 PT Time Calculation (min) (ACUTE ONLY): 21 min  Charges:  $Therapeutic Activity: 8-22 mins                     Kittie Plater, PT, DPT Acute Rehabilitation Services Secure chat preferred Office #: [phone]    Berline Lopes 06/29/2022, 2:05 PM

## 2022-07-03 NOTE — Progress Notes (Signed)
Patient noted to have CBG   of 475. Patient asymptomatic . Dr. Avon Gully notified new orders received .

## 2022-07-03 NOTE — Care Management Important Message (Signed)
Important Message  Patient Details  Name: Ernest Parker MRN: [id number] Date of Birth: Oct 02, 1933   Medicare Important Message Given:  Yes     [name] Montine Circle 06/18/2022, 3:53 PM

## 2022-07-04 ENCOUNTER — Other Ambulatory Visit (HOSPITAL_COMMUNITY): Payer: Medicare Other

## 2022-07-04 DIAGNOSIS — J9621 Acute and chronic respiratory failure with hypoxia: Secondary | ICD-10-CM

## 2022-07-04 DIAGNOSIS — I480 Paroxysmal atrial fibrillation: Secondary | ICD-10-CM | POA: Diagnosis not present

## 2022-07-04 DIAGNOSIS — N1832 Chronic kidney disease, stage 3b: Secondary | ICD-10-CM

## 2022-07-04 DIAGNOSIS — I5032 Chronic diastolic (congestive) heart failure: Secondary | ICD-10-CM

## 2022-07-04 DIAGNOSIS — J129 Viral pneumonia, unspecified: Secondary | ICD-10-CM

## 2022-07-04 LAB — CBC
HCT: 35.3 % — ABNORMAL LOW (ref 39.0–52.0)
Hemoglobin: 11.6 g/dL — ABNORMAL LOW (ref 13.0–17.0)
MCH: 30.4 pg (ref 26.0–34.0)
MCHC: 32.9 g/dL (ref 30.0–36.0)
MCV: 92.7 fL (ref 80.0–100.0)
Platelets: 252 10*3/uL (ref 150–400)
RBC: 3.81 MIL/uL — ABNORMAL LOW (ref 4.22–5.81)
RDW: 14.6 % (ref 11.5–15.5)
WBC: 18.3 10*3/uL — ABNORMAL HIGH (ref 4.0–10.5)
nRBC: 0 % (ref 0.0–0.2)

## 2022-07-04 LAB — BASIC METABOLIC PANEL
Anion gap: 10 (ref 5–15)
BUN: 55 mg/dL — ABNORMAL HIGH (ref 8–23)
CO2: 28 mmol/L (ref 22–32)
Calcium: 8 mg/dL — ABNORMAL LOW (ref 8.9–10.3)
Chloride: 101 mmol/L (ref 98–111)
Creatinine, Ser: 1.45 mg/dL — ABNORMAL HIGH (ref 0.61–1.24)
GFR, Estimated: 47 mL/min — ABNORMAL LOW (ref >60)
Glucose, Bld: 159 mg/dL — ABNORMAL HIGH (ref 70–99)
Potassium: 4 mmol/L (ref 3.5–5.1)
Sodium: 139 mmol/L (ref 135–145)

## 2022-07-04 LAB — BLOOD GAS, ARTERIAL
Acid-Base Excess: 9.4 mmol/L — ABNORMAL HIGH (ref 0.0–2.0)
Bicarbonate: 34.3 mmol/L — ABNORMAL HIGH (ref 20.0–28.0)
O2 Saturation: 96.9 %
Patient temperature: 37
pCO2 arterial: 46 mmHg (ref 32–48)
pH, Arterial: 7.48 — ABNORMAL HIGH (ref 7.35–7.45)
pO2, Arterial: 82 mmHg — ABNORMAL LOW (ref 83–108)

## 2022-07-04 LAB — LACTIC ACID, PLASMA: Lactic Acid, Venous: 2.1 mmol/L (ref 0.5–1.9)

## 2022-07-04 NOTE — Consult Note (Signed)
Pulmonary Critical Care Medicine [place]  PULMONARY SERVICE  Date of Service: 07/04/2022  PULMONARY CRITICAL CARE CONSULT   Ernest Parker  MWN:[id number]  DOB: [birth year]   DOA: 06/13/2022  Referring Physician: Satira Sark, MD  HPI: Ernest Parker is a 88 y.o. male seen for follow up of Acute on Chronic Respiratory Failure.  Patient has multiple medical problems including atrial fibrillation stage III chronic kidney disease hypertension hyperlipidemia prostate cancer type 2 diabetes with a history of chronic indwelling Foley GERD who came into the hospital because of increasing shortness of breath.  Patient at that time was found to be severely hypoxic and then chest x-ray was done which revealed multifocal pneumonia.  The patient apparently was positive for rhinovirus.  Was treated with oxygen therapy also had a CT scan which was unremarkable for pulmonary embolism other than showing diffuse extensive infiltrates.  The main issue has been getting him weaned off of oxygen and presents now to our facility for further management currently is on 90% FiO2 on heated high flow.  I did briefly speak with the patient and discussed possibility of intubation mechanical ventilation and I do believe that he wants to have a conversation with his daughter.  Its not imminent however I think it is a good idea to have goals of care set up prior to any major issues coming up.  Review of Systems:  ROS performed and is unremarkable other than noted above.  Past Medical History:  Diagnosis Date   A-fib ([place])    Barrett's esophagus    Dizzy resolved   Dyspnea    with exertion occ   GERD (gastroesophageal reflux disease)    Hard of hearing    wears hearing aids bilat   High cholesterol    Hypertension    Presence of permanent cardiac pacemaker 12/17/2017   Prostate cancer ([place]) 2002   S/P "radiation for 8-9 weeks"   SOB (shortness of breath)     Past Surgical History:   Procedure Laterality Date   ABDOMINAL AORTOGRAM W/LOWER EXTREMITY Bilateral 05/17/2021   Procedure: ABDOMINAL AORTOGRAM W/LOWER EXTREMITY;  Surgeon: Serafina Mitchell, MD;  Location: [facility];  Service: Cardiovascular;  Laterality: Bilateral;   BALLOON DILATION N/A 09/05/2018   Procedure: BALLOON DILATION;  Surgeon: Milus Banister, MD;  Location: [facility] ENDOS[facility];  Service: Endoscopy;  Laterality: N/A;   BILIARY STENT PLACEMENT  07/09/2018   Procedure: BILIARY STENT PLACEMENT;  Surgeon: Milus Banister, MD;  Location: [facility] ENDOSCOPY;  Service: Endoscopy;;   BILIARY STENT PLACEMENT N/A 09/05/2018   Procedure: BILIARY STENT PLACEMENT;  Surgeon: Milus Banister, MD;  Location: [facility] ENDOSCOPY;  Service: Endoscopy;  Laterality: N/A;   BIOPSY  07/05/2018   Procedure: BIOPSY;  Surgeon: Rush Landmark Telford Nab., MD;  Location: [facility] ENDOSCOPY;  Service: Gastroenterology;;   CHOLECYSTECTOMY     2020 late april or early may   ENDOSCOPIC RETROGRADE CHOLANGIOPANCREATOGRAPHY (ERCP) WITH PROPOFOL N/A 09/05/2018   Procedure: ENDOSCOPIC RETROGRADE CHOLANGIOPANCREATOGRAPHY (ERCP) WITH PROPOFOL;  Surgeon: Milus Banister, MD;  Location: [facility] ENDOS[facility];  Service: Endoscopy;  Laterality: N/A;   ERCP N/A 07/09/2018   Procedure: ENDOSCOPIC RETROGRADE CHOLANGIOPANCREATOGRAPHY (ERCP);  Surgeon: Milus Banister, MD;  Location: [facility] ENDOSCOPY;  Service: Endoscopy;  Laterality: N/A;   ESOPHAGOGASTRODUODENOSCOPY (EGD) WITH PROPOFOL N/A 07/05/2018   Procedure: ESOPHAGOGASTRODUODENOSCOPY (EGD) WITH PROPOFOL;  Surgeon: Rush Landmark Telford Nab., MD;  Location: [place];  Service: Gastroenterology;  Laterality: N/A;   EUS  07/05/2018  Procedure: FULL UPPER ENDOSCOPIC ULTRASOUND (EUS) RADIAL;  Surgeon: Rush Landmark Telford Nab., MD;  Location: [place];  Service: Gastroenterology;;   INGUINAL HERNIA REPAIR Bilateral    INSERT / REPLACE / [place]  12/17/2017   IR CHOLANGIOGRAM EXISTING TUBE  09/10/2018   IR  EXCHANGE BILIARY DRAIN  08/14/2018   IR EXCHANGE BILIARY DRAIN  10/08/2018   IR PERC CHOLECYSTOSTOMY  07/10/2018   PACEMAKER IMPLANT N/A 12/17/2017   Procedure: PACEMAKER IMPLANT;  Surgeon: Evans Lance, MD;  Location: [place] [facility];  Service: Cardiovascular;  Laterality: N/A;   PERIPHERAL VASCULAR INTERVENTION Left 05/17/2021   Procedure: PERIPHERAL VASCULAR INTERVENTION;  Surgeon: Serafina Mitchell, MD;  Location: [place] [facility];  Service: Cardiovascular;  Laterality: Left;  superficial femoral   PROSTATE BIOPSY     REMOVAL OF STONES  07/09/2018   Procedure: REMOVAL OF STONES;  Surgeon: Milus Banister, MD;  Location: [facility] ENDOSCOPY;  Service: Endoscopy;;   SPHINCTEROTOMY  07/09/2018   Procedure: [name];  Surgeon: Milus Banister, MD;  [facility] ENDOSCOPY;  Service: Endoscopy;;   SPHINCTEROTOMY  09/05/2018   Procedure: [name];  Surgeon: Milus Banister, MD;  Location: [facility] ENDOSCOPY;  Service: Endoscopy;;   STENT REMOVAL  09/05/2018   Procedure: STENT REMOVAL;  Surgeon: Milus Banister, MD;  Location: [facility] ENDOSCOPY;  Service: Endoscopy;;   STONE EXTRACTION WITH BASKET  09/05/2018   Procedure: STONE EXTRACTION WITH BASKET;  Surgeon: Milus Banister, MD;  Location: [facility] ENDOSCOPY;  Service: Endoscopy;;   THORACIC AORTIC ANEURYSM REPAIR  03/2018   TRANSURETHRAL RESECTION OF PROSTATE N/A 03/10/2019   Procedure: TRANSURETHRAL RESECTION OF THE PROSTATE (TURP);  Surgeon: Lucas Mallow, MD;  Location: [facility]S;  Service: Urology;  Laterality: N/A;    Social History:    reports that he quit smoking about 43 years ago. His smoking use included cigarettes. He has a 15.00 pack-year smoking history. He has never used smokeless tobacco. He reports that he does not currently use alcohol after a past usage of about 1.0 standard drink of alcohol per week. He reports that he does not use drugs.  Family History: Non-Contributory to the present illness  Allergies  Allergen  Reactions   Penicillins Other (See Comments)    Patient can't remember reaction was told by mother he had a SERIOUS allergic rxn as a child  DID THE REACTION INVOLVE: Swelling of the face/tongue/throat, SOB, or low BP? Unknown Sudden or severe rash/hives, skin peeling, or the inside of the mouth or nose? Unknown Did it require medical treatment? Unknown When did it last happen?      Childhood allergy  If all above answers are "NO", may proceed with cephalosporin use.     Medications: Reviewed on Rounds  Physical Exam:  Vitals: Temperature is 98 saturations 94% heart rate was 110  Ventilator Settings off the ventilator on 90% heated high flow  General: Comfortable at this time Eyes: Grossly normal lids, irises & conjunctiva ENT: grossly tongue is normal Neck: no obvious mass Cardiovascular: S1-S2 normal no gallop Respiratory: Coarse rhonchi are noted bilaterally Abdomen: Soft and nontender Skin: no rash seen on limited exam Musculoskeletal: not rigid Psychiatric:unable to assess Neurologic: no seizure no involuntary movements         Labs on Admission:  Basic Metabolic Panel: Recent Labs  Lab 06/28/22 0347 06/30/22 0351 06/24/2022 0435 06/30/2022 1352 07/04/22 0224  NA 140 139 140  --  139  K 4.0 4.2 4.7  --  4.0  CL 103 102 98  --  101  CO2 26 25 34*  --  28  GLUCOSE 186* 205* 223* 451* 159*  BUN 46* 51* 53*  --  55*  CREATININE 1.61* 1.59* 1.56*  --  1.45*  CALCIUM 9.1 8.0* 8.3*  --  8.0*    Recent Labs  Lab 07/04/22 0613  PHART 7.48*  PCO2ART 46  PO2ART 82*  HCO3 34.3*  O2SAT 96.9    Liver Function Tests: Recent Labs  Lab 06/28/22 0347  AST 16  ALT 16  ALKPHOS 80  BILITOT 0.7  PROT 5.8*  ALBUMIN 2.3*   No results for input(s): "LIPASE", "AMYLASE" in the last 168 hours. No results for input(s): "AMMONIA" in the last 168 hours.  CBC: Recent Labs  Lab 06/28/22 0347 07/04/22 0224  WBC 18.0* 18.3*  HGB 11.5* 11.6*  HCT 35.1* 35.3*  MCV 93.1  92.7  PLT 317 252    Cardiac Enzymes: No results for input(s): "CKTOTAL", "CKMB", "CKMBINDEX", "TROPONINI" in the last 168 hours.  BNP (last 3 results) Recent Labs    06/22/22 0430  BNP 199.6*    ProBNP (last 3 results) No results for input(s): "PROBNP" in the last 8760 hours.   Radiological Exams on Admission: No results found.  Assessment/Plan Active Problems:   Acute on chronic respiratory failure with hypoxia (HCC)   Chronic kidney disease, stage 3b (HCC)   Paroxysmal atrial fibrillation (HCC)   Pneumonia, viral   Chronic heart failure with preserved ejection fraction (HFpEF) (HCC)   Acute on chronic respiratory failure with hypoxia patient has severe hypoxemia secondary to damage from RSV pneumonia.  Patient's last CT was personally reviewed by me shows diffuse groundglass opacities not improving.  Would recommend doing follow-up follow radiological evaluation to see if there is any worsening.  Also I would try to keep on the dry side he does have a history of atrial fibrillation and cardiac issues. Chronic heart failure preserved ejection fraction we will continue with supportive care monitor fluid status closely diurese as tolerated. Paroxysmal atrial fibrillation currently rate is controlled we will continue to follow along patient has a pacemaker in place as evidenced by the pacer spikes on telemetry Viral pneumonia secondary to rhinovirus with residual severe pulmonary damage.  The CT scan that was done last showed almost fibrotic changes we are going to need to monitor these closely and follow-up with them for evolution.  Patient is also on steroids would suggest slowly tapering steroids down Stage IIIb chronic kidney disease we will continue with supportive care monitor fluid status it will be a fine balance between diuretics for heart failure fluid overload and renal function.  I have personally seen and evaluated the patient, evaluated laboratory and imaging results,  formulated the assessment and plan and placed orders. The Patient requires high complexity decision making with multiple systems involvement.  Case was discussed on Rounds with the Respiratory Therapy Director and the Respiratory staff Time Spent 76mnutes  Skyelyn Scruggs A Parker Wherley, MD FUniversity Medical CenterPulmonary Critical Care Medicine Sleep Medicine

## 2022-07-05 NOTE — Progress Notes (Cosign Needed)
Pulmonary [place]   PULMONARY CRITICAL CARE SERVICE  PROGRESS NOTE     Ernest Parker  [id number]:[id number]  DOB: July 25, 1934   DOA: 06/14/2022  Referring Physician: Satira Sark, MD  HPI: Ernest Parker is a 88 y.o. male being followed for ventilator/airway/oxygen weaning Acute on Chronic Respiratory Failure.  Patient seen sitting up in bed, son at bedside.  Patient currently on heated high flow 90% and 45 L.  Overnight he was noted to be desatting while moving to the chair, it took a significant amount of time for him to get back above 80%.  In addition to heated high flow he also required nonrebreather being placed.  We did have an in-depth conversation with patient, nurse practitioner, son and myself about severity of disease shown on CT.  We discussed prognosis and asked the patient and patient's family to make a decision over the next day or so if intubation would be considered an option if the patient continues to deteriorate.  Also explained to them that this is not eminent however we need to address this issue prior to it becoming an emergency.  Medications: Reviewed on Rounds  Physical Exam:  Vitals: Temp 98.0, pulse 75, respirations 30, BP 145/85, SPO2 90%  Ventilator Settings heated high flow 90% and 45 L  General: Comfortable at this time Neck: supple Cardiovascular: no malignant arrhythmias Respiratory: Bilaterally diminished Skin: no rash seen on limited exam Musculoskeletal: No gross abnormality Psychiatric:unable to assess Neurologic:no involuntary movements         Lab Data:   Basic Metabolic Panel: Recent Labs  Lab 06/30/22 0351 06/30/2022 0435 06/18/2022 1352 07/04/22 0224  NA 139 140  --  139  K 4.2 4.7  --  4.0  CL 102 98  --  101  CO2 25 34*  --  28  GLUCOSE 205* 223* 451* 159*  BUN 51* 53*  --  55*  CREATININE 1.59* 1.56*  --  1.45*  CALCIUM 8.0* 8.3*  --  8.0*    ABG: Recent Labs  Lab 07/04/22 0613   PHART 7.48*  PCO2ART 46  PO2ART 82*  HCO3 34.3*  O2SAT 96.9    Liver Function Tests: No results for input(s): "AST", "ALT", "ALKPHOS", "BILITOT", "PROT", "ALBUMIN" in the last 168 hours. No results for input(s): "LIPASE", "AMYLASE" in the last 168 hours. No results for input(s): "AMMONIA" in the last 168 hours.  CBC: Recent Labs  Lab 07/04/22 0224  WBC 18.3*  HGB 11.6*  HCT 35.3*  MCV 92.7  PLT 252    Cardiac Enzymes: No results for input(s): "CKTOTAL", "CKMB", "CKMBINDEX", "TROPONINI" in the last 168 hours.  BNP (last 3 results) Recent Labs    06/22/22 0430  BNP 199.6*    ProBNP (last 3 results) No results for input(s): "PROBNP" in the last 8760 hours.  Radiological Exams: DG CHEST PORT 1 VIEW  Result Date: 07/04/2022 CLINICAL DATA:  Respiratory failure EXAM: PORTABLE CHEST 1 VIEW COMPARISON:  Previous studies including the examination of 06/29/2022 FINDINGS: Transverse diameter of heart is increased. Diffuse increase in interstitial markings in both lungs suggest chronic interstitial lung disease and scarring. There is interval improvement in aeration in both lungs, possibly suggesting resolving superimposed interstitial edema. There are no new focal infiltrates. Costophrenic angles are clear. There is no pneumothorax. Pacemaker battery is seen in the left infraclavicular region. IMPRESSION: There is interval decrease in interstitial and alveolar densities in both lungs suggesting resolving pulmonary edema. There is  abnormal residual prominence of interstitial markings in both lungs suggesting chronic interstitial lung disease with scarring. Electronically Signed   By: Elmer Picker M.D.   On: 07/04/2022 10:38    Assessment/Plan Active Problems:   Chronic kidney disease, stage 3b (HCC)   Paroxysmal atrial fibrillation (HCC)   Acute on chronic respiratory failure with hypoxia (HCC)   Pneumonia, viral   Chronic heart failure with preserved ejection fraction  (HFpEF) (HCC)  Acute on chronic respiratory failure with hypoxia -patient has severe hypoxemia secondary to damage from RSV pneumonia.  Patient's last CT was personally reviewed by me shows diffuse groundglass opacities not improving.  Would recommend doing follow-up follow radiological evaluation to see if there is any worsening.  Also I would try to keep on the dry side he does have a history of atrial fibrillation and cardiac issues. Chronic heart failure preserved ejection fraction -we will continue with supportive care monitor fluid status closely diurese as tolerated. Paroxysmal atrial fibrillation -currently rate is controlled we will continue to follow along patient has a pacemaker in place as evidenced by the pacer spikes on telemetry Viral pneumonia -secondary to rhinovirus with residual severe pulmonary damage.  The CT scan that was done last showed almost fibrotic changes we are going to need to monitor these closely and follow-up with them for evolution.  Patient is also on steroids would suggest slowly tapering steroids down Stage IIIb chronic kidney disease -we will continue with supportive care monitor fluid status it will be a fine balance between diuretics for heart failure fluid overload and renal function.   I have personally seen and evaluated the patient, evaluated laboratory and imaging results, formulated the assessment and plan and placed orders. The Patient requires high complexity decision making with multiple systems involvement.  Rounds were done with the Respiratory Therapy Director and Staff therapists and discussed with nursing staff also.  Allyne Gee, MD [facility] Pulmonary Critical Care Medicine Sleep Medicine

## 2022-07-06 ENCOUNTER — Other Ambulatory Visit (HOSPITAL_COMMUNITY): Payer: Medicare Other

## 2022-07-06 LAB — BASIC METABOLIC PANEL
Anion gap: 7 (ref 5–15)
BUN: 40 mg/dL — ABNORMAL HIGH (ref 8–23)
CO2: 29 mmol/L (ref 22–32)
Calcium: 7.7 mg/dL — ABNORMAL LOW (ref 8.9–10.3)
Chloride: 99 mmol/L (ref 98–111)
Creatinine, Ser: 1.61 mg/dL — ABNORMAL HIGH (ref 0.61–1.24)
GFR, Estimated: 41 mL/min — ABNORMAL LOW (ref >60)
Glucose, Bld: 137 mg/dL — ABNORMAL HIGH (ref 70–99)
Potassium: 3.7 mmol/L (ref 3.5–5.1)
Sodium: 135 mmol/L (ref 135–145)

## 2022-07-06 LAB — HEMOGLOBIN A1C
Hgb A1c MFr Bld: 7.1 % — ABNORMAL HIGH (ref 4.8–5.6)
Mean Plasma Glucose: 157.07 mg/dL

## 2022-07-06 LAB — CBC
HCT: 32 % — ABNORMAL LOW (ref 39.0–52.0)
Hemoglobin: 10.8 g/dL — ABNORMAL LOW (ref 13.0–17.0)
MCH: 30.9 pg (ref 26.0–34.0)
MCHC: 33.8 g/dL (ref 30.0–36.0)
MCV: 91.4 fL (ref 80.0–100.0)
Platelets: 164 10*3/uL (ref 150–400)
RBC: 3.5 MIL/uL — ABNORMAL LOW (ref 4.22–5.81)
RDW: 14.9 % (ref 11.5–15.5)
WBC: 25.2 10*3/uL — ABNORMAL HIGH (ref 4.0–10.5)
nRBC: 0 % (ref 0.0–0.2)

## 2022-07-06 LAB — MAGNESIUM: Magnesium: 2.3 mg/dL (ref 1.7–2.4)

## 2022-07-06 NOTE — Progress Notes (Signed)
Pulmonary [place]   PULMONARY CRITICAL CARE SERVICE  PROGRESS NOTE     Ernest Parker  [id number]:[id number]  DOB: 1934/04/16   DOA: 06/13/2022  Referring Physician: Satira Sark, MD  HPI: Ernest Parker is a 88 y.o. male being followed for ventilator/airway/oxygen weaning Acute on Chronic Respiratory Failure.  Patient is currently on high flow oxygen 45 L on 85% FiO2.  Patient's daughter and son-in-law were at the bedside I had the same conversation with them that I had yesterday with the son.  At this stage they do not want him to be intubated but they want everything else done in accordance with the patient's wishes.  I confirmed this with the patient.  I also once again explained to them that this is not imminent however we must be prepared for the worst case scenario.  In addition explained to them that we will be doing a repeat CT scan next week and it will give [place] a better idea of which direction his pulmonary status is going  Medications: Reviewed on Rounds  Physical Exam:  Vitals: Temperature 97.1 pulse 63 respiratory 20 blood pressure is 125/60 saturations 97%  Ventilator Settings high flow oxygen 45 L FiO2 85%  General: Comfortable at this time Neck: supple Cardiovascular: no malignant arrhythmias Respiratory: Scattered rhonchi expansion is equal Skin: no rash seen on limited exam Musculoskeletal: No gross abnormality Psychiatric:unable to assess Neurologic:no involuntary movements         Lab Data:   Basic Metabolic Panel: Recent Labs  Lab 06/30/22 0351 06/25/2022 0435 06/27/2022 1352 07/04/22 0224 07/06/22 0220  NA 139 140  --  139 135  K 4.2 4.7  --  4.0 3.7  CL 102 98  --  101 99  CO2 25 34*  --  28 29  GLUCOSE 205* 223* 451* 159* 137*  BUN 51* 53*  --  55* 40*  CREATININE 1.59* 1.56*  --  1.45* 1.61*  CALCIUM 8.0* 8.3*  --  8.0* 7.7*  MG  --   --   --   --  2.3    ABG: Recent Labs  Lab 07/04/22 0613   PHART 7.48*  PCO2ART 46  PO2ART 82*  HCO3 34.3*  O2SAT 96.9    Liver Function Tests: No results for input(s): "AST", "ALT", "ALKPHOS", "BILITOT", "PROT", "ALBUMIN" in the last 168 hours. No results for input(s): "LIPASE", "AMYLASE" in the last 168 hours. No results for input(s): "AMMONIA" in the last 168 hours.  CBC: Recent Labs  Lab 07/04/22 0224 07/06/22 0220  WBC 18.3* 25.2*  HGB 11.6* 10.8*  HCT 35.3* 32.0*  MCV 92.7 91.4  PLT 252 164    Cardiac Enzymes: No results for input(s): "CKTOTAL", "CKMB", "CKMBINDEX", "TROPONINI" in the last 168 hours.  BNP (last 3 results) Recent Labs    06/22/22 0430  BNP 199.6*    ProBNP (last 3 results) No results for input(s): "PROBNP" in the last 8760 hours.  Radiological Exams: No results found.  Assessment/Plan Active Problems:   Acute on chronic respiratory failure with hypoxia (HCC)   Chronic kidney disease, stage 3b (HCC)   Paroxysmal atrial fibrillation (HCC)   Pneumonia, viral   Chronic heart failure with preserved ejection fraction (HFpEF) (HCC)   Acute on chronic respiratory failure hypoxia plan is going to be able to wean the FiO2 down as saturations were actually very good while he is laying still.  I spoke with the respiratory therapist and to the  family in order to try to keep the saturations equal or greater than 88% Chronic kidney disease stage IIIb we will continue to follow the patient's lab work Paroxysmal atrial fibrillation rate is controlled at this time we will continue to monitor Viral pneumonia severe disease Chronic heart failure preserved ejection fraction we will monitor fluid status very closely so the patient's respiratory status does not decompensate   I have personally seen and evaluated the patient, evaluated laboratory and imaging results, formulated the assessment and plan and placed orders. The Patient requires high complexity decision making with multiple systems involvement.  Rounds  were done with the Respiratory Therapy Director and Staff therapists and discussed with nursing staff also.  Allyne Gee, MD [facility] Pulmonary Critical Care Medicine Sleep Medicine

## 2022-07-07 ENCOUNTER — Other Ambulatory Visit (HOSPITAL_COMMUNITY): Payer: Medicare Other

## 2022-07-07 ENCOUNTER — Ambulatory Visit (HOSPITAL_BASED_OUTPATIENT_CLINIC_OR_DEPARTMENT_OTHER): Payer: Medicare Other

## 2022-07-07 DIAGNOSIS — R52 Pain, unspecified: Secondary | ICD-10-CM

## 2022-07-07 LAB — BLOOD GAS, ARTERIAL
Acid-Base Excess: 12.4 mmol/L — ABNORMAL HIGH (ref 0.0–2.0)
Bicarbonate: 35.8 mmol/L — ABNORMAL HIGH (ref 20.0–28.0)
O2 Saturation: 97 %
Patient temperature: 36.6
pCO2 arterial: 39 mmHg (ref 32–48)
pH, Arterial: 7.57 — ABNORMAL HIGH (ref 7.35–7.45)
pO2, Arterial: 66 mmHg — ABNORMAL LOW (ref 83–108)

## 2022-07-07 NOTE — Progress Notes (Signed)
Pulmonary [place]   PULMONARY CRITICAL CARE SERVICE  PROGRESS NOTE     Ernest Parker  GEX:[id number]  DOB: [birth year]   DOA: 07/06/2022  Referring Physician: Satira Sark, MD  HPI: Ernest Parker is a 88 y.o. male being followed for ventilator/airway/oxygen weaning Acute on Chronic Respiratory Failure.  Patient had a downturn overnight apparently became more hypoxic so he was put on BiPAP by the night team.  The patient's daughter was at the bedside along with the son-in-law this morning and she was upset that she had not been notified that he is on the BiPAP.  I had a very long conversation explaining to her that the downturn is expected she understands this however her main concern was that she had not been called.  I am going to have administration come and speak to her to discuss further expectations etc.  Medications: Reviewed on Rounds  Physical Exam:  Vitals: Temperature 97.8 pulse 78 respiratory 27 blood pressure 145/67 saturations were now 93%  Ventilator Settings currently was on BiPAP 16/8 FiO2 of 100%  General: Comfortable at this time Neck: supple Cardiovascular: no malignant arrhythmias Respiratory: No rhonchi no rales are noted at this time Skin: no rash seen on limited exam Musculoskeletal: No gross abnormality Psychiatric:unable to assess Neurologic:no involuntary movements         Lab Data:   Basic Metabolic Panel: Recent Labs  Lab 06/23/2022 0435 07/04/2022 1352 07/04/22 0224 07/06/22 0220  NA 140  --  139 135  K 4.7  --  4.0 3.7  CL 98  --  101 99  CO2 34*  --  28 29  GLUCOSE 223* 451* 159* 137*  BUN 53*  --  55* 40*  CREATININE 1.56*  --  1.45* 1.61*  CALCIUM 8.3*  --  8.0* 7.7*  MG  --   --   --  2.3    ABG: Recent Labs  Lab 07/04/22 0613 07/07/22 0453  PHART 7.48* 7.57*  PCO2ART 46 39  PO2ART 82* 66*  HCO3 34.3* 35.8*  O2SAT 96.9 97    Liver Function Tests: No results for input(s):  "AST", "ALT", "ALKPHOS", "BILITOT", "PROT", "ALBUMIN" in the last 168 hours. No results for input(s): "LIPASE", "AMYLASE" in the last 168 hours. No results for input(s): "AMMONIA" in the last 168 hours.  CBC: Recent Labs  Lab 07/04/22 0224 07/06/22 0220  WBC 18.3* 25.2*  HGB 11.6* 10.8*  HCT 35.3* 32.0*  MCV 92.7 91.4  PLT 252 164    Cardiac Enzymes: No results for input(s): "CKTOTAL", "CKMB", "CKMBINDEX", "TROPONINI" in the last 168 hours.  BNP (last 3 results) Recent Labs    06/22/22 0430  BNP 199.6*    ProBNP (last 3 results) No results for input(s): "PROBNP" in the last 8760 hours.  Radiological Exams: DG Ankle Complete Right  Result Date: 07/06/2022 CLINICAL DATA:  Pain EXAM: RIGHT ANKLE - COMPLETE 3+ VIEW COMPARISON:  None Available. FINDINGS: No acute fracture or dislocation of the right ankle. Chronic fracture deformity of the distal right fibula with bony callus bridging the distal tibiofibular syndesmosis. Large plantar calcaneal spur. Large medial gutter osteophytes. Soft tissues unremarkable. IMPRESSION: 1. No acute fracture or dislocation of the right ankle. 2. Chronic fracture deformity of the distal right fibula with bony callus bridging the distal tibiofibular syndesmosis. 3. Large medial gutter osteophytes of the ankle mortise. Electronically Signed   By: Delanna Ahmadi M.D.   On: 07/06/2022 13:57  Assessment/Plan Active Problems:   Acute on chronic respiratory failure with hypoxia (HCC)   Chronic kidney disease, stage 3b (HCC)   Paroxysmal atrial fibrillation (HCC)   Pneumonia, viral   Chronic heart failure with preserved ejection fraction (HFpEF) (HCC)   Acute on chronic respiratory failure with hypoxia had a significant downturn overnight.  This morning he actually does look a little bit better.  I spoke with the daughter at length and talked about expectations.  The son-in-law understands also that the situation is not good and they even did discuss  with me the possibility of comfort measures at some point.  I did offer and they agreed to giving him as needed morphine now to help ease the air hunger that he is experiencing Chronic kidney disease stage IIIb we are going to continue to monitor his fluid status. Chronic heart failure preserved ejection fraction supportive care diuresis as tolerated. Viral pneumonia severe disease without really any major improvement we will continue with current measures Paroxysmal atrial fibrillation he is rate controlled at this time we will continue to monitor   I have personally seen and evaluated the patient, evaluated laboratory and imaging results, formulated the assessment and plan and placed orders. The Patient requires high complexity decision making with multiple systems involvement.  Rounds were done with the Respiratory Therapy Director and Staff therapists and discussed with nursing staff also.  Allyne Gee, MD [facility] Pulmonary Critical Care Medicine Sleep Medicine

## 2022-07-07 NOTE — Progress Notes (Signed)
Lower extremity venous duplex has been completed.   Preliminary results in CV Proc.   Jinny Blossom Geovannie Vilar 07/07/2022 11:36 AM

## 2022-07-08 LAB — BASIC METABOLIC PANEL
Anion gap: 16 — ABNORMAL HIGH (ref 5–15)
BUN: 49 mg/dL — ABNORMAL HIGH (ref 8–23)
CO2: 25 mmol/L (ref 22–32)
Calcium: 8.1 mg/dL — ABNORMAL LOW (ref 8.9–10.3)
Chloride: 100 mmol/L (ref 98–111)
Creatinine, Ser: 1.76 mg/dL — ABNORMAL HIGH (ref 0.61–1.24)
GFR, Estimated: 37 mL/min — ABNORMAL LOW (ref >60)
Glucose, Bld: 185 mg/dL — ABNORMAL HIGH (ref 70–99)
Potassium: 3.8 mmol/L (ref 3.5–5.1)
Sodium: 141 mmol/L (ref 135–145)

## 2022-07-08 LAB — CBC
HCT: 39.5 % (ref 39.0–52.0)
Hemoglobin: 12.5 g/dL — ABNORMAL LOW (ref 13.0–17.0)
MCH: 30 pg (ref 26.0–34.0)
MCHC: 31.6 g/dL (ref 30.0–36.0)
MCV: 94.7 fL (ref 80.0–100.0)
Platelets: 66 10*3/uL — ABNORMAL LOW (ref 150–400)
RBC: 4.17 MIL/uL — ABNORMAL LOW (ref 4.22–5.81)
RDW: 15 % (ref 11.5–15.5)
WBC: 23.3 10*3/uL — ABNORMAL HIGH (ref 4.0–10.5)
nRBC: 0 % (ref 0.0–0.2)

## 2022-07-08 LAB — MAGNESIUM: Magnesium: 2.3 mg/dL (ref 1.7–2.4)

## 2022-07-10 LAB — EXPECTORATED SPUTUM ASSESSMENT W GRAM STAIN, RFLX TO RESP C

## 2022-07-10 LAB — CULTURE, RESPIRATORY W GRAM STAIN

## 2022-07-12 DEATH — deceased
